# Patient Record
Sex: Female | Born: 1940
Health system: Southern US, Community
[De-identification: ages and names within clinical notes are randomized; demographics above are authoritative.]

## PROBLEM LIST (undated history)

## (undated) DIAGNOSIS — C801 Malignant (primary) neoplasm, unspecified: Secondary | ICD-10-CM

## (undated) DIAGNOSIS — Z9981 Dependence on supplemental oxygen: Secondary | ICD-10-CM

## (undated) DIAGNOSIS — M1990 Unspecified osteoarthritis, unspecified site: Secondary | ICD-10-CM

## (undated) DIAGNOSIS — I4891 Unspecified atrial fibrillation: Secondary | ICD-10-CM

## (undated) DIAGNOSIS — R011 Cardiac murmur, unspecified: Secondary | ICD-10-CM

## (undated) DIAGNOSIS — I272 Pulmonary hypertension, unspecified: Secondary | ICD-10-CM

## (undated) DIAGNOSIS — D696 Thrombocytopenia, unspecified: Secondary | ICD-10-CM

## (undated) DIAGNOSIS — I499 Cardiac arrhythmia, unspecified: Secondary | ICD-10-CM

## (undated) DIAGNOSIS — J302 Other seasonal allergic rhinitis: Secondary | ICD-10-CM

## (undated) DIAGNOSIS — I509 Heart failure, unspecified: Secondary | ICD-10-CM

## (undated) HISTORY — PX: BREAST LUMPECTOMY: SHX2

## (undated) HISTORY — DX: Pulmonary hypertension, unspecified: I27.20

## (undated) HISTORY — PX: VAGINAL HYSTERECTOMY: SUR661

---

## 1998-02-11 ENCOUNTER — Ambulatory Visit (HOSPITAL_COMMUNITY): Admission: RE | Admit: 1998-02-11 | Discharge: 1998-02-11 | Payer: Self-pay | Admitting: Endocrinology

## 1998-02-15 ENCOUNTER — Ambulatory Visit (HOSPITAL_COMMUNITY): Admission: RE | Admit: 1998-02-15 | Discharge: 1998-02-15 | Payer: Self-pay | Admitting: Endocrinology

## 1998-07-15 ENCOUNTER — Other Ambulatory Visit: Admission: RE | Admit: 1998-07-15 | Discharge: 1998-07-15 | Payer: Self-pay | Admitting: Endocrinology

## 1999-07-02 ENCOUNTER — Ambulatory Visit (HOSPITAL_COMMUNITY): Admission: RE | Admit: 1999-07-02 | Discharge: 1999-07-02 | Payer: Self-pay | Admitting: Endocrinology

## 1999-07-02 ENCOUNTER — Encounter: Payer: Self-pay | Admitting: Endocrinology

## 1999-07-24 ENCOUNTER — Other Ambulatory Visit: Admission: RE | Admit: 1999-07-24 | Discharge: 1999-07-24 | Payer: Self-pay | Admitting: Endocrinology

## 2000-07-28 ENCOUNTER — Other Ambulatory Visit: Admission: RE | Admit: 2000-07-28 | Discharge: 2000-07-28 | Payer: Self-pay | Admitting: Endocrinology

## 2002-01-10 ENCOUNTER — Encounter: Payer: Self-pay | Admitting: Endocrinology

## 2002-01-10 ENCOUNTER — Ambulatory Visit (HOSPITAL_COMMUNITY): Admission: RE | Admit: 2002-01-10 | Discharge: 2002-01-10 | Payer: Self-pay | Admitting: Endocrinology

## 2002-01-12 ENCOUNTER — Encounter: Payer: Self-pay | Admitting: Endocrinology

## 2002-01-12 ENCOUNTER — Encounter: Admission: RE | Admit: 2002-01-12 | Discharge: 2002-01-12 | Payer: Self-pay | Admitting: Endocrinology

## 2002-06-29 HISTORY — PX: NM MYOCAR PERF WALL MOTION: HXRAD629

## 2005-05-01 ENCOUNTER — Ambulatory Visit (HOSPITAL_COMMUNITY): Admission: RE | Admit: 2005-05-01 | Discharge: 2005-05-01 | Payer: Self-pay | Admitting: *Deleted

## 2006-05-24 ENCOUNTER — Ambulatory Visit (HOSPITAL_COMMUNITY): Admission: RE | Admit: 2006-05-24 | Discharge: 2006-05-24 | Payer: Self-pay | Admitting: Endocrinology

## 2006-08-10 ENCOUNTER — Inpatient Hospital Stay (HOSPITAL_COMMUNITY): Admission: EM | Admit: 2006-08-10 | Discharge: 2006-08-17 | Payer: Self-pay | Admitting: Emergency Medicine

## 2006-08-12 HISTORY — PX: CARDIAC CATHETERIZATION: SHX172

## 2007-04-04 ENCOUNTER — Encounter: Admission: RE | Admit: 2007-04-04 | Discharge: 2007-05-10 | Payer: Self-pay

## 2010-11-14 NOTE — Op Note (Signed)
NAME:  Vicki [name], CUNNING NO.:  [id number]   MEDICAL RECORD NO.:  [id number]          PATIENT TYPE:  AMB   LOCATION:  ENDO                         FACILITY:  [facility]   PHYSICIAN:  Waverly Ferrari, M.D.    DATE OF BIRTH:  [birth year]   DATE OF PROCEDURE:  05/01/2005  DATE OF DISCHARGE:                                 OPERATIVE REPORT   PROCEDURE:  Colonoscopy.   INDICATIONS:  Colon polyp, rectal bleeding.   ANESTHESIA:  1.  Demerol 50 mg.  2.  Versed 5 mg.   DESCRIPTION OF PROCEDURE:  With patient mildly sedated in the left lateral  decubitus position, the Olympus videoscopic colonoscope was inserted in the  rectum and with pressure applied to the abdomen, we reached the cecum,  identified by ileocecal valve and appendiceal orifice, both of which were  photographed.  From this point, the colonoscope was slowly withdrawn, taking  circumferential views of colonic mucosa, stopping in the rectum which  appeared normal on direct and retroflexed view.  The endoscope was  straightened, withdrawn.  The patient's vital signs and pulse oximeter  remained stable.  The patient tolerated the procedure well without apparent  complications.   FINDINGS:  Occasional diverticula in the sigmoid colon.  Otherwise, an  unremarkable examination.   PLAN:  Consider repeat examination possibly in 5 years.           ______________________________  Waverly Ferrari, M.D.     GMO/MEDQ  D:  05/01/2005  T:  05/01/2005  Job:  [id number]

## 2010-11-14 NOTE — Cardiovascular Report (Signed)
NAME:  Vicki [name], Vicki [name] NO.:  [id number]   MEDICAL RECORD NO.:  [id number]          PATIENT TYPE:  INP   LOCATION:  4703                         FACILITY:  [facility]   PHYSICIAN:  Jeanella Craze. Little, M.D. DATE OF BIRTH:  [birth year]   DATE OF PROCEDURE:  08/12/2006  DATE OF DISCHARGE:                            CARDIAC CATHETERIZATION   INDICATIONS FOR TEST:  This 70 year old female has chronic atrial  fibrillation and known left ventricular hypertrophy.  She was admitted  with chest pain, has ruled out for myocardial infarction and is brought  to the catheterization lab for cardiac catheterization.  Because of her  atrial fibrillation, we had to wait until INR became less than 1.7 to  safely catheterize her.   After obtaining informed consent, the patient was prepped and draped in  the usual sterile fashion exposing the right groin.  Applying local  anesthetic with 1% Xylocaine, the Seldinger technique was employed, and  a 5-French introducer sheath was placed into the right femoral artery.  Left and right coronary arteriography and ventriculography in the RAO  projection was performed.   COMPLICATIONS:  None.   TOTAL CONTRAST:  90 mL.   EQUIPMENT:  5-French Judkins configuration catheters.   RESULTS:  HEMODYNAMIC MONITORING:  Central aortic pressure was 136/72.  Left ventricular pressure was 137/-1.  There was no valve gradient noted  at the time of pullback.   VENTRICULOGRAPHY:  Ventriculography in the RAO projection using 20 mL of  contrast at 12 mL per second revealed severe left ventricular  hypertrophy with a small LV cavity.  There was not obliteration of the  proximal cavity, but the entire LV cavity was almost completely  obliterated.  The ejection fraction was well in excess of 70%, and the  left ventricular end-diastolic pressure was only 8.   CORONARY ARTERIOGRAPHY:  On fluoroscopy, there was no calcification  noted.   1. Left main:  Normal.  It  trifurcated.  2. Optional diagonal:  This was a 4+ mm vessel.  It was large and free      of disease.  3. Circumflex.  The circumflex was around a 3.5-mm vessel that gave      rise to about a 3-mm small OM vessel.  It was free of disease.  4. Left anterior descending:  The LAD extended down to the apex of the      heart and gave rise to large first diagonal vessel.  Both the LAD      and the first diagonal were free of disease and around 4 mm in      diameter.  The right coronary artery was a large dominant vessel      with a very large PDA and posterior lateral vessel, all of which      were free of disease.  The proximal RCA was at least 6 mm in      diameter, and the distal portion was at least 5 mm.   CONCLUSION:  1. No evidence of occlusive coronary disease.  2. Marked left ventricular hypertrophy consistent with a hypertrophic  myopathy.   The patient is in chronic atrial fibrillation.  She will be restarted on  her Coumadin and restarted on Lovenox.  If the patient's insurance will  allow and the patient can be educated on how to give herself  subcutaneous Lovenox, she could be discharged to home later today with  follow-up by Dr. Pauline Aus from a Coumadin standpoint.   I did discuss with Vicki [name] the possibility that her LVH could be  familial and suggested she have her family, particularly her children  checked, to make sure they do not have hypertrophic myopathy.           ______________________________  Jeanella Craze Little, M.D.     ABL/MEDQ  D:  08/12/2006  T:  08/12/2006  Job:  [id number]   cc:   Ronaldo Miyamoto, M.D.  Freeman Caldron. Pauline Aus, M.D.  Jeanella Craze. Little, M.D.  Catheterization Lab

## 2010-11-14 NOTE — Discharge Summary (Signed)
NAME:  Vicki [name], Vicki [name] NO.:  [id number]   MEDICAL RECORD NO.:  [id number]          PATIENT TYPE:  INP   LOCATION:  4703                         FACILITY:  [facility]   PHYSICIAN:  Freeman Caldron. Pauline Aus, M.D.   DATE OF BIRTH:  December 29, 1940   DATE OF ADMISSION:  08/10/2006  DATE OF DISCHARGE:  08/17/2006                               DISCHARGE SUMMARY   This 70 year old woman presented to the office with a chief complaint of  recurrent chest pain associated with physical exertion, and much more  frequent over the week or so prior to hospitalization.  She was noted to  have a markedly abnormal electrocardiogram, and urgent hospitalization  was considered appropriate.  On the electrocardiogram, she had an  intraventricular conduction delay, left ventricular hypertrophy by  voltage criteria, and marked T wave inversions in the upper limb leads  and lateral precordial leads, both of which were considerably more  prominent than had been the case in previous EKGs.   PAST MEDICAL FAMILY AND SOCIAL HISTORY:  Are described in the History &  Physical.  She has no use of tobacco.   PHYSICAL EXAM:  VITAL SIGNS:  Blood pressure 100/64, pulse 80 and  regular, respirations 16 and unlabored, weight 185 pounds.  She was a  well-developed, well-nourished, late middle-aged woman in no acute  distress, moderately overweight.  She is oriented to person, place and  time.  GENERAL PHYSICAL EXAM:  Unremarkable.  LUNGS:  Clear to auscultation and percussion.  Her cardiac apical  impulse was cryptic.  The left border of cardiac dullness was obscure.  HEART RHYTHM:  Irregularly regular.  There was no gallop, click, or  murmur.   INITIAL LABORATORY DATA:  Comprehensive metabolic panel shows a  potassium of 3.4, creatinine 0.6, otherwise unremarkable.  Hemoglobin  12.7, hematocrit 38, mean cell volume 92, platelets 240,000.  The  differential white count was unremarkable.  CK total 239, MB band 8.4,  troponin 0.05, prothrombin INR 2.2.  Urinalysis was unremarkable,  myoglobin 101.  The electrocardiogram showed atrial fibrillation (she  has known chronic atrial fibrillation), with left axis deviation, minor  intraventricular conduction delay, left ventricular hypertrophy by  voltage criteria, and the more marked T wave abnormality noted above.   COURSE IN HOSPITAL:  She was admitted to a telemetry bed.  She was seen  in consultation by [facility], who agreed that the  cardiac catheterization was appropriate in light of her markedly  accelerated symptoms.  Because of her prolonged prothrombin time,  cardiac catheterization had to be delayed by several days.  On August 12, 2006, cardiac catheterization was done by Dr. Rex Kras.  The results  were as follows:  1) Left main, normal with trifurcation, 2) Optional  diagonal was a 4 mm plus vessel, large and free of disease, 3)  Circumflex, the circumflex was a 3.5 mm vessel giving rise to a 3.3 mm  small obtuse marginal vessel.  It was free of disease.  Left anterior  descending extended to the apex of the heart and gave rise to  a large  first diagonal vessel,  both the LAD and the first diagonal were free of  disease and about 4 mm in diameter.  The right coronary was a large  dominant vessel with a very large posterior descending and  posterolateral vessel, all free of disease.  The proximal RCA was at  least 6 mm in diameter and the distal at least 5.  Dr. Rex Kras also noted  that the patient had marked left ventricular hypertrophy consistent with  a hypertrophic cardiomyopathy, but no evidence of dynamic obstruction.   Following the cardiac catheterization, the patient had slow response to  Coumadin in terms of getting her prothrombin time back to the  therapeutic level.  She also had 1-2 episodes of rapid heart beat, still  in atrial fibrillation, which appeared to reproduce the symptoms she had  had prior to  admission.  Because of this, her metoprolol dose was  increased to the maximum tolerable dose of 125 mg twice a day.  She did  have occasional pauses of up to 2.5 seconds, but these were  asymptomatic.  Her resting electrocardiogram demonstrated QT  prolongation, and some more active antiarrhythmic drugs were considered  to be at least relatively contraindicated.   A discussion was held with the patient to the effect that she may  eventually need a pacemaker if we are unable to control her symptoms of  rapid heart rate without producing excessive bradycardia.   By the day of discharge, August 17, 2006, her prothrombin time had  finally reached therapeutic level of INR 2, and she was considered ready  for discharge.   FINAL DIAGNOSES:  1. Recurrent chest pain, apparently due to hypertrophic cardiomyopathy      and atrial fibrillation with rapid rate, with normal coronary      arteries on cardiac catheterization.  2. Atrial fibrillation.  3. Hypertension.  4. Mild anemia noted during hospitalization.  Her initial hemoglobin      of 12.7 fell to 11.6, where it remained stable.   CONDITION ON DISCHARGE:  Improved.   RETURN TO WORK:  She may return to work in a week.   RESTRICTIONS:  She is not to do things which cause her to be short of  breath or feel as though she has a fast heart rate.  She can walk as  tolerated.   MEDICATIONS ON DISCHARGE:  1. Altace 10 mg daily.  2. Coumadin 5 mg daily and 7.5 on Tuesdays.  3. Exforge 10/320 one daily.  4. Lopressor 100 plus 25 mg twice a day.  5. Crestor 10 mg daily.  6. Hydrochlorothiazide 25 mg daily.   OPERATIONS:  Cardiac catheterization, as noted.   COMPLICATIONS:  None.   CONDITION ON DISCHARGE:  Stable and improved.   FOLLOWUP:  1. Dr. Pauline Aus in 2 weeks.  2. Dr. Wilson Singer, her primary physician, to be arranged.           ______________________________  Freeman Caldron Pauline Aus, M.D.    DDG/MEDQ  D:  08/17/2006  T:  08/17/2006   Job:  [id number]   cc:   Ronaldo Miyamoto, M.D.

## 2011-07-07 DIAGNOSIS — H2510 Age-related nuclear cataract, unspecified eye: Secondary | ICD-10-CM | POA: Diagnosis not present

## 2011-07-07 DIAGNOSIS — H40029 Open angle with borderline findings, high risk, unspecified eye: Secondary | ICD-10-CM | POA: Diagnosis not present

## 2011-07-27 DIAGNOSIS — Z7901 Long term (current) use of anticoagulants: Secondary | ICD-10-CM | POA: Diagnosis not present

## 2011-07-27 DIAGNOSIS — I4891 Unspecified atrial fibrillation: Secondary | ICD-10-CM | POA: Diagnosis not present

## 2011-07-28 DIAGNOSIS — I10 Essential (primary) hypertension: Secondary | ICD-10-CM | POA: Diagnosis not present

## 2011-07-28 DIAGNOSIS — I4891 Unspecified atrial fibrillation: Secondary | ICD-10-CM | POA: Diagnosis not present

## 2011-08-17 DIAGNOSIS — E789 Disorder of lipoprotein metabolism, unspecified: Secondary | ICD-10-CM | POA: Diagnosis not present

## 2011-08-24 DIAGNOSIS — R092 Respiratory arrest: Secondary | ICD-10-CM | POA: Diagnosis not present

## 2011-08-24 DIAGNOSIS — E789 Disorder of lipoprotein metabolism, unspecified: Secondary | ICD-10-CM | POA: Diagnosis not present

## 2011-08-24 DIAGNOSIS — I10 Essential (primary) hypertension: Secondary | ICD-10-CM | POA: Diagnosis not present

## 2011-08-27 DIAGNOSIS — I4891 Unspecified atrial fibrillation: Secondary | ICD-10-CM | POA: Diagnosis not present

## 2011-08-27 DIAGNOSIS — I509 Heart failure, unspecified: Secondary | ICD-10-CM | POA: Diagnosis not present

## 2011-08-27 DIAGNOSIS — Z7901 Long term (current) use of anticoagulants: Secondary | ICD-10-CM | POA: Diagnosis not present

## 2011-09-24 DIAGNOSIS — Z7901 Long term (current) use of anticoagulants: Secondary | ICD-10-CM | POA: Diagnosis not present

## 2011-09-24 DIAGNOSIS — I4891 Unspecified atrial fibrillation: Secondary | ICD-10-CM | POA: Diagnosis not present

## 2011-10-05 DIAGNOSIS — I10 Essential (primary) hypertension: Secondary | ICD-10-CM | POA: Diagnosis not present

## 2011-10-05 DIAGNOSIS — E119 Type 2 diabetes mellitus without complications: Secondary | ICD-10-CM | POA: Diagnosis not present

## 2011-10-05 DIAGNOSIS — R05 Cough: Secondary | ICD-10-CM | POA: Diagnosis not present

## 2011-10-05 DIAGNOSIS — R059 Cough, unspecified: Secondary | ICD-10-CM | POA: Diagnosis not present

## 2011-10-05 DIAGNOSIS — E789 Disorder of lipoprotein metabolism, unspecified: Secondary | ICD-10-CM | POA: Diagnosis not present

## 2011-10-27 DIAGNOSIS — I10 Essential (primary) hypertension: Secondary | ICD-10-CM | POA: Diagnosis not present

## 2011-10-27 DIAGNOSIS — I4891 Unspecified atrial fibrillation: Secondary | ICD-10-CM | POA: Diagnosis not present

## 2011-10-27 DIAGNOSIS — Z7901 Long term (current) use of anticoagulants: Secondary | ICD-10-CM | POA: Diagnosis not present

## 2011-11-10 DIAGNOSIS — I4891 Unspecified atrial fibrillation: Secondary | ICD-10-CM | POA: Diagnosis not present

## 2011-11-10 DIAGNOSIS — I10 Essential (primary) hypertension: Secondary | ICD-10-CM | POA: Diagnosis not present

## 2011-11-10 DIAGNOSIS — Z7901 Long term (current) use of anticoagulants: Secondary | ICD-10-CM | POA: Diagnosis not present

## 2011-12-15 DIAGNOSIS — Z7901 Long term (current) use of anticoagulants: Secondary | ICD-10-CM | POA: Diagnosis not present

## 2011-12-15 DIAGNOSIS — I10 Essential (primary) hypertension: Secondary | ICD-10-CM | POA: Diagnosis not present

## 2011-12-15 DIAGNOSIS — I4891 Unspecified atrial fibrillation: Secondary | ICD-10-CM | POA: Diagnosis not present

## 2011-12-15 DIAGNOSIS — E789 Disorder of lipoprotein metabolism, unspecified: Secondary | ICD-10-CM | POA: Diagnosis not present

## 2011-12-22 DIAGNOSIS — R0989 Other specified symptoms and signs involving the circulatory and respiratory systems: Secondary | ICD-10-CM | POA: Diagnosis not present

## 2011-12-22 DIAGNOSIS — R0609 Other forms of dyspnea: Secondary | ICD-10-CM | POA: Diagnosis not present

## 2011-12-22 DIAGNOSIS — I10 Essential (primary) hypertension: Secondary | ICD-10-CM | POA: Diagnosis not present

## 2011-12-22 DIAGNOSIS — I4891 Unspecified atrial fibrillation: Secondary | ICD-10-CM | POA: Diagnosis not present

## 2011-12-22 DIAGNOSIS — E789 Disorder of lipoprotein metabolism, unspecified: Secondary | ICD-10-CM | POA: Diagnosis not present

## 2012-01-14 DIAGNOSIS — I10 Essential (primary) hypertension: Secondary | ICD-10-CM | POA: Diagnosis not present

## 2012-01-14 DIAGNOSIS — I4891 Unspecified atrial fibrillation: Secondary | ICD-10-CM | POA: Diagnosis not present

## 2012-01-14 DIAGNOSIS — R609 Edema, unspecified: Secondary | ICD-10-CM | POA: Diagnosis not present

## 2012-01-18 DIAGNOSIS — I10 Essential (primary) hypertension: Secondary | ICD-10-CM | POA: Diagnosis not present

## 2012-01-18 DIAGNOSIS — I4891 Unspecified atrial fibrillation: Secondary | ICD-10-CM | POA: Diagnosis not present

## 2012-01-18 DIAGNOSIS — Z7901 Long term (current) use of anticoagulants: Secondary | ICD-10-CM | POA: Diagnosis not present

## 2012-02-18 DIAGNOSIS — I10 Essential (primary) hypertension: Secondary | ICD-10-CM | POA: Diagnosis not present

## 2012-02-18 DIAGNOSIS — I4891 Unspecified atrial fibrillation: Secondary | ICD-10-CM | POA: Diagnosis not present

## 2012-02-18 DIAGNOSIS — Z7901 Long term (current) use of anticoagulants: Secondary | ICD-10-CM | POA: Diagnosis not present

## 2012-02-18 DIAGNOSIS — I509 Heart failure, unspecified: Secondary | ICD-10-CM | POA: Diagnosis not present

## 2012-03-21 DIAGNOSIS — I10 Essential (primary) hypertension: Secondary | ICD-10-CM | POA: Diagnosis not present

## 2012-03-21 DIAGNOSIS — I4891 Unspecified atrial fibrillation: Secondary | ICD-10-CM | POA: Diagnosis not present

## 2012-03-21 DIAGNOSIS — Z7901 Long term (current) use of anticoagulants: Secondary | ICD-10-CM | POA: Diagnosis not present

## 2012-03-21 DIAGNOSIS — I509 Heart failure, unspecified: Secondary | ICD-10-CM | POA: Diagnosis not present

## 2012-04-04 DIAGNOSIS — Z7901 Long term (current) use of anticoagulants: Secondary | ICD-10-CM | POA: Diagnosis not present

## 2012-04-04 DIAGNOSIS — I4891 Unspecified atrial fibrillation: Secondary | ICD-10-CM | POA: Diagnosis not present

## 2012-04-04 DIAGNOSIS — I509 Heart failure, unspecified: Secondary | ICD-10-CM | POA: Diagnosis not present

## 2012-04-04 DIAGNOSIS — I10 Essential (primary) hypertension: Secondary | ICD-10-CM | POA: Diagnosis not present

## 2012-04-18 DIAGNOSIS — I10 Essential (primary) hypertension: Secondary | ICD-10-CM | POA: Diagnosis not present

## 2012-04-18 DIAGNOSIS — I509 Heart failure, unspecified: Secondary | ICD-10-CM | POA: Diagnosis not present

## 2012-04-18 DIAGNOSIS — E789 Disorder of lipoprotein metabolism, unspecified: Secondary | ICD-10-CM | POA: Diagnosis not present

## 2012-04-25 DIAGNOSIS — I10 Essential (primary) hypertension: Secondary | ICD-10-CM | POA: Diagnosis not present

## 2012-04-25 DIAGNOSIS — J9600 Acute respiratory failure, unspecified whether with hypoxia or hypercapnia: Secondary | ICD-10-CM | POA: Diagnosis not present

## 2012-04-25 DIAGNOSIS — Z23 Encounter for immunization: Secondary | ICD-10-CM | POA: Diagnosis not present

## 2012-04-25 DIAGNOSIS — E789 Disorder of lipoprotein metabolism, unspecified: Secondary | ICD-10-CM | POA: Diagnosis not present

## 2012-04-27 DIAGNOSIS — H6120 Impacted cerumen, unspecified ear: Secondary | ICD-10-CM | POA: Diagnosis not present

## 2012-05-09 DIAGNOSIS — I4891 Unspecified atrial fibrillation: Secondary | ICD-10-CM | POA: Diagnosis not present

## 2012-05-09 DIAGNOSIS — I10 Essential (primary) hypertension: Secondary | ICD-10-CM | POA: Diagnosis not present

## 2012-05-09 DIAGNOSIS — I509 Heart failure, unspecified: Secondary | ICD-10-CM | POA: Diagnosis not present

## 2012-05-09 DIAGNOSIS — Z7901 Long term (current) use of anticoagulants: Secondary | ICD-10-CM | POA: Diagnosis not present

## 2012-05-25 DIAGNOSIS — D696 Thrombocytopenia, unspecified: Secondary | ICD-10-CM | POA: Diagnosis not present

## 2012-06-02 DIAGNOSIS — D696 Thrombocytopenia, unspecified: Secondary | ICD-10-CM | POA: Diagnosis not present

## 2012-06-03 ENCOUNTER — Telehealth: Payer: Self-pay | Admitting: Hematology and Oncology

## 2012-06-03 ENCOUNTER — Telehealth: Payer: Self-pay | Admitting: Internal Medicine

## 2012-06-03 NOTE — Telephone Encounter (Signed)
S/W pt in re NP appt 12/09 @ 9:30 w/Dr. Julien Nordmann.  Referring Dr. Wilson Singer  Dx-Thrombocytopenia Welcome Packet at registration

## 2012-06-03 NOTE — Telephone Encounter (Signed)
LVOM for pt to return call.

## 2012-06-06 ENCOUNTER — Other Ambulatory Visit: Payer: Self-pay | Admitting: Lab

## 2012-06-06 ENCOUNTER — Telehealth: Payer: Self-pay | Admitting: Internal Medicine

## 2012-06-06 ENCOUNTER — Ambulatory Visit: Payer: Self-pay | Admitting: Internal Medicine

## 2012-06-06 ENCOUNTER — Ambulatory Visit: Payer: Self-pay

## 2012-06-06 NOTE — Telephone Encounter (Signed)
C/D 06/06/12 for appt.06/13/12

## 2012-06-09 DIAGNOSIS — I10 Essential (primary) hypertension: Secondary | ICD-10-CM | POA: Diagnosis not present

## 2012-06-09 DIAGNOSIS — Z7901 Long term (current) use of anticoagulants: Secondary | ICD-10-CM | POA: Diagnosis not present

## 2012-06-09 DIAGNOSIS — I509 Heart failure, unspecified: Secondary | ICD-10-CM | POA: Diagnosis not present

## 2012-06-09 DIAGNOSIS — I4891 Unspecified atrial fibrillation: Secondary | ICD-10-CM | POA: Diagnosis not present

## 2012-06-13 ENCOUNTER — Other Ambulatory Visit (HOSPITAL_BASED_OUTPATIENT_CLINIC_OR_DEPARTMENT_OTHER): Payer: Medicare Other | Admitting: Lab

## 2012-06-13 ENCOUNTER — Ambulatory Visit: Payer: Medicare Other

## 2012-06-13 ENCOUNTER — Encounter: Payer: Self-pay | Admitting: Internal Medicine

## 2012-06-13 ENCOUNTER — Ambulatory Visit (HOSPITAL_BASED_OUTPATIENT_CLINIC_OR_DEPARTMENT_OTHER): Payer: Medicare Other | Admitting: Internal Medicine

## 2012-06-13 VITALS — BP 112/71 | HR 83 | Temp 97.2°F | Resp 22 | Ht 62.0 in | Wt 175.5 lb

## 2012-06-13 DIAGNOSIS — D696 Thrombocytopenia, unspecified: Secondary | ICD-10-CM

## 2012-06-13 LAB — CBC WITH DIFFERENTIAL/PLATELET
BASO%: 0.4 % (ref 0.0–2.0)
Basophils Absolute: 0 10*3/uL (ref 0.0–0.1)
EOS%: 0.8 % (ref 0.0–7.0)
Eosinophils Absolute: 0 10*3/uL (ref 0.0–0.5)
HCT: 45.1 % (ref 34.8–46.6)
HGB: 14.3 g/dL (ref 11.6–15.9)
LYMPH%: 19 % (ref 14.0–49.7)
MCH: 30.4 pg (ref 25.1–34.0)
MCHC: 31.7 g/dL (ref 31.5–36.0)
MCV: 96 fL (ref 79.5–101.0)
MONO#: 0.4 10*3/uL (ref 0.1–0.9)
MONO%: 6.6 % (ref 0.0–14.0)
NEUT#: 3.9 10*3/uL (ref 1.5–6.5)
NEUT%: 73.2 % (ref 38.4–76.8)
Platelets: 133 10*3/uL — ABNORMAL LOW (ref 145–400)
RBC: 4.7 10*6/uL (ref 3.70–5.45)
RDW: 16.7 % — ABNORMAL HIGH (ref 11.2–14.5)
WBC: 5.3 10*3/uL (ref 3.9–10.3)
lymph#: 1 10*3/uL (ref 0.9–3.3)
nRBC: 0 % (ref 0–0)

## 2012-06-13 LAB — COMPREHENSIVE METABOLIC PANEL (CC13)
ALT: 21 U/L (ref 0–55)
Albumin: 3.3 g/dL — ABNORMAL LOW (ref 3.5–5.0)
CO2: 29 mEq/L (ref 22–29)
Chloride: 105 mEq/L (ref 98–107)
Glucose: 139 mg/dl — ABNORMAL HIGH (ref 70–99)
Potassium: 3.5 mEq/L (ref 3.5–5.1)
Sodium: 142 mEq/L (ref 136–145)
Total Protein: 6.8 g/dL (ref 6.4–8.3)

## 2012-06-13 LAB — LACTATE DEHYDROGENASE (CC13): LDH: 360 U/L — ABNORMAL HIGH (ref 125–245)

## 2012-06-13 NOTE — Progress Notes (Signed)
[place] [phone]   [phone]  CONSULT NOTE   REFERRING PHYSICIAN: Anda Kraft, MD  REASON FOR CONSULTATION: Thrombocytopenia.  HPI Vicki [name] is a 71 y.o. female with past medical history significant for hypertension, congestive heart failure, dyslipidemia and atrial fibrillation. The patient is currently on Coumadin for her atrial fibrillation. She was seen recently by her primary care physician Dr. Wilson Singer for routine evaluation and blood work. CBC on 04/18/2012 showed platelets count was low at 116,000. Repeat CBC on 05/25/2012 showed low platelets count of 83,000. CBC was again repeated on 06/02/2012 and showed platelets count to over 59,000. He referred the patient to me today for evaluation of this abnormality. The patient is feeling fine and she has no significant complaints except for shortness of breath at baseline and increased with exertion secondary to congestive heart failure. She denied having any bleeding issues, bruises or ecchymosis. She has no weight loss or night sweats. She does take some over-the-counter herbal medications for sleep and multivitamins. There is no recent change in her medications. She has been on Crestor for almost 7 years but she has been using more Lasix recently because of the congestive heart failure.  The patient mentions that she has low platelets for more than 10 years. The patient has no family history of thrombocytopenia or any other blood disease.  Past medical history: Significant for atrial fibrillation, dyslipidemia, hypertension, congestive heart failure.  Family history: Mother died from on Zymar and father from heart disease.  Social History: The patient is married and has 3 children. 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 She has no history of smoking but drinks alcohol occasionally and no history of drug abuse.   No Known Allergies  Current Outpatient  Prescriptions  Medication Sig Dispense Refill  . amLODipine (NORVASC) 10 MG tablet Take 10 mg by mouth daily.      . furosemide (LASIX) 40 MG tablet Take 40 mg by mouth daily.      Marland Kitchen losartan (COZAAR) 100 MG tablet Take 100 mg by mouth daily.      . metoprolol (LOPRESSOR) 100 MG tablet Take 100 mg by mouth 2 (two) times daily.      . potassium chloride (K-DUR) 10 MEQ tablet Take 20 mEq by mouth daily.      . ramipril (ALTACE) 10 MG capsule Take 10 mg by mouth daily.      . rosuvastatin (CRESTOR) 20 MG tablet Take 20 mg by mouth daily.      Marland Kitchen warfarin (COUMADIN) 5 MG tablet Take 5 mg by mouth daily. As directed by PCP        Review of Systems  A comprehensive review of systems was negative except for: Respiratory: positive for dyspnea on exertion  Physical Exam  QDI:YMEBR, healthy, no distress, well nourished and well developed SKIN: skin color, texture, turgor are normal HEAD: Normocephalic EYES: normal, PERRLA EARS: External ears normal OROPHARYNX:no exudate and no erythema  NECK: supple, no adenopathy LYMPH:  no palpable lymphadenopathy, no hepatosplenomegaly BREAST:not examined LUNGS: clear to auscultation  HEART: regular rate & rhythm and no murmurs ABDOMEN:abdomen soft, non-tender, obese, normal bowel sounds and no masses or organomegaly BACK: Back symmetric, no curvature. EXTREMITIES:no joint deformities, effusion, or inflammation, no edema, no skin discoloration  NEURO: alert & oriented x 3 with fluent speech, no focal motor/sensory deficits  PERFORMANCE STATUS: ECOG 1  LABORATORY DATA: Lab Results  Component Value Date   WBC 5.3 06/13/2012   HGB 14.3 06/13/2012   HCT 45.1 06/13/2012   MCV 96.0 06/13/2012   PLT 133 Giant platelets present* 06/13/2012      Chemistry      Component Value Date/Time   NA 142 06/13/2012 1345   K 3.5 06/13/2012 1345   CL 105 06/13/2012 1345   CO2 29 06/13/2012 1345   BUN 16.0 06/13/2012 1345   CREATININE 1.0 06/13/2012 1345       Component Value Date/Time   CALCIUM 8.9 06/13/2012 1345   ALKPHOS 45 06/13/2012 1345   AST 34 06/13/2012 1345   ALT 21 06/13/2012 1345   BILITOT 1.69* 06/13/2012 1345       RADIOGRAPHIC STUDIES: No results found.  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 Her platelets has been up and down for the last 10 years but the patient is asymptomatic with no bleeding, bruises or ecchymosis.  Her CBC today showed platelets count of 133,000 with giant platelets.  PLAN: I discussed the lab result with the patient today. I recommended for her to continue on observation with her primary care physician. I don't see a need for any further intervention at this point. I recommended for the patient to call me immediately if she has platelets count less than 50,000 or if she has any significant bleeding, bruises or ecchymosis. The patient agreed to the current plan.  All questions were answered. The patient knows to call the clinic with any problems, questions or concerns. We can certainly see the patient much sooner if necessary.  Thank you so much for allowing me to participate in the care of Vicki [name]. I will continue to follow up the patient with you and assist in her care.  I spent 25 minutes counseling the patient face to face. The total time spent in the appointment was 50 minutes.  Hawkins Seaman K. 06/13/2012, 4:31 PM

## 2012-06-13 NOTE — Patient Instructions (Signed)
Your CBC today showed mildly decreased platelets count. This is most likely drug-induced plus/minus ITP. Continue observation by your family physician. Call back if you have any platelets count less than 50,000 or you have any significant bleeding, bruises or ecchymosis

## 2012-06-13 NOTE — Progress Notes (Signed)
Checked in new patient. No financial issues.

## 2012-07-07 DIAGNOSIS — Z7901 Long term (current) use of anticoagulants: Secondary | ICD-10-CM | POA: Diagnosis not present

## 2012-07-07 DIAGNOSIS — I4891 Unspecified atrial fibrillation: Secondary | ICD-10-CM | POA: Diagnosis not present

## 2012-07-07 DIAGNOSIS — I10 Essential (primary) hypertension: Secondary | ICD-10-CM | POA: Diagnosis not present

## 2012-07-12 DIAGNOSIS — H2510 Age-related nuclear cataract, unspecified eye: Secondary | ICD-10-CM | POA: Diagnosis not present

## 2012-07-12 DIAGNOSIS — H40029 Open angle with borderline findings, high risk, unspecified eye: Secondary | ICD-10-CM | POA: Diagnosis not present

## 2012-07-13 DIAGNOSIS — I422 Other hypertrophic cardiomyopathy: Secondary | ICD-10-CM | POA: Diagnosis not present

## 2012-07-13 DIAGNOSIS — I4891 Unspecified atrial fibrillation: Secondary | ICD-10-CM | POA: Diagnosis not present

## 2012-07-13 DIAGNOSIS — I5031 Acute diastolic (congestive) heart failure: Secondary | ICD-10-CM | POA: Diagnosis not present

## 2012-07-13 DIAGNOSIS — I10 Essential (primary) hypertension: Secondary | ICD-10-CM | POA: Diagnosis not present

## 2012-07-26 DIAGNOSIS — R05 Cough: Secondary | ICD-10-CM | POA: Diagnosis not present

## 2012-07-26 DIAGNOSIS — I10 Essential (primary) hypertension: Secondary | ICD-10-CM | POA: Diagnosis not present

## 2012-07-26 DIAGNOSIS — R059 Cough, unspecified: Secondary | ICD-10-CM | POA: Diagnosis not present

## 2012-07-26 DIAGNOSIS — E789 Disorder of lipoprotein metabolism, unspecified: Secondary | ICD-10-CM | POA: Diagnosis not present

## 2012-07-26 DIAGNOSIS — R0602 Shortness of breath: Secondary | ICD-10-CM | POA: Diagnosis not present

## 2012-08-03 ENCOUNTER — Other Ambulatory Visit (HOSPITAL_COMMUNITY): Payer: Self-pay | Admitting: Cardiology

## 2012-08-03 DIAGNOSIS — R0600 Dyspnea, unspecified: Secondary | ICD-10-CM

## 2012-08-03 DIAGNOSIS — R0602 Shortness of breath: Secondary | ICD-10-CM

## 2012-08-03 DIAGNOSIS — R609 Edema, unspecified: Secondary | ICD-10-CM

## 2012-08-08 DIAGNOSIS — I10 Essential (primary) hypertension: Secondary | ICD-10-CM | POA: Diagnosis not present

## 2012-08-08 DIAGNOSIS — Z7901 Long term (current) use of anticoagulants: Secondary | ICD-10-CM | POA: Diagnosis not present

## 2012-08-08 DIAGNOSIS — I509 Heart failure, unspecified: Secondary | ICD-10-CM | POA: Diagnosis not present

## 2012-08-08 DIAGNOSIS — I4891 Unspecified atrial fibrillation: Secondary | ICD-10-CM | POA: Diagnosis not present

## 2012-08-09 ENCOUNTER — Ambulatory Visit (HOSPITAL_COMMUNITY)
Admission: RE | Admit: 2012-08-09 | Discharge: 2012-08-09 | Disposition: A | Discharge status: Home / Self Care | Payer: Medicare Other | Source: Ambulatory Visit | Attending: Cardiology | Admitting: Cardiology

## 2012-08-09 DIAGNOSIS — I059 Rheumatic mitral valve disease, unspecified: Secondary | ICD-10-CM | POA: Insufficient documentation

## 2012-08-09 DIAGNOSIS — I319 Disease of pericardium, unspecified: Secondary | ICD-10-CM | POA: Diagnosis not present

## 2012-08-09 DIAGNOSIS — I4891 Unspecified atrial fibrillation: Secondary | ICD-10-CM | POA: Insufficient documentation

## 2012-08-09 DIAGNOSIS — R609 Edema, unspecified: Secondary | ICD-10-CM | POA: Diagnosis not present

## 2012-08-09 DIAGNOSIS — I10 Essential (primary) hypertension: Secondary | ICD-10-CM | POA: Diagnosis not present

## 2012-08-09 DIAGNOSIS — R0602 Shortness of breath: Secondary | ICD-10-CM | POA: Insufficient documentation

## 2012-08-09 DIAGNOSIS — I379 Nonrheumatic pulmonary valve disorder, unspecified: Secondary | ICD-10-CM | POA: Diagnosis not present

## 2012-08-09 DIAGNOSIS — I369 Nonrheumatic tricuspid valve disorder, unspecified: Secondary | ICD-10-CM | POA: Insufficient documentation

## 2012-08-09 DIAGNOSIS — R0600 Dyspnea, unspecified: Secondary | ICD-10-CM

## 2012-08-09 HISTORY — PX: DOPPLER ECHOCARDIOGRAPHY: SHX263

## 2012-08-09 NOTE — Progress Notes (Signed)
[facility]   2D echo completed 08/09/2012.   Jamison Neighbor, RDCS

## 2012-08-18 DIAGNOSIS — I10 Essential (primary) hypertension: Secondary | ICD-10-CM | POA: Diagnosis not present

## 2012-08-18 DIAGNOSIS — E789 Disorder of lipoprotein metabolism, unspecified: Secondary | ICD-10-CM | POA: Diagnosis not present

## 2012-08-18 DIAGNOSIS — D696 Thrombocytopenia, unspecified: Secondary | ICD-10-CM | POA: Diagnosis not present

## 2012-08-25 DIAGNOSIS — I10 Essential (primary) hypertension: Secondary | ICD-10-CM | POA: Diagnosis not present

## 2012-08-25 DIAGNOSIS — J9600 Acute respiratory failure, unspecified whether with hypoxia or hypercapnia: Secondary | ICD-10-CM | POA: Diagnosis not present

## 2012-08-25 DIAGNOSIS — E789 Disorder of lipoprotein metabolism, unspecified: Secondary | ICD-10-CM | POA: Diagnosis not present

## 2012-09-05 DIAGNOSIS — I4891 Unspecified atrial fibrillation: Secondary | ICD-10-CM | POA: Diagnosis not present

## 2012-09-05 DIAGNOSIS — I10 Essential (primary) hypertension: Secondary | ICD-10-CM | POA: Diagnosis not present

## 2012-09-05 DIAGNOSIS — Z7901 Long term (current) use of anticoagulants: Secondary | ICD-10-CM | POA: Diagnosis not present

## 2012-09-05 DIAGNOSIS — I509 Heart failure, unspecified: Secondary | ICD-10-CM | POA: Diagnosis not present

## 2012-10-05 DIAGNOSIS — I270 Primary pulmonary hypertension: Secondary | ICD-10-CM | POA: Diagnosis not present

## 2012-10-05 DIAGNOSIS — E782 Mixed hyperlipidemia: Secondary | ICD-10-CM | POA: Diagnosis not present

## 2012-10-05 DIAGNOSIS — I4891 Unspecified atrial fibrillation: Secondary | ICD-10-CM | POA: Diagnosis not present

## 2012-10-06 DIAGNOSIS — I509 Heart failure, unspecified: Secondary | ICD-10-CM | POA: Diagnosis not present

## 2012-10-06 DIAGNOSIS — I4891 Unspecified atrial fibrillation: Secondary | ICD-10-CM | POA: Diagnosis not present

## 2012-10-06 DIAGNOSIS — I10 Essential (primary) hypertension: Secondary | ICD-10-CM | POA: Diagnosis not present

## 2012-10-06 DIAGNOSIS — Z7901 Long term (current) use of anticoagulants: Secondary | ICD-10-CM | POA: Diagnosis not present

## 2012-10-18 DIAGNOSIS — I4891 Unspecified atrial fibrillation: Secondary | ICD-10-CM | POA: Diagnosis not present

## 2012-10-18 DIAGNOSIS — I270 Primary pulmonary hypertension: Secondary | ICD-10-CM | POA: Diagnosis not present

## 2012-10-20 DIAGNOSIS — R0602 Shortness of breath: Secondary | ICD-10-CM | POA: Diagnosis not present

## 2012-10-20 DIAGNOSIS — Z79899 Other long term (current) drug therapy: Secondary | ICD-10-CM | POA: Diagnosis not present

## 2012-10-25 ENCOUNTER — Other Ambulatory Visit: Payer: Self-pay | Admitting: *Deleted

## 2012-10-25 ENCOUNTER — Encounter: Payer: Self-pay | Admitting: *Deleted

## 2012-10-25 ENCOUNTER — Encounter: Payer: Self-pay | Admitting: Cardiology

## 2012-10-25 ENCOUNTER — Ambulatory Visit (INDEPENDENT_AMBULATORY_CARE_PROVIDER_SITE_OTHER): Payer: Medicare Other | Admitting: Cardiology

## 2012-10-25 VITALS — BP 114/66 | HR 80 | Ht 62.0 in | Wt 171.0 lb

## 2012-10-25 DIAGNOSIS — E876 Hypokalemia: Secondary | ICD-10-CM

## 2012-10-25 DIAGNOSIS — I4891 Unspecified atrial fibrillation: Secondary | ICD-10-CM

## 2012-10-25 DIAGNOSIS — I5032 Chronic diastolic (congestive) heart failure: Secondary | ICD-10-CM

## 2012-10-25 DIAGNOSIS — I509 Heart failure, unspecified: Secondary | ICD-10-CM | POA: Diagnosis not present

## 2012-10-25 DIAGNOSIS — I2789 Other specified pulmonary heart diseases: Secondary | ICD-10-CM

## 2012-10-25 DIAGNOSIS — R0602 Shortness of breath: Secondary | ICD-10-CM | POA: Diagnosis not present

## 2012-10-25 DIAGNOSIS — I272 Pulmonary hypertension, unspecified: Secondary | ICD-10-CM

## 2012-10-25 LAB — CBC WITH DIFFERENTIAL/PLATELET
Basophils Absolute: 0 10*3/uL (ref 0.0–0.1)
Eosinophils Absolute: 0 10*3/uL (ref 0.0–0.7)
Lymphocytes Relative: 15.1 % (ref 12.0–46.0)
Lymphs Abs: 0.8 10*3/uL (ref 0.7–4.0)
MCHC: 31.9 g/dL (ref 30.0–36.0)
Monocytes Relative: 6.9 % (ref 3.0–12.0)
Platelets: 73 10*3/uL — ABNORMAL LOW (ref 150.0–400.0)
RDW: 16.6 % — ABNORMAL HIGH (ref 11.5–14.6)

## 2012-10-25 LAB — BASIC METABOLIC PANEL
CO2: 30 mEq/L (ref 19–32)
Calcium: 9.2 mg/dL (ref 8.4–10.5)
Creatinine, Ser: 1 mg/dL (ref 0.4–1.2)
GFR: 69.37 mL/min (ref >60.00)
Sodium: 140 mEq/L (ref 135–145)

## 2012-10-25 LAB — PROTIME-INR: INR: 2.9 ratio — ABNORMAL HIGH (ref 0.8–1.0)

## 2012-10-25 MED ORDER — FUROSEMIDE 40 MG PO TABS: 40.0000 mg | ORAL_TABLET | Freq: Two times a day (BID) | ORAL | Status: DC

## 2012-10-25 MED ORDER — POTASSIUM CHLORIDE CRYS ER 20 MEQ PO TBCR: 20.0000 meq | EXTENDED_RELEASE_TABLET | Freq: Two times a day (BID) | ORAL | Status: DC

## 2012-10-25 MED ORDER — POTASSIUM CHLORIDE CRYS ER 20 MEQ PO TBCR: EXTENDED_RELEASE_TABLET | ORAL | Status: DC

## 2012-10-25 NOTE — Patient Instructions (Addendum)
Decrease losartan to 62m daily. This will be 1/2 of your 1035mtablet daily.  Increase lasix(furosemide) to 4016mwo times a day.   Increase KCL (potassium) to 20 mEq two times a day.   Your physician recommends that you have  lab work today--BMET/BNP/CBCd/PT/anti SCL70/ANA/serum and urine immunofixation.  Your physician has requested that you have a cardiac catheterization. Cardiac catheterization is used to diagnose and/or treat various heart conditions.  For further information please visit [email or web address] follow instruction sheet, as given. Friday May 2,2014   Your physician has requested that you have a cardiac MRI. Cardiac MRI uses a computer to create images of your heart as its beating, producing both still and moving pictures of your heart and major blood vessels. For further information please visit [email or web address] follow the instruction sheet given to you today for more information.   Your physician recommends that you schedule a follow-up appointment in: 2 weeks with Dr McLAundra Dubinhis is scheduled for Tuesday May 13,2014 at 8:45AM.   Your physician recommends that you return for lab work in: 2 weeks when you see Dr McLChancy Hurter

## 2012-10-25 NOTE — Progress Notes (Signed)
Patient ID: Vicki [name], female   DOB: 09/25/1940, 71 y.o.   MRN: [id number] PCP: Dr. Kohut  71 yo with history of chronic diastolic CHF and chronic atrial fibrillation presents for evaluation of pulmonary hypertension noted on last echo in 2/14.  Patient was followed by Dr. Tysinger in the past for chronic atrial fibrillation.  She has been on coumadin.  More recently, she was referred to Dr. Harding.  She reports progressive exertional dyspnea since 2011.  This has gradually worsened and has become quite significant over the last few months.  She is now short of breath just walking around her house.  She has to walk very slowly.  She is very short of breath with steps and tries not to climb the stairs in her house.  No PND, no chest pain.  She gets occasional lightheadedness with standing.  This has improved but not resolved since cutting back on her amlodipine.  She used to have significant HTN, but more recently her BP has been on the lower side.  Echo was done in 2/14, showing severe concentric LVH with EF 55-60%, moderately dilated RV, moderate to severe TR, and PA systolic pressure 86 mmHg.  Patient has been on Lasix 20 mg bid.     She was referred here today for evaluation of the pulmonary HTN noted on echo.  Is this all related to LV diastolic dysfunction or is there underlying pulmonary arterial HTN that may be treatable by vasodilators?   ECG: atrial fibrillation, LVH with repolarization abnormality  Labs (12/13): HCT 45.1, plts 153, K 3.5, creatinine 1.0 Labs (1/14): BNP 849  PMH: 1. Chronic diastolic CHF: Echo (2/14) with EF 55-60%, severe LVH (no SAM, no asymmetric hypertrophy, no LVOT gradient), moderate-severe LAE, moderately dilated RV with mildly decreased systolic function, moderate to severe RAE, PA systolic pressure 86 mmHg, moderate-severe TR, moderate MR, trivial pericardial effusion.  2. Chronic atrial fibrillation since around 2004.  3. HTN: For decades.  4. LHC (2/08) with no  significant disease.  5. Chronic thrombocytopenia  SH: Married, retired from a bank, 3 children, lives in [place].   FH: No heart disease, +HTN.  ROS: All systems reviewed and negative except as per HPI.   Current Outpatient Prescriptions  Medication Sig Dispense Refill  . amLODipine (NORVASC) 5 MG tablet Take 5 mg by mouth daily.      . metoprolol (LOPRESSOR) 50 MG tablet Take 75 mg by mouth 2 (two) times daily.      . rosuvastatin (CRESTOR) 20 MG tablet Take 20 mg by mouth daily.      . furosemide (LASIX) 40 MG tablet Take 1 tablet (40 mg total) by mouth 2 (two) times daily.  60 tablet  3  . losartan (COZAAR) 100 MG tablet 1/2 tablet (total 50mg) daily      . potassium chloride SA (K-DUR,KLOR-CON) 20 MEQ tablet 2 tablets(total 40 mEq) in the AM and 1 tablet in the PM  90 tablet  6  . warfarin (COUMADIN) 5 MG tablet Take 5 mg by mouth daily. 5 mg Monday and Friday. 2.5 mg every Tuesday, Wednesday,  Thursday, Saturday, Sunday.       No current facility-administered medications for this visit.    BP 114/66  Pulse 80  Ht 5' 2" (1.575 m)  Wt 171 lb (77.565 kg)  BMI 31.27 kg/m2  SpO2 86% General: NAD Neck: JVP 12 cm, no thyromegaly or thyroid nodule.  Lungs: Clear to auscultation bilaterally with normal respiratory effort. CV:   Nondisplaced PMI.  Heart regular S1/S2, suspect right-sided S3, 2/6 HSM LLSB.  1+ edema 1/2 up lower legs bilaterally.  No carotid bruit.  Normal pedal pulses.  Abdomen: Soft, nontender, no hepatosplenomegaly, no distention.  Skin: Intact without lesions or rashes.  Neurologic: Alert and oriented x 3.  Psych: Normal affect. Extremities: No clubbing or cyanosis.  HEENT: Normal.   Assessment/Plan: 1. Pulmonary HTN: Patient had significantly elevated PA pressure estimation by echo.  Question here is whether the pulmonary hypertension is primarily pulmonary venous HTN from LV pressure/volume overload (due to diastolic CHF) or whether there is a component of  pulmonary arterial hypertension that may be treatable by pulmonary vasodilators.  It is possible also that long-standing PCWP elevation from diastolic dysfunction could lead to pulmonary vascular changes with pulmonary arterial hypertension out of proportion to the PCWP elevation.  RHC will be needed to differentiate.   - Plan for RHC Friday.  She will stop her coumadin today.  - Begin immunological workup for pulmonary HTN with ANA, anti-SCL70. If there is significant PAH on RHC, will need to arrange sleep study and V/Q scan.  2. Chronic diastolic CHF: EF preserved on echo with severe LVH.  The LVH is relatively concentric.  It is possible that the LVH is due to years of HTN (has been hypertensive since her 30s.  However, I would also consider the possibility of cardiac amyloidosis given severe LV hypertrophy.  Mrs Helle is volume overloaded today with elevated neck veins.  I do not think that JVP elevation here is fully explainable by the moderate to severe TR alone.  - Serum/urine immunofixation for AL amyloidosis (though she could alternatively have transthyretin amyloid).   - Cardiac MRI to assess for myocardial infiltration.  - Increase Lasix to 40 mg bid, increase KCl to 40 qam, 20 qpm.  BMET/BNP in 1 week. 3. Lightheadedness: Decrease losartan to 50 mg daily.  4. Chronic atrial fibrillation: She is on metoprolol and coumadin.  Coumadin to be held pending RHC on Friday.    Followup in 2 wks.   Teegan Brandis 10/26/2012  

## 2012-10-26 DIAGNOSIS — I5032 Chronic diastolic (congestive) heart failure: Secondary | ICD-10-CM | POA: Insufficient documentation

## 2012-10-26 DIAGNOSIS — I4891 Unspecified atrial fibrillation: Secondary | ICD-10-CM | POA: Insufficient documentation

## 2012-10-26 DIAGNOSIS — I272 Pulmonary hypertension, unspecified: Secondary | ICD-10-CM | POA: Insufficient documentation

## 2012-10-26 LAB — ANA: Anti Nuclear Antibody(ANA): NEGATIVE

## 2012-10-26 LAB — ANTI-SCLERODERMA ANTIBODY: Scleroderma (Scl-70) (ENA) Antibody, IgG: <1 AU/mL (ref <30)

## 2012-10-27 ENCOUNTER — Telehealth: Payer: Self-pay | Admitting: Cardiology

## 2012-10-27 LAB — IMMUNOFIXATION ELECTROPHORESIS
IgA: 437 mg/dL — ABNORMAL HIGH (ref 69–380)
IgM, Serum: 112 mg/dL (ref 52–322)

## 2012-10-27 NOTE — Telephone Encounter (Signed)
No answer 10/27/12 _0 :05

## 2012-10-27 NOTE — Telephone Encounter (Signed)
New Prob     Pt has some questions regarding FUROSEMIDE. Needs some clarification on directions. Please call.

## 2012-10-28 ENCOUNTER — Other Ambulatory Visit: Payer: Self-pay | Admitting: *Deleted

## 2012-10-28 ENCOUNTER — Ambulatory Visit (HOSPITAL_COMMUNITY)
Admit: 2012-10-28 | Discharge: 2012-10-28 | Disposition: A | Discharge status: Home / Self Care | Payer: Medicare Other | Source: Ambulatory Visit | Attending: Cardiology | Admitting: Cardiology

## 2012-10-28 ENCOUNTER — Ambulatory Visit (INDEPENDENT_AMBULATORY_CARE_PROVIDER_SITE_OTHER): Payer: Medicare Other | Admitting: *Deleted

## 2012-10-28 ENCOUNTER — Other Ambulatory Visit: Payer: Self-pay | Admitting: Cardiology

## 2012-10-28 ENCOUNTER — Encounter (HOSPITAL_BASED_OUTPATIENT_CLINIC_OR_DEPARTMENT_OTHER)
Admission: RE | Disposition: A | Discharge status: Home / Self Care | Payer: Self-pay | Source: Ambulatory Visit | Attending: Cardiology

## 2012-10-28 ENCOUNTER — Inpatient Hospital Stay (HOSPITAL_BASED_OUTPATIENT_CLINIC_OR_DEPARTMENT_OTHER)
Admission: RE | Admit: 2012-10-28 | Discharge: 2012-10-28 | Disposition: A | Discharge status: Home / Self Care | Payer: Medicare Other | Source: Ambulatory Visit | Attending: Cardiology | Admitting: Cardiology

## 2012-10-28 ENCOUNTER — Encounter (HOSPITAL_COMMUNITY)
Admit: 2012-10-28 | Discharge: 2012-10-28 | Disposition: A | Discharge status: Home / Self Care | Payer: Medicare Other | Attending: Cardiology | Admitting: Cardiology

## 2012-10-28 DIAGNOSIS — I2789 Other specified pulmonary heart diseases: Secondary | ICD-10-CM | POA: Insufficient documentation

## 2012-10-28 DIAGNOSIS — I272 Pulmonary hypertension, unspecified: Secondary | ICD-10-CM

## 2012-10-28 DIAGNOSIS — R079 Chest pain, unspecified: Secondary | ICD-10-CM | POA: Insufficient documentation

## 2012-10-28 DIAGNOSIS — D696 Thrombocytopenia, unspecified: Secondary | ICD-10-CM | POA: Insufficient documentation

## 2012-10-28 DIAGNOSIS — R0989 Other specified symptoms and signs involving the circulatory and respiratory systems: Secondary | ICD-10-CM | POA: Diagnosis not present

## 2012-10-28 DIAGNOSIS — R0602 Shortness of breath: Secondary | ICD-10-CM | POA: Insufficient documentation

## 2012-10-28 DIAGNOSIS — I5032 Chronic diastolic (congestive) heart failure: Secondary | ICD-10-CM | POA: Insufficient documentation

## 2012-10-28 DIAGNOSIS — I4891 Unspecified atrial fibrillation: Secondary | ICD-10-CM | POA: Insufficient documentation

## 2012-10-28 DIAGNOSIS — I509 Heart failure, unspecified: Secondary | ICD-10-CM | POA: Diagnosis not present

## 2012-10-28 DIAGNOSIS — I517 Cardiomegaly: Secondary | ICD-10-CM | POA: Insufficient documentation

## 2012-10-28 DIAGNOSIS — R5381 Other malaise: Secondary | ICD-10-CM | POA: Insufficient documentation

## 2012-10-28 DIAGNOSIS — R5383 Other fatigue: Secondary | ICD-10-CM | POA: Insufficient documentation

## 2012-10-28 HISTORY — PX: CARDIAC CATHETERIZATION: SHX172

## 2012-10-28 LAB — POCT I-STAT 3, ART BLOOD GAS (G3+)
Bicarbonate: 27.8 mEq/L — ABNORMAL HIGH (ref 20.0–24.0)
O2 Saturation: 86 %
TCO2: 29 mmol/L (ref 0–100)

## 2012-10-28 LAB — POCT INR: INR: 1.8

## 2012-10-28 SURGERY — JV RIGHT HEART CATHETERIZATION
Anesthesia: Moderate Sedation

## 2012-10-28 MED ORDER — TECHNETIUM TO 99M ALBUMIN AGGREGATED: 6.0000 | Freq: Once | INTRAVENOUS | Status: AC | PRN

## 2012-10-28 MED ORDER — ASPIRIN 81 MG PO CHEW: 324.0000 mg | CHEWABLE_TABLET | ORAL | Status: DC

## 2012-10-28 MED ORDER — SODIUM CHLORIDE 0.9 % IJ SOLN: 3.0000 mL | INTRAMUSCULAR | Status: DC | PRN

## 2012-10-28 MED ORDER — SODIUM CHLORIDE 0.9 % IJ SOLN: 3.0000 mL | Freq: Two times a day (BID) | INTRAMUSCULAR | Status: DC

## 2012-10-28 MED ORDER — ONDANSETRON HCL 4 MG/2ML IJ SOLN: 4.0000 mg | Freq: Four times a day (QID) | INTRAMUSCULAR | Status: DC | PRN

## 2012-10-28 MED ORDER — SODIUM CHLORIDE 0.9 % IV SOLN
250.0000 mL | INTRAVENOUS | Status: DC | PRN
  Administered 2012-10-28: 250 mL via INTRAVENOUS

## 2012-10-28 MED ORDER — TECHNETIUM TC 99M DIETHYLENETRIAME-PENTAACETIC ACID: 40.0000 | Freq: Once | INTRAVENOUS | Status: AC | PRN

## 2012-10-28 MED ORDER — ACETAMINOPHEN 325 MG PO TABS: 650.0000 mg | ORAL_TABLET | ORAL | Status: DC | PRN

## 2012-10-28 MED ORDER — SODIUM CHLORIDE 0.9 % IV SOLN: 250.0000 mL | INTRAVENOUS | Status: DC | PRN

## 2012-10-28 NOTE — Telephone Encounter (Signed)
Patient accidentally took too much Lasix this week on Tuesday and Wednesday.  She thought the Lasix tablet was 64m but it was actually 40 mg. She is on Lasix twice daily. She states that she was "more dizzy than usual" and she "passed a lot of water especially on Tuesday". She has not taken any Lasix yesterday or today. She plans to take Lasix per directions at this time aware that tablet is 40 mg strength. Patient has been taking her K-Dur as prescribed. Denies further problems or concerns at this time. Advised patient I will inform MD of the above information.

## 2012-10-28 NOTE — CV Procedure (Addendum)
   Cardiac Catheterization Procedure Note  Name: Vicki [name] MRN: [id number] DOB: 12/08/1940  Procedure: Right Heart Cath  Indication: CHF, pulmonary HTN on echo.    Procedural Details: The right groin was prepped, draped, and anesthetized with 1% lidocaine. Using the modified Seldinger technique a 5 French sheath was placed in the right femoral vein. A Swan-Ganz catheter was used for the right heart catheterization. Standard protocol was followed for recording of right heart pressures and sampling of oxygen saturations. Fick cardiac output was calculated. Thermodilution cardiac output was done.  There were no immediate procedural complications. The patient was transferred to the post catheterization recovery area for further monitoring.  Procedural Findings: Hemodynamics (mmHg) RA mean 13 RV 102/14 PA 104/39, mean 63 PCWP mean 20 (right), mean 23 (left)  Oxygen saturations: PA 59% AO 86%  Cardiac Output (Fick) 4.1  Cardiac Index (Fick) 2.3  Cardiac Output (Thermodilution) 2.8 Cardiac Index (Thermodilution) 1.6  PVR: 10.4 WU (Fick), 15 WU (Thermo)  Final Conclusions:  Severe pulmonary arterial hypertension with mild increase in PCWP.   CI is decreased, 2.3 by Fick and 1.6 by Thermodilution.  I am going to see how she does on oral therapy initially.  She has been on amlodipine 10 mg daily so vasoreactivity testing was not done (severe pulmonary HTN and already on CCB).  We need to complete testing for pulmonary arterial HTN: V/Q scan, PFTs, sleep study.    In the mean time, she will need to increase her oxygen to 4 L/min and continue furosemide.  I am going to initiate treatment with macitentan initially, likely she will need combination therapy and will proceed after several weeks to Adcirca.  Will repeat RHC on meds, if no improvement may need to move to IV therapy.  Restart coumadin tonight.  [name] 10/28/2012, 11:57 AM

## 2012-10-28 NOTE — Progress Notes (Signed)
Patient desaturates to 84% on 2 liters of oxygen, oxygen increased to 4 liters.  Reinforced purse lip breathing.

## 2012-10-28 NOTE — Interval H&P Note (Signed)
History and Physical Interval Note:  10/28/2012 11:06 AM  Vicki [name]  has presented today for surgery, with the diagnosis of dyspnea  The various methods of treatment have been discussed with the patient and family. After consideration of risks, benefits and other options for treatment, the patient has consented to  Procedure(s): JV RIGHT HEART CATHETERIZATION (N/A) as a surgical intervention .  The patient's history has been reviewed, patient examined, no change in status, stable for surgery.  I have reviewed the patient's chart and labs.  Questions were answered to the patient's satisfaction.     Romin Divita [employer]

## 2012-10-28 NOTE — Patient Instructions (Signed)
Restart post cath per Dr Claris Gladden instruction. Keep appt on 11/03/12 with Dr Glennon Hamilton.

## 2012-10-28 NOTE — H&P (View-Only) (Signed)
Patient ID: Vicki [name], female   DOB: [birth year], 72 y.o.   MRN: [id number] PCP: Dr. Wilson Singer  72 yo with history of chronic diastolic CHF and chronic atrial fibrillation presents for evaluation of pulmonary hypertension noted on last echo in 2/14.  Patient was followed by Dr. Glade Lloyd in the past for chronic atrial fibrillation.  She has been on coumadin.  More recently, she was referred to Dr. Ellyn Hack.  She reports progressive exertional dyspnea since 2011.  This has gradually worsened and has become quite significant over the last few months.  She is now short of breath just walking around her house.  She has to walk very slowly.  She is very short of breath with steps and tries not to climb the stairs in her house.  No PND, no chest pain.  She gets occasional lightheadedness with standing.  This has improved but not resolved since cutting back on her amlodipine.  She used to have significant HTN, but more recently her BP has been on the lower side.  Echo was done in 2/14, showing severe concentric LVH with EF 55-60%, moderately dilated RV, moderate to severe TR, and PA systolic pressure 86 mmHg.  Patient has been on Lasix 20 mg bid.     She was referred here today for evaluation of the pulmonary HTN noted on echo.  Is this all related to LV diastolic dysfunction or is there underlying pulmonary arterial HTN that may be treatable by vasodilators?   ECG: atrial fibrillation, LVH with repolarization abnormality  Labs (12/13): HCT 45.1, plts 153, K 3.5, creatinine 1.0 Labs (1/14): BNP 849  PMH: 1. Chronic diastolic CHF: Echo (9/64) with EF 55-60%, severe LVH (no SAM, no asymmetric hypertrophy, no LVOT gradient), moderate-severe LAE, moderately dilated RV with mildly decreased systolic function, moderate to severe RAE, PA systolic pressure 86 mmHg, moderate-severe TR, moderate MR, trivial pericardial effusion.  2. Chronic atrial fibrillation since around 2004.  3. HTN: For decades.  4. LHC (2/08) with no  significant disease.  5. Chronic thrombocytopenia  SH: Married, retired from a bank, 3 children, lives in [place].   FH: No heart disease, +HTN.  ROS: All systems reviewed and negative except as per HPI.   Current Outpatient Prescriptions  Medication Sig Dispense Refill  . amLODipine (NORVASC) 5 MG tablet Take 5 mg by mouth daily.      . metoprolol (LOPRESSOR) 50 MG tablet Take 75 mg by mouth 2 (two) times daily.      . rosuvastatin (CRESTOR) 20 MG tablet Take 20 mg by mouth daily.      . furosemide (LASIX) 40 MG tablet Take 1 tablet (40 mg total) by mouth 2 (two) times daily.  60 tablet  3  . losartan (COZAAR) 100 MG tablet 1/2 tablet (total 35m) daily      . potassium chloride SA (K-DUR,KLOR-CON) 20 MEQ tablet 2 tablets(total 40 mEq) in the AM and 1 tablet in the PM  90 tablet  6  . warfarin (COUMADIN) 5 MG tablet Take 5 mg by mouth daily. 5 mg Monday and Friday. 2.5 mg every Tuesday, Wednesday,  Thursday, Saturday, Sunday.       No current facility-administered medications for this visit.    BP 114/66  Pulse 80  Ht _0  (1.575 m)  Wt 171 lb (77.565 kg)  BMI 31.27 kg/m2  SpO2 86% General: NAD Neck: JVP 12 cm, no thyromegaly or thyroid nodule.  Lungs: Clear to auscultation bilaterally with normal respiratory effort. CV:  Nondisplaced PMI.  Heart regular S1/S2, suspect right-sided S3, 2/6 HSM LLSB.  1+ edema 1/2 up lower legs bilaterally.  No carotid bruit.  Normal pedal pulses.  Abdomen: Soft, nontender, no hepatosplenomegaly, no distention.  Skin: Intact without lesions or rashes.  Neurologic: Alert and oriented x 3.  Psych: Normal affect. Extremities: No clubbing or cyanosis.  HEENT: Normal.   Assessment/Plan: 1. Pulmonary HTN: Patient had significantly elevated PA pressure estimation by echo.  Question here is whether the pulmonary hypertension is primarily pulmonary venous HTN from LV pressure/volume overload (due to diastolic CHF) or whether there is a component of  pulmonary arterial hypertension that may be treatable by pulmonary vasodilators.  It is possible also that long-standing PCWP elevation from diastolic dysfunction could lead to pulmonary vascular changes with pulmonary arterial hypertension out of proportion to the PCWP elevation.  RHC will be needed to differentiate.   - Plan for [place] Friday.  She will stop her coumadin today.  - Begin immunological workup for pulmonary HTN with ANA, anti-SCL70. If there is significant PAH on RHC, will need to arrange sleep study and V/Q scan.  2. Chronic diastolic CHF: EF preserved on echo with severe LVH.  The LVH is relatively concentric.  It is possible that the LVH is due to years of HTN (has been hypertensive since her 31s.  However, I would also consider the possibility of cardiac amyloidosis given severe LV hypertrophy.  Mrs Vanderkolk is volume overloaded today with elevated neck veins.  I do not think that JVP elevation here is fully explainable by the moderate to severe TR alone.  - Serum/urine immunofixation for AL amyloidosis (though she could alternatively have transthyretin amyloid).   - Cardiac MRI to assess for myocardial infiltration.  - Increase Lasix to 40 mg bid, increase KCl to 40 qam, 20 qpm.  BMET/BNP in 1 week. 3. Lightheadedness: Decrease losartan to 50 mg daily.  4. Chronic atrial fibrillation: She is on metoprolol and coumadin.  Coumadin to be held pending RHC on Friday.    Followup in 2 wks.   [name] 10/26/2012

## 2012-10-31 ENCOUNTER — Ambulatory Visit (HOSPITAL_COMMUNITY)
Admission: RE | Admit: 2012-10-31 | Discharge: 2012-10-31 | Disposition: A | Discharge status: Home / Self Care | Payer: Medicare Other | Source: Ambulatory Visit | Attending: Cardiology | Admitting: Cardiology

## 2012-10-31 DIAGNOSIS — R0989 Other specified symptoms and signs involving the circulatory and respiratory systems: Secondary | ICD-10-CM | POA: Insufficient documentation

## 2012-10-31 DIAGNOSIS — R0609 Other forms of dyspnea: Secondary | ICD-10-CM | POA: Insufficient documentation

## 2012-10-31 DIAGNOSIS — I272 Pulmonary hypertension, unspecified: Secondary | ICD-10-CM

## 2012-10-31 LAB — POCT I-STAT 3, VENOUS BLOOD GAS (G3P V)
Acid-Base Excess: 2 mmol/L (ref 0.0–2.0)
Bicarbonate: 29 mEq/L — ABNORMAL HIGH (ref 20.0–24.0)
O2 Saturation: 57 %
pCO2, Ven: 51.7 mmHg — ABNORMAL HIGH (ref 45.0–50.0)

## 2012-10-31 MED ORDER — ALBUTEROL SULFATE (5 MG/ML) 0.5% IN NEBU
2.5000 mg | INHALATION_SOLUTION | Freq: Once | RESPIRATORY_TRACT | Status: AC
  Administered 2012-10-31: 2.5 mg via RESPIRATORY_TRACT

## 2012-11-01 ENCOUNTER — Ambulatory Visit (INDEPENDENT_AMBULATORY_CARE_PROVIDER_SITE_OTHER): Payer: Medicare Other | Admitting: Cardiology

## 2012-11-01 ENCOUNTER — Encounter: Payer: Self-pay | Admitting: Cardiology

## 2012-11-01 ENCOUNTER — Other Ambulatory Visit: Payer: Medicare Other

## 2012-11-01 ENCOUNTER — Telehealth: Payer: Self-pay | Admitting: Cardiology

## 2012-11-01 ENCOUNTER — Other Ambulatory Visit (INDEPENDENT_AMBULATORY_CARE_PROVIDER_SITE_OTHER): Payer: Medicare Other

## 2012-11-01 ENCOUNTER — Ambulatory Visit: Payer: Medicare Other | Admitting: Cardiology

## 2012-11-01 VITALS — BP 124/74 | HR 65 | Ht 62.0 in | Wt 174.0 lb

## 2012-11-01 DIAGNOSIS — I5032 Chronic diastolic (congestive) heart failure: Secondary | ICD-10-CM | POA: Diagnosis not present

## 2012-11-01 DIAGNOSIS — I509 Heart failure, unspecified: Secondary | ICD-10-CM

## 2012-11-01 DIAGNOSIS — R0989 Other specified symptoms and signs involving the circulatory and respiratory systems: Secondary | ICD-10-CM

## 2012-11-01 DIAGNOSIS — I272 Pulmonary hypertension, unspecified: Secondary | ICD-10-CM

## 2012-11-01 DIAGNOSIS — R0602 Shortness of breath: Secondary | ICD-10-CM | POA: Diagnosis not present

## 2012-11-01 DIAGNOSIS — I2789 Other specified pulmonary heart diseases: Secondary | ICD-10-CM | POA: Diagnosis not present

## 2012-11-01 DIAGNOSIS — I4891 Unspecified atrial fibrillation: Secondary | ICD-10-CM

## 2012-11-01 LAB — BASIC METABOLIC PANEL
CO2: 28 mEq/L (ref 19–32)
Calcium: 8.9 mg/dL (ref 8.4–10.5)
GFR: 60.94 mL/min (ref >60.00)
Sodium: 137 mEq/L (ref 135–145)

## 2012-11-01 LAB — HEPATIC FUNCTION PANEL
Bilirubin, Direct: 0.4 mg/dL — ABNORMAL HIGH (ref 0.0–0.3)
Total Bilirubin: 1.6 mg/dL — ABNORMAL HIGH (ref 0.3–1.2)

## 2012-11-01 NOTE — Progress Notes (Deleted)
Patient ID: Vicki [name], female   DOB: [birth year], 72 y.o.   MRN: [id number]

## 2012-11-01 NOTE — Patient Instructions (Addendum)
Your physician recommends that you have lab work today--BMET/BNP/Rheumatoid factor/Liver profile.  Your physician has recommended that you have a sleep study. This test records several body functions during sleep, including: brain activity, eye movement, oxygen and carbon dioxide blood levels, heart rate and rhythm, breathing rate and rhythm, the flow of air through your mouth and nose, snoring, body muscle movements, and chest and belly movement.  Your physician has requested that you have a cardiac MRI. Cardiac MRI uses a computer to create images of your heart as its beating, producing both still and moving pictures of your heart and major blood vessels. For further information please visit [email or web address] Please follow the instruction sheet given to you today for more information.   Your physician recommends that you schedule a follow-up appointment in: 3-4 weeks with Dr Aundra Dubin.   I am going to send off an application for Opsumit.  [place] about this.

## 2012-11-01 NOTE — Telephone Encounter (Signed)
Spoke to [place] and the order for O2 4/L min faxed.

## 2012-11-01 NOTE — Telephone Encounter (Signed)
New Problem:    Called in stating that Tommi Rumps called them to state that the patient was on 4 liters of O2 per minuet and would like to order or OV notes stating that so she could have her compressor switched fax- [phone].  Please call back if you have any questions.

## 2012-11-02 ENCOUNTER — Telehealth: Payer: Self-pay | Admitting: *Deleted

## 2012-11-02 NOTE — Progress Notes (Signed)
Patient ID: Vicki [name], female   DOB: [birth year], 72 y.o.   MRN: [id number] PCP: Dr. Wilson Singer  72 yo with history of chronic diastolic CHF and chronic atrial fibrillation presents for followup of pulmonary hypertension.  Patient was followed by Dr. Glade Lloyd in the past for chronic atrial fibrillation.  She has been on coumadin.  More recently, she was referred to Dr. Ellyn Hack.  She reports progressive exertional dyspnea since 2011.  This has gradually worsened and has become quite significant over the last few months.  She is now short of breath just walking around her house.  She has to walk very slowly.  She is very short of breath with steps and tries not to climb the stairs in her house.  No PND, occasional chest tightness that does not seem to be exertional.  She gets occasional lightheadedness with standing.  This has improved but not resolved since cutting back on amlodipine and losartan.  She used to have significant HTN, but more recently her BP has been on the lower side.  Echo was done in 2/14, showing severe concentric LVH with EF 55-60%, moderately dilated RV, moderate to severe TR, and PA systolic pressure 86 mmHg.  I did a right heart cath in 5/14.  This showed PA pressure 104/36 with PCWP 20, suggesting pulmonary arterial HTN well out of proportion to the mildly elevate wedge pressure.  She was already on amlodipine so I did not do vasodilator testing.  V/Q scan was done, showing no evidence for chronic PEs.    Her symptoms are stable today.  I have had her increase her oxygen to 4 L by nasal cannula due to oxygen saturation in the 80s at times on 2 L [place].  I increased her Lasix to 40 mg bid.    ECG: atrial fibrillation, LVH with repolarization abnormality  Labs (12/13): HCT 45.1, plts 153, K 3.5, creatinine 1.0 Labs (1/14): BNP 849 Labs (4/14): K 3.1, creatinine 1.0, ANA and anti-SCL-70 antibody negative.  Rheumatoid factor negative.  Serum immunofixation did not show monoclonal light chains.    PMH: 1. Chronic diastolic CHF: Echo (4/78) with EF 55-60%, severe LVH (no SAM, no asymmetric hypertrophy, no LVOT gradient), moderate-severe LAE, moderately dilated RV with mildly decreased systolic function, moderate to severe RAE, PA systolic pressure 86 mmHg, moderate-severe TR, moderate MR, trivial pericardial effusion.  2. Chronic atrial fibrillation since around 2004.  3. HTN: For decades.  4. LHC (2/08) with no significant disease.  5. Chronic thrombocytopenia 6. Pulmonary arterial HTN: RHC (5/14) with mean RA 13, PA 104/36 (mean 63), mean PCWP 20 on right and 23 on left, CI 2.3 (Fick) and 1.6 (thermo), PVR 10.4 WU (Fick) and 15 WU (thermo).  Vasodilator testing not done as patient was already on amlodipine.  V/Q scan (5/14) with no evidence for chronic PE.  ANA, RF, and anti-SCL70 antibody negative.   SH: Married, retired from a bank, 3 children, lives in [place].   FH: No heart disease, +HTN.  ROS: All systems reviewed and negative except as per HPI.   Current Outpatient Prescriptions  Medication Sig Dispense Refill  . amLODipine (NORVASC) 5 MG tablet Take 5 mg by mouth daily.      . furosemide (LASIX) 40 MG tablet Take 1 tablet (40 mg total) by mouth 2 (two) times daily.  60 tablet  3  . losartan (COZAAR) 100 MG tablet 1/2 tablet (total 76m) daily      . metoprolol (LOPRESSOR) 50 MG tablet Take  75 mg by mouth 2 (two) times daily.      . potassium chloride SA (K-DUR,KLOR-CON) 20 MEQ tablet 2 tablets(total 40 mEq) in the AM and 1 tablet in the PM  90 tablet  6  . rosuvastatin (CRESTOR) 20 MG tablet Take 20 mg by mouth daily.      Marland Kitchen warfarin (COUMADIN) 5 MG tablet Take 5 mg by mouth daily. 5 mg Monday and Friday. 2.5 mg every Tuesday, Wednesday,  Thursday, Saturday, Sunday.       No current facility-administered medications for this visit.    BP 124/74  Pulse 65  Ht _0  (1.575 m)  Wt 174 lb (78.926 kg)  BMI 31.82 kg/m2  SpO2 90% General: NAD Neck: JVP 12 cm, no  thyromegaly or thyroid nodule.  Lungs: Clear to auscultation bilaterally with normal respiratory effort. CV: Nondisplaced PMI.  Heart regular S1/S2, suspect right-sided S3, 2/6 HSM LLSB.  1+ edema 1/3 up lower legs bilaterally.  No carotid bruit.  Normal pedal pulses.  Abdomen: Soft, nontender, no hepatosplenomegaly, no distention.  Neurologic: Alert and oriented x 3.  Psych: Normal affect. Extremities: No clubbing or cyanosis.   Assessment/Plan: 1. Pulmonary HTN: Patient has severe pulmonary arterial HTN out of proportion to mildly elevated PWCP on right heart cath.  PVR is 10.4 WU and CI is 2.3 by Fick (1.6 by thermodilution).  It is possible that long-standing PCWP elevation from diastolic dysfunction in setting of hypertensive heart disease, HCM, or cardiac amyloidosis could lead to pulmonary vascular changes with pulmonary arterial hypertension out of proportion to the PCWP elevation.  However, I cannot rule out possible idiopathic primary pulmonary HTN.  Collagen vascular disease workup is negative (negative RF, ANA and negative anti-SCL-70).  V/Q scan is not suggestive of chronic PEs.     - I will get PFTs and arrange for a sleep study. - Continue oxygen for hypoxia.   - I debated starting her initially on IV therapy, however CI was 2.3 by Fick so I decided to start with oral meds.  I am going to begin macitentan 10 mg daily (will get LFTs today).  I will see her back closely in followup and tentatively plan to add on tadalafil.  After meds are optimized will repeat RHC.  She may eventually need IV Flolan.  I will check baseline LFTs.  2. Chronic diastolic CHF: EF preserved on echo with severe LVH.  The LVH is relatively concentric.  It is possible that the LVH is due to years of HTN (has been hypertensive since her 32s).  However, I would also consider the possibility of cardiac amyloidosis given severe LV hypertrophy.  I did a serum immunofixation that showed no monoclonal light chains, but  cannot rule out transthyretin amyloid.  Though she has primarily pulmonary arterial HTN by RHC, she did have mildly elevated PWCP.   - Cardiac MRI to assess for myocardial infiltration.  - Continue Lasix at 40 mg po bid.   3. Lightheadedness: Better with decrease in amlodipine and losartan, will likely need to decrease further.  I suspect her falling BP is related to worsening pulmonary arterial HTN and lowering in cardiac output.   4. Chronic atrial fibrillation: She is on metoprolol and coumadin.    Followup in 3-4 wks.   [name] 11/02/2012

## 2012-11-02 NOTE — Telephone Encounter (Signed)
Completed Opsumit Patient Enrollment and Consent Form faxed to [phone]. Copy of application on Triage Cart

## 2012-11-03 ENCOUNTER — Institutional Professional Consult (permissible substitution): Payer: Medicare Other | Admitting: Emergency Medicine

## 2012-11-03 ENCOUNTER — Encounter: Payer: Self-pay | Admitting: Cardiology

## 2012-11-04 ENCOUNTER — Telehealth: Payer: Self-pay | Admitting: Cardiology

## 2012-11-04 NOTE — Telephone Encounter (Signed)
Spoke with [name] at [employer]. They have received enrollment form but pt's insurance is requiring a prior authorization. This paperwork was sent to our office. The number to contact for prior auth is [phone].  I will try and obtain prior authorization by phone. I spoke with rep in prior auth department at [facility] and gave information requested. Reference number is [id number]. Results of prior auth request will be faxed to our office within the next 24-72 hours.  Results can also be requested by phone in 24-72 hours by giving reference number.

## 2012-11-04 NOTE — Telephone Encounter (Signed)
New problem   Marilyn/[employer] wanting to let you know she faxed over a prescription auth for opsumit.

## 2012-11-08 ENCOUNTER — Other Ambulatory Visit: Payer: Medicare Other

## 2012-11-08 ENCOUNTER — Ambulatory Visit: Payer: Medicare Other | Admitting: Cardiology

## 2012-11-08 DIAGNOSIS — I4891 Unspecified atrial fibrillation: Secondary | ICD-10-CM | POA: Diagnosis not present

## 2012-11-08 DIAGNOSIS — Z7901 Long term (current) use of anticoagulants: Secondary | ICD-10-CM | POA: Diagnosis not present

## 2012-11-08 DIAGNOSIS — I10 Essential (primary) hypertension: Secondary | ICD-10-CM | POA: Diagnosis not present

## 2012-11-08 DIAGNOSIS — I509 Heart failure, unspecified: Secondary | ICD-10-CM | POA: Diagnosis not present

## 2012-11-16 ENCOUNTER — Ambulatory Visit (HOSPITAL_COMMUNITY)
Admission: RE | Admit: 2012-11-16 | Discharge: 2012-11-16 | Disposition: A | Discharge status: Home / Self Care | Payer: Medicare Other | Source: Ambulatory Visit | Attending: Cardiology | Admitting: Cardiology

## 2012-11-16 DIAGNOSIS — I4949 Other premature depolarization: Secondary | ICD-10-CM | POA: Diagnosis present

## 2012-11-16 DIAGNOSIS — I2789 Other specified pulmonary heart diseases: Secondary | ICD-10-CM | POA: Diagnosis not present

## 2012-11-16 DIAGNOSIS — I2609 Other pulmonary embolism with acute cor pulmonale: Secondary | ICD-10-CM | POA: Diagnosis not present

## 2012-11-16 DIAGNOSIS — I5031 Acute diastolic (congestive) heart failure: Secondary | ICD-10-CM | POA: Diagnosis not present

## 2012-11-16 DIAGNOSIS — I279 Pulmonary heart disease, unspecified: Secondary | ICD-10-CM | POA: Diagnosis not present

## 2012-11-16 DIAGNOSIS — G4730 Sleep apnea, unspecified: Secondary | ICD-10-CM | POA: Diagnosis present

## 2012-11-16 DIAGNOSIS — D696 Thrombocytopenia, unspecified: Secondary | ICD-10-CM | POA: Diagnosis not present

## 2012-11-16 DIAGNOSIS — I959 Hypotension, unspecified: Secondary | ICD-10-CM | POA: Diagnosis present

## 2012-11-16 DIAGNOSIS — I10 Essential (primary) hypertension: Secondary | ICD-10-CM | POA: Diagnosis present

## 2012-11-16 DIAGNOSIS — I4891 Unspecified atrial fibrillation: Secondary | ICD-10-CM

## 2012-11-16 DIAGNOSIS — I5033 Acute on chronic diastolic (congestive) heart failure: Secondary | ICD-10-CM | POA: Diagnosis not present

## 2012-11-16 DIAGNOSIS — J9600 Acute respiratory failure, unspecified whether with hypoxia or hypercapnia: Secondary | ICD-10-CM | POA: Diagnosis present

## 2012-11-16 DIAGNOSIS — I5032 Chronic diastolic (congestive) heart failure: Secondary | ICD-10-CM

## 2012-11-16 DIAGNOSIS — R0602 Shortness of breath: Secondary | ICD-10-CM

## 2012-11-16 DIAGNOSIS — I509 Heart failure, unspecified: Secondary | ICD-10-CM | POA: Diagnosis not present

## 2012-11-16 DIAGNOSIS — R0902 Hypoxemia: Secondary | ICD-10-CM | POA: Diagnosis present

## 2012-11-16 DIAGNOSIS — Z79899 Other long term (current) drug therapy: Secondary | ICD-10-CM | POA: Diagnosis not present

## 2012-11-16 DIAGNOSIS — E876 Hypokalemia: Secondary | ICD-10-CM | POA: Diagnosis present

## 2012-11-16 DIAGNOSIS — Z7901 Long term (current) use of anticoagulants: Secondary | ICD-10-CM | POA: Diagnosis not present

## 2012-11-16 MED ORDER — GADOBENATE DIMEGLUMINE 529 MG/ML IV SOLN
23.0000 mL | Freq: Once | INTRAVENOUS | Status: AC
  Administered 2012-11-16: 23 mL via INTRAVENOUS

## 2012-11-16 NOTE — Telephone Encounter (Signed)
Received denial letter for coverage for Opsumit  from [facility]. I spoke with [place] from [place]. A 30 day supply of Opsumit was shipped to pt 11/04/12. They can bridge patient for 90 days.   I spoke with patient. Pt states she has received and has been taking Opsumit for about 1 week. Pt states she feels she has had an improvement in her energy level since she started taking it. I will review with Dr Aundra Dubin to see if he wants to appeal decision by [employer] to deny coverage for Opsumit.

## 2012-11-16 NOTE — Telephone Encounter (Signed)
I spoke with Sheela Stack today [phone]. I had been told by previous reps that approval for Opsumit was denied. I had spoken with representatives several times trying to get a copy of denial letter faxed to [phone]. I have not received this notification. Myrna Blazer was going to fax notification of denial to [phone], which she said I should get within 3-5 minutes.

## 2012-11-16 NOTE — Telephone Encounter (Signed)
Follow up     Status of prior authorization. - opsumit

## 2012-11-16 NOTE — Telephone Encounter (Signed)
Reviewed with Dr Aundra Dubin. He will write a letter to [facility] to appeal denial of coverage for Opsumit.

## 2012-11-17 ENCOUNTER — Telehealth: Payer: Self-pay | Admitting: Cardiology

## 2012-11-17 ENCOUNTER — Ambulatory Visit (HOSPITAL_BASED_OUTPATIENT_CLINIC_OR_DEPARTMENT_OTHER): Payer: Medicare Other | Admitting: Radiology

## 2012-11-17 VITALS — Ht 62.0 in | Wt 171.0 lb

## 2012-11-17 DIAGNOSIS — I4891 Unspecified atrial fibrillation: Secondary | ICD-10-CM

## 2012-11-17 DIAGNOSIS — R0602 Shortness of breath: Secondary | ICD-10-CM

## 2012-11-17 DIAGNOSIS — I5032 Chronic diastolic (congestive) heart failure: Secondary | ICD-10-CM

## 2012-11-17 DIAGNOSIS — I272 Pulmonary hypertension, unspecified: Secondary | ICD-10-CM

## 2012-11-17 DIAGNOSIS — G4733 Obstructive sleep apnea (adult) (pediatric): Secondary | ICD-10-CM

## 2012-11-17 DIAGNOSIS — I279 Pulmonary heart disease, unspecified: Secondary | ICD-10-CM

## 2012-11-17 NOTE — Telephone Encounter (Signed)
Reviewed results of Cardiac MRI with patient who verbalized understanding

## 2012-11-17 NOTE — Telephone Encounter (Signed)
New Prob    Pt calling in returning call from earlier. Please call.

## 2012-11-18 ENCOUNTER — Encounter (HOSPITAL_COMMUNITY): Payer: Self-pay | Admitting: Emergency Medicine

## 2012-11-18 ENCOUNTER — Inpatient Hospital Stay (HOSPITAL_COMMUNITY)
Admission: EM | Admit: 2012-11-18 | Discharge: 2012-11-22 | DRG: 291 | Disposition: A | Discharge status: Home / Self Care | Payer: Medicare Other | Attending: Family Medicine | Admitting: Family Medicine

## 2012-11-18 ENCOUNTER — Emergency Department (HOSPITAL_COMMUNITY): Payer: Medicare Other

## 2012-11-18 ENCOUNTER — Other Ambulatory Visit: Payer: Self-pay | Admitting: *Deleted

## 2012-11-18 DIAGNOSIS — I959 Hypotension, unspecified: Secondary | ICD-10-CM | POA: Diagnosis present

## 2012-11-18 DIAGNOSIS — E876 Hypokalemia: Secondary | ICD-10-CM | POA: Diagnosis present

## 2012-11-18 DIAGNOSIS — I509 Heart failure, unspecified: Secondary | ICD-10-CM | POA: Diagnosis not present

## 2012-11-18 DIAGNOSIS — I4891 Unspecified atrial fibrillation: Secondary | ICD-10-CM | POA: Diagnosis present

## 2012-11-18 DIAGNOSIS — Z79899 Other long term (current) drug therapy: Secondary | ICD-10-CM

## 2012-11-18 DIAGNOSIS — I2609 Other pulmonary embolism with acute cor pulmonale: Secondary | ICD-10-CM | POA: Diagnosis not present

## 2012-11-18 DIAGNOSIS — I5031 Acute diastolic (congestive) heart failure: Secondary | ICD-10-CM | POA: Diagnosis present

## 2012-11-18 DIAGNOSIS — I5032 Chronic diastolic (congestive) heart failure: Secondary | ICD-10-CM | POA: Diagnosis present

## 2012-11-18 DIAGNOSIS — D696 Thrombocytopenia, unspecified: Secondary | ICD-10-CM | POA: Diagnosis present

## 2012-11-18 DIAGNOSIS — J9600 Acute respiratory failure, unspecified whether with hypoxia or hypercapnia: Secondary | ICD-10-CM | POA: Diagnosis present

## 2012-11-18 DIAGNOSIS — I5033 Acute on chronic diastolic (congestive) heart failure: Secondary | ICD-10-CM | POA: Diagnosis not present

## 2012-11-18 DIAGNOSIS — I10 Essential (primary) hypertension: Secondary | ICD-10-CM | POA: Diagnosis present

## 2012-11-18 DIAGNOSIS — I4949 Other premature depolarization: Secondary | ICD-10-CM | POA: Diagnosis present

## 2012-11-18 DIAGNOSIS — Z7901 Long term (current) use of anticoagulants: Secondary | ICD-10-CM

## 2012-11-18 DIAGNOSIS — G4730 Sleep apnea, unspecified: Secondary | ICD-10-CM | POA: Diagnosis present

## 2012-11-18 DIAGNOSIS — I272 Pulmonary hypertension, unspecified: Secondary | ICD-10-CM | POA: Diagnosis present

## 2012-11-18 DIAGNOSIS — I2789 Other specified pulmonary heart diseases: Secondary | ICD-10-CM | POA: Diagnosis not present

## 2012-11-18 DIAGNOSIS — R0602 Shortness of breath: Secondary | ICD-10-CM | POA: Diagnosis not present

## 2012-11-18 DIAGNOSIS — R0902 Hypoxemia: Secondary | ICD-10-CM | POA: Diagnosis present

## 2012-11-18 HISTORY — DX: Unspecified atrial fibrillation: I48.91

## 2012-11-18 HISTORY — DX: Heart failure, unspecified: I50.9

## 2012-11-18 LAB — BASIC METABOLIC PANEL
BUN: 16 mg/dL (ref 6–23)
Calcium: 9.2 mg/dL (ref 8.4–10.5)
GFR calc Af Amer: 76 mL/min — ABNORMAL LOW (ref >90)
GFR calc non Af Amer: 65 mL/min — ABNORMAL LOW (ref >90)
Potassium: 4 mEq/L (ref 3.5–5.1)
Sodium: 138 mEq/L (ref 135–145)

## 2012-11-18 LAB — CBC WITH DIFFERENTIAL/PLATELET
Basophils Absolute: 0 10*3/uL (ref 0.0–0.1)
Eosinophils Absolute: 0 10*3/uL (ref 0.0–0.7)
Lymphocytes Relative: 10 % — ABNORMAL LOW (ref 12–46)
MCH: 30.5 pg (ref 26.0–34.0)
MCHC: 31.8 g/dL (ref 30.0–36.0)
Monocytes Absolute: 0.4 10*3/uL (ref 0.1–1.0)
Neutro Abs: 7.5 10*3/uL (ref 1.7–7.7)
Neutrophils Relative %: 86 % — ABNORMAL HIGH (ref 43–77)
RDW: 15.4 % (ref 11.5–15.5)

## 2012-11-18 LAB — PROTIME-INR: INR: 2.05 — ABNORMAL HIGH (ref 0.00–1.49)

## 2012-11-18 LAB — TROPONIN I: Troponin I: <0.3 ng/mL (ref <0.30)

## 2012-11-18 MED ORDER — WARFARIN SODIUM 5 MG PO TABS: 5.0000 mg | ORAL_TABLET | ORAL | Status: DC

## 2012-11-18 MED ORDER — FUROSEMIDE 10 MG/ML IJ SOLN
60.0000 mg | Freq: Once | INTRAMUSCULAR | Status: AC
  Administered 2012-11-18: 60 mg via INTRAVENOUS
  Filled 2012-11-18: qty 8

## 2012-11-18 MED ORDER — ONDANSETRON HCL 4 MG/2ML IJ SOLN: 4.0000 mg | Freq: Four times a day (QID) | INTRAMUSCULAR | Status: DC | PRN

## 2012-11-18 MED ORDER — POTASSIUM CHLORIDE CRYS ER 20 MEQ PO TBCR
20.0000 meq | EXTENDED_RELEASE_TABLET | Freq: Every day | ORAL | Status: DC
  Administered 2012-11-18 – 2012-11-21 (×4): 20 meq via ORAL
  Filled 2012-11-18 (×4): qty 1

## 2012-11-18 MED ORDER — MACITENTAN 10 MG PO TABS
1.0000 | ORAL_TABLET | Freq: Every day | ORAL | Status: DC
  Filled 2012-11-18 (×2): qty 1

## 2012-11-18 MED ORDER — LOSARTAN POTASSIUM 50 MG PO TABS
50.0000 mg | ORAL_TABLET | Freq: Every day | ORAL | Status: DC
  Administered 2012-11-18 – 2012-11-19 (×2): 50 mg via ORAL
  Filled 2012-11-18 (×3): qty 1

## 2012-11-18 MED ORDER — WARFARIN SODIUM 2.5 MG PO TABS
2.5000 mg | ORAL_TABLET | ORAL | Status: DC
  Filled 2012-11-18: qty 1

## 2012-11-18 MED ORDER — MACITENTAN 10 MG PO TABS
1.0000 | ORAL_TABLET | Freq: Every day | ORAL | Status: DC
  Administered 2012-11-18 – 2012-11-21 (×4): 1 via ORAL
  Administered 2012-11-22: 08:00:00 via ORAL
  Filled 2012-11-18: qty 1

## 2012-11-18 MED ORDER — ATORVASTATIN CALCIUM 40 MG PO TABS
40.0000 mg | ORAL_TABLET | Freq: Every day | ORAL | Status: DC
  Administered 2012-11-18 – 2012-11-21 (×4): 40 mg via ORAL
  Filled 2012-11-18 (×5): qty 1

## 2012-11-18 MED ORDER — METOPROLOL TARTRATE 50 MG PO TABS
75.0000 mg | ORAL_TABLET | Freq: Two times a day (BID) | ORAL | Status: DC
  Administered 2012-11-18 – 2012-11-22 (×9): 75 mg via ORAL
  Filled 2012-11-18 (×11): qty 1

## 2012-11-18 MED ORDER — AMLODIPINE BESYLATE 5 MG PO TABS
5.0000 mg | ORAL_TABLET | Freq: Every day | ORAL | Status: DC
  Administered 2012-11-18 – 2012-11-21 (×4): 5 mg via ORAL
  Filled 2012-11-18 (×5): qty 1

## 2012-11-18 MED ORDER — WARFARIN SODIUM 2.5 MG PO TABS
2.5000 mg | ORAL_TABLET | Freq: Once | ORAL | Status: AC
  Administered 2012-11-18: 2.5 mg via ORAL
  Filled 2012-11-18: qty 1

## 2012-11-18 MED ORDER — SODIUM CHLORIDE 0.9 % IJ SOLN
3.0000 mL | Freq: Two times a day (BID) | INTRAMUSCULAR | Status: DC
  Administered 2012-11-18 – 2012-11-22 (×9): 3 mL via INTRAVENOUS

## 2012-11-18 MED ORDER — WARFARIN - PHYSICIAN DOSING INPATIENT: Freq: Every day | Status: DC

## 2012-11-18 MED ORDER — ACETAMINOPHEN 325 MG PO TABS
650.0000 mg | ORAL_TABLET | ORAL | Status: DC | PRN
  Administered 2012-11-18: 650 mg via ORAL
  Filled 2012-11-18: qty 2

## 2012-11-18 MED ORDER — FUROSEMIDE 10 MG/ML IJ SOLN
40.0000 mg | Freq: Two times a day (BID) | INTRAMUSCULAR | Status: DC
  Administered 2012-11-18 – 2012-11-19 (×3): 40 mg via INTRAVENOUS
  Filled 2012-11-18 (×4): qty 4

## 2012-11-18 MED ORDER — SODIUM CHLORIDE 0.9 % IJ SOLN: 3.0000 mL | INTRAMUSCULAR | Status: DC | PRN

## 2012-11-18 MED ORDER — SODIUM CHLORIDE 0.9 % IV SOLN: 250.0000 mL | INTRAVENOUS | Status: DC | PRN

## 2012-11-18 NOTE — ED Provider Notes (Addendum)
Medical screening examination/treatment/procedure(s) were conducted as a shared visit with non-physician practitioner(s) and myself.  I personally evaluated the patient during the encounter  5:40 AM Patient resting comfortably on nonrebreather. She denies any respiratory distress at this time. She denies chest pain at this time. Rales are heard in the bases bilaterally.  EKG Interpretation:  Date & Time: 11/18/2012 1:44 AM  Rate: 77  Rhythm: atrial fibrillation and premature ventricular contractions (PVC)  QRS Axis: normal  Intervals: normal  ST/T Wave abnormalities: nonspecific ST/T changes  Conduction Disutrbances:none  Narrative Interpretation: LVH  Old EKG Reviewed: PVCs not seen previously      Wynetta Fines, MD 11/18/12 0540  Wynetta Fines, MD 11/18/12 [phone]

## 2012-11-18 NOTE — ED Notes (Signed)
Patient transported to X-ray 

## 2012-11-18 NOTE — H&P (Addendum)
[facility] History and Physical  JOLETTE LANA VHQ:[id number] DOB: 08-09-1940 DOA: 11/18/2012  Referring physician: ER physician PCP: Dwan Bolt, MD   Chief Complaint: shortness of breath  HPI:  72 year old female with past medical history of chronic diastolic CHF (2 D ECHO in 07/2012 showed severe concentric LVH with ED 81 - 60%, moderate to severe TR and PA systolic pressure of 85 mmHg), atrial fibrillation on coumadin, recent right heart cath (10/28/2012) for evaluation of exertional dyspnea and pulmonary hypertension which subsequently revealed severe pulmonary arterial hypertension who presented to [facility] ED with progressively worsening shortness of breath over past 1-2 days. Patient does report chronic shortness of breath but this is worse. Patient was transferred for sleep study center(as part of pulmonary hypertension evaluation) to ED for evaluation of worsening shortness of breath. No reports of chest pain, no fever or chills. No  Abdominal pain, no nausea or vomiting. No lightheadedness or loss of consciousness. In ED, vital signs are stable with BP 122/63 and HR 79. Patient did desaturate to 88% without nasal canula but this has improved with 4 L nasal canula oxygen support. Her BNP was 5147 (last value 5/6 1189). CBC revealed stable chronic low platelet count of 94.  Assessment and Plan:  Principal Problem:   Acute hypoxic respiratory failure in the setting of acute diastolic CHF (congestive heart failure) - appreciate cardiology consult. ED physician has consulted cardiology to follow up in am - BNP on this admission is 5147 which is above recent value BNP 11/01/2012 1198. - started lasix 40 mg IV Q 12 hours, strict intake/output/ daily weight and correct electrolytes as needed. - continue metoprolol, norvasc and losartan - continue crestor  Active Problems:   Thrombocytopenia - chronic, in 09/2012 platelet count 73 and on this admission 94 - no acute bleed - continue  to monitor platelet count - of note, in 2013 patient did follow up with Dr. 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 Recommendation was for observation and transfusion in platelet count is less than 50K   Pulmonary arterial hypertension - appreciate cardiology following - as mentioned above had recent [place] for evaluation of pulmonary hypertension   Atrial fibrillation - on coumadin - continue metoprolol, HR reasonably controlled  Code Status: Full Family Communication: Pt at bedside Disposition Plan: Admit for further evaluation  Leisa Lenz, MD  [facility] Pager [phone]  If 7PM-7AM, please contact night-coverage [email or web address] Password [facility] 11/18/2012, 5:27 AM   Review of Systems:  Constitutional: Negative for fever, chills and malaise/fatigue. Negative for diaphoresis.  HENT: Negative for hearing loss, ear pain, nosebleeds, congestion, sore throat, neck pain, tinnitus and ear discharge.  Eyes: Negative for blurred vision, double vision, photophobia, pain, discharge and redness.  Respiratory: Negative for cough, hemoptysis, sputum production, positive for shortness of breath, wheezing and stridor.   Cardiovascular: Negative for chest pain, palpitations, orthopnea, claudication.  Gastrointestinal: Negative for nausea, vomiting and abdominal pain. Negative for heartburn, constipation, blood in stool and melena.  Genitourinary: Negative for dysuria, urgency, frequency, hematuria and flank pain.  Musculoskeletal: Negative for myalgias, back pain, joint pain and falls.  Skin: Negative for itching and rash.  Neurological: Negative for dizziness and weakness. Negative for tingling, tremors, sensory change, speech change, focal weakness, loss of consciousness and headaches.  Endo/Heme/Allergies: Negative for environmental allergies and polydipsia. Does not bruise/bleed easily.  Psychiatric/Behavioral: Negative for suicidal  ideas. The patient is not nervous/anxious.  Past Medical History  Diagnosis Date  . Atrial fibrillation   . CHF (congestive heart failure)   . Sleep apnea   . Hypertension     pulmonary   History reviewed. No pertinent past surgical history. Social History:  reports that she has never smoked. She does not have any smokeless tobacco history on file. Her alcohol and drug histories are not on file.  No Known Allergies  Family History: htn, no heart disease  Prior to Admission medications   Medication Sig Start Date End Date Taking? Authorizing Provider  amLODipine (NORVASC) 5 MG tablet Take 5 mg by mouth daily.   Yes Historical Provider, MD  furosemide (LASIX) 40 MG tablet Take 1 tablet (40 mg total) by mouth 2 (two) times daily. 10/25/12  Yes Larey Dresser, MD  losartan (COZAAR) 100 MG tablet Take 50 mg by mouth daily.  10/25/12  Yes Larey Dresser, MD  Macitentan (OPSUMIT) 10 MG TABS Take 1 tablet by mouth daily with breakfast.   Yes Historical Provider, MD  metoprolol (LOPRESSOR) 50 MG tablet Take 75 mg by mouth 2 (two) times daily.   Yes Historical Provider, MD  potassium chloride SA (K-DUR,KLOR-CON) 20 MEQ tablet 2 tablets(total 40 mEq) in the AM and 1 tablet in the PM 10/25/12  Yes Larey Dresser, MD  rosuvastatin (CRESTOR) 20 MG tablet Take 20 mg by mouth daily.   Yes Historical Provider, MD  warfarin (COUMADIN) 5 MG tablet Take 5 mg by mouth daily. 5 mg on Monday . 2.5 mg every Tuesday, Wednesday,  Thursday,Friday, Saturday, & Sunday.   Yes Historical Provider, MD   Physical Exam: Filed Vitals:   11/18/12 0144  BP: 122/63  Pulse: 79  Temp: 98.1 F (36.7 C)  TempSrc: Oral  Resp: 22  SpO2: 99%    Physical Exam  Constitutional: Appears well-developed and well-nourished. No distress.  HENT: Normocephalic. External right and left ear normal. Oropharynx is clear and moist.  Eyes: Conjunctivae and EOM are normal. PERRLA, no scleral icterus.  Neck: Normal ROM. Neck  supple. No JVD. No tracheal deviation. No thyromegaly.  CVS: RRR, S1/S2  Appreciated, S3  Pulmonary: Effort and breath sounds normal, no stridor, rhonchi, wheezes, rales.  Abdominal: Soft. BS +,  no distension, tenderness, rebound or guarding.  Musculoskeletal: Normal range of motion. (+1) LE edema, no tenderness.  Lymphadenopathy: No lymphadenopathy noted, cervical, inguinal. Neuro: Alert. Normal reflexes, muscle tone coordination. No cranial nerve deficit. Skin: Skin is warm and dry. No rash noted. Not diaphoretic. No erythema. No pallor.  Psychiatric: Normal mood and affect. Behavior, judgment, thought content normal.   Labs on Admission:  Basic Metabolic Panel:  Recent Labs Lab 11/18/12 0345  NA 138  K 4.0  CL 101  CO2 31  GLUCOSE 98  BUN 16  CREATININE 0.87  CALCIUM 9.2   Liver Function Tests: No results found for this basename: AST, ALT, ALKPHOS, BILITOT, PROT, ALBUMIN,  in the last 168 hours No results found for this basename: LIPASE, AMYLASE,  in the last 168 hours No results found for this basename: AMMONIA,  in the last 168 hours CBC:  Recent Labs Lab 11/18/12 0345  WBC 8.8  NEUTROABS 7.5  HGB 13.5  HCT 42.4  MCV 95.9  PLT 94*   Cardiac Enzymes: No results found for this basename: CKTOTAL, CKMB, CKMBINDEX, TROPONINI,  in the last 168 hours BNP: No components found with this basename: POCBNP,  CBG: No results found for this basename: GLUCAP,  in  the last 168 hours  Radiological Exams on Admission: Dg Chest 2 View  11/18/2012   *RADIOLOGY REPORT*  Clinical Data: Shortness of breath.  CHEST - 2 VIEW  Comparison: 10/28/2012.  Findings: The heart is enlarged.  The mediastinal and hilar contours are prominent but unchanged.  There is central vascular congestion, perihilar predominant interstitial edema and small bilateral pleural effusions.  IMPRESSION:  CHF   Original Report Authenticated By: Marijo Sanes, M.D.   Mr Card Morphology Wo/w Cm  11/17/2012    Cardiac MRI:  Indication: R/O infiltrative disease  Protocol:  The patient was scanned on a 1.5 Tesla GE magnet.  A dedicated cardiac coil was used.  Functional imaging was done using Fiesta sequences.  2,3 and 4 chamber views were done to assess RWMA;s.  Quantitative  EF was calculated using Circle software on a dedicated work station.  The patient received 23 cc of Multihance. After 10 minutes inversion recovery sequences were done to assess for infarct or scar  Findings:  The was severe LVH.  Septal thickness was 17 mm with small LV cavity size.  There was moderate LAE and severe RAE.  The RV was moderately dilated but still contractile.  There was no ASD or VSD.  There was significant AV valve regurgitation of both mitral and tricuspid valves.  The quantitative EF was 62% ( ESV 43 cc EDV 114 cc SV 71 cc)  There were no RWMA;s.  The myocardium was easy to null.  There was no scar or infiltration seen  Impression:     1)    No LV myocardial scar or infiltration        2)    EF 62% no RWMA;s 3)    Moderate RV dilatation 4)    Biatrial enlargement 5)    AV valve regurgitation  Jenkins Rouge MD [facility]   Original Report Authenticated By: Jenkins Rouge, M.D.

## 2012-11-18 NOTE — ED Notes (Addendum)
Per EMS, pt ShOB on 5/22 morning, pt went to sleep study tonight, pts sats dropped to 81%.  Pt currently 95% on non-rebreather, lung sounds clear throughout.  Pt hx CHF, Lasix dose has been increased, edema noted to bilateral feet.  Denies pain.

## 2012-11-18 NOTE — Care Management Note (Addendum)
    Page 1 of 2   11/22/2012     1:30:04 PM   CARE MANAGEMENT NOTE 11/22/2012  Patient:  Vicki [name], Vicki [name]   Account Number:  [id number]  Date Initiated:  11/18/2012  Documentation initiated by:  Dessa Phi  Subjective/Objective Assessment:   ADMITTED W/CHF.HX:CHF.     Action/Plan:   FROM HOME W/SPOUSE.HAS HOME 02-ADULT PEDIATRIC CLINIC,HAS TRAVEL TANK.HAS PCP,PHARMACY.   Anticipated DC Date:  11/22/2012   Anticipated DC Plan:  [place]  CM consult      Choice offered to / List presented to:  C-1 Patient        [place] arranged  HH-1 RN  [facility]   Status of service:  Completed, signed off Medicare Important Message given?   (If response is "NO", the following Medicare IM given date fields will be blank) Date Medicare IM given:   Date Additional Medicare IM given:    Discharge Disposition:  [place]  Per UR Regulation:  Reviewed for med. necessity/level of care/duration of stay  If discussed at Long Length of Stay Meetings, dates discussed:    Comments:  11/22/12 Lilymae Swiech RN,BSN NCM [phone] D/C HOME W/HHRN-FAXED W/CONFIRMATION TO [facility] D/C SUMMARY,HHRN ORDER,F2F.  11/18/12 Chenille Toor RN,BSN NCM [place] IF HH NEEDED,FAXED H&P,FACE SHEET W/CONFIRMATION TO FAX#[phone]. WOULD RECOMMEND PT/OT CONS.PROVIDED [facility] AGENCY LIST.RECOMMEND HHRN.

## 2012-11-18 NOTE — ED Provider Notes (Signed)
History     CSN: [id number]  Arrival date & time 11/18/12  0134   First MD Initiated Contact with Patient 11/18/12 0230      Chief Complaint  Patient presents with  . Shortness of Breath    (Consider location/radiation/quality/duration/timing/severity/associated sxs/prior treatment) HPI History provided by pt.   Pt has exertional SOB on a daily basis.  Symptoms have been worse than normal since yesterday morning and she becomes dyspneic w/ minimal activity.  Was at a sleep study center tonight, became dyspneic, and was transferred to ED for further evaluation.  Denies recent fever, worse than baseline cough, chest pain, abdominal pain, N/V, worse than baseline peripheral edema.  Compliant w/ all medications.  Per prior chart, pt has diastolic CHF and atrial fibrillation.  Had a right heart cath on 5/14 that showed severe pulmonary HTN, as well as V/Q scan that was negative for PE.  She is 4L [place] at all times.   Past Medical History  Diagnosis Date  . Atrial fibrillation   . CHF (congestive heart failure)   . Sleep apnea   . Hypertension     pulmonary    History reviewed. No pertinent past surgical history.  History reviewed. No pertinent family history.  History  Substance Use Topics  . Smoking status: Never Smoker   . Smokeless tobacco: Not on file  . Alcohol Use: Not on file    OB History   Grav Para Term Preterm Abortions TAB SAB Ect Mult Living                  Review of Systems  All other systems reviewed and are negative.    Allergies  Review of patient's allergies indicates no known allergies.  Home Medications   Current Outpatient Rx  Name  Route  Sig  Dispense  Refill  . amLODipine (NORVASC) 5 MG tablet   Oral   Take 5 mg by mouth daily.         . furosemide (LASIX) 40 MG tablet   Oral   Take 1 tablet (40 mg total) by mouth 2 (two) times daily.   60 tablet   3   . losartan (COZAAR) 100 MG tablet   Oral   Take 50 mg by mouth daily.           . Macitentan (OPSUMIT) 10 MG TABS   Oral   Take 1 tablet by mouth daily with breakfast.         . metoprolol (LOPRESSOR) 50 MG tablet   Oral   Take 75 mg by mouth 2 (two) times daily.         . potassium chloride SA (K-DUR,KLOR-CON) 20 MEQ tablet      2 tablets(total 40 mEq) in the AM and 1 tablet in the PM   90 tablet   6   . rosuvastatin (CRESTOR) 20 MG tablet   Oral   Take 20 mg by mouth daily.         Marland Kitchen warfarin (COUMADIN) 5 MG tablet   Oral   Take 5 mg by mouth daily. 5 mg on Monday . 2.5 mg every Tuesday, Wednesday,  Thursday,Friday, Saturday, & Sunday.           BP 122/63  Pulse 79  Temp(Src) 98.1 F (36.7 C) (Oral)  Resp 22  SpO2 99%  Physical Exam  Nursing note and vitals reviewed. Constitutional: She is oriented to person, place, and time. She appears well-developed and well-nourished. No  distress.  HENT:  Head: Normocephalic and atraumatic.  Eyes:  Normal appearance  Neck: Normal range of motion.  Cardiovascular: Normal rate and regular rhythm.   Pulmonary/Chest: Effort normal. No respiratory distress. She has rales.  Respirations mildly labored, particularly when shes talking  Abdominal: Soft. Bowel sounds are normal. She exhibits no distension. There is no tenderness.  Musculoskeletal: Normal range of motion.  Trace, symmetric peripheral edema.  No tenderness.   Neurological: She is alert and oriented to person, place, and time.  Skin: Skin is warm and dry. No rash noted.  Psychiatric: She has a normal mood and affect. Her behavior is normal.    ED Course  Procedures (including critical care time)  Labs Reviewed  CBC WITH DIFFERENTIAL - Abnormal; Notable for the following:    Platelets 94 (*)    Neutrophils Relative % 86 (*)    Lymphocytes Relative 10 (*)    All other components within normal limits  BASIC METABOLIC PANEL - Abnormal; Notable for the following:    GFR calc non Af Amer 65 (*)    GFR calc Af Amer 76 (*)    All other  components within normal limits  PRO B NATRIURETIC PEPTIDE - Abnormal; Notable for the following:    Pro B Natriuretic peptide (BNP) 5147.0 (*)    All other components within normal limits  PROTIME-INR - Abnormal; Notable for the following:    Prothrombin Time 22.3 (*)    INR 2.05 (*)    All other components within normal limits  POCT I-STAT TROPONIN I   Mr Card Morphology Wo/w Cm  11/17/2012   Cardiac MRI:  Indication: R/O infiltrative disease  Protocol:  The patient was scanned on a 1.5 Tesla GE magnet.  A dedicated cardiac coil was used.  Functional imaging was done using Fiesta sequences.  2,3 and 4 chamber views were done to assess RWMA;s.  Quantitative  EF was calculated using Circle software on a dedicated work station.  The patient received 23 cc of Multihance. After 10 minutes inversion recovery sequences were done to assess for infarct or scar  Findings:  The was severe LVH.  Septal thickness was 17 mm with small LV cavity size.  There was moderate LAE and severe RAE.  The RV was moderately dilated but still contractile.  There was no ASD or VSD.  There was significant AV valve regurgitation of both mitral and tricuspid valves.  The quantitative EF was 62% ( ESV 43 cc EDV 114 cc SV 71 cc)  There were no RWMA;s.  The myocardium was easy to null.  There was no scar or infiltration seen  Impression:     1)    No LV myocardial scar or infiltration        2)    EF 62% no RWMA;s 3)    Moderate RV dilatation 4)    Biatrial enlargement 5)    AV valve regurgitation  Jenkins Rouge MD [facility]   Original Report Authenticated By: Jenkins Rouge, M.D.     1. Acute diastolic CHF (congestive heart failure)   2. Atrial fibrillation   3. Pulmonary hypertension   4. Thrombocytopenia       MDM  62VO F w/ diastolic CHF, atrial fib and severe pulmonary HTN, presents w/ worse than baseline exertional SOB.  Afebrile, mildly labored respirations, diffuse crackles, stable peripheral edema on exam.  CXR and BNP  consistent w/ CHF exacerbation.  Pt received IV lasix.  [facility] consulted for admission.  Remer Macho, PA-C 11/18/12 [phone]

## 2012-11-18 NOTE — Progress Notes (Signed)
Subjective: Patient seen and examined, admitted with diastolic CHF. Had recent cardiac cath. Filed Vitals:   11/18/12 1352  BP: 96/47  Pulse: 68  Temp: 97.6 F (36.4 C)  Resp: 22    Chest: Bibasilar crackles Heart : S1S2 RRR Abdomen: Soft, nontender Ext : No edema Neuro: Alert, oriented x 3  A/P Principal Problem:  Acute hypoxic respiratory failure in the setting of acute diastolic CHF (congestive heart failure)  - appreciate cardiology consult. ED physician has consulted cardiology to follow up in am  - BNP on this admission is 5147 which is above recent value BNP 11/01/2012 1198.  - started lasix 40 mg IV Q 12 hours, strict intake/output/ daily weight and correct electrolytes as needed.  - continue metoprolol, norvasc and losartan  - continue crestor  -will call cardiology consult   Thrombocytopenia  - chronic, in 09/2012 platelet count 73 and on this admission 94  - no acute bleed  - continue to monitor platelet count  - of note, in 2013 patient did follow up with Dr. 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 Recommendation was for observation and transfusion in platelet count is less than 50K   Pulmonary arterial hypertension  - appreciate cardiology following  - as mentioned above had recent [place] for evaluation of pulmonary hypertension   Atrial fibrillation  - on coumadin  - continue metoprolol, HR reasonably controlled     [facility] [phone]

## 2012-11-18 NOTE — ED Notes (Signed)
Pt continues to be 95% on non-rebreather.  Pt quickly falls to 88% after taking mask off.  Pts sats also drop while she is talking.

## 2012-11-18 NOTE — Progress Notes (Signed)
ANTICOAGULATION CONSULT NOTE - Initial Consult  Pharmacy Consult for Coumadin Indication: atrial fibrillation  No Known Allergies  Labs:  Recent Labs  11/18/12 0345  HGB 13.5  HCT 42.4  PLT 94*  LABPROT 22.3*  INR 2.05*  CREATININE 0.87    Estimated Creatinine Clearance: 57.1 ml/min (by C-G formula based on Cr of 0.87).   Medical History: Past Medical History  Diagnosis Date  . Atrial fibrillation   . CHF (congestive heart failure)   . Sleep apnea   . Hypertension     pulmonary    Assessment:  38 yof with history of atrial fibrillation on chronic Coumadin PTA. Patient presented 5/23 with SOB, CXR and BNP c/w CHF exacerbation. MD ordered to resume Coumadin per pharmacy dosing.  Patient's PTA Coumadin dose was 2.51m daily except 5 mg on Mondays with last dose on 5/22 and INR on admit = 2.05. Patient with h/o chronic thrombocytopenia and platelet counts of 94K appears to be at baseline.   H/H wnl, no bleeding  Goal of Therapy:  INR 2-3 Monitor platelets by anticoagulation protocol: Yes   Plan:   Coumadin 2.5 mg po x 1 tonight as per home   Daily PT/INR  Pharmacy will f/u  TVanessa [place] PharmD, BCPS Pager: 3[place]#: [phone]

## 2012-11-18 NOTE — Consult Note (Signed)
Advanced Heart Failure Team Consult Note  Referring Physician: [facility] Primary Physician: Primary Cardiologist:  Dr. Aundra Dubin  Reason for Consultation: Heart failure  HPI:    Ms. Vicki [name] is a 72 year old female with past medical history of HTN, chronic respiratory failure (on 4L O2), chronic diastolic CHF (2 D ECHO in 07/2012 showed severe concentric LVH with ED 55 - 60%, moderate to severe TR and PA systolic pressure of 85 mmHg), and chronic atrial fibrillation on coumadin  She recently underwent right heart cath (10/28/2012) by Dr. Aundra Dubin for evaluation of exertional dyspnea and pulmonary hypertension which subsequently revealed severe pulmonary arterial hypertension. Post cath the plan was to start Macitentan (followed soon after by [facility]), continue lasix and begin work-up for PAH with, VQ (negative), PFTs, cMRI and sleep study.  RA mean 13  RV 102/14  PA 104/39, mean 63  PCWP mean 20 (right), mean 23 (left)  Oxygen saturations:  PA 59%  AO 86%  Cardiac Output (Fick) 4.1  Cardiac Index (Fick) 2.3  Cardiac Output (Thermodilution) 2.8  Cardiac Index (Thermodilution) 1.6  PVR: 10.4 WU (Fick), 15 WU (Thermo)  CMRI: EF 62% with severe LVH.  No infiltrative disease. Moderate RV dilation. Severe RAE  She started Macitentan a little over a week ago and began to feel better with less dyspnea and less dizziness. However she noticed that her weight wouldn't come down as easily with her lasix/  Over past few days had worsening SOB with class IIIB-IV symptoms. Went to sleep study on 5/22 and sats down to 81% with orthopnea and PND so brought to [facility] ED this AM. Her BNP was 5147 (last value 5/6 1189). CXR with pulmonary edema and small bilateral effusions. Denies chest pain fever or chills. No Abdominal pain, no nausea or vomiting. No syncope Weight stable 171.   Started on IV lasix 40 Q12. Diuresing fairly well. Breathing getting better. Still on NRB.  Review of Systems: [y] = yes, _0  =  no   General: Weight gain _1 ; Weight loss _2 ; Anorexia _3 ; Fatigue [ y]; Fever _4 ; Chills _5 ; Weakness [ y]  Cardiac: Chest pain/pressure _6 ; Resting SOB Blue.Reese ]; Exertional SOB [ y]; Orthopnea Blue.Reese ]; Pedal Edema Blue.Reese ]; Palpitations _7 ; Syncope _8 ; Presyncope _9 ; Paroxysmal nocturnal dyspnea[ y]  Pulmonary: Cough _10 ; Wheezing_11 ; Hemoptysis_12 ; Sputum _13 ; Snoring _14   GI: Vomiting_15 ; Dysphagia_16 ; Melena_17 ; Hematochezia _18 ; Heartburn_19 ; Abdominal pain _20 ; Constipation _21 ; Diarrhea _22 ; BRBPR _23   GU: Hematuria_24 ; Dysuria _25 ; Nocturia_26   Vascular: Pain in legs with walking _27 ; Pain in feet with lying flat _28 ; Non-healing sores _29 ; Stroke _30 ; TIA _31 ; Slurred speech _32 ;  Neuro: Headaches_33 ; Vertigo_34 ; Seizures_35 ; Paresthesias_36 ;Blurred vision _37 ; Diplopia _38 ; Vision changes _39   Ortho/Skin: Arthritis Blue.Reese ]; Joint pain [ y]; Muscle pain _40 ; Joint swelling _41 ; Back Pain _42 ; Rash _43   Psych: Depression_44 ; Anxiety_45   Heme: Bleeding problems _46 ; Clotting disorders _47 ; Anemia _48   Endocrine: Diabetes _49 ; Thyroid dysfunction_50   Home Medications Prior to Admission medications   Medication Sig Start Date End Date Taking? Authorizing Provider  amLODipine (NORVASC) 5 MG tablet Take 5 mg by mouth daily.   Yes Historical Provider, MD  furosemide (LASIX) 40 MG tablet Take 1 tablet (40  mg total) by mouth 2 (two) times daily. 10/25/12  Yes Larey Dresser, MD  losartan (COZAAR) 100 MG tablet Take 50 mg by mouth daily.  10/25/12  Yes Larey Dresser, MD  metoprolol (LOPRESSOR) 50 MG tablet Take 75 mg by mouth 2 (two) times daily.   Yes Historical Provider, MD  potassium chloride SA (K-DUR,KLOR-CON) 20 MEQ tablet 2 tablets(total 40 mEq) in the AM and 1 tablet in the PM 10/25/12  Yes Larey Dresser, MD  rosuvastatin (CRESTOR) 20 MG tablet Take 20 mg by mouth daily.   Yes Historical Provider, MD  warfarin (COUMADIN) 5 MG tablet Take 2.5-5 mg by mouth daily at 6 PM. 2.29m daily except  537mon Monday   Yes Historical Provider, MD    Past Medical History: Past Medical History  Diagnosis Date  . Atrial fibrillation   . CHF (congestive heart failure)   . Sleep apnea   . Hypertension     pulmonary    Past Surgical History: History reviewed. No pertinent past surgical history.  Family History: History reviewed. No pertinent family history.  Social History: History   Social History  . Marital Status: Married    Spouse Name: N/A    Number of Children: N/A  . Years of Education: N/A   Social History Main Topics  . Smoking status: Never Smoker   . Smokeless tobacco: None  . Alcohol Use: None  . Drug Use: None  . Sexually Active: None   Other Topics Concern  . None   Social History Narrative  . None    Allergies:  No Known Allergies  Objective:    Vital Signs:   Temp:  [97.6 F (36.4 C)-98.1 F (36.7 C)] 97.6 F (36.4 C) (05/23 1352) Pulse Rate:  [68-81] 68 (05/23 1352) Resp:  [17-24] 22 (05/23 1352) BP: (96-137)/(47-74) 96/47 mmHg (05/23 1352) SpO2:  [95 %-99 %] 97 % (05/23 1352) Weight:  [77.3 kg (170 lb 6.7 oz)-77.565 kg (171 lb)] 77.3 kg (170 lb 6.7 oz) (05/23 0648) Last BM Date: 11/17/12  Weight change: Filed Weights   11/18/12 0648  Weight: 77.3 kg (170 lb 6.7 oz)    Intake/Output:   Intake/Output Summary (Last 24 hours) at 11/18/12 1540 Last data filed at 11/18/12 1353  Gross per 24 hour  Intake    480 ml  Output   1100 ml  Net   -620 ml     Physical Exam: General:  Chronically ill appearing. On facemask HEENT: normal Neck: supple. JVP jaw. Carotids 2+ bilat; no bruits. No lymphadenopathy or thryomegaly appreciated. Cor: PMI nonpalpable. Irregular rate & rhythm. +RV lift. Loud P2. 3/6 TR Lungs: clear Abdomen: soft, nontender, mildly distended. No obvious hepatosplenomegaly. No bruits or masses. Good bowel sounds. Extremities: no cyanosis, clubbing, rash, 1+ edema Neuro: alert & orientedx3, cranial nerves grossly intact.  moves all 4 extremities w/o difficulty. Affect pleasant  Telemetry: Chronic AF  Labs: Basic Metabolic Panel:  Recent Labs Lab 11/18/12 0345 11/18/12 0347  NA 138  --   K 4.0  --   CL 101  --   CO2 31  --   GLUCOSE 98  --   BUN 16  --   CREATININE 0.87  --   CALCIUM 9.2  --   MG  --  2.2    Liver Function Tests: No results found for this basename: AST, ALT, ALKPHOS, BILITOT, PROT, ALBUMIN,  in the last 168 hours No results found for this basename: LIPASE, AMYLASE,  in the last 168 hours No results found for this basename: AMMONIA,  in the last 168 hours  CBC:  Recent Labs Lab 11/18/12 0345  WBC 8.8  NEUTROABS 7.5  HGB 13.5  HCT 42.4  MCV 95.9  PLT 94*    Cardiac Enzymes:  Recent Labs Lab 11/18/12 1105  TROPONINI <0.30    BNP: BNP (last 3 results)  Recent Labs  10/25/12 1443 11/01/12 1511 11/18/12 0347  PROBNP 1916.0* 1198.0* 5147.0*    CBG: No results found for this basename: GLUCAP,  in the last 168 hours  Coagulation Studies:  Recent Labs  11/18/12 0345  LABPROT 22.3*  INR 2.05*    Other results: EKG: AF 77. LVH with repol. occ PVCs.   Imaging: Dg Chest 2 View  11/18/2012   *RADIOLOGY REPORT*  Clinical Data: Shortness of breath.  CHEST - 2 VIEW  Comparison: 10/28/2012.  Findings: The heart is enlarged.  The mediastinal and hilar contours are prominent but unchanged.  There is central vascular congestion, perihilar predominant interstitial edema and small bilateral pleural effusions.  IMPRESSION:  CHF   Original Report Authenticated By: Marijo Sanes, M.D.      Medications:     Current Medications: . amLODipine  5 mg Oral Daily  . atorvastatin  40 mg Oral q1800  . furosemide  40 mg Intravenous Q12H  . losartan  50 mg Oral Daily  . Macitentan  1 tablet Oral Q breakfast  . metoprolol  75 mg Oral BID  . potassium chloride SA  20 mEq Oral Daily  . sodium chloride  3 mL Intravenous Q12H  . warfarin  2.5 mg Oral ONCE-1800  .  Warfarin - Physician Dosing Inpatient   Does not apply q1800     Infusions:      Assessment:   1. A/c diastolic HF 2. A/c respiratory failure 3. Severe PAH with cor pulmonale 4. Chronic AF 5. OSA 6. Thrombocytopenia suspect due to hypersplenism due to elevated R-sided pressures  Plan/Discussion:     Ms. Germer has severe mixed PAH as well as severe diastolic HF. Symptomatically she seems to have had a favorable response to Macitentan but I suspect it may have caused some fluid retention and exacerbated her diastolic failure a bit. Agree with continuing IV diuresis to try to get weight down 3-5 pounds (or more) as renal function tolerates. On d/c would switch lasix to demadex 20 bid. Has f/u with Dr. Aundra Dubin on 5/29 and will likely add PDE-5 inhibitor for combination therapy. She is aware that she may be nearing the point where she may need IV therapy for her PAH. Continue coumadin for PAH.  D/w Dr. Aundra Dubin.   Length of Stay: 0  Glori Bickers 11/18/2012, 3:40 PM  Advanced Heart Failure Team Pager [phone] (M-F; 7a - 4p)  Please contact [facility] for night-coverage after hours (4p -7a ) and weekends on [email or web address]

## 2012-11-18 NOTE — Progress Notes (Signed)
Patient was escorted to the [facility] by EMS at 00:45 on 5/23 due to shortness of breath and distress. Total recording time was from 23:21-00:33 (72 minutes) with only 18 minutes of recorded sleep.

## 2012-11-19 DIAGNOSIS — I509 Heart failure, unspecified: Secondary | ICD-10-CM | POA: Diagnosis not present

## 2012-11-19 DIAGNOSIS — I2789 Other specified pulmonary heart diseases: Secondary | ICD-10-CM | POA: Diagnosis not present

## 2012-11-19 DIAGNOSIS — I5031 Acute diastolic (congestive) heart failure: Secondary | ICD-10-CM | POA: Diagnosis not present

## 2012-11-19 DIAGNOSIS — I4891 Unspecified atrial fibrillation: Secondary | ICD-10-CM | POA: Diagnosis not present

## 2012-11-19 LAB — BASIC METABOLIC PANEL
CO2: 34 mEq/L — ABNORMAL HIGH (ref 19–32)
Calcium: 9 mg/dL (ref 8.4–10.5)
Creatinine, Ser: 0.89 mg/dL (ref 0.50–1.10)
GFR calc Af Amer: 74 mL/min — ABNORMAL LOW (ref >90)

## 2012-11-19 LAB — PROTIME-INR: Prothrombin Time: 22.8 seconds — ABNORMAL HIGH (ref 11.6–15.2)

## 2012-11-19 MED ORDER — FUROSEMIDE 10 MG/ML IJ SOLN
80.0000 mg | Freq: Two times a day (BID) | INTRAMUSCULAR | Status: DC
  Administered 2012-11-19 – 2012-11-21 (×5): 80 mg via INTRAVENOUS
  Filled 2012-11-19 (×7): qty 8

## 2012-11-19 MED ORDER — POTASSIUM CHLORIDE CRYS ER 20 MEQ PO TBCR
40.0000 meq | EXTENDED_RELEASE_TABLET | Freq: Once | ORAL | Status: AC
  Administered 2012-11-19: 40 meq via ORAL
  Filled 2012-11-19: qty 2

## 2012-11-19 MED ORDER — FUROSEMIDE 10 MG/ML IJ SOLN
40.0000 mg | Freq: Once | INTRAMUSCULAR | Status: AC
  Administered 2012-11-19: 40 mg via INTRAVENOUS
  Filled 2012-11-19: qty 4

## 2012-11-19 MED ORDER — WARFARIN SODIUM 2.5 MG PO TABS
2.5000 mg | ORAL_TABLET | Freq: Once | ORAL | Status: AC
  Administered 2012-11-19: 2.5 mg via ORAL
  Filled 2012-11-19: qty 1

## 2012-11-19 MED ORDER — WARFARIN SODIUM 2.5 MG PO TABS
2.5000 mg | ORAL_TABLET | Freq: Once | ORAL | Status: AC
  Filled 2012-11-19: qty 1

## 2012-11-19 MED ORDER — WARFARIN - PHARMACIST DOSING INPATIENT
Freq: Every day | Status: DC
  Administered 2012-11-21: 18:00:00

## 2012-11-19 NOTE — Progress Notes (Signed)
ANTICOAGULATION CONSULT NOTE - Follow Up  Pharmacy Consult for Coumadin Indication: atrial fibrillation  No Known Allergies  Labs:  Recent Labs  11/18/12 0345 11/18/12 1105 11/18/12 1549 11/19/12 0515  HGB 13.5  --   --   --   HCT 42.4  --   --   --   PLT 94*  --   --   --   LABPROT 22.3*  --   --  22.8*  INR 2.05*  --   --  2.11*  CREATININE 0.87  --   --  0.89  TROPONINI  --  <0.30 <0.30  --     Estimated Creatinine Clearance: 55.7 ml/min (by C-G formula based on Cr of 0.89).  Assessment: 45 yof with history of atrial fibrillation on chronic Coumadin PTA - 2.56m daily except 5 mg on Mondays with last dose on 5/22. Patient presented 5/23 with SOB, CXR and BNP c/w CHF exacerbation. INR on admit = 2.05. Patient with h/o chronic thrombocytopenia and platelet counts of 94K appears to be at baseline.   H/H wnl on 5/23, no bleeding reported in chart notes.  INR remains therapeutic at 2.11.  Goal of Therapy:  INR 2-3 Monitor platelets by anticoagulation protocol: Yes   Plan:   Coumadin 2.5 mg po x 1 tonight as per home   Daily PT/INR  JVerdia Kuba PharmD Pager: [phone]/24/2014 9:24 AM

## 2012-11-19 NOTE — Progress Notes (Addendum)
Subjective:  Feels better, less SOB. No CP. No syncope, no dizziness.   Objective:  Vital Signs in the last 24 hours: Temp:  [97.3 F (36.3 C)-98 F (36.7 C)] 97.3 F (36.3 C) (05/24 0422) Pulse Rate:  [64-79] 64 (05/24 0422) Resp:  [16-22] 16 (05/24 0422) BP: (96-117)/(47-68) 99/57 mmHg (05/24 0422) SpO2:  [92 %-97 %] 93 % (05/24 0422) Weight:  [77 kg (169 lb 12.1 oz)] 77 kg (169 lb 12.1 oz) (05/24 0500)  Intake/Output from previous day: 05/23 0701 - 05/24 0700 In: 1060 [P.O.:1060] Out: 1900 [Urine:1900]   Physical Exam: General: Well developed, well nourished, in no acute distress. Head:  Normocephalic and atraumatic. Lungs: Clear to auscultation and percussion. Heart:Irreg irreg, 2/6 SM LLSB, +JVD jaw line.   Abdomen: soft, non-tender, positive bowel sounds. Extremities: No clubbing or cyanosis. 1+ BL edema. Neurologic: Alert and oriented x 3.    Lab Results:  Recent Labs  11/18/12 0345  WBC 8.8  HGB 13.5  PLT 94*    Recent Labs  11/18/12 0345 11/19/12 0515  NA 138 140  K 4.0 3.4*  CL 101 100  CO2 31 34*  GLUCOSE 98 84  BUN 16 21  CREATININE 0.87 0.89    Recent Labs  11/18/12 1105 11/18/12 1549  TROPONINI <0.30 <0.30    Imaging: Dg Chest 2 View  11/18/2012   *RADIOLOGY REPORT*  Clinical Data: Shortness of breath.  CHEST - 2 VIEW  Comparison: 10/28/2012.  Findings: The heart is enlarged.  The mediastinal and hilar contours are prominent but unchanged.  There is central vascular congestion, perihilar predominant interstitial edema and small bilateral pleural effusions.  IMPRESSION:  CHF   Original Report Authenticated By: Marijo Sanes, M.D.    Telemetry: AFIB, PVC's HR 60-70. Personally viewed.   Cardiac Studies:  RHC reviewed. PASP 100. PVR 10  Assessment/Plan:  Principal Problem:   Acute diastolic CHF (congestive heart failure) Active Problems:   Thrombocytopenia   Pulmonary hypertension   Atrial fibrillation   Acute on chronic diastolic  heart failure   Acute cor pulmonale  72 year old with severe pulmonary HTN, diastolic HF, severe LVH, normal EF, AFIB, chronic anticoagulation, PVC's.  1) Cor pulmonale - Improved with IV lasix  - Net out 1.3 L  - Wt down only slightly (0.3Kg)  - Continue with IV lasix today increasing to 80 IV Q 12.   - Noticeable improvement in symptoms already  2) Acute diastolic HF  - as above.   - ARB, Lasix, metop  - MRI reviewed, no infiltrate  3) AFIB  - rate stable, controlled  - INR 2.05  4) Hypokalemia  - I gave extra 40 meq of K-dur  - Monitor with Lasix  5) Severe PHTN  - Macitentan  Will change to Demadex when PO.       Deann Mclaine 11/19/2012, 7:33 AM

## 2012-11-19 NOTE — Progress Notes (Signed)
[facility] PROGRESS NOTE  Vicki [name] ENI:[id number] DOB: 16-Apr-1941 DOA: 11/18/2012 PCP: Dwan Bolt, MD  Assessment/Plan: Acute hypoxic respiratory failure in the setting of acute diastolic CHF (congestive heart failure)  - appreciate cardiology consult. - BNP on this admission is 5147 which is above recent value BNP 11/01/2012 1198.  - started lasix 80 mg IV Q 12 hours, strict intake/output/ daily weight and correct electrolytes as needed.  - continue metoprolol, norvasc and losartan  - continue crestor  - Will be changed to po Demadex at the time of discharge.  Thrombocytopenia  - chronic, in 09/2012 platelet count 73 and on this admission 94  - no acute bleed  - continue to monitor platelet count  - of note, in 2013 patient did follow up with Dr. 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 Recommendation was for observation and transfusion in platelet count is less than 50K   Pulmonary arterial hypertension  - appreciate cardiology following  - as mentioned above had recent [place] for evaluation of pulmonary hypertension   Atrial fibrillation  - on coumadin  - continue metoprolol, HR reasonably controlled  Hypokalemia Will replace the potassium  Code Status: Full code Family Communication: Discussed with patient Disposition Plan:  Home when stable   Consultants:  Cardiology  Procedures:  None  Antibiotics:  None  HPI/Subjective: Patient seen and examined, breathing better.  Objective: Filed Vitals:   11/18/12 2147 11/18/12 2243 11/19/12 0422 11/19/12 0500  BP: 117/59 110/56 99/57   Pulse: 79 78 64   Temp: 98 F (36.7 C)  97.3 F (36.3 C)   TempSrc: Oral  Oral   Resp: 20  16   Height:      Weight:   77 kg (169 lb 12.1 oz) 77 kg (169 lb 12.1 oz)  SpO2: 95%  93%     Intake/Output Summary (Last 24 hours) at 11/19/12 1054 Last data filed at 11/19/12 0900  Gross per 24 hour   Intake   1060 ml  Output   1750 ml  Net   -690 ml   Filed Weights   11/18/12 0648 11/19/12 0422 11/19/12 0500  Weight: 77.3 kg (170 lb 6.7 oz) 77 kg (169 lb 12.1 oz) 77 kg (169 lb 12.1 oz)    Exam:   General:  Appear in no acute distress  Cardiovascular: s1s2 RRR  Respiratory: Bibasilar crackles  Abdomen:  Soft, nontender*  Musculoskeletal: trace edema bilaterally  Data Reviewed: Basic Metabolic Panel:  Recent Labs Lab 11/18/12 0345 11/18/12 0347 11/19/12 0515  NA 138  --  140  K 4.0  --  3.4*  CL 101  --  100  CO2 31  --  34*  GLUCOSE 98  --  84  BUN 16  --  21  CREATININE 0.87  --  0.89  CALCIUM 9.2  --  9.0  MG  --  2.2  --    Liver Function Tests: No results found for this basename: AST, ALT, ALKPHOS, BILITOT, PROT, ALBUMIN,  in the last 168 hours No results found for this basename: LIPASE, AMYLASE,  in the last 168 hours No results found for this basename: AMMONIA,  in the last 168 hours CBC:  Recent Labs Lab 11/18/12 0345  WBC 8.8  NEUTROABS 7.5  HGB 13.5  HCT 42.4  MCV 95.9  PLT 94*   Cardiac Enzymes:  Recent Labs Lab 11/18/12 1105 11/18/12 1549  TROPONINI <0.30 <0.30  BNP (last 3 results)  Recent Labs  10/25/12 1443 11/01/12 1511 11/18/12 0347  PROBNP 1916.0* 1198.0* 5147.0*   CBG: No results found for this basename: GLUCAP,  in the last 168 hours  No results found for this or any previous visit (from the past 240 hour(s)).   Studies: Dg Chest 2 View  11/18/2012   *RADIOLOGY REPORT*  Clinical Data: Shortness of breath.  CHEST - 2 VIEW  Comparison: 10/28/2012.  Findings: The heart is enlarged.  The mediastinal and hilar contours are prominent but unchanged.  There is central vascular congestion, perihilar predominant interstitial edema and small bilateral pleural effusions.  IMPRESSION:  CHF   Original Report Authenticated By: Marijo Sanes, M.D.    Scheduled Meds: . amLODipine  5 mg Oral Daily  . atorvastatin  40 mg Oral  q1800  . furosemide  40 mg Intravenous Once  . furosemide  80 mg Intravenous Q12H  . losartan  50 mg Oral Daily  . Macitentan  1 tablet Oral Q breakfast  . metoprolol  75 mg Oral BID  . potassium chloride SA  20 mEq Oral Daily  . sodium chloride  3 mL Intravenous Q12H  . [COMPLETED] warfarin  2.5 mg Oral ONCE-1800  . warfarin  2.5 mg Oral ONCE-1800  . Warfarin - Pharmacist Dosing Inpatient   Does not apply q1800   Continuous Infusions:   Principal Problem:   Acute diastolic CHF (congestive heart failure) Active Problems:   Thrombocytopenia   Pulmonary hypertension   Atrial fibrillation   Acute on chronic diastolic heart failure   Acute cor pulmonale    Time spent: 30 min    [facility] Pager [phone] If 7PM-7AM, please contact night-coverage at [email or web address], password [facility] 11/19/2012, 10:54 AM  LOS: 1 day

## 2012-11-20 DIAGNOSIS — I509 Heart failure, unspecified: Secondary | ICD-10-CM | POA: Diagnosis not present

## 2012-11-20 DIAGNOSIS — I4891 Unspecified atrial fibrillation: Secondary | ICD-10-CM | POA: Diagnosis not present

## 2012-11-20 DIAGNOSIS — I2789 Other specified pulmonary heart diseases: Secondary | ICD-10-CM | POA: Diagnosis not present

## 2012-11-20 DIAGNOSIS — I5031 Acute diastolic (congestive) heart failure: Secondary | ICD-10-CM | POA: Diagnosis not present

## 2012-11-20 LAB — BASIC METABOLIC PANEL
BUN: 26 mg/dL — ABNORMAL HIGH (ref 6–23)
Chloride: 98 mEq/L (ref 96–112)
Creatinine, Ser: 0.91 mg/dL (ref 0.50–1.10)
GFR calc Af Amer: 72 mL/min — ABNORMAL LOW (ref >90)

## 2012-11-20 MED ORDER — WARFARIN SODIUM 5 MG PO TABS
5.0000 mg | ORAL_TABLET | Freq: Once | ORAL | Status: AC
  Administered 2012-11-20: 5 mg via ORAL
  Filled 2012-11-20: qty 1

## 2012-11-20 NOTE — Progress Notes (Signed)
ANTICOAGULATION CONSULT NOTE - Follow Up  Pharmacy Consult for Coumadin Indication: atrial fibrillation  No Known Allergies  Labs:  Recent Labs  11/18/12 0345 11/18/12 1105 11/18/12 1549 11/19/12 0515 11/20/12 0540  HGB 13.5  --   --   --   --   HCT 42.4  --   --   --   --   PLT 94*  --   --   --   --   LABPROT 22.3*  --   --  22.8* 19.0*  INR 2.05*  --   --  2.11* 1.65*  CREATININE 0.87  --   --  0.89 0.91  TROPONINI  --  <0.30 <0.30  --   --     Estimated Creatinine Clearance: 54.5 ml/min (by C-G formula based on Cr of 0.91).  Assessment: 72 yo [name] with history of atrial fibrillation on chronic Coumadin PTA - 2.69m daily except 5 mg on Mondays with last dose on 5/22. Patient presented 5/23 with SOB, CXR and BNP c/w CHF exacerbation. INR on admit = 2.05. Patient with h/o chronic thrombocytopenia and platelet counts of 94K appears to be at baseline.   H/H wnl on 5/23, no bleeding reported in chart notes.  INR dropped unexpectedly overnight, now subtherapeutic - will use larger dose tonight   Goal of Therapy:  INR 2-3 Monitor platelets by anticoagulation protocol: Yes   Plan:   Increase to warfarin 556mPO x1 tonight   Daily PT/INR  JeVerdia KubaPharmD Pager: [phone]/25/2014 8:51 AM

## 2012-11-20 NOTE — Progress Notes (Signed)
Subjective:  Improving, less SOB, no CP.   Objective:  Vital Signs in the last 24 hours: Temp:  [97.7 F (36.5 C)-98.1 F (36.7 C)] 97.7 F (36.5 C) (05/25 0641) Pulse Rate:  [66-90] 66 (05/25 0641) Resp:  [18-20] 20 (05/25 0641) BP: (85-97)/(47-60) 97/54 mmHg (05/25 0641) SpO2:  [91 %-95 %] 92 % (05/25 0641) Weight:  [77.2 kg (170 lb 3.1 oz)] 77.2 kg (170 lb 3.1 oz) (05/25 0641)  Intake/Output from previous day: 05/24 0701 - 05/25 0700 In: 1240 [P.O.:1240] Out: 2722 [Urine:2722]   Physical Exam: General: Well developed, well nourished, in no acute distress.  Head: Normocephalic and atraumatic.  Lungs: Clear to auscultation and percussion.  Heart:Irreg irreg, 2/6 SM LLSB, +JVD jaw line.  Abdomen: soft, non-tender, positive bowel sounds.  Extremities: No clubbing or cyanosis. 1+ BL edema.  Neurologic: Alert and oriented x 3.     Lab Results:  Recent Labs  11/18/12 0345  WBC 8.8  HGB 13.5  PLT 94*    Recent Labs  11/18/12 0345 11/19/12 0515  NA 138 140  K 4.0 3.4*  CL 101 100  CO2 31 34*  GLUCOSE 98 84  BUN 16 21  CREATININE 0.87 0.89    Recent Labs  11/18/12 1105 11/18/12 1549  TROPONINI <0.30 <0.30    Telemetry: AFIB (no significant pauses), good rate CTL 60-80bpm Personally viewed.   Cardiac Studies:  RHC reviewed. PASP 100. PVR 10 EF normal.   Assessment/Plan:  Principal Problem:   Acute diastolic CHF (congestive heart failure) Active Problems:   Thrombocytopenia   Pulmonary hypertension   Atrial fibrillation   Acute on chronic diastolic heart failure   Acute cor pulmonale   72 year old with severe pulmonary HTN, diastolic HF, severe LVH, normal EF, AFIB, chronic anticoagulation, PVC's.   1) Cor pulmonale  - Improved with IV lasix  - Net out 2.8 L  - Wt down only slightly again  - Continue with IV lasix today increasing to 80 IV Q 12. (As BUN/Creat allows) - Noticeable improvement in symptoms already   2) Acute diastolic HF  -  as above.  - Lasix, metop  - I will hold losartan 35m as BP has been reduced (upper 90 SBP) - Cardiac MRI reviewed, no infiltrate   3) AFIB  - rate stable, controlled  - INR subtx 1.65. Per pharm.   4) Hypokalemia  - I gave extra 40 meq of K-dur yesterday. BMET today pending.  - Monitor with Lasix   5) Severe PHTN  - Macitentan  Will change to Demadex when PO.    SKAINS, MARK 11/20/2012, 7:24 AM

## 2012-11-20 NOTE — Progress Notes (Signed)
[facility] PROGRESS NOTE  Vicki [name] PVV:[id number] DOB: September 02, 1940 DOA: 11/18/2012 PCP: Dwan Bolt, MD  Assessment/Plan: Acute hypoxic respiratory failure in the setting of acute diastolic CHF (congestive heart failure)  - appreciate cardiology consult. - BNP on this admission is 5147 which is above recent value BNP 11/01/2012 1198.  - started lasix 80 mg IV Q 12 hours, strict intake/output/ daily weight and correct electrolytes as needed.  - continue metoprolol,  - continue crestor  - Will be changed to po Demadex at the time of discharge.  Hypotension Losartan held by cardiology  Thrombocytopenia  - chronic, in 09/2012 platelet count 73 and on this admission 94  - no acute bleed  - continue to monitor platelet count  - of note, in 2013 patient did follow up with Dr. 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 Recommendation was for observation and transfusion in platelet count is less than 50K   Pulmonary arterial hypertension  - appreciate cardiology following  - as mentioned above had recent Gantt for evaluation of pulmonary hypertension   Atrial fibrillation  - on coumadin  - continue metoprolol, HR reasonably controlled    Code Status: Full code Family Communication: Discussed with patient Disposition Plan:  Home when stable   Consultants:  Cardiology  Procedures:  None  Antibiotics:  None  HPI/Subjective: Patient seen and examined, breathing better.  Objective: Filed Vitals:   11/19/12 1900 11/19/12 2058 11/20/12 0641 11/20/12 1404  BP: _0 89/50  Pulse: 76 78 66 80  Temp:  98 F (36.7 C) 97.7 F (36.5 C) 97.7 F (36.5 C)  TempSrc:  Oral Oral Oral  Resp:   20 20  Height:      Weight:   77.2 kg (170 lb 3.1 oz)   SpO2:  91% 92% 92%    Intake/Output Summary (Last 24 hours) at 11/20/12 1430 Last data filed at 11/20/12 1413  Gross per 24 hour   Intake   1240 ml  Output   3222 ml  Net  -1982 ml   Filed Weights   11/19/12 0422 11/19/12 0500 11/20/12 0641  Weight: 77 kg (169 lb 12.1 oz) 77 kg (169 lb 12.1 oz) 77.2 kg (170 lb 3.1 oz)    Exam:   General:  Appear in no acute distress  Cardiovascular: s1s2 RRR  Respiratory: Bibasilar crackles  Abdomen:  Soft, nontender*  Musculoskeletal: trace edema bilaterally  Data Reviewed: Basic Metabolic Panel:  Recent Labs Lab 11/18/12 0345 11/18/12 0347 11/19/12 0515 11/20/12 0540  NA 138  --  140 137  K 4.0  --  3.4* 3.5  CL 101  --  100 98  CO2 31  --  34* 32  GLUCOSE 98  --  84 85  BUN 16  --  21 26*  CREATININE 0.87  --  0.89 0.91  CALCIUM 9.2  --  9.0 9.1  MG  --  2.2  --   --    Liver Function Tests: No results found for this basename: AST, ALT, ALKPHOS, BILITOT, PROT, ALBUMIN,  in the last 168 hours No results found for this basename: LIPASE, AMYLASE,  in the last 168 hours No results found for this basename: AMMONIA,  in the last 168 hours CBC:  Recent Labs Lab 11/18/12 0345  WBC 8.8  NEUTROABS 7.5  HGB 13.5  HCT 42.4  MCV 95.9  PLT 94*   Cardiac Enzymes:  Recent Labs Lab  11/18/12 1105 11/18/12 1549  TROPONINI <0.30 <0.30   BNP (last 3 results)  Recent Labs  10/25/12 1443 11/01/12 1511 11/18/12 0347  PROBNP 1916.0* 1198.0* 5147.0*   CBG: No results found for this basename: GLUCAP,  in the last 168 hours  No results found for this or any previous visit (from the past 240 hour(s)).   Studies: No results found.  Scheduled Meds: . amLODipine  5 mg Oral Daily  . atorvastatin  40 mg Oral q1800  . furosemide  80 mg Intravenous Q12H  . Macitentan  1 tablet Oral Q breakfast  . metoprolol  75 mg Oral BID  . potassium chloride SA  20 mEq Oral Daily  . sodium chloride  3 mL Intravenous Q12H  . warfarin  5 mg Oral ONCE-1800  . Warfarin - Pharmacist Dosing Inpatient   Does not apply q1800   Continuous Infusions:   Principal  Problem:   Acute diastolic CHF (congestive heart failure) Active Problems:   Thrombocytopenia   Pulmonary hypertension   Atrial fibrillation   Acute on chronic diastolic heart failure   Acute cor pulmonale    Time spent: 30 min    [place] [facility] Pager [phone] If 7PM-7AM, please contact night-coverage at [email or web address], password [facility] 11/20/2012, 2:30 PM  LOS: 2 days

## 2012-11-21 DIAGNOSIS — D696 Thrombocytopenia, unspecified: Secondary | ICD-10-CM | POA: Diagnosis not present

## 2012-11-21 DIAGNOSIS — I5031 Acute diastolic (congestive) heart failure: Secondary | ICD-10-CM | POA: Diagnosis not present

## 2012-11-21 DIAGNOSIS — I509 Heart failure, unspecified: Secondary | ICD-10-CM | POA: Diagnosis not present

## 2012-11-21 DIAGNOSIS — I2789 Other specified pulmonary heart diseases: Secondary | ICD-10-CM | POA: Diagnosis not present

## 2012-11-21 LAB — CBC
HCT: 39.1 % (ref 36.0–46.0)
MCV: 96.3 fL (ref 78.0–100.0)
RBC: 4.06 MIL/uL (ref 3.87–5.11)
RDW: 15.3 % (ref 11.5–15.5)
WBC: 5.2 10*3/uL (ref 4.0–10.5)

## 2012-11-21 LAB — BASIC METABOLIC PANEL
BUN: 29 mg/dL — ABNORMAL HIGH (ref 6–23)
GFR calc non Af Amer: 60 mL/min — ABNORMAL LOW (ref >90)
Glucose, Bld: 100 mg/dL — ABNORMAL HIGH (ref 70–99)
Potassium: 3.3 mEq/L — ABNORMAL LOW (ref 3.5–5.1)

## 2012-11-21 MED ORDER — WARFARIN SODIUM 6 MG PO TABS
6.0000 mg | ORAL_TABLET | Freq: Once | ORAL | Status: AC
  Administered 2012-11-21: 6 mg via ORAL
  Filled 2012-11-21: qty 1

## 2012-11-21 MED ORDER — POTASSIUM CHLORIDE CRYS ER 20 MEQ PO TBCR
20.0000 meq | EXTENDED_RELEASE_TABLET | Freq: Two times a day (BID) | ORAL | Status: DC
  Administered 2012-11-21: 20 meq via ORAL
  Filled 2012-11-21 (×3): qty 1

## 2012-11-21 NOTE — Progress Notes (Signed)
ANTICOAGULATION CONSULT NOTE - Follow Up  Pharmacy Consult for Coumadin Indication: atrial fibrillation  No Known Allergies  Labs:  Recent Labs  11/18/12 1105 11/18/12 1549 11/19/12 0515 11/20/12 0540 11/21/12 0429  HGB  --   --   --   --  11.9*  HCT  --   --   --   --  39.1  PLT  --   --   --   --  119*  LABPROT  --   --  22.8* 19.0* 17.8*  INR  --   --  2.11* 1.65* 1.51*  CREATININE  --   --  0.89 0.91 0.94  TROPONINI <0.30 <0.30  --   --   --     Estimated Creatinine Clearance: 52.8 ml/min (by C-G formula based on Cr of 0.94).  Assessment: 72 yo F with history of atrial fibrillation on chronic Coumadin PTA - 2.65m daily except 5 mg on Mondays with last dose on 5/22. Patient presented 5/23 with SOB, CXR and BNP c/w CHF exacerbation. INR on admit = 2.05. Patient with h/o chronic thrombocytopenia and platelet counts of 94K ~baseline.   H/H okay, platelets 119K (improving).  No bleeding documented.    Not sure why INR unexpected dropped the past 2 days. Doses charted as given.  Will dose more aggressively  Goal of Therapy:  INR 2-3 Monitor platelets by anticoagulation protocol: Yes   Plan:   Coumadin 6 mg tonight  Daily PT/INR  Pharmacy will f/u  TVanessa [place] PharmD, BCPS Pager: [place]#: [phone]

## 2012-11-21 NOTE — Progress Notes (Signed)
[facility] PROGRESS NOTE  Vicki [name] MHD:[id number] DOB: 26-Mar-1941 DOA: 11/18/2012 PCP: Dwan Bolt, MD  Assessment/Plan: Acute hypoxic respiratory failure in the setting of acute diastolic CHF (congestive heart failure)  - appreciate cardiology consult. - BNP on this admission is 5147 which is above recent value BNP 11/01/2012 1198.  - started lasix 80 mg IV Q 12 hours, strict intake/output/ daily weight and correct electrolytes as needed.  - continue metoprolol,  - continue crestor  - Will be changed to po Demadex at the time of discharge.  Hypotension Losartan held by cardiology  Thrombocytopenia  - chronic, in 09/2012 platelet count 73 and on this admission 94  - no acute bleed  - continue to monitor platelet count  - of note, in 2013 patient did follow up with Dr. 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 Recommendation was for observation and transfusion in platelet count is less than 50K   Pulmonary arterial hypertension  - appreciate cardiology following  - as mentioned above had recent [place] for evaluation of pulmonary hypertension   Atrial fibrillation  - on coumadin  - continue metoprolol, HR reasonably controlled  Hypokalemia Replace potassium  Code Status: Full code Family Communication: Discussed with patient Disposition Plan:  Home when stable   Consultants:  Cardiology  Procedures:  None  Antibiotics:  None  HPI/Subjective: Patient seen and examined, breathing better. No new complaints. On IV lasix   Objective: Filed Vitals:   11/20/12 1404 11/20/12 1633 11/20/12 2108 11/21/12 0500  BP: 89/50 86/46 105/50 87/47  Pulse: 80  71 118  Temp: 97.7 F (36.5 C)  97.6 F (36.4 C) 97.7 F (36.5 C)  TempSrc: Oral  Oral Oral  Resp: _0 Height:      Weight:    77.2 kg (170 lb 3.1 oz)  SpO2: 92%  93% 91%    Intake/Output Summary (Last 24 hours) at 11/21/12  1424 Last data filed at 11/21/12 1227  Gross per 24 hour  Intake    120 ml  Output   2326 ml  Net  -2206 ml   Filed Weights   11/19/12 0500 11/20/12 0641 11/21/12 0500  Weight: 77 kg (169 lb 12.1 oz) 77.2 kg (170 lb 3.1 oz) 77.2 kg (170 lb 3.1 oz)    Exam:   General:  Appear in no acute distress  Cardiovascular: s1s2 RRR  Respiratory: Bibasilar crackles  Abdomen:  Soft, nontender*  Musculoskeletal: trace edema bilaterally  Data Reviewed: Basic Metabolic Panel:  Recent Labs Lab 11/18/12 0345 11/18/12 0347 11/19/12 0515 11/20/12 0540 11/21/12 0429  NA 138  --  140 137 139  K 4.0  --  3.4* 3.5 3.3*  CL 101  --  100 98 98  CO2 31  --  34* 32 35*  GLUCOSE 98  --  84 85 100*  BUN 16  --  21 26* 29*  CREATININE 0.87  --  0.89 0.91 0.94  CALCIUM 9.2  --  9.0 9.1 9.0  MG  --  2.2  --   --   --    Liver Function Tests: No results found for this basename: AST, ALT, ALKPHOS, BILITOT, PROT, ALBUMIN,  in the last 168 hours No results found for this basename: LIPASE, AMYLASE,  in the last 168 hours No results found for this basename: AMMONIA,  in the last 168 hours CBC:  Recent Labs Lab 11/18/12 0345 11/21/12 0429  WBC 8.8 5.2  NEUTROABS 7.5  --   HGB 13.5 11.9*  HCT 42.4 39.1  MCV 95.9 96.3  PLT 94* 119*   Cardiac Enzymes:  Recent Labs Lab 11/18/12 1105 11/18/12 1549  TROPONINI <0.30 <0.30   BNP (last 3 results)  Recent Labs  10/25/12 1443 11/01/12 1511 11/18/12 0347  PROBNP 1916.0* 1198.0* 5147.0*   CBG: No results found for this basename: GLUCAP,  in the last 168 hours  No results found for this or any previous visit (from the past 240 hour(s)).   Studies: No results found.  Scheduled Meds: . amLODipine  5 mg Oral Daily  . atorvastatin  40 mg Oral q1800  . furosemide  80 mg Intravenous Q12H  . Macitentan  1 tablet Oral Q breakfast  . metoprolol  75 mg Oral BID  . potassium chloride SA  20 mEq Oral Daily  . sodium chloride  3 mL  Intravenous Q12H  . warfarin  6 mg Oral ONCE-1800  . Warfarin - Pharmacist Dosing Inpatient   Does not apply q1800   Continuous Infusions:   Principal Problem:   Acute diastolic CHF (congestive heart failure) Active Problems:   Thrombocytopenia   Pulmonary hypertension   Atrial fibrillation   Acute on chronic diastolic heart failure   Acute cor pulmonale    Time spent: 30 min    [place] [facility] Pager [phone] If 7PM-7AM, please contact night-coverage at [email or web address], password [facility] 11/21/2012, 2:24 PM  LOS: 3 days

## 2012-11-22 DIAGNOSIS — I509 Heart failure, unspecified: Secondary | ICD-10-CM | POA: Diagnosis not present

## 2012-11-22 DIAGNOSIS — D696 Thrombocytopenia, unspecified: Secondary | ICD-10-CM | POA: Diagnosis not present

## 2012-11-22 DIAGNOSIS — I4891 Unspecified atrial fibrillation: Secondary | ICD-10-CM | POA: Diagnosis not present

## 2012-11-22 DIAGNOSIS — I2789 Other specified pulmonary heart diseases: Secondary | ICD-10-CM | POA: Diagnosis not present

## 2012-11-22 DIAGNOSIS — I5031 Acute diastolic (congestive) heart failure: Secondary | ICD-10-CM | POA: Diagnosis not present

## 2012-11-22 LAB — BASIC METABOLIC PANEL
CO2: 35 mEq/L — ABNORMAL HIGH (ref 19–32)
Chloride: 99 mEq/L (ref 96–112)
Glucose, Bld: 92 mg/dL (ref 70–99)
Potassium: 3.3 mEq/L — ABNORMAL LOW (ref 3.5–5.1)
Sodium: 140 mEq/L (ref 135–145)

## 2012-11-22 LAB — PROTIME-INR: INR: 2.44 — ABNORMAL HIGH (ref 0.00–1.49)

## 2012-11-22 MED ORDER — TORSEMIDE 20 MG PO TABS
20.0000 mg | ORAL_TABLET | Freq: Two times a day (BID) | ORAL | Status: DC
  Administered 2012-11-22: 20 mg via ORAL
  Filled 2012-11-22 (×2): qty 1

## 2012-11-22 MED ORDER — POTASSIUM CHLORIDE CRYS ER 20 MEQ PO TBCR
40.0000 meq | EXTENDED_RELEASE_TABLET | Freq: Two times a day (BID) | ORAL | Status: DC
  Administered 2012-11-22: 40 meq via ORAL
  Filled 2012-11-22 (×2): qty 2

## 2012-11-22 MED ORDER — WARFARIN 0.5 MG HALF TABLET
0.5000 mg | ORAL_TABLET | Freq: Once | ORAL | Status: DC
  Filled 2012-11-22: qty 1

## 2012-11-22 MED ORDER — TORSEMIDE 20 MG PO TABS: 20.0000 mg | ORAL_TABLET | Freq: Two times a day (BID) | ORAL | Status: DC

## 2012-11-22 MED ORDER — MACITENTAN 10 MG PO TABS: 1.0000 | ORAL_TABLET | Freq: Every day | ORAL | Status: DC

## 2012-11-22 NOTE — Progress Notes (Signed)
Subjective:  Much improved; denies dyspnea, no CP.   Objective:  Vital Signs in the last 24 hours: Temp:  [97.4 F (36.3 C)-97.8 F (36.6 C)] 97.7 F (36.5 C) (05/27 0550) Pulse Rate:  [64-79] 71 (05/27 0550) Resp:  [16-20] 16 (05/27 0550) BP: (90-107)/(48-55) 90/49 mmHg (05/27 0550) SpO2:  [93 %-100 %] 100 % (05/27 0550) Weight:  [168 lb 10.4 oz (76.5 kg)] 168 lb 10.4 oz (76.5 kg) (05/27 0550)  Intake/Output from previous day: 05/26 0701 - 05/27 0700 In: 360 [P.O.:360] Out: 1851 [Urine:1850; Stool:1]   Physical Exam: General: Well developed, well nourished, in no acute distress.  Head: Normocephalic and atraumatic.  Lungs: Mild basilar crackles Heart:Irreg irreg, 2/6 SM LLSB Abdomen: soft, non-tender, positive bowel sounds.  Extremities: No clubbing or cyanosis. 1+ BL edema.  Neurologic: Alert and oriented x 3.     Lab Results:  Recent Labs  11/21/12 0429  WBC 5.2  HGB 11.9*  PLT 119*    Recent Labs  11/21/12 0429 11/22/12 0535  NA 139 140  K 3.3* 3.3*  CL 98 99  CO2 35* 35*  GLUCOSE 100* 92  BUN 29* 25*  CREATININE 0.94 0.79   Telemetry: AFIB (no significant pauses), good rate CTL 60-80bpm Personally viewed.   Cardiac Studies:  RHC reviewed. PASP 100. PVR 10 EF normal.   Assessment/Plan:  Principal Problem:   Acute diastolic CHF (congestive heart failure) Active Problems:   Thrombocytopenia   Pulmonary hypertension   Atrial fibrillation   Acute on chronic diastolic heart failure   Acute cor pulmonale   72 year old with severe pulmonary HTN, diastolic HF, severe LVH, normal EF, AFIB, chronic anticoagulation, PVC's.   1) Cor pulmonale  - Improved with IV lasix  - Plan change to demadex 20 mg po BID  2) Acute diastolic HF  - improved; change to demadex as outlined; BP borderline; DC norvasc  3) AFIB  - rate controlled; continue metoprolol. - INR subtx 1.65. Per pharm.   4) Hypokalemia  - supplement; change to 40 meq po BID.  5)  Severe PHTN  - Macitentan  Further adjustment of pulmonary HTN meds as outpt at time of fu with Dr Aundra Dubin. She can be [facility] and see him on 5/29 as scheduled; check BMET at that time.   Vicki [name] 11/22/2012, 6:40 AM

## 2012-11-22 NOTE — Progress Notes (Signed)
ANTICOAGULATION CONSULT NOTE - Follow Up  Pharmacy Consult for Coumadin Indication: atrial fibrillation  No Known Allergies  Labs:  Recent Labs  11/20/12 0540 11/21/12 0429 11/22/12 0535 11/22/12 1013  HGB  --  11.9*  --   --   HCT  --  39.1  --   --   PLT  --  119*  --   --   LABPROT 19.0* 17.8* 24.8* 25.4*  INR 1.65* 1.51* 2.37* 2.44*  CREATININE 0.91 0.94 0.79  --     Estimated Creatinine Clearance: 61.8 ml/min (by C-G formula based on Cr of 0.79).  Assessment: 72 yo F with history of atrial fibrillation on chronic Coumadin PTA - 2.77m daily except 5 mg on Mondays with last dose on 5/22. Patient presented 5/23 with SOB, CXR and BNP c/w CHF exacerbation. INR on admit = 2.05. Patient with h/o chronic thrombocytopenia and platelet counts of 94K ~baseline.   H/H okay, platelets 119K (improving).  No bleeding documented.    INR now therapeutic and increasing significantly after increased doses.  Goal of Therapy:  INR 2-3 Monitor platelets by anticoagulation protocol: Yes   Plan:   Decrease warfarin 0.566mtoday  Daily PT/INR  ErPeggyann JubaPharmD, BCPS Pager: [phone] 1653/27/2014 10:44 AM

## 2012-11-22 NOTE — Discharge Summary (Signed)
Physician Discharge Summary  RELDA AGOSTO CMK:[id number] DOB: December 30, 1940 DOA: 11/18/2012  PCP: Dwan Bolt, MD  Admit date: 11/18/2012 Discharge date: 11/22/2012  Time spent: 50  minutes  Recommendations for Outpatient Follow-up:  1. Follow up Cardiology on 5/29  Discharge Diagnoses:  Principal Problem:   Acute diastolic CHF (congestive heart failure) Active Problems:   Thrombocytopenia   Pulmonary hypertension   Atrial fibrillation   Acute on chronic diastolic heart failure   Acute cor pulmonale   Discharge Condition: Stable  Diet recommendation: Low salt diet  Filed Weights   11/20/12 0641 11/21/12 0500 11/22/12 0550  Weight: 77.2 kg (170 lb 3.1 oz) 77.2 kg (170 lb 3.1 oz) 76.5 kg (168 lb 10.4 oz)    History of present illness:  72 year old female with past medical history of chronic diastolic CHF (2 D ECHO in 07/2012 showed severe concentric LVH with ED 55 - 60%, moderate to severe TR and PA systolic pressure of 85 mmHg), atrial fibrillation on coumadin, recent right heart cath (10/28/2012) for evaluation of exertional dyspnea and pulmonary hypertension which subsequently revealed severe pulmonary arterial hypertension who presented to [facility] ED with progressively worsening shortness of breath over past 1-2 days. Patient does report chronic shortness of breath but this is worse. Patient was transferred for sleep study center(as part of pulmonary hypertension evaluation) to ED for evaluation of worsening shortness of breath. No reports of chest pain, no fever or chills. No Abdominal pain, no nausea or vomiting. No lightheadedness or loss of consciousness.  In ED, vital signs are stable with BP 122/63 and HR 79. Patient did desaturate to 88% without nasal canula but this has improved with 4 L nasal canula oxygen support. Her BNP was 5147 (last value 5/6 1189). CBC revealed stable chronic low platelet count of 94.   Hospital Course:  Acute hypoxic respiratory failure in the  setting of acute diastolic CHF (congestive heart failure) Patient was started on IV lasix and diuresed very aggressively, at this time she is much better and lasix has been changed to Demadex 20 mg po bid  - continue metoprolol,  - continue crestor   Hypotension  Losartan held by cardiology  Will not give Losartan as she is hypotensive  Thrombocytopenia  - chronic, in 09/2012 platelet count 73 and on this admission 94  - no acute bleed  - continue to monitor platelet count  - of note, in 2013 patient did follow up with Dr. 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   Pulmonary arterial hypertension  - continue macitentan - as mentioned above had recent RHC for evaluation of pulmonary hypertension   Atrial fibrillation  - on coumadin  - continue metoprolol, HR reasonably controlled   Hypokalemia  Replace potassium     Procedures:  None  Consultations:  None  Discharge Exam: Filed Vitals:   11/21/12 1425 11/21/12 2107 11/21/12 2153 11/22/12 0550  BP: 97/55 105/48 107/52 90/49  Pulse: 64 70 79 71  Temp: 97.4 F (36.3 C) 97.8 F (36.6 C)  97.7 F (36.5 C)  TempSrc: Oral Oral  Oral  Resp: _0 Height:      Weight:    76.5 kg (168 lb 10.4 oz)  SpO2: 93% 94%  100%    General: appear in no acute distress Cardiovascular: s1s2 RRR Respiratory: Clear bilaterally Ext : trace edema bilaterally  Discharge Instructions  Discharge Orders   Future Appointments Provider  Department Dept Phone   11/24/2012 2:30 PM Larey Dresser, MD [facility]) [phone]   Future Orders Complete By Expires     Diet - low sodium heart healthy  As directed     Increase activity slowly  As directed         Medication List    STOP taking these medications       furosemide 40 MG tablet  Commonly known as:  LASIX     losartan 100 MG tablet  Commonly known as:  COZAAR      TAKE  these medications       amLODipine 5 MG tablet  Commonly known as:  NORVASC  Take 5 mg by mouth daily.     Macitentan 10 MG Tabs  Commonly known as:  OPSUMIT  Take 1 tablet by mouth daily with breakfast.     metoprolol 50 MG tablet  Commonly known as:  LOPRESSOR  Take 75 mg by mouth 2 (two) times daily.     potassium chloride SA 20 MEQ tablet  Commonly known as:  K-DUR,KLOR-CON  2 tablets(total 40 mEq) in the AM and 1 tablet in the PM     rosuvastatin 20 MG tablet  Commonly known as:  CRESTOR  Take 20 mg by mouth daily.     torsemide 20 MG tablet  Commonly known as:  DEMADEX  Take 1 tablet (20 mg total) by mouth 2 (two) times daily.     warfarin 5 MG tablet  Commonly known as:  COUMADIN  Take 2.5-5 mg by mouth daily at 6 PM. 2.49m daily except 522mon Monday       No Known Allergies    The results of significant diagnostics from this hospitalization (including imaging, microbiology, ancillary and laboratory) are listed below for reference.    Significant Diagnostic Studies: Dg Chest 2 View  11/18/2012   *RADIOLOGY REPORT*  Clinical Data: Shortness of breath.  CHEST - 2 VIEW  Comparison: 10/28/2012.  Findings: The heart is enlarged.  The mediastinal and hilar contours are prominent but unchanged.  There is central vascular congestion, perihilar predominant interstitial edema and small bilateral pleural effusions.  IMPRESSION:  CHF   Original Report Authenticated By: P.Marijo SanesM.D.   Dg Chest 2 View  10/28/2012   *RADIOLOGY REPORT*  Clinical Data: Chest pain, shortness of breath, weakness, correlation with VQ scan  CHEST - 2 VIEW  Comparison: 08/10/2006  Findings: Enlargement of cardiac silhouette with pulmonary vascular congestion. Tortuous aorta. No gross failure or segmental consolidation. No pleural effusion or pneumothorax. Bones unremarkable.  IMPRESSION: Enlargement of cardiac silhouette with pulmonary vascular congestion. No acute abnormalities.   Original Report  Authenticated By: MaLavonia DanaM.D.   Nm Pulmonary Perf And Vent  10/28/2012   *RADIOLOGY REPORT*  Clinical Data:  Concern for chronic pulmonary embolism.  Shortness of breath.  Cardiac catheterization.  NUCLEAR MEDICINE VENTILATION - PERFUSION LUNG SCAN  Technique:  Ventilation images were obtained in multiple projections using inhaled aerosol technetium 99 M DTPA.  Perfusion images were obtained in multiple projections after intravenous injection of Tc-9976mA.  Radiopharmaceuticals:  72m66mc-66m 62m aerosol and 6.0 mCi Tc-66m M38m Comparison: The chest radiograph 10/28/2012  Findings:  Ventilation:  There is heterogeneous ventilation with no focal defect.  Perfusion:   No wedge shaped peripheral perfusion defects to suggest acute pulmonary embolism  IMPRESSION:   No evidence of acute or chronic pulmonary embolism.   Original Report Authenticated By:  Suzy Bouchard, M.D.   Mr Card Morphology Wo/w Cm  11/17/2012   Cardiac MRI:  Indication: R/O infiltrative disease  Protocol:  The patient was scanned on a 1.5 Tesla GE magnet.  A dedicated cardiac coil was used.  Functional imaging was done using Fiesta sequences.  2,3 and 4 chamber views were done to assess RWMA;s.  Quantitative  EF was calculated using Circle software on a dedicated work station.  The patient received 23 cc of Multihance. After 10 minutes inversion recovery sequences were done to assess for infarct or scar  Findings:  The was severe LVH.  Septal thickness was 17 mm with small LV cavity size.  There was moderate LAE and severe RAE.  The RV was moderately dilated but still contractile.  There was no ASD or VSD.  There was significant AV valve regurgitation of both mitral and tricuspid valves.  The quantitative EF was 62% ( ESV 43 cc EDV 114 cc SV 71 cc)  There were no RWMA;s.  The myocardium was easy to null.  There was no scar or infiltration seen  Impression:     1)    No LV myocardial scar or infiltration        2)    EF 62% no RWMA;s 3)     Moderate RV dilatation 4)    Biatrial enlargement 5)    AV valve regurgitation  Jenkins Rouge MD [facility]   Original Report Authenticated By: Jenkins Rouge, M.D.    Microbiology: No results found for this or any previous visit (from the past 240 hour(s)).   Labs: Basic Metabolic Panel:  Recent Labs Lab 11/18/12 0345 11/18/12 0347 11/19/12 0515 11/20/12 0540 11/21/12 0429 11/22/12 0535  NA 138  --  140 137 139 140  K 4.0  --  3.4* 3.5 3.3* 3.3*  CL 101  --  100 98 98 99  CO2 31  --  34* 32 35* 35*  GLUCOSE 98  --  84 85 100* 92  BUN 16  --  21 26* 29* 25*  CREATININE 0.87  --  0.89 0.91 0.94 0.79  CALCIUM 9.2  --  9.0 9.1 9.0 8.8  MG  --  2.2  --   --   --   --    Liver Function Tests: No results found for this basename: AST, ALT, ALKPHOS, BILITOT, PROT, ALBUMIN,  in the last 168 hours No results found for this basename: LIPASE, AMYLASE,  in the last 168 hours No results found for this basename: AMMONIA,  in the last 168 hours CBC:  Recent Labs Lab 11/18/12 0345 11/21/12 0429  WBC 8.8 5.2  NEUTROABS 7.5  --   HGB 13.5 11.9*  HCT 42.4 39.1  MCV 95.9 96.3  PLT 94* 119*   Cardiac Enzymes:  Recent Labs Lab 11/18/12 1105 11/18/12 1549  TROPONINI <0.30 <0.30   BNP: BNP (last 3 results)  Recent Labs  10/25/12 1443 11/01/12 1511 11/18/12 0347  PROBNP 1916.0* 1198.0* 5147.0*   CBG: No results found for this basename: GLUCAP,  in the last 168 hours     Signed:  Nahjae Hoeg S  [facility] 11/22/2012, 12:28 PM

## 2012-11-24 ENCOUNTER — Encounter: Payer: Self-pay | Admitting: *Deleted

## 2012-11-24 ENCOUNTER — Ambulatory Visit (INDEPENDENT_AMBULATORY_CARE_PROVIDER_SITE_OTHER): Payer: Medicare Other | Admitting: Cardiology

## 2012-11-24 VITALS — BP 102/56 | HR 62 | Ht 62.0 in | Wt 171.0 lb

## 2012-11-24 DIAGNOSIS — I272 Pulmonary hypertension, unspecified: Secondary | ICD-10-CM

## 2012-11-24 DIAGNOSIS — I2789 Other specified pulmonary heart diseases: Secondary | ICD-10-CM

## 2012-11-24 DIAGNOSIS — R0602 Shortness of breath: Secondary | ICD-10-CM | POA: Diagnosis not present

## 2012-11-24 DIAGNOSIS — I4891 Unspecified atrial fibrillation: Secondary | ICD-10-CM | POA: Diagnosis not present

## 2012-11-24 DIAGNOSIS — Z87898 Personal history of other specified conditions: Secondary | ICD-10-CM | POA: Diagnosis not present

## 2012-11-24 DIAGNOSIS — I5032 Chronic diastolic (congestive) heart failure: Secondary | ICD-10-CM

## 2012-11-24 DIAGNOSIS — I509 Heart failure, unspecified: Secondary | ICD-10-CM

## 2012-11-24 MED ORDER — AMLODIPINE BESYLATE 2.5 MG PO TABS: 2.5000 mg | ORAL_TABLET | Freq: Every day | ORAL | Status: DC

## 2012-11-24 NOTE — Patient Instructions (Addendum)
Decrease amlodipine to 2.53m daily.   Your physician recommends that you return for lab work in: 1 week--BMET/BNP.  Your physician recommends that you schedule a follow-up appointment in: 2 weeks with Dr. MAundra Dubin  I will be in touch with you about the new medication Adcira 215mdaily.

## 2012-11-25 ENCOUNTER — Encounter: Payer: Self-pay | Admitting: *Deleted

## 2012-11-25 DIAGNOSIS — I5032 Chronic diastolic (congestive) heart failure: Secondary | ICD-10-CM | POA: Insufficient documentation

## 2012-11-25 NOTE — Progress Notes (Signed)
Patient ID: Vicki [name], female   DOB: [birth year], 72 y.o.   MRN: [id number] PCP: Dr. Wilson Singer  72 yo with history of chronic diastolic CHF and chronic atrial fibrillation presents for followup of pulmonary hypertension.  Patient was followed by Dr. Glade Lloyd in the past for chronic atrial fibrillation.  She has been on coumadin.  More recently, she was referred to Dr. Ellyn Hack.  She reports progressive exertional dyspnea since 2011.  This gradually worsened and became quite significant over the last few months.  She used to have significant HTN, but more recently her BP has been on the lower side.  Echo was done in 2/14, showing severe concentric LVH with EF 55-60%, moderately dilated RV, moderate to severe TR, and PA systolic pressure 86 mmHg.  I did a right heart cath in 5/14.  This showed PA pressure 104/36 with PCWP 20, suggesting pulmonary arterial HTN well out of proportion to the mildly elevate wedge pressure.  She was already on amlodipine so I did not do vasodilator testing.  V/Q scan was done, showing no evidence for chronic PEs.  PFTs showed a restrictive defect. Cardiac MRI did not show evidence for amyloid.  I started her on macitentan 10 mg daily.   Initially, she felt better on macitentan.  However, she was admitted in 5/14 from her sleep study due to orthopnea and dyspnea.  She was diuresed for several days and diuretic was switched over to torsemide.  She is now home.  She feels much better.  She can walk 50-100 feet before becoming short of breath (improved).  Weight is down 3 lbs since last appointment.  6 minute walk today: 122 m.    Labs (12/13): HCT 45.1, plts 153, K 3.5, creatinine 1.0 Labs (1/14): BNP 849 Labs (4/14): K 3.1, creatinine 1.0, ANA and anti-SCL-70 antibody negative.  Rheumatoid factor negative.  Serum immunofixation did not show monoclonal light chains.  Labs (5/14): K 3.3, creatinine 0.79, proBNP 5147  PMH: 1. Chronic diastolic CHF: Echo (9/02) with EF 55-60%, severe  LVH (no SAM, no asymmetric hypertrophy, no LVOT gradient), moderate-severe LAE, moderately dilated RV with mildly decreased systolic function, moderate to severe RAE, PA systolic pressure 86 mmHg, moderate-severe TR, moderate MR, trivial pericardial effusion.  Cardiac MRI (5/14): EF 65%, severe LVH, no evidence for amyloidosis (no delayed enhancement, myocardium not difficult to null).  2. Chronic atrial fibrillation since around 2004.  3. HTN: For decades.  4. LHC (2/08) with no significant disease.  5. Chronic thrombocytopenia: ITP 6. Pulmonary arterial HTN: RHC (5/14) with mean RA 13, PA 104/36 (mean 63), mean PCWP 20 on right and 23 on left, CI 2.3 (Fick) and 1.6 (thermo), PVR 10.4 WU (Fick) and 15 WU (thermo).  Vasodilator testing not done as patient was already on amlodipine.  V/Q scan (5/14) with no evidence for chronic PE.  ANA, RF, and anti-SCL70 antibody negative.  PFTs (5/14) with FEV1 60%, FVC 54%, ratio 112%, TLC 61%, DLCO 43% => restrictive defect.   SH: Married, retired from a bank, 3 children, lives in [place].   FH: No heart disease, +HTN.  ROS: All systems reviewed and negative except as per HPI.   Current Outpatient Prescriptions  Medication Sig Dispense Refill  . Macitentan (OPSUMIT) 10 MG TABS Take 1 tablet by mouth daily with breakfast.  30 tablet  0  . metoprolol (LOPRESSOR) 50 MG tablet Take 75 mg by mouth 2 (two) times daily.      . potassium chloride SA (K-DUR,KLOR-CON)  20 MEQ tablet 2 tablets(total 40 mEq) in the AM and 1 tablet in the PM  90 tablet  6  . rosuvastatin (CRESTOR) 20 MG tablet Take 20 mg by mouth daily.      Marland Kitchen torsemide (DEMADEX) 20 MG tablet Take 1 tablet (20 mg total) by mouth 2 (two) times daily.  60 tablet  2  . warfarin (COUMADIN) 5 MG tablet Take 2.5-5 mg by mouth daily at 6 PM. 2.75m daily except 511mon Monday      . amLODipine (NORVASC) 2.5 MG tablet Take 1 tablet (2.5 mg total) by mouth daily.  30 tablet  6   No current facility-administered  medications for this visit.    BP 102/56  Pulse 62  Ht _0  (1.575 m)  Wt 171 lb (77.565 kg)  BMI 31.27 kg/m2  SpO2 91% General: NAD Neck: JVP 8 cm, no thyromegaly or thyroid nodule.  Lungs: Clear to auscultation bilaterally with normal respiratory effort. CV: Nondisplaced PMI.  Heart irregular S1/S2, 2/6 HSM LLSB.  1+ edema 1/3 up lower legs bilaterally.  No carotid bruit.  Normal pedal pulses.  Abdomen: Soft, nontender, no hepatosplenomegaly, no distention.  Neurologic: Alert and oriented x 3.  Psych: Normal affect. Extremities: No clubbing or cyanosis.   Assessment/Plan: 1. Pulmonary HTN: Patient has severe pulmonary arterial HTN out of proportion to mildly elevated PWCP on right heart cath.  PVR is 10.4 WU and CI is 2.3 by Fick (1.6 by thermodilution).  It is possible that long-standing PCWP elevation from diastolic LV dysfunction could lead to pulmonary vascular changes with pulmonary arterial hypertension out of proportion to the PCWP elevation.  However, I cannot rule out possible idiopathic primary pulmonary HTN.  Collagen vascular disease workup is negative (negative RF, ANA and negative anti-SCL-70).  V/Q scan is not suggestive of chronic PEs.  PFTs suggestive restrictive lung disease.  72 years oldecently had a CHF exacerbation a couple weeks after starting macitentan.  It is possible that macitentan could have exacerbated diastolic CHF and increased the PCWP.  Patient is feeling much better with higher doses of diuretics.     - She will need to complete sleep study.  - Continue oxygen for hypoxia.   - Given her severe symptoms and severe pulmonary HTN, I am going to proceed aggressively with combination treatment.  I wiould like to start her on tadalafil 20 mg daily to be titrated to 40 mg daily.  We will start working on this today.  When she starts this med, will need to be careful that pulmonary edema does not worsen.  - Given restrictive PFTs, I will arrange for a high  resolution CT to look for interstitial lung disease.  2. Chronic diastolic CHF: EF preserved on echo with severe LVH.  The LVH is relatively concentric.  It is possible that the LVH is due to years of HTN (has been hypertensive since her 3067s  The cardiac MRI was not suggestive of cardiac amyloidosis.  Though she has primarily pulmonary arterial HTN by RHC, she did have mildly elevated PWCP on RHC.  Will need to follow carefully for elevation in PCWP/worsening pulmonary edema with addition of pulmonary vasodilators.  - Continue torsemide 20 mg bid.  - BMET/BNP in 1 week.  3. Lightheadedness: BP is soft.  I will have her decrease amlodipine to 2.5 mg daily.  4. Chronic atrial fibrillation: She is on metoprolol and coumadin.    Followup in 2 wks.   DaLoralie Champagne/30/2014

## 2012-11-26 DIAGNOSIS — J9600 Acute respiratory failure, unspecified whether with hypoxia or hypercapnia: Secondary | ICD-10-CM | POA: Diagnosis not present

## 2012-11-26 DIAGNOSIS — I4891 Unspecified atrial fibrillation: Secondary | ICD-10-CM | POA: Diagnosis not present

## 2012-11-26 DIAGNOSIS — M25569 Pain in unspecified knee: Secondary | ICD-10-CM | POA: Diagnosis not present

## 2012-11-26 DIAGNOSIS — Z7901 Long term (current) use of anticoagulants: Secondary | ICD-10-CM | POA: Diagnosis not present

## 2012-11-26 DIAGNOSIS — I10 Essential (primary) hypertension: Secondary | ICD-10-CM | POA: Diagnosis not present

## 2012-11-26 DIAGNOSIS — D696 Thrombocytopenia, unspecified: Secondary | ICD-10-CM | POA: Diagnosis not present

## 2012-11-26 DIAGNOSIS — I959 Hypotension, unspecified: Secondary | ICD-10-CM | POA: Diagnosis not present

## 2012-11-26 DIAGNOSIS — I5033 Acute on chronic diastolic (congestive) heart failure: Secondary | ICD-10-CM | POA: Diagnosis not present

## 2012-11-26 DIAGNOSIS — Z9981 Dependence on supplemental oxygen: Secondary | ICD-10-CM | POA: Diagnosis not present

## 2012-11-26 DIAGNOSIS — I270 Primary pulmonary hypertension: Secondary | ICD-10-CM | POA: Diagnosis not present

## 2012-11-28 DIAGNOSIS — I270 Primary pulmonary hypertension: Secondary | ICD-10-CM | POA: Diagnosis not present

## 2012-11-28 DIAGNOSIS — I4891 Unspecified atrial fibrillation: Secondary | ICD-10-CM | POA: Diagnosis not present

## 2012-11-28 DIAGNOSIS — J9600 Acute respiratory failure, unspecified whether with hypoxia or hypercapnia: Secondary | ICD-10-CM | POA: Diagnosis not present

## 2012-11-28 DIAGNOSIS — I5033 Acute on chronic diastolic (congestive) heart failure: Secondary | ICD-10-CM | POA: Diagnosis not present

## 2012-11-28 DIAGNOSIS — I10 Essential (primary) hypertension: Secondary | ICD-10-CM | POA: Diagnosis not present

## 2012-11-28 DIAGNOSIS — I959 Hypotension, unspecified: Secondary | ICD-10-CM | POA: Diagnosis not present

## 2012-11-28 NOTE — Telephone Encounter (Signed)
New Prob      Unable to service pt. Must go to [facility], documentation has bee faxed to them. Pt has been advised.

## 2012-11-30 DIAGNOSIS — I10 Essential (primary) hypertension: Secondary | ICD-10-CM | POA: Diagnosis not present

## 2012-11-30 DIAGNOSIS — I959 Hypotension, unspecified: Secondary | ICD-10-CM | POA: Diagnosis not present

## 2012-11-30 DIAGNOSIS — I270 Primary pulmonary hypertension: Secondary | ICD-10-CM | POA: Diagnosis not present

## 2012-11-30 DIAGNOSIS — I5033 Acute on chronic diastolic (congestive) heart failure: Secondary | ICD-10-CM | POA: Diagnosis not present

## 2012-11-30 DIAGNOSIS — J9600 Acute respiratory failure, unspecified whether with hypoxia or hypercapnia: Secondary | ICD-10-CM | POA: Diagnosis not present

## 2012-11-30 DIAGNOSIS — I4891 Unspecified atrial fibrillation: Secondary | ICD-10-CM | POA: Diagnosis not present

## 2012-11-30 NOTE — Telephone Encounter (Signed)
Appeal letter completed and signed by Dr Aundra Dubin for Opsumit 80m daily [phone]

## 2012-12-01 ENCOUNTER — Other Ambulatory Visit (INDEPENDENT_AMBULATORY_CARE_PROVIDER_SITE_OTHER): Payer: Medicare Other

## 2012-12-01 DIAGNOSIS — R0602 Shortness of breath: Secondary | ICD-10-CM | POA: Diagnosis not present

## 2012-12-01 DIAGNOSIS — I272 Pulmonary hypertension, unspecified: Secondary | ICD-10-CM

## 2012-12-01 DIAGNOSIS — I2789 Other specified pulmonary heart diseases: Secondary | ICD-10-CM | POA: Diagnosis not present

## 2012-12-01 DIAGNOSIS — Z87898 Personal history of other specified conditions: Secondary | ICD-10-CM

## 2012-12-02 LAB — BASIC METABOLIC PANEL
CO2: 28 mEq/L (ref 19–32)
Chloride: 105 mEq/L (ref 96–112)
Potassium: 4 mEq/L (ref 3.5–5.1)
Sodium: 143 mEq/L (ref 135–145)

## 2012-12-06 DIAGNOSIS — I4891 Unspecified atrial fibrillation: Secondary | ICD-10-CM | POA: Diagnosis not present

## 2012-12-06 DIAGNOSIS — I5033 Acute on chronic diastolic (congestive) heart failure: Secondary | ICD-10-CM | POA: Diagnosis not present

## 2012-12-06 DIAGNOSIS — I270 Primary pulmonary hypertension: Secondary | ICD-10-CM | POA: Diagnosis not present

## 2012-12-06 DIAGNOSIS — I10 Essential (primary) hypertension: Secondary | ICD-10-CM | POA: Diagnosis not present

## 2012-12-06 DIAGNOSIS — J9600 Acute respiratory failure, unspecified whether with hypoxia or hypercapnia: Secondary | ICD-10-CM | POA: Diagnosis not present

## 2012-12-06 DIAGNOSIS — I959 Hypotension, unspecified: Secondary | ICD-10-CM | POA: Diagnosis not present

## 2012-12-06 DIAGNOSIS — Z7901 Long term (current) use of anticoagulants: Secondary | ICD-10-CM | POA: Diagnosis not present

## 2012-12-06 NOTE — Telephone Encounter (Signed)
I spoke with Vicki [name] at [facility] [phone]. Application for Vicki [name] is in process--a final decision has not been made. I did receive authorization for Opsumit today through 06/28/13,  authorization # [id number].

## 2012-12-06 NOTE — Telephone Encounter (Signed)
New Problem  Vicki [name] from [place] is inquiring about a prior authorization for the medication  Opsumit.  She asked if you could call her back at [phone] option 1 and ask for [name].

## 2012-12-07 NOTE — Telephone Encounter (Signed)
Spoke with Kayla at [facility]. Referral for adcirca still under review. Lonn Georgia will try to get more information and call me back tomorrow.

## 2012-12-08 DIAGNOSIS — I959 Hypotension, unspecified: Secondary | ICD-10-CM | POA: Diagnosis not present

## 2012-12-08 DIAGNOSIS — I4891 Unspecified atrial fibrillation: Secondary | ICD-10-CM | POA: Diagnosis not present

## 2012-12-08 DIAGNOSIS — J9600 Acute respiratory failure, unspecified whether with hypoxia or hypercapnia: Secondary | ICD-10-CM | POA: Diagnosis not present

## 2012-12-08 DIAGNOSIS — I10 Essential (primary) hypertension: Secondary | ICD-10-CM | POA: Diagnosis not present

## 2012-12-08 DIAGNOSIS — I270 Primary pulmonary hypertension: Secondary | ICD-10-CM | POA: Diagnosis not present

## 2012-12-08 DIAGNOSIS — I5033 Acute on chronic diastolic (congestive) heart failure: Secondary | ICD-10-CM | POA: Diagnosis not present

## 2012-12-08 MED ORDER — TADALAFIL (PAH) 20 MG PO TABS: 20.0000 mg | ORAL_TABLET | Freq: Every day | ORAL | Status: DC

## 2012-12-08 NOTE — Telephone Encounter (Signed)
Dr Aundra Dubin completed [employer] prior authorization for Adcirca 20 mg daily. It has been faxed back to [phone]. (ph [phone]).

## 2012-12-08 NOTE — Telephone Encounter (Signed)
I spoke with Shanon Brow and confirmed prescription for Vicki [name] was received

## 2012-12-08 NOTE — Telephone Encounter (Signed)
Spoke with Larkin Ina from [employer]. He states a prescription needs to be sent in to [place] before authorization can be determined. This has been done. Larkin Ina recommended that I call [phone] to determine if pt's insurance is going to approve this medication. I spoke with Cristie Hem at [phone]. He states an attempt to fax a prior authorization for Adcirca  to Dr Aundra Dubin failed yesterday. He is re faxing a prior authorization form today.

## 2012-12-08 NOTE — Telephone Encounter (Signed)
New Problem  Vicki [name] has a question about a prescription for ADCIRCA 20 MG.  She asked if you could call her back.

## 2012-12-08 NOTE — Telephone Encounter (Signed)
I have received prior authorization request form for Adcirca for Dr Aundra Dubin to complete.

## 2012-12-09 ENCOUNTER — Ambulatory Visit: Payer: Medicare Other | Admitting: Cardiology

## 2012-12-12 NOTE — Telephone Encounter (Signed)
I received authorization for Adcirca 39m daily from [place] Pt is aware.

## 2012-12-13 DIAGNOSIS — I10 Essential (primary) hypertension: Secondary | ICD-10-CM | POA: Diagnosis not present

## 2012-12-13 DIAGNOSIS — I5033 Acute on chronic diastolic (congestive) heart failure: Secondary | ICD-10-CM | POA: Diagnosis not present

## 2012-12-13 DIAGNOSIS — J9600 Acute respiratory failure, unspecified whether with hypoxia or hypercapnia: Secondary | ICD-10-CM | POA: Diagnosis not present

## 2012-12-13 DIAGNOSIS — I959 Hypotension, unspecified: Secondary | ICD-10-CM | POA: Diagnosis not present

## 2012-12-13 DIAGNOSIS — I270 Primary pulmonary hypertension: Secondary | ICD-10-CM | POA: Diagnosis not present

## 2012-12-13 DIAGNOSIS — I4891 Unspecified atrial fibrillation: Secondary | ICD-10-CM | POA: Diagnosis not present

## 2012-12-15 DIAGNOSIS — E789 Disorder of lipoprotein metabolism, unspecified: Secondary | ICD-10-CM | POA: Diagnosis not present

## 2012-12-16 DIAGNOSIS — I270 Primary pulmonary hypertension: Secondary | ICD-10-CM | POA: Diagnosis not present

## 2012-12-16 DIAGNOSIS — I5033 Acute on chronic diastolic (congestive) heart failure: Secondary | ICD-10-CM | POA: Diagnosis not present

## 2012-12-16 DIAGNOSIS — I4891 Unspecified atrial fibrillation: Secondary | ICD-10-CM | POA: Diagnosis not present

## 2012-12-16 DIAGNOSIS — I959 Hypotension, unspecified: Secondary | ICD-10-CM | POA: Diagnosis not present

## 2012-12-16 DIAGNOSIS — I10 Essential (primary) hypertension: Secondary | ICD-10-CM | POA: Diagnosis not present

## 2012-12-16 DIAGNOSIS — J9600 Acute respiratory failure, unspecified whether with hypoxia or hypercapnia: Secondary | ICD-10-CM | POA: Diagnosis not present

## 2012-12-18 ENCOUNTER — Encounter (HOSPITAL_BASED_OUTPATIENT_CLINIC_OR_DEPARTMENT_OTHER): Payer: Medicare Other

## 2012-12-19 ENCOUNTER — Telehealth: Payer: Self-pay | Admitting: Cardiology

## 2012-12-19 NOTE — Telephone Encounter (Signed)
New Prob     Has some questions regarding a medication she supposed to receive. Please call.

## 2012-12-19 NOTE — Telephone Encounter (Signed)
Spoke with Vicki [name] at [employer] [employer]. He has a prescription for adcirca but is unsure why it has not be sent out to patient. He is going to do some more research on this  and call me back.

## 2012-12-19 NOTE — Telephone Encounter (Signed)
Spoke with patient. Pt states she has not received adcirca 87m daily from pharmacy.

## 2012-12-19 NOTE — Telephone Encounter (Signed)
I spoke with [name] at [facility]. The prescription for Vicki [name] is still being processed and someone from [facility] should be in touch with patient by the end of the day today. Pt is aware of this.

## 2012-12-20 DIAGNOSIS — I4891 Unspecified atrial fibrillation: Secondary | ICD-10-CM | POA: Diagnosis not present

## 2012-12-20 DIAGNOSIS — Z7901 Long term (current) use of anticoagulants: Secondary | ICD-10-CM | POA: Diagnosis not present

## 2012-12-20 DIAGNOSIS — I509 Heart failure, unspecified: Secondary | ICD-10-CM | POA: Diagnosis not present

## 2012-12-21 DIAGNOSIS — I10 Essential (primary) hypertension: Secondary | ICD-10-CM | POA: Diagnosis not present

## 2012-12-21 DIAGNOSIS — I5033 Acute on chronic diastolic (congestive) heart failure: Secondary | ICD-10-CM | POA: Diagnosis not present

## 2012-12-21 DIAGNOSIS — I959 Hypotension, unspecified: Secondary | ICD-10-CM | POA: Diagnosis not present

## 2012-12-21 DIAGNOSIS — I270 Primary pulmonary hypertension: Secondary | ICD-10-CM | POA: Diagnosis not present

## 2012-12-21 DIAGNOSIS — I4891 Unspecified atrial fibrillation: Secondary | ICD-10-CM | POA: Diagnosis not present

## 2012-12-21 DIAGNOSIS — J9600 Acute respiratory failure, unspecified whether with hypoxia or hypercapnia: Secondary | ICD-10-CM | POA: Diagnosis not present

## 2012-12-22 DIAGNOSIS — E789 Disorder of lipoprotein metabolism, unspecified: Secondary | ICD-10-CM | POA: Diagnosis not present

## 2012-12-22 DIAGNOSIS — I270 Primary pulmonary hypertension: Secondary | ICD-10-CM | POA: Diagnosis not present

## 2012-12-22 DIAGNOSIS — I10 Essential (primary) hypertension: Secondary | ICD-10-CM | POA: Diagnosis not present

## 2012-12-25 ENCOUNTER — Ambulatory Visit (HOSPITAL_BASED_OUTPATIENT_CLINIC_OR_DEPARTMENT_OTHER): Payer: Medicare Other | Attending: Cardiology

## 2012-12-25 VITALS — Ht 62.0 in | Wt 167.0 lb

## 2012-12-25 DIAGNOSIS — I4949 Other premature depolarization: Secondary | ICD-10-CM | POA: Insufficient documentation

## 2012-12-25 DIAGNOSIS — G4730 Sleep apnea, unspecified: Secondary | ICD-10-CM | POA: Insufficient documentation

## 2012-12-25 DIAGNOSIS — G4710 Hypersomnia, unspecified: Secondary | ICD-10-CM | POA: Insufficient documentation

## 2012-12-25 DIAGNOSIS — I4891 Unspecified atrial fibrillation: Secondary | ICD-10-CM | POA: Insufficient documentation

## 2012-12-25 DIAGNOSIS — G4733 Obstructive sleep apnea (adult) (pediatric): Secondary | ICD-10-CM

## 2012-12-27 ENCOUNTER — Telehealth: Payer: Self-pay | Admitting: Cardiology

## 2012-12-27 NOTE — Telephone Encounter (Signed)
New Prob     Following up on a fax (faxed over on 6/25) regarding ADCIRCA. Please call.

## 2012-12-27 NOTE — Telephone Encounter (Signed)
Cannot find form on Anne's cart.  Will forward to nurse Lankford for follow up.

## 2012-12-28 NOTE — Telephone Encounter (Signed)
Sarah aware form signed by Dr Aundra Dubin today.

## 2012-12-28 NOTE — Telephone Encounter (Signed)
Dr Aundra Dubin signed form  today. Completed form returned to HIM to be faxed.

## 2012-12-29 ENCOUNTER — Ambulatory Visit: Payer: Medicare Other | Admitting: Cardiology

## 2013-01-04 DIAGNOSIS — G4710 Hypersomnia, unspecified: Secondary | ICD-10-CM

## 2013-01-04 DIAGNOSIS — I4891 Unspecified atrial fibrillation: Secondary | ICD-10-CM

## 2013-01-04 DIAGNOSIS — I4949 Other premature depolarization: Secondary | ICD-10-CM

## 2013-01-04 DIAGNOSIS — G4730 Sleep apnea, unspecified: Secondary | ICD-10-CM | POA: Diagnosis not present

## 2013-01-04 NOTE — Procedures (Signed)
NAME:  [name], [name] NO.:  [id number]  MEDICAL RECORD NO.:  [id number]          PATIENT TYPE:  OUT  LOCATION:  SLEEP CENTER                 FACILITY:  [facility]  PHYSICIAN:  Kathee Delton, MD,FCCPDATE OF BIRTH:  10-02-40  DATE OF STUDY:  12/25/2012                           NOCTURNAL POLYSOMNOGRAM  REFERRING PHYSICIAN:  [name], MD  INDICATION FOR STUDY:  Hypersomnia with sleep apnea.  EPWORTH SLEEPINESS SCORE:  12.  SLEEP ARCHITECTURE:  The patient had total sleep time of only 6.5 minutes with no slow-wave sleep or REM noted.  Sleep onset latency was very prolonged at 186 minutes and sleep efficiency was extremely poor due to lack of total sleep time.  RESPIRATORY DATA:  The patient had no obstructive apneas or hypopneas noted, and also did not have any significant snoring.  OXYGEN DATA:  There was O2 desaturation as low as 86% during the night, and the patient spent 128 minutes less than 88% saturation.  It should be noted that she wore 4 L/minute of nasal oxygen throughout the study.  CARDIAC DATA:  The patient was noted to be in atrial fibrillation with occasional PVC.  MOVEMENT-PARASOMNIA:  The patient had no significant leg jerks or other abnormal behavior seen.  IMPRESSIONS-RECOMMENDATIONS: 1. No clinically significant sleep-disordered breathing noted during     the night, however, the patient only had 6.5 minutes of total sleep     time.  This is inadequate in order to make true assessment of the     presence of obstructive sleep apnea.  If sleep-disordered breathing     is felt to be present, the study can be repeated with a sedative,     hypnotic medication, or the patient can have home sleep testing for     further evaluation.  Clinical correlation is suggested. 2. Oxygen desaturation as low as 86% despite 4 L of nasal cannula     during the night.  The patient overall spent     128 minutes less than 88%. 3. Atrial fibrillation with  occasional premature ventricular     contraction noted.     Kathee Delton, MD,FCCP Diplomate, [place] Board of Sleep Medicine    [facility]/MEDQ  D:  01/04/2013 08:39:34  T:  01/04/2013 09:52:06  Job:  [id number]

## 2013-01-05 ENCOUNTER — Telehealth: Payer: Self-pay | Admitting: *Deleted

## 2013-01-05 NOTE — Telephone Encounter (Signed)
No evidence for sleep apnea, please tell patient. ----- Message ----- From: Kathee Delton, MD Sent: 01/04/2013 10:22 AM To: Larey Dresser, MD  Called patient and she is aware that her sleep study was negative for sleep apnea.

## 2013-01-19 DIAGNOSIS — I4891 Unspecified atrial fibrillation: Secondary | ICD-10-CM | POA: Diagnosis not present

## 2013-01-19 DIAGNOSIS — Z7901 Long term (current) use of anticoagulants: Secondary | ICD-10-CM | POA: Diagnosis not present

## 2013-01-19 DIAGNOSIS — I509 Heart failure, unspecified: Secondary | ICD-10-CM | POA: Diagnosis not present

## 2013-01-26 ENCOUNTER — Encounter: Payer: Self-pay | Admitting: Cardiology

## 2013-01-26 ENCOUNTER — Ambulatory Visit (INDEPENDENT_AMBULATORY_CARE_PROVIDER_SITE_OTHER): Payer: Medicare Other | Admitting: Cardiology

## 2013-01-26 VITALS — BP 114/62 | HR 67 | Ht 62.0 in | Wt 166.0 lb

## 2013-01-26 DIAGNOSIS — R0602 Shortness of breath: Secondary | ICD-10-CM

## 2013-01-26 DIAGNOSIS — I4891 Unspecified atrial fibrillation: Secondary | ICD-10-CM | POA: Diagnosis not present

## 2013-01-26 DIAGNOSIS — I5031 Acute diastolic (congestive) heart failure: Secondary | ICD-10-CM

## 2013-01-26 DIAGNOSIS — I5032 Chronic diastolic (congestive) heart failure: Secondary | ICD-10-CM

## 2013-01-26 DIAGNOSIS — I2789 Other specified pulmonary heart diseases: Secondary | ICD-10-CM | POA: Diagnosis not present

## 2013-01-26 DIAGNOSIS — I509 Heart failure, unspecified: Secondary | ICD-10-CM

## 2013-01-26 DIAGNOSIS — I272 Pulmonary hypertension, unspecified: Secondary | ICD-10-CM

## 2013-01-26 MED ORDER — TADALAFIL (PAH) 20 MG PO TABS: ORAL_TABLET | ORAL | Status: DC

## 2013-01-26 NOTE — Patient Instructions (Addendum)
Increase adcirca (tadfalafil) to 29m daily. This will be 2 of your 294mtablet daily at the same time.  Your physician recommends that you have  lab work today--BMET/BNP.  Schedule an appointment for a HIGH RESOLUTION chest CT without contrast.   Your physician recommends that you schedule a follow-up appointment in: 3 months with Dr McAundra Dubin

## 2013-01-27 LAB — BASIC METABOLIC PANEL
BUN: 18 mg/dL (ref 6–23)
Chloride: 104 mEq/L (ref 96–112)
Creatinine, Ser: 1.1 mg/dL (ref 0.4–1.2)
GFR: 65.56 mL/min (ref >60.00)
Potassium: 4 mEq/L (ref 3.5–5.1)

## 2013-01-27 LAB — BRAIN NATRIURETIC PEPTIDE: Pro B Natriuretic peptide (BNP): 959 pg/mL — ABNORMAL HIGH (ref 0.0–100.0)

## 2013-01-27 NOTE — Progress Notes (Signed)
Patient ID: Vicki [name], female   DOB: March 27, 1941, 72 y.o.   MRN: [id number] PCP: Dr. Wilson Singer  72 yo with history of chronic diastolic CHF and chronic atrial fibrillation presents for followup of pulmonary hypertension.  Patient was followed by Dr. Glade Lloyd in the past for chronic atrial fibrillation.  She has been on coumadin.  More recently, she was referred to Dr. Ellyn Hack.  She reports progressive exertional dyspnea since 2011.  This gradually worsened and became quite significant over the last few months.  She used to have significant HTN, but more recently her BP has been on the lower side.  Echo was done in 2/14, showing severe concentric LVH with EF 55-60%, moderately dilated RV, moderate to severe TR, and PA systolic pressure 86 mmHg.  I did a right heart cath in 5/14.  This showed PA pressure 104/36 with PCWP 20, suggesting pulmonary arterial HTN well out of proportion to the mildly elevate wedge pressure.  She was already on amlodipine so I did not do vasodilator testing.  V/Q scan was done, showing no evidence for chronic PEs.  PFTs showed a restrictive defect. Cardiac MRI did not show evidence for amyloid.  I started her on macitentan 10 mg daily.   Initially, she felt better on macitentan.  However, she was admitted in 5/14 from her sleep study due to orthopnea and dyspnea.  She was diuresed for several days and diuretic was switched over to torsemide.  She felt much better.  At last appointment, I started her on tadalafil 20 mg daily.  She thinks that this has helped.  She can now walk for a block without stopping.  No dyspnea walking around her house.  No chest pain. No lightheadedness/syncope.  Weight is down 5 lbs.   6 minute walk (5/14): 122 m.   6 minute walk (7/14): 152 m  Labs (12/13): HCT 45.1, plts 153, K 3.5, creatinine 1.0 Labs (1/14): BNP 849 Labs (4/14): K 3.1, creatinine 1.0, ANA and anti-SCL-70 antibody negative.  Rheumatoid factor negative.  Serum immunofixation did not show  monoclonal light chains.  Labs (5/14): K 3.3, creatinine 0.79, proBNP 5147 Labs (6/14): K 4, creatinine 0.9, BNP 1001  PMH: 1. Chronic diastolic CHF: Echo (3/29) with EF 55-60%, severe LVH (no SAM, no asymmetric hypertrophy, no LVOT gradient), moderate-severe LAE, moderately dilated RV with mildly decreased systolic function, moderate to severe RAE, PA systolic pressure 86 mmHg, moderate-severe TR, moderate MR, trivial pericardial effusion.  Cardiac MRI (5/14): EF 65%, severe LVH, no evidence for amyloidosis (no delayed enhancement, myocardium not difficult to null).  2. Chronic atrial fibrillation since around 2004.  3. HTN: For decades.  4. LHC (2/08) with no significant disease.  5. Chronic thrombocytopenia: ITP 6. Pulmonary arterial HTN: RHC (5/14) with mean RA 13, PA 104/36 (mean 63), mean PCWP 20 on right and 23 on left, CI 2.3 (Fick) and 1.6 (thermo), PVR 10.4 WU (Fick) and 15 WU (thermo).  Vasodilator testing not done as patient was already on amlodipine.  V/Q scan (5/14) with no evidence for chronic PE.  ANA, RF, and anti-SCL70 antibody negative.  PFTs (5/14) with FEV1 60%, FVC 54%, ratio 112%, TLC 61%, DLCO 43% => restrictive defect.  Sleep study (7/14) with no OSA.   SH: Married, retired from a bank, 3 children, lives in [place].   FH: No heart disease, +HTN.  ROS: All systems reviewed and negative except as per HPI.   Current Outpatient Prescriptions  Medication Sig Dispense Refill  .  amLODipine (NORVASC) 2.5 MG tablet Take 1 tablet (2.5 mg total) by mouth daily.  30 tablet  6  . Macitentan (OPSUMIT) 10 MG TABS Take 1 tablet by mouth daily with breakfast.  30 tablet  0  . metoprolol (LOPRESSOR) 50 MG tablet Take 75 mg by mouth 2 (two) times daily.      . potassium chloride SA (K-DUR,KLOR-CON) 20 MEQ tablet 2 tablets(total 40 mEq) in the AM and 1 tablet in the PM  90 tablet  6  . rosuvastatin (CRESTOR) 20 MG tablet Take 20 mg by mouth daily.      Marland Kitchen torsemide (DEMADEX) 20 MG  tablet Take 1 tablet (20 mg total) by mouth 2 (two) times daily.  60 tablet  2  . warfarin (COUMADIN) 5 MG tablet Take 2.5-5 mg by mouth daily at 6 PM. 2.19m daily except 564mon Monday      . Tadalafil, PAH, 20 MG TABS 2 tablets (total 401mdaily  60 tablet  3  . Tadalafil, PAH, 20 MG TABS 2 tablets (total 73m88maily  60 tablet  11   No current facility-administered medications for this visit.    BP 114/62  Pulse 67  Ht _0  (1.575 m)  Wt 75.297 kg (166 lb)  BMI 30.35 kg/m2  SpO2 93% General: NAD Neck: JVP 7 cm, no thyromegaly or thyroid nodule.  Lungs: Slight dry crackles at bases bilaterally CV: Nondisplaced PMI.  Heart irregular S1/S2, 2/6 HSM LLSB.  1+ ankle edema.   No carotid bruit.  Normal pedal pulses.  Abdomen: Soft, nontender, no hepatosplenomegaly, no distention.  Neurologic: Alert and oriented x 3.  Psych: Normal affect. Extremities: No clubbing or cyanosis.   Assessment/Plan: 1. Pulmonary HTN: Patient has severe pulmonary arterial HTN out of proportion to mildly elevated PWCP on right heart cath.  PVR was 10.4 WU and CI was 2.3 by Fick (1.6 by thermodilution).  It is possible that long-standing PCWP elevation from diastolic LV dysfunction could lead to pulmonary vascular changes with pulmonary arterial hypertension out of proportion to the PCWP elevation.  However, I cannot rule out possible idiopathic primary pulmonary HTN.  Collagen vascular disease workup is negative (negative RF, ANA and negative anti-SCL-70).  V/Q scan is not suggestive of chronic PEs.  PFTs suggestive of restrictive lung disease.  Sleep study did not show OSA.  She is doing well currently on macitentan and tadalafil.  Symptoms and 6 minute walk are improving.  - Continue oxygen for hypoxia.   - She has done well with initiation of tadalafil.  Increase to 40 mg daily now. - Given restrictive PFTs, I will arrange for a high resolution CT to look for interstitial lung disease.  2. Chronic diastolic  CHF: EF preserved on echo with severe LVH.  The LVH is relatively concentric.  It is possible that the LVH is due to years of HTN (has been hypertensive since her 30s)52sThe cardiac MRI was not suggestive of cardiac amyloidosis.  Though she has primarily pulmonary arterial HTN by RHC, she did have mildly elevated PWCP on RHC.  Will need to follow carefully for elevation in PCWP/worsening pulmonary edema with addition of pulmonary vasodilators.  Weight is down 5 lbs.  - Continue torsemide 20 mg bid.  - BMET/BNP now.  3. HTN: BP stable.  Can continue current dose of amlodipine.  4. Chronic atrial fibrillation: She is on metoprolol and coumadin.    Followup in 3 months with 6 min walk.   Vicki [name]  Aundra Dubin 01/27/2013

## 2013-02-01 ENCOUNTER — Ambulatory Visit (INDEPENDENT_AMBULATORY_CARE_PROVIDER_SITE_OTHER)
Admission: RE | Admit: 2013-02-01 | Discharge: 2013-02-01 | Disposition: A | Discharge status: Home / Self Care | Payer: Medicare Other | Source: Ambulatory Visit | Attending: Cardiology | Admitting: Cardiology

## 2013-02-01 DIAGNOSIS — I5031 Acute diastolic (congestive) heart failure: Secondary | ICD-10-CM

## 2013-02-01 DIAGNOSIS — J90 Pleural effusion, not elsewhere classified: Secondary | ICD-10-CM | POA: Diagnosis not present

## 2013-02-01 DIAGNOSIS — R0602 Shortness of breath: Secondary | ICD-10-CM | POA: Diagnosis not present

## 2013-02-01 DIAGNOSIS — I2789 Other specified pulmonary heart diseases: Secondary | ICD-10-CM | POA: Diagnosis not present

## 2013-02-02 DIAGNOSIS — I4891 Unspecified atrial fibrillation: Secondary | ICD-10-CM | POA: Diagnosis not present

## 2013-02-02 DIAGNOSIS — I509 Heart failure, unspecified: Secondary | ICD-10-CM | POA: Diagnosis not present

## 2013-02-02 DIAGNOSIS — Z7901 Long term (current) use of anticoagulants: Secondary | ICD-10-CM | POA: Diagnosis not present

## 2013-02-07 ENCOUNTER — Telehealth: Payer: Self-pay | Admitting: *Deleted

## 2013-02-07 DIAGNOSIS — I272 Pulmonary hypertension, unspecified: Secondary | ICD-10-CM

## 2013-02-07 NOTE — Telephone Encounter (Signed)
Notes Recorded by Larey Dresser, MD on 02/01/2013 at 10:54 PM Difficult exam, possibly some evidence of pulmonary fibrosis but not clear. Think pulmonary evaluation would be reasonable.

## 2013-02-08 ENCOUNTER — Telehealth: Payer: Self-pay | Admitting: *Deleted

## 2013-02-08 NOTE — Telephone Encounter (Signed)
Appointment with Dr. Gwenette Greet 03/14/13 @ 10:45. Patient is aware.

## 2013-02-08 NOTE — Telephone Encounter (Signed)
Dr. Aundra Dubin referred Vicki [name] to [facility]. left message for patient to return my call.

## 2013-02-13 ENCOUNTER — Telehealth: Payer: Self-pay | Admitting: Cardiology

## 2013-02-13 NOTE — Telephone Encounter (Signed)
Error

## 2013-02-23 ENCOUNTER — Telehealth: Payer: Self-pay | Admitting: Cardiology

## 2013-02-23 MED ORDER — TORSEMIDE 20 MG PO TABS: 20.0000 mg | ORAL_TABLET | Freq: Two times a day (BID) | ORAL | Status: DC

## 2013-02-23 NOTE — Telephone Encounter (Signed)
Pt needs refill of torsemide, pt out of med, needs asap, uses [facility]

## 2013-02-24 ENCOUNTER — Other Ambulatory Visit: Payer: Self-pay | Admitting: *Deleted

## 2013-02-24 MED ORDER — TORSEMIDE 20 MG PO TABS: 20.0000 mg | ORAL_TABLET | Freq: Two times a day (BID) | ORAL | Status: DC

## 2013-02-28 NOTE — Telephone Encounter (Signed)
New Problem  Pt is having a hard time getting medication (tadala fil)  that was sent to pharmacy// special medication// believes that a claim was rejected// tried to connect pt to medications dept she refused and states this is a medication that only the nurse could fill.

## 2013-02-28 NOTE — Telephone Encounter (Signed)
Spoke with Sunday Spillers at [facility] [phone]. Prescription for adcirca 76m daily in process, no further information needed from Dr MAundra Dubinto fill this prescription. The prescription has not been processed yet because of a billing/account issue. SSunday Spillerssuggested pt call customer service at [phone] help resolve billing issue. Pt aware she should call customer service to help with this.

## 2013-03-14 ENCOUNTER — Ambulatory Visit (INDEPENDENT_AMBULATORY_CARE_PROVIDER_SITE_OTHER): Payer: Medicare Other | Admitting: Pulmonary Disease

## 2013-03-14 ENCOUNTER — Encounter: Payer: Self-pay | Admitting: Pulmonary Disease

## 2013-03-14 VITALS — BP 112/72 | HR 55 | Temp 98.0°F | Ht 62.5 in | Wt 169.6 lb

## 2013-03-14 DIAGNOSIS — R9389 Abnormal findings on diagnostic imaging of other specified body structures: Secondary | ICD-10-CM | POA: Diagnosis not present

## 2013-03-14 DIAGNOSIS — I509 Heart failure, unspecified: Secondary | ICD-10-CM

## 2013-03-14 NOTE — Assessment & Plan Note (Signed)
The patient has multifactorial chronic respiratory failure, however her most recent CT chest does not show any of the classic findings of interstitial disease or pulmonary fibrosis.  There was no subpleural reticulation, honeycombing, or groundglass opacity.  There was significant mosaicism especially on the expiratory images.  This is consistent with possible airways disease/air trapping.  The patient did have air trapping noted on her lung volumes, and may or may not benefit from a maintenance bronchodilator.  I would like to try her on Spiriva since she did not have a significant response to a beta agonist at the time of her PFTs, and the fact that Spiriva has been shown in multiple studies to improved air trapping.  If she does not see a significant change in her breathing, I would discontinue.

## 2013-03-14 NOTE — Progress Notes (Signed)
  Subjective:    Patient ID: Vicki [name], female    DOB: April 30, 1941, 72 y.o.   MRN: [id number]  HPI The patient is a 72 year old female who I've been asked to see for possible interstitial lung disease.  The patient has dyspnea on exertion for years, but it has been worse since 2012.  She's been diagnosed with significant pulmonary hypertension which is multifactorial, and is now being treated with oxygen as well as vasodilator therapy.  She thinks that she is much improved since being on this treatment, and states that her exercise tolerance is better.  Is any significant cough or mucus production.  She has had recent PFTs that showed no airflow obstruction on the promontory, but does show air trapping on lung volumes.  She also had mild restriction and a severe decrease in diffusion capacity.  She has had a CT chest which showed no definite interstitial disease, and none of the classic findings of pulmonary fibrosis.  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  She has no history of smoking or asthma.  She denies any symptoms of restriction to airflow.  She has never been diagnosed with an autoimmune disease, although she does have a cousin with a history of lupus.  She has had some autoimmune labs sent which were unremarkable.   Review of Systems  Constitutional: Negative for fever and unexpected weight change.  HENT: Negative for ear pain, nosebleeds, congestion, sore throat, rhinorrhea, sneezing, trouble swallowing, dental problem, postnasal drip and sinus pressure.   Eyes: Negative for redness and itching.  Respiratory: Positive for cough and shortness of breath. Negative for chest tightness and wheezing.   Cardiovascular: Positive for palpitations ( irregular heartbeats) and leg swelling ( feet swelling).  Gastrointestinal: Negative for nausea and vomiting.  Genitourinary: Negative for dysuria.  Musculoskeletal: Negative for joint swelling.  Skin: Negative for  rash.  Neurological: Negative for headaches.  Hematological: Does not bruise/bleed easily.  Psychiatric/Behavioral: Negative for dysphoric mood. The patient is not nervous/anxious.        Objective:   Physical Exam Constitutional:  Well developed, no acute distress  HENT:  Nares patent without discharge  Oropharynx without exudate, palate and uvula are normal  Eyes:  Perrla, eomi, no scleral icterus  Neck:  No JVD, no TMG  Cardiovascular:  Normal rate, ?iregular rhythm, no rubs or gallops.  2/6 sem        Intact distal pulses but decreased.  Pulmonary :  Normal breath sounds, no stridor or respiratory distress   No rhonchi or wheezing, very subtle/faint bibasilar crackles.  Abdominal:  Soft, nondistended, bowel sounds present.  No tenderness noted.   Musculoskeletal:  1+ lower extremity edema noted.  Lymph Nodes:  No cervical lymphadenopathy noted  Skin:  No cyanosis noted  Neurologic:  Alert, appropriate, moves all 4 extremities without obvious deficit.         Assessment & Plan:

## 2013-03-14 NOTE — Patient Instructions (Addendum)
Will try you on spiriva one inhalation each am for one month.  Please call with an update.

## 2013-03-15 ENCOUNTER — Encounter: Payer: Self-pay | Admitting: *Deleted

## 2013-03-24 ENCOUNTER — Ambulatory Visit: Payer: Medicare Other | Admitting: Cardiology

## 2013-04-07 ENCOUNTER — Encounter: Payer: Self-pay | Admitting: Cardiology

## 2013-04-10 ENCOUNTER — Telehealth: Payer: Self-pay | Admitting: Pulmonary Disease

## 2013-04-10 MED ORDER — TIOTROPIUM BROMIDE MONOHYDRATE 18 MCG IN CAPS: 18.0000 ug | ORAL_CAPSULE | Freq: Every day | RESPIRATORY_TRACT | Status: DC

## 2013-04-10 NOTE — Telephone Encounter (Signed)
Pt aware of recs and rx sent. Nothing further needed 

## 2013-04-10 NOTE — Telephone Encounter (Signed)
Returning call can be reached at [phone].Vicki [name]

## 2013-04-10 NOTE — Telephone Encounter (Signed)
Per last OV note, pt instructions stated: Will try you on spiriva one inhalation each am for one month. Please call with an update.   Pt called back today and stated she has seen a big improvement in her SOB since starting the spiriva and would like to have an RX sent. Please advise.  Bing, CMA NKDA

## 2013-04-10 NOTE — Telephone Encounter (Signed)
ATC PT NA and no VM WCB

## 2013-04-10 NOTE — Telephone Encounter (Signed)
[place] with me.

## 2013-04-17 ENCOUNTER — Encounter: Payer: Self-pay | Admitting: Cardiology

## 2013-04-17 DIAGNOSIS — E789 Disorder of lipoprotein metabolism, unspecified: Secondary | ICD-10-CM | POA: Diagnosis not present

## 2013-04-17 DIAGNOSIS — Z23 Encounter for immunization: Secondary | ICD-10-CM | POA: Diagnosis not present

## 2013-04-17 DIAGNOSIS — Z7901 Long term (current) use of anticoagulants: Secondary | ICD-10-CM | POA: Diagnosis not present

## 2013-04-17 DIAGNOSIS — I509 Heart failure, unspecified: Secondary | ICD-10-CM | POA: Diagnosis not present

## 2013-04-17 DIAGNOSIS — I4891 Unspecified atrial fibrillation: Secondary | ICD-10-CM | POA: Diagnosis not present

## 2013-04-24 DIAGNOSIS — E789 Disorder of lipoprotein metabolism, unspecified: Secondary | ICD-10-CM | POA: Diagnosis not present

## 2013-04-24 DIAGNOSIS — R0989 Other specified symptoms and signs involving the circulatory and respiratory systems: Secondary | ICD-10-CM | POA: Diagnosis not present

## 2013-04-24 DIAGNOSIS — I2789 Other specified pulmonary heart diseases: Secondary | ICD-10-CM | POA: Diagnosis not present

## 2013-04-24 DIAGNOSIS — R0609 Other forms of dyspnea: Secondary | ICD-10-CM | POA: Diagnosis not present

## 2013-04-24 DIAGNOSIS — I10 Essential (primary) hypertension: Secondary | ICD-10-CM | POA: Diagnosis not present

## 2013-05-01 ENCOUNTER — Encounter: Payer: Self-pay | Admitting: Cardiology

## 2013-05-01 ENCOUNTER — Ambulatory Visit (INDEPENDENT_AMBULATORY_CARE_PROVIDER_SITE_OTHER): Payer: Medicare Other | Admitting: Cardiology

## 2013-05-01 VITALS — BP 120/58 | HR 56 | Ht 62.5 in | Wt 165.6 lb

## 2013-05-01 DIAGNOSIS — R0602 Shortness of breath: Secondary | ICD-10-CM

## 2013-05-01 DIAGNOSIS — I5032 Chronic diastolic (congestive) heart failure: Secondary | ICD-10-CM

## 2013-05-01 DIAGNOSIS — I509 Heart failure, unspecified: Secondary | ICD-10-CM

## 2013-05-01 DIAGNOSIS — I2789 Other specified pulmonary heart diseases: Secondary | ICD-10-CM

## 2013-05-01 DIAGNOSIS — I4891 Unspecified atrial fibrillation: Secondary | ICD-10-CM

## 2013-05-01 DIAGNOSIS — I272 Pulmonary hypertension, unspecified: Secondary | ICD-10-CM

## 2013-05-01 MED ORDER — POTASSIUM CHLORIDE CRYS ER 20 MEQ PO TBCR: EXTENDED_RELEASE_TABLET | ORAL | Status: DC

## 2013-05-01 MED ORDER — TADALAFIL (PAH) 20 MG PO TABS: 40.0000 mg | ORAL_TABLET | Freq: Every day | ORAL | Status: DC

## 2013-05-01 MED ORDER — TORSEMIDE 20 MG PO TABS: ORAL_TABLET | ORAL | Status: DC

## 2013-05-01 NOTE — Patient Instructions (Addendum)
Increase torsemide to 57m two times a day. This will be 1 and 1/2 of a 29mtablet two times a day.   Increase KCL (potassium) to 40 mEq two times a day.  This will be 2 of your 20 mEq tablets two times a day.   Your physician recommends that you return for lab work in: 2 weeks--BMET/BNP.  [place] [facility] [place] [place] [place] Adcirca(tadalafil) 4036maily-this will be 2 of a 17m2mblet. Call [phone] have questions or do not get the increased dose of medication.   Your physician recommends that you schedule a follow-up appointment in: 3 months with Dr McLeAundra DubinYour physician has requested that you have an echocardiogram. Echocardiography is a painless test that uses sound waves to create images of your heart. It provides your doctor with information about the size and shape of your heart and how well your heart's chambers and valves are working. This procedure takes approximately one hour. There are no restrictions for this procedure. IN 3 MONTHS A FEW DAYS BE[facility]

## 2013-05-02 NOTE — Progress Notes (Signed)
Patient ID: Vicki [name], female   DOB: 05/27/1941, 72 y.o.   MRN: [id number] PCP: Dr. Wilson Singer  72 yo with history of chronic diastolic CHF and chronic atrial fibrillation presents for followup of pulmonary hypertension.  Patient was followed by Dr. Glade Lloyd in the past for chronic atrial fibrillation.  She has been on coumadin.  She reports progressive exertional dyspnea since 2011.  This gradually worsened and became quite significant over the last few months.  She used to have significant HTN, but more recently her BP has been on the lower side.  Echo was done in 2/14, showing severe concentric LVH with EF 55-60%, moderately dilated RV, moderate to severe TR, and PA systolic pressure 86 mmHg.  I did a right heart cath in 5/14.  This showed PA pressure 104/36 with PCWP 20, suggesting pulmonary arterial HTN well out of proportion to the mildly elevate wedge pressure.  She was already on amlodipine so I did not do vasodilator testing.  V/Q scan was done, showing no evidence for chronic PEs.  PFTs showed a restrictive defect. Cardiac MRI did not show evidence for amyloid.  I started her on macitentan 10 mg daily.   Initially, she felt better on macitentan.  However, she was admitted in 5/14 from her sleep study due to orthopnea and dyspnea.  She was diuresed for several days and diuretic was switched over to torsemide.  She felt much better.  At a prior appointment, I started her on tadalafil 20 mg daily.  She thinks that this has helped.  She can now walk for a block without stopping and can walk up a flight of steps.  No dyspnea walking around her house.  No chest pain. No lightheadedness/syncope.  Weight is stable.  At last appointment, I wanted her to increase tadalafil to 40 mg daily but she had trouble with the pharmacy and is still taking 20 mg daily.   She saw pulmonary recently after chest CT (showed mosaic attenuation in lungs).  This was thought to be due to air-trapping rather than ILD.  She was  started on Spiriva and thinks this helped her breathing.   ECG: Atrial fibrillation  6 minute walk (5/14): 122 m.   6 minute walk (7/14): 152 m 6 minute walk (10/14): 183 m  Labs (12/13): HCT 45.1, plts 153, K 3.5, creatinine 1.0 Labs (1/14): BNP 849 Labs (4/14): K 3.1, creatinine 1.0, ANA and anti-SCL-70 antibody negative.  Rheumatoid factor negative.  Serum immunofixation did not show monoclonal light chains.  Labs (5/14): K 3.3, creatinine 0.79, proBNP 5147 Labs (6/14): K 4, creatinine 0.9, BNP 1001 Labs (10/14): LDL 53, LDL-P 975, K 3.7, creatinine 1.2  PMH: 1. Chronic diastolic CHF: Echo (0/24) with EF 55-60%, severe LVH (no SAM, no asymmetric hypertrophy, no LVOT gradient), moderate-severe LAE, moderately dilated RV with mildly decreased systolic function, moderate to severe RAE, PA systolic pressure 86 mmHg, moderate-severe TR, moderate MR, trivial pericardial effusion.  Cardiac MRI (5/14): EF 65%, severe LVH, no evidence for amyloidosis (no delayed enhancement, myocardium not difficult to null).  2. Chronic atrial fibrillation since around 2004.  3. HTN: For decades.  4. LHC (2/08) with no significant disease.  5. Chronic thrombocytopenia: ITP 6. Pulmonary arterial HTN: RHC (5/14) with mean RA 13, PA 104/36 (mean 63), mean PCWP 20 on right and 23 on left, CI 2.3 (Fick) and 1.6 (thermo), PVR 10.4 WU (Fick) and 15 WU (thermo).  Vasodilator testing not done as patient was already on amlodipine.  V/Q scan (5/14) with no evidence for chronic PE.  ANA, RF, and anti-SCL70 antibody negative.  PFTs (5/14) with FEV1 60%, FVC 54%, ratio 112%, TLC 61%, DLCO 43% => restrictive defect.  Sleep study (7/14) with no OSA.  CT chest with areas of mosaic attenuation in lungs (saw pulmonary, thought air trapping and not ILD).   SH: Married, retired from a bank, 3 children, lives in [place].   FH: No heart disease, +HTN.  ROS: All systems reviewed and negative except as per HPI.   Current  Outpatient Prescriptions  Medication Sig Dispense Refill  . amLODipine (NORVASC) 2.5 MG tablet Take 1 tablet (2.5 mg total) by mouth daily.  30 tablet  6  . Macitentan (OPSUMIT) 10 MG TABS Take 1 tablet by mouth daily with breakfast.  30 tablet  0  . metoprolol (LOPRESSOR) 50 MG tablet Take 75 mg by mouth 2 (two) times daily.      Marland Kitchen OVER THE COUNTER MEDICATION OXYGEN THERAPY 4L      . rosuvastatin (CRESTOR) 20 MG tablet Take 20 mg by mouth daily.      Marland Kitchen tiotropium (SPIRIVA) 18 MCG inhalation capsule Place 1 capsule (18 mcg total) into inhaler and inhale daily.  30 capsule  6  . warfarin (COUMADIN) 5 MG tablet Take as directed by the coumadin clinic      . potassium chloride SA (K-DUR,KLOR-CON) 20 MEQ tablet 2 tablets (total 40 mEq) two times a day  120 tablet  4  . Tadalafil, PAH, 20 MG TABS Take 2 tablets (40 mg total) by mouth daily.  60 tablet  4  . torsemide (DEMADEX) 20 MG tablet 1 and 1/2 tablets (total 88m) two times a day  90 tablet  4   No current facility-administered medications for this visit.    BP 120/58  Pulse 56  Ht 5' 2.5" (1.588 m)  Wt 75.116 kg (165 lb 9.6 oz)  BMI 29.79 kg/m2  SpO2 90% General: NAD Neck: JVP 8-9 cm, no thyromegaly or thyroid nodule.  Lungs: Slight dry crackles at bases bilaterally CV: Nondisplaced PMI.  Heart irregular S1/S2, 2/6 HSM LLSB.  1+ ankle edema.   No carotid bruit.  Normal pedal pulses.  Abdomen: Soft, nontender, no hepatosplenomegaly, no distention.  Neurologic: Alert and oriented x 3.  Psych: Normal affect. Extremities: No clubbing or cyanosis.   Assessment/Plan: 1. Pulmonary HTN: Patient has severe pulmonary arterial HTN out of proportion to mildly elevated PWCP on right heart cath.  PVR was 10.4 WU and CI was 2.3 by Fick (1.6 by thermodilution).  It is possible that long-standing PCWP elevation from diastolic LV dysfunction could lead to pulmonary vascular changes with pulmonary arterial hypertension out of proportion to the PCWP  elevation.  However, I cannot rule out possible idiopathic primary pulmonary HTN.  Collagen vascular disease workup is negative (negative RF, ANA and negative anti-SCL-70).  V/Q scan is not suggestive of chronic PEs.  PFTs suggestive of restrictive lung disease but CT chest and evaluation by pulmonary did not suggest interstitial lung disease.  Sleep study did not show OSA.  She is doing well currently on macitentan and tadalafil.  Symptoms and 6 minute walk are improving.  - Continue oxygen for hypoxia.   - She has done well with initiation of tadalafil.  Increase to 40 mg daily now => will make sure that she can get the higher dose through the pharmacy. - Will need repeat echo at followup in 3 months.  2. Chronic diastolic  CHF: EF preserved on echo with severe LVH.  The LVH is relatively concentric.  It is possible that the LVH is due to years of HTN (has been hypertensive since her 82s).  The cardiac MRI was not suggestive of cardiac amyloidosis.  Though she has primarily pulmonary arterial HTN by RHC, she did have mildly elevated PWCP on RHC.  JVP is mildly elevated today.  Will need to follow carefully for elevation in PCWP/worsening pulmonary edema with titration of pulmonary vasodilators.  - Since I am increasing tadalafil, I will also increase torsemide to 30 mg bid.   - Increase KCl to 40 bid - BMET/BNP in 2 wks. 3. HTN: BP stable.  Can continue current dose of amlodipine.  4. Chronic atrial fibrillation: She is on metoprolol and coumadin.    Followup in 3 months with 6 min walk.   [name] 05/02/2013

## 2013-05-04 ENCOUNTER — Other Ambulatory Visit: Payer: Self-pay | Admitting: Physician Assistant

## 2013-05-05 ENCOUNTER — Other Ambulatory Visit: Payer: Self-pay | Admitting: *Deleted

## 2013-05-05 DIAGNOSIS — M79609 Pain in unspecified limb: Secondary | ICD-10-CM | POA: Diagnosis not present

## 2013-05-05 DIAGNOSIS — D2370 Other benign neoplasm of skin of unspecified lower limb, including hip: Secondary | ICD-10-CM | POA: Diagnosis not present

## 2013-05-05 DIAGNOSIS — M21619 Bunion of unspecified foot: Secondary | ICD-10-CM | POA: Diagnosis not present

## 2013-05-05 MED ORDER — METOPROLOL TARTRATE 50 MG PO TABS: 75.0000 mg | ORAL_TABLET | Freq: Two times a day (BID) | ORAL | Status: DC

## 2013-05-15 ENCOUNTER — Other Ambulatory Visit (INDEPENDENT_AMBULATORY_CARE_PROVIDER_SITE_OTHER): Payer: Medicare Other

## 2013-05-15 DIAGNOSIS — I2789 Other specified pulmonary heart diseases: Secondary | ICD-10-CM

## 2013-05-15 DIAGNOSIS — R0602 Shortness of breath: Secondary | ICD-10-CM

## 2013-05-15 DIAGNOSIS — I272 Pulmonary hypertension, unspecified: Secondary | ICD-10-CM

## 2013-05-15 LAB — BASIC METABOLIC PANEL
BUN: 17 mg/dL (ref 6–23)
Chloride: 103 mEq/L (ref 96–112)
Creatinine, Ser: 0.9 mg/dL (ref 0.4–1.2)
Potassium: 3.7 mEq/L (ref 3.5–5.1)
Sodium: 140 mEq/L (ref 135–145)

## 2013-05-15 LAB — BRAIN NATRIURETIC PEPTIDE: Pro B Natriuretic peptide (BNP): 832 pg/mL — ABNORMAL HIGH (ref 0.0–100.0)

## 2013-05-29 DIAGNOSIS — Z7901 Long term (current) use of anticoagulants: Secondary | ICD-10-CM | POA: Diagnosis not present

## 2013-05-29 DIAGNOSIS — I509 Heart failure, unspecified: Secondary | ICD-10-CM | POA: Diagnosis not present

## 2013-05-29 DIAGNOSIS — I10 Essential (primary) hypertension: Secondary | ICD-10-CM | POA: Diagnosis not present

## 2013-05-29 DIAGNOSIS — I4891 Unspecified atrial fibrillation: Secondary | ICD-10-CM | POA: Diagnosis not present

## 2013-06-23 ENCOUNTER — Encounter: Payer: Self-pay | Admitting: Cardiology

## 2013-06-30 ENCOUNTER — Other Ambulatory Visit: Payer: Self-pay | Admitting: Cardiology

## 2013-07-03 ENCOUNTER — Telehealth: Payer: Self-pay | Admitting: Emergency Medicine

## 2013-07-03 DIAGNOSIS — I4891 Unspecified atrial fibrillation: Secondary | ICD-10-CM | POA: Diagnosis not present

## 2013-07-03 DIAGNOSIS — I509 Heart failure, unspecified: Secondary | ICD-10-CM | POA: Diagnosis not present

## 2013-07-03 DIAGNOSIS — I10 Essential (primary) hypertension: Secondary | ICD-10-CM | POA: Diagnosis not present

## 2013-07-03 DIAGNOSIS — E7800 Pure hypercholesterolemia, unspecified: Secondary | ICD-10-CM | POA: Diagnosis not present

## 2013-07-03 DIAGNOSIS — Z7901 Long term (current) use of anticoagulants: Secondary | ICD-10-CM | POA: Diagnosis not present

## 2013-07-03 DIAGNOSIS — E789 Disorder of lipoprotein metabolism, unspecified: Secondary | ICD-10-CM | POA: Diagnosis not present

## 2013-07-03 NOTE — Telephone Encounter (Signed)
Pt decided to leave. Please call pt back when samples are ready to be picked up.

## 2013-07-03 NOTE — Telephone Encounter (Signed)
lmomtcb for pt.   Spiriva rx was sent to pharm on 04/10/13.  Is pt having difficulty affording this medication?

## 2013-07-04 MED ORDER — TIOTROPIUM BROMIDE MONOHYDRATE 18 MCG IN CAPS: 18.0000 ug | ORAL_CAPSULE | Freq: Every day | RESPIRATORY_TRACT | Status: DC

## 2013-07-04 NOTE — Telephone Encounter (Signed)
lmtcb x2 

## 2013-07-04 NOTE — Telephone Encounter (Signed)
Patient returning call about sample of spiriva.

## 2013-07-04 NOTE — Telephone Encounter (Signed)
Called and spoke with pt and she stated that she has done the pt assistance forms before but she was having problems with her reimbursement with her insurance and she is trying to get the taken care of now but needed a sample to get her through.  Sample has been left up front and the pt is aware.

## 2013-07-06 ENCOUNTER — Telehealth: Payer: Self-pay | Admitting: *Deleted

## 2013-07-06 NOTE — Addendum Note (Signed)
Addended by: Fernande Boyden on: 07/06/2013 08:20 AM   Modules accepted: Level of Service, SmartSet

## 2013-07-06 NOTE — Telephone Encounter (Signed)
[employer] PA for OPSUMIT 10 mg take one daily with breakfast completed by Dr Aundra Dubin and will fax today.

## 2013-07-18 DIAGNOSIS — H40019 Open angle with borderline findings, low risk, unspecified eye: Secondary | ICD-10-CM | POA: Diagnosis not present

## 2013-07-18 DIAGNOSIS — H2589 Other age-related cataract: Secondary | ICD-10-CM | POA: Diagnosis not present

## 2013-07-31 ENCOUNTER — Encounter: Payer: Self-pay | Admitting: Cardiovascular Disease

## 2013-07-31 ENCOUNTER — Ambulatory Visit (HOSPITAL_COMMUNITY): Payer: Medicare Other | Attending: Cardiovascular Disease | Admitting: Cardiology

## 2013-07-31 DIAGNOSIS — I10 Essential (primary) hypertension: Secondary | ICD-10-CM | POA: Diagnosis not present

## 2013-07-31 DIAGNOSIS — I059 Rheumatic mitral valve disease, unspecified: Secondary | ICD-10-CM | POA: Insufficient documentation

## 2013-07-31 DIAGNOSIS — I5032 Chronic diastolic (congestive) heart failure: Secondary | ICD-10-CM | POA: Diagnosis not present

## 2013-07-31 DIAGNOSIS — R0989 Other specified symptoms and signs involving the circulatory and respiratory systems: Secondary | ICD-10-CM | POA: Insufficient documentation

## 2013-07-31 DIAGNOSIS — I509 Heart failure, unspecified: Secondary | ICD-10-CM | POA: Insufficient documentation

## 2013-07-31 DIAGNOSIS — I379 Nonrheumatic pulmonary valve disorder, unspecified: Secondary | ICD-10-CM | POA: Insufficient documentation

## 2013-07-31 DIAGNOSIS — R0602 Shortness of breath: Secondary | ICD-10-CM

## 2013-07-31 DIAGNOSIS — I4891 Unspecified atrial fibrillation: Secondary | ICD-10-CM | POA: Insufficient documentation

## 2013-07-31 DIAGNOSIS — I270 Primary pulmonary hypertension: Secondary | ICD-10-CM | POA: Diagnosis not present

## 2013-07-31 DIAGNOSIS — I079 Rheumatic tricuspid valve disease, unspecified: Secondary | ICD-10-CM | POA: Insufficient documentation

## 2013-07-31 DIAGNOSIS — I5031 Acute diastolic (congestive) heart failure: Secondary | ICD-10-CM

## 2013-07-31 DIAGNOSIS — R0609 Other forms of dyspnea: Secondary | ICD-10-CM | POA: Diagnosis not present

## 2013-07-31 DIAGNOSIS — I2789 Other specified pulmonary heart diseases: Secondary | ICD-10-CM

## 2013-07-31 DIAGNOSIS — I272 Pulmonary hypertension, unspecified: Secondary | ICD-10-CM

## 2013-07-31 NOTE — Progress Notes (Signed)
Echo performed. 

## 2013-08-02 DIAGNOSIS — E789 Disorder of lipoprotein metabolism, unspecified: Secondary | ICD-10-CM | POA: Diagnosis not present

## 2013-08-03 ENCOUNTER — Other Ambulatory Visit: Payer: Self-pay | Admitting: Cardiology

## 2013-08-04 DIAGNOSIS — M25579 Pain in unspecified ankle and joints of unspecified foot: Secondary | ICD-10-CM | POA: Diagnosis not present

## 2013-08-04 DIAGNOSIS — M659 Synovitis and tenosynovitis, unspecified: Secondary | ICD-10-CM | POA: Diagnosis not present

## 2013-08-04 DIAGNOSIS — M65979 Unspecified synovitis and tenosynovitis, unspecified ankle and foot: Secondary | ICD-10-CM | POA: Diagnosis not present

## 2013-08-04 DIAGNOSIS — M79609 Pain in unspecified limb: Secondary | ICD-10-CM | POA: Diagnosis not present

## 2013-08-07 ENCOUNTER — Ambulatory Visit: Payer: Medicare Other | Admitting: Cardiology

## 2013-08-07 DIAGNOSIS — Z7901 Long term (current) use of anticoagulants: Secondary | ICD-10-CM | POA: Diagnosis not present

## 2013-08-07 DIAGNOSIS — I4891 Unspecified atrial fibrillation: Secondary | ICD-10-CM | POA: Diagnosis not present

## 2013-08-07 DIAGNOSIS — I509 Heart failure, unspecified: Secondary | ICD-10-CM | POA: Diagnosis not present

## 2013-08-07 DIAGNOSIS — I10 Essential (primary) hypertension: Secondary | ICD-10-CM | POA: Diagnosis not present

## 2013-08-08 ENCOUNTER — Telehealth: Payer: Self-pay | Admitting: *Deleted

## 2013-08-08 ENCOUNTER — Other Ambulatory Visit: Payer: Self-pay | Admitting: *Deleted

## 2013-08-08 MED ORDER — MACITENTAN 10 MG PO TABS: 1.0000 | ORAL_TABLET | Freq: Every day | ORAL | Status: DC

## 2013-08-08 NOTE — Telephone Encounter (Signed)
Patient called and states her medication pharmacy ([facility] called and they didn't receive PA to [facility] for patients OPSUMIT sent 07/06/2013, I called and confirmed they didnt receive PA so I did one over the phone with reference #[id number], will notifiy patient, this is urgent with a 24 hour turnaround.

## 2013-08-09 DIAGNOSIS — E789 Disorder of lipoprotein metabolism, unspecified: Secondary | ICD-10-CM | POA: Diagnosis not present

## 2013-08-09 DIAGNOSIS — I2789 Other specified pulmonary heart diseases: Secondary | ICD-10-CM | POA: Diagnosis not present

## 2013-08-09 DIAGNOSIS — J9600 Acute respiratory failure, unspecified whether with hypoxia or hypercapnia: Secondary | ICD-10-CM | POA: Diagnosis not present

## 2013-08-10 ENCOUNTER — Other Ambulatory Visit: Payer: Self-pay | Admitting: *Deleted

## 2013-08-10 MED ORDER — MACITENTAN 10 MG PO TABS: 1.0000 | ORAL_TABLET | Freq: Every day | ORAL | Status: DC

## 2013-08-10 NOTE — Telephone Encounter (Signed)
[employer] denied Opsumit stating patient plan for specialty medications allows 30 tab for 30 days no 90 day allowed, an appeal typed and resent expedited to [place].

## 2013-08-10 NOTE — Telephone Encounter (Signed)
[employer] denied PA for opsumit will appeal with their request 30 days for specialty medications not 90 days.

## 2013-08-11 DIAGNOSIS — S8263XA Displaced fracture of lateral malleolus of unspecified fibula, initial encounter for closed fracture: Secondary | ICD-10-CM | POA: Diagnosis not present

## 2013-08-17 ENCOUNTER — Ambulatory Visit (INDEPENDENT_AMBULATORY_CARE_PROVIDER_SITE_OTHER): Payer: Medicare Other | Admitting: Cardiology

## 2013-08-17 ENCOUNTER — Encounter: Payer: Self-pay | Admitting: Cardiology

## 2013-08-17 ENCOUNTER — Telehealth: Payer: Self-pay | Admitting: *Deleted

## 2013-08-17 VITALS — BP 122/79 | HR 65 | Ht 62.5 in | Wt 169.0 lb

## 2013-08-17 DIAGNOSIS — I4891 Unspecified atrial fibrillation: Secondary | ICD-10-CM

## 2013-08-17 DIAGNOSIS — I2789 Other specified pulmonary heart diseases: Secondary | ICD-10-CM

## 2013-08-17 DIAGNOSIS — I5032 Chronic diastolic (congestive) heart failure: Secondary | ICD-10-CM

## 2013-08-17 DIAGNOSIS — R0602 Shortness of breath: Secondary | ICD-10-CM | POA: Diagnosis not present

## 2013-08-17 DIAGNOSIS — I272 Pulmonary hypertension, unspecified: Secondary | ICD-10-CM

## 2013-08-17 DIAGNOSIS — I509 Heart failure, unspecified: Secondary | ICD-10-CM

## 2013-08-17 NOTE — Patient Instructions (Addendum)
Your physician recommends that you schedule a follow-up appointment in: 1 month  Dr Aundra Dubin would like you to have a fat pad biopsy. This is done in the ultrasound department at [facility].  The X-ray department at the hospital will set this up and call you with the appointment time.  They will give you instructions regarding your Coumadin when they call you.   Your physician recommends that you return for lab work on September 05, 2013--BMP, CBC, PT.  The lab opens at 7:30 AM.   Your physician has requested that you have a cardiac catheterization. Cardiac catheterization is used to diagnose and/or treat various heart conditions. Doctors may recommend this procedure for a number of different reasons. The most common reason is to evaluate chest pain. Chest pain can be a symptom of coronary artery disease (CAD), and cardiac catheterization can show whether plaque is narrowing or blocking your heart's arteries. This procedure is also used to evaluate the valves, as well as measure the blood flow and oxygen levels in different parts of your heart. For further information please visit [email or web address]. Please follow instruction sheet, as given. To be done on September 07, 2013.  Please call us to schedule your catheterization after checking with your family.  Dr. Aundra Dubin would like to do this on September 07, 2013. We will go over all the instructions with you after you call to schedule.

## 2013-08-17 NOTE — Telephone Encounter (Signed)
PA sent to [facility] for patients tadalapil PAH 20 mg.

## 2013-08-18 ENCOUNTER — Telehealth: Payer: Self-pay | Admitting: Cardiology

## 2013-08-18 ENCOUNTER — Other Ambulatory Visit: Payer: Self-pay

## 2013-08-18 ENCOUNTER — Telehealth: Payer: Self-pay | Admitting: *Deleted

## 2013-08-18 DIAGNOSIS — I4891 Unspecified atrial fibrillation: Secondary | ICD-10-CM

## 2013-08-18 DIAGNOSIS — I272 Pulmonary hypertension, unspecified: Secondary | ICD-10-CM

## 2013-08-18 LAB — PROTEIN ELECTROPHORESIS, URINE REFLEX

## 2013-08-18 NOTE — Telephone Encounter (Signed)
Spoke with Dr. Burt Knack (DOD). Ok to hold coumadin for 4 days prior to abd fat pad biopsy on 2/25. Called patient and informed. She verbalizes understanding.  Will take dose tonight then hold until after biopsy. Also called carrie at radiology to inform.

## 2013-08-18 NOTE — Telephone Encounter (Signed)
Spoke to patient Right heart cath scheduled with Dr.McLean 09/07/13 at 10:30 am,arrive at [place] at 8:30 am.Patient will come to office 09/05/13 to have lab work and pick up instructions left at front desk 3rd floor.Patient was told hold Coumadin starting on 09/05/13,last dose 09/04/13.

## 2013-08-18 NOTE — Telephone Encounter (Signed)
Pt saw Dr. Aundra Dubin on Feb. 19, 2015. Dr. Aundra Dubin would like pt to have right heart cath on September 07, 2013.  Pt did not want to schedule until she checked with her family to make sure they were able to take her to hospital for procedure.  She will call back. She is aware she will need labs drawn on September 05, 2013.  She is to stop Coumadin after dose on September 04, 2013 if scheduled for cath on September 07, 2013. I told her we would go over all instructions with her when she called. She will need instruction sheet written and either mailed to her or left at desk to pick up when she comes in for lab work. Pt is also needs to be back on Opsumit and tadalafil. Opsumit has been approved for 30 day supply and should have been mailed to pt (see phone note dated 2/10 for reference number). Lovett Sox, RN has also sent prior auth for [place] (see phone note dated 2/19).

## 2013-08-18 NOTE — Progress Notes (Signed)
Patient ID: Vicki [name], female   DOB: Jun 19, 1941, 73 y.o.   MRN: [id number] PCP: Dr. Wilson Singer  73 yo with history of chronic diastolic CHF and chronic atrial fibrillation presents for followup of pulmonary hypertension.  Patient was followed by Dr. Glade Lloyd in the past for chronic atrial fibrillation.  She has been on coumadin.  She reports progressive exertional dyspnea since 2011.  This gradually worsened and became quite significant over the last few months.  She used to have significant HTN, but more recently her BP has been on the lower side.  Echo was done in 2/14, showing severe concentric LVH with EF 55-60%, moderately dilated RV, moderate to severe TR, and PA systolic pressure 86 mmHg.  I did a right heart cath in 5/14.  This showed PA pressure 104/36 with PCWP 20, suggesting pulmonary arterial HTN well out of proportion to the mildly elevate wedge pressure.  She was already on amlodipine so I did not do vasodilator testing.  V/Q scan was done, showing no evidence for chronic PEs.  PFTs showed a restrictive defect. Cardiac MRI did not show definite evidence for amyloid.  I started her on macitentan 10 mg daily.  Initially, she felt better on macitentan.  However, she was admitted in 5/14 from her sleep study due to orthopnea and dyspnea.  She was diuresed for several days and diuretic was switched over to torsemide.  I next started her on tadalafil 20 mg daily and titrated up to 40 mg daily.  She thinks that this helped.  She saw pulmonary after chest CT (showed mosaic attenuation in lungs).  This was thought to be due to air-trapping rather than ILD.  She was started on Spiriva.   Since last appointment, she had an echocardiogram in 2/15 that showed severe LVH, EF 75%, small pericardial effusion, mildly dilated RV with mildly decreased systolic function, PA systolic pressure 84 mmHg.  She was taking both macitentan and tadalafil at the time.  Since then, she has run out of both medications and is having  trouble getting refills.  She feels worse off these medications.  She is able to walk about 200 feet or so before getting short of breath.  She has no problems walking around her house.  Inclines and stairs cause dyspnea.  Of note, just before running out of her meds[place] there without her oxygen.  She says that she passed out briefly and slumped down.  She fractured her foot with the fall.  6 minute walk (5/14): 122 m.   6 minute walk (7/14): 152 m 6 minute walk (10/14): 183 m  Labs (12/13): HCT 45.1, plts 153, K 3.5, creatinine 1.0 Labs (1/14): BNP 849 Labs (4/14): K 3.1, creatinine 1.0, ANA and anti-SCL-70 antibody negative.  Rheumatoid factor negative.  Serum immunofixation did not show monoclonal light chains.  Labs (5/14): K 3.3, creatinine 0.79, proBNP 5147 Labs (6/14): K 4, creatinine 0.9, BNP 1001 Labs (10/14): LDL 53, LDL-P 975, K 3.7, creatinine 1.2 Labs (11/14): K 3.7, creatinine 0.9, BNP 832  PMH: 1. Chronic diastolic CHF: Echo (8/58) with EF 55-60%, severe LVH (no SAM, no asymmetric hypertrophy, no LVOT gradient), moderate-severe LAE, moderately dilated RV with mildly decreased systolic function, moderate to severe RAE, PA systolic pressure 86 mmHg, moderate-severe TR, moderate MR, trivial pericardial effusion.  Cardiac MRI (5/14): EF 65%, severe LVH, no definite evidence for amyloidosis (no delayed enhancement, myocardium not difficult to null).  Echo (2/15) with EF 75%, severe LVH, grade II diastolic dysfunction, moderate MR, RV mildly dilated with mildly decreased systolic function, moderate TR, PA systolic pressure 84 mmHg, small pericardial effusion.  2. Chronic atrial fibrillation since around 2004.  3. HTN: For decades.  4. LHC (2/08) with no significant disease.  5. Chronic thrombocytopenia: ITP 6. Pulmonary arterial HTN: RHC (5/14) with mean RA 13, PA 104/36 (mean 63), mean PCWP 20 on right and 23 on left, CI 2.3 (Fick) and  1.6 (thermo), PVR 10.4 WU (Fick) and 15 WU (thermo).  Vasodilator testing not done as patient was already on amlodipine.  V/Q scan (5/14) with no evidence for chronic PE.  ANA, RF, and anti-SCL70 antibody negative.  PFTs (5/14) with FEV1 60%, FVC 54%, ratio 112%, TLC 61%, DLCO 43% => restrictive defect.  Sleep study (7/14) with no OSA.  CT chest with areas of mosaic attenuation in lungs (saw pulmonary, thought air trapping and not ILD).   SH: Married, retired from a bank, 3 children, lives in [place].   FH: No heart disease, +HTN.  ROS: All systems reviewed and negative except as per HPI.   Current Outpatient Prescriptions  Medication Sig Dispense Refill  . amLODipine (NORVASC) 2.5 MG tablet TAKE ONE TABLET BY MOUTH ONCE DAILY  30 tablet  0  . HYDROcodone-acetaminophen (NORCO/VICODIN) 5-325 MG per tablet       . Macitentan (OPSUMIT) 10 MG TABS Take 1 tablet by mouth daily with breakfast.  30 tablet  3  . metoprolol (LOPRESSOR) 50 MG tablet Take 1.5 tablets (75 mg total) by mouth 2 (two) times daily.  90 tablet  3  . OVER THE COUNTER MEDICATION OXYGEN THERAPY 4L      . potassium chloride SA (K-DUR,KLOR-CON) 20 MEQ tablet 2 tablets (total 40 mEq) two times a day  120 tablet  4  . rosuvastatin (CRESTOR) 20 MG tablet Take 20 mg by mouth daily.      . Tadalafil, PAH, 20 MG TABS Take 2 tablets (40 mg total) by mouth daily.  60 tablet  4  . tiotropium (SPIRIVA) 18 MCG inhalation capsule Place 1 capsule (18 mcg total) into inhaler and inhale daily.  10 capsule  0  . torsemide (DEMADEX) 20 MG tablet 1 and 1/2 tablets (total 74m) two times a day  90 tablet  4  . warfarin (COUMADIN) 5 MG tablet Take as directed by the coumadin clinic       No current facility-administered medications for this visit.    BP 122/79  Pulse 65  Ht 5' 2.5" (1.588 m)  Wt 76.658 kg (169 lb)  BMI 30.40 kg/m2 General: NAD Neck: JVP 8 cm, no thyromegaly or thyroid nodule.  Lungs: Slight dry crackles at bases  bilaterally CV: Nondisplaced PMI.  Heart irregular S1/S2, 2/6 HSM LLSB.  1+ ankle edema.   No carotid bruit.  Normal pedal pulses.  Abdomen: Soft, nontender, no hepatosplenomegaly, no distention.  Neurologic: Alert and oriented x 3.  Psych: Normal affect. Extremities: No clubbing or cyanosis. Right foot in boot post-fracture.   Assessment/Plan: 1. Pulmonary HTN: Patient has severe pulmonary arterial HTN out of proportion to mildly elevated PWCP on last right heart cath.  PVR was 10.4 WU and CI was 2.3 by Fick (1.6 by thermodilution).  It is possible that long-standing PCWP elevation from diastolic LV dysfunction could lead to pulmonary vascular changes with pulmonary arterial hypertension out of proportion to the PCWP elevation.  However, I cannot rule out possible idiopathic  primary pulmonary HTN.  Collagen vascular disease workup was negative (negative RF, ANA and negative anti-SCL-70).  V/Q scan was not suggestive of chronic PEs.  PFTs were suggestive of restrictive lung disease but CT chest and evaluation by pulmonary did not suggest interstitial lung disease.  Sleep study did not show OSA.  I am concerned that she has not shown a good response to dual therapy with macitentan and tadalafil.  Echo in 8/17 showed PA systolic pressure 86 mmHg while on macitentan and tadalafil.  She is clearly worse today now that she has run out of medications.  - Hold off on 6 minute walk: patient is off her meds and has a broken foot.  - I am going to work on getting her back on macitentan and tadalafil as soon as possible.  After she is back on meds, I am going to repeat a RHC (probably about 3 wks).  2. Chronic diastolic CHF: EF preserved on echo with severe concentric LVH and a small pericardial effusion.  It is possible that the LVH is due to years of HTN.  The cardiac MRI was not definitively suggestive of cardiac amyloidosis.  I am still concerned for amyloidosis given the appearance of the echo.  I will send  SPEP/UPEP (though could be transthyretin amyloid which would not be expected to show M-spike).  I am also going to try to arrange for her to have an abdominal fat pad biopsy with staining for amyloid.   - Continue current torsemide for now.  Check BMET/BNP today. 3. HTN: BP stable.  Can continue current dose of amlodipine.  4. Chronic atrial fibrillation: She is on metoprolol and coumadin.    [name] 08/18/2013

## 2013-08-18 NOTE — Telephone Encounter (Signed)
Patient called no answer. Left message to call me back to schedule right heart cath.

## 2013-08-18 NOTE — Telephone Encounter (Signed)
New Problem:  Per Morey Hummingbird at [facility].. Pt is scheduled for a biopsy on 2/25 and needs to be contacted by our office to stop coumadin for 4 days prior.

## 2013-08-21 ENCOUNTER — Observation Stay (HOSPITAL_COMMUNITY)
Admission: EM | Admit: 2013-08-21 | Discharge: 2013-08-23 | Disposition: A | Discharge status: Home / Self Care | Payer: Medicare Other | Attending: Internal Medicine | Admitting: Internal Medicine

## 2013-08-21 ENCOUNTER — Telehealth: Payer: Self-pay | Admitting: *Deleted

## 2013-08-21 ENCOUNTER — Emergency Department (HOSPITAL_COMMUNITY): Payer: Medicare Other

## 2013-08-21 ENCOUNTER — Encounter (HOSPITAL_COMMUNITY): Payer: Self-pay | Admitting: Pharmacy Technician

## 2013-08-21 ENCOUNTER — Encounter (HOSPITAL_COMMUNITY): Payer: Self-pay | Admitting: Emergency Medicine

## 2013-08-21 ENCOUNTER — Ambulatory Visit (INDEPENDENT_AMBULATORY_CARE_PROVIDER_SITE_OTHER): Payer: Medicare Other | Admitting: *Deleted

## 2013-08-21 ENCOUNTER — Other Ambulatory Visit: Payer: Self-pay | Admitting: Radiology

## 2013-08-21 DIAGNOSIS — I4891 Unspecified atrial fibrillation: Secondary | ICD-10-CM

## 2013-08-21 DIAGNOSIS — I2789 Other specified pulmonary heart diseases: Secondary | ICD-10-CM

## 2013-08-21 DIAGNOSIS — I272 Pulmonary hypertension, unspecified: Secondary | ICD-10-CM

## 2013-08-21 DIAGNOSIS — R404 Transient alteration of awareness: Secondary | ICD-10-CM | POA: Diagnosis not present

## 2013-08-21 DIAGNOSIS — I319 Disease of pericardium, unspecified: Secondary | ICD-10-CM | POA: Diagnosis not present

## 2013-08-21 DIAGNOSIS — R55 Syncope and collapse: Secondary | ICD-10-CM

## 2013-08-21 DIAGNOSIS — R0602 Shortness of breath: Secondary | ICD-10-CM

## 2013-08-21 DIAGNOSIS — Z7901 Long term (current) use of anticoagulants: Secondary | ICD-10-CM | POA: Insufficient documentation

## 2013-08-21 DIAGNOSIS — I509 Heart failure, unspecified: Secondary | ICD-10-CM | POA: Diagnosis not present

## 2013-08-21 DIAGNOSIS — E65 Localized adiposity: Secondary | ICD-10-CM | POA: Insufficient documentation

## 2013-08-21 DIAGNOSIS — G4730 Sleep apnea, unspecified: Secondary | ICD-10-CM | POA: Insufficient documentation

## 2013-08-21 DIAGNOSIS — I2609 Other pulmonary embolism with acute cor pulmonale: Secondary | ICD-10-CM | POA: Diagnosis not present

## 2013-08-21 DIAGNOSIS — Z79899 Other long term (current) drug therapy: Secondary | ICD-10-CM | POA: Insufficient documentation

## 2013-08-21 DIAGNOSIS — I5032 Chronic diastolic (congestive) heart failure: Secondary | ICD-10-CM

## 2013-08-21 DIAGNOSIS — R001 Bradycardia, unspecified: Secondary | ICD-10-CM

## 2013-08-21 DIAGNOSIS — R42 Dizziness and giddiness: Secondary | ICD-10-CM | POA: Diagnosis not present

## 2013-08-21 LAB — COMPREHENSIVE METABOLIC PANEL
ALBUMIN: 3.9 g/dL (ref 3.5–5.2)
ALK PHOS: 50 U/L (ref 39–117)
ALT: 15 U/L (ref 0–35)
AST: 35 U/L (ref 0–37)
BUN: 25 mg/dL — ABNORMAL HIGH (ref 6–23)
CO2: 29 mEq/L (ref 19–32)
Calcium: 9.6 mg/dL (ref 8.4–10.5)
Chloride: 100 mEq/L (ref 96–112)
Creatinine, Ser: 1.2 mg/dL — ABNORMAL HIGH (ref 0.50–1.10)
GFR calc non Af Amer: 44 mL/min — ABNORMAL LOW (ref >90)
GFR, EST AFRICAN AMERICAN: 51 mL/min — AB (ref >90)
GLUCOSE: 95 mg/dL (ref 70–99)
POTASSIUM: 3.9 meq/L (ref 3.7–5.3)
SODIUM: 142 meq/L (ref 137–147)
TOTAL PROTEIN: 8.2 g/dL (ref 6.0–8.3)
Total Bilirubin: 0.8 mg/dL (ref 0.3–1.2)

## 2013-08-21 LAB — CBC WITH DIFFERENTIAL/PLATELET
Basophils Absolute: 0 10*3/uL (ref 0.0–0.1)
Basophils Relative: 0 % (ref 0–1)
EOS ABS: 0.1 10*3/uL (ref 0.0–0.7)
Eosinophils Relative: 1 % (ref 0–5)
HCT: 46 % (ref 36.0–46.0)
Hemoglobin: 14.9 g/dL (ref 12.0–15.0)
LYMPHS PCT: 23 % (ref 12–46)
Lymphs Abs: 1.3 10*3/uL (ref 0.7–4.0)
MCH: 31.2 pg (ref 26.0–34.0)
MCHC: 32.4 g/dL (ref 30.0–36.0)
MCV: 96.4 fL (ref 78.0–100.0)
Monocytes Absolute: 0.6 10*3/uL (ref 0.1–1.0)
Monocytes Relative: 11 % (ref 3–12)
NEUTROS PCT: 65 % (ref 43–77)
Neutro Abs: 3.7 10*3/uL (ref 1.7–7.7)
PLATELETS: 91 10*3/uL — AB (ref 150–400)
RBC: 4.77 MIL/uL (ref 3.87–5.11)
RDW: 15.8 % — ABNORMAL HIGH (ref 11.5–15.5)
WBC: 5.7 10*3/uL (ref 4.0–10.5)

## 2013-08-21 LAB — PROTEIN ELECTROPHORESIS, SERUM
ALPHA-1-GLOBULIN: 3.6 % (ref 2.9–4.9)
ALPHA-2-GLOBULIN: 9.1 % (ref 7.1–11.8)
Albumin ELP: 52.6 % — ABNORMAL LOW (ref 55.8–66.1)
Beta 2: 6.5 % (ref 3.2–6.5)
Beta Globulin: 6 % (ref 4.7–7.2)
GAMMA GLOBULIN: 22.2 % — AB (ref 11.1–18.8)
TOTAL PROTEIN, SERUM ELECTROPHOR: 7.3 g/dL (ref 6.0–8.3)

## 2013-08-21 LAB — PROTIME-INR
INR: 1.39 (ref 0.00–1.49)
Prothrombin Time: 16.7 seconds — ABNORMAL HIGH (ref 11.6–15.2)

## 2013-08-21 MED ORDER — TIOTROPIUM BROMIDE MONOHYDRATE 18 MCG IN CAPS
18.0000 ug | ORAL_CAPSULE | Freq: Every day | RESPIRATORY_TRACT | Status: DC
Start: 2013-08-21 — End: 2013-08-23
  Administered 2013-08-22 – 2013-08-23 (×2): 18 ug via RESPIRATORY_TRACT
  Filled 2013-08-21 (×2): qty 5

## 2013-08-21 MED ORDER — SODIUM CHLORIDE 0.9 % IJ SOLN: 3.0000 mL | INTRAMUSCULAR | Status: DC | PRN

## 2013-08-21 MED ORDER — SODIUM CHLORIDE 0.9 % IJ SOLN: 3.0000 mL | Freq: Two times a day (BID) | INTRAMUSCULAR | Status: DC

## 2013-08-21 MED ORDER — NON FORMULARY: 40.0000 mg | Freq: Every day | Status: DC

## 2013-08-21 MED ORDER — ATORVASTATIN CALCIUM 40 MG PO TABS
40.0000 mg | ORAL_TABLET | Freq: Every day | ORAL | Status: DC
  Administered 2013-08-22 (×2): 40 mg via ORAL
  Filled 2013-08-21 (×3): qty 1

## 2013-08-21 MED ORDER — MACITENTAN 10 MG PO TABS
10.0000 mg | ORAL_TABLET | Freq: Every day | ORAL | Status: DC
  Filled 2013-08-21 (×4): qty 1

## 2013-08-21 MED ORDER — HYDROCODONE-ACETAMINOPHEN 5-325 MG PO TABS
1.0000 | ORAL_TABLET | Freq: Four times a day (QID) | ORAL | Status: DC | PRN
  Administered 2013-08-22: 1 via ORAL
  Filled 2013-08-21: qty 2

## 2013-08-21 MED ORDER — SODIUM CHLORIDE 0.9 % IJ SOLN
3.0000 mL | Freq: Two times a day (BID) | INTRAMUSCULAR | Status: DC
  Administered 2013-08-22 – 2013-08-23 (×4): 3 mL via INTRAVENOUS

## 2013-08-21 MED ORDER — TADALAFIL (PAH) 20 MG PO TABS
40.0000 mg | ORAL_TABLET | Freq: Every day | ORAL | Status: DC
  Administered 2013-08-22 – 2013-08-23 (×3): 40 mg via ORAL
  Filled 2013-08-21 (×5): qty 2

## 2013-08-21 MED ORDER — METOPROLOL TARTRATE 50 MG PO TABS
75.0000 mg | ORAL_TABLET | Freq: Two times a day (BID) | ORAL | Status: DC
  Administered 2013-08-22: 75 mg via ORAL
  Filled 2013-08-21 (×3): qty 1

## 2013-08-21 MED ORDER — AMLODIPINE BESYLATE 2.5 MG PO TABS
2.5000 mg | ORAL_TABLET | Freq: Every day | ORAL | Status: DC
  Administered 2013-08-22 – 2013-08-23 (×3): 2.5 mg via ORAL
  Filled 2013-08-21 (×4): qty 1

## 2013-08-21 MED ORDER — NON FORMULARY: 10.0000 mg | Freq: Every day | Status: DC

## 2013-08-21 MED ORDER — SODIUM CHLORIDE 0.9 % IV SOLN: 250.0000 mL | INTRAVENOUS | Status: DC | PRN

## 2013-08-21 NOTE — ED Notes (Signed)
To ED via EMS from [facility] for eval of dizziness when she uses the bathroom. Per pt, this isn't a new problem. No complaint  now

## 2013-08-21 NOTE — Telephone Encounter (Signed)
Patient Demographics     Patient Name Sex DOB SSN Address Phone    [name], Vicki Female 05/07/1941 [id number] [place] [address] [phone] [facility]) [phone] (Mobile)              Message Received: 3 days ago     Larey Dresser, MD Katrine Coho, RN            Webb Silversmith,  Could you make sure that Mrs Sinor has gotten her Opsumit and her Adcirca? Thanks. Also need to make sure she is set up for [place] in 3 wks and for abdominal fat pad biopsy. Needs to stop coumadin 3 days prior to [place] with INR on the day off.  Thanks.        Patient Demographics     Patient Name Sex DOB SSN Address Phone    Vicki, [name] Female 05-29-1941 [id number] [place] [address] [phone] [facility]) [phone] (Mobile)              Message Received: 3 days ago     Larey Dresser, MD Katrine Coho, RN            Webb Silversmith,  Could you make sure that Mrs Floresca has gotten her Opsumit and her Adcirca? Thanks. Also need to make sure she is set up for [place] in 3 wks and for abdominal fat pad biopsy. Needs to stop coumadin 3 days prior to [place] with INR on the day off.  Thanks.        Patient Demographics     Patient Name Sex DOB SSN Address Phone    Vicki, [name] Female [birth year] [id number] [place] [address] [phone] [facility]) [phone] (Mobile)              Message Received: 3 days ago     Larey Dresser, MD Katrine Coho, RN            Webb Silversmith,  Could you make sure that Mrs Choung has gotten her Opsumit and her Adcirca? Thanks. Also need to make sure she is set up for [place] in 3 wks and for abdominal fat pad biopsy. Needs to stop coumadin 3 days prior to [place] with INR on the day off.  Thanks.        Patient Demographics     Patient Name Sex DOB SSN Address Phone    Vicki, [name] Female 07/15/1940 [id number] [place] [address] [phone] (Home) [phone] (Mobile)               Message Received: 3 days ago    NOTE Copied from 08/18/13 [facility] MESSAGE FROM DR [facility] Larey Dresser, MD Katrine Coho, RN            Webb Silversmith,  Could you make sure that Mrs Maves has gotten her Opsumit and her Adcirca? Thanks. Also need to make sure she is set up for [place] in 3 wks and for abdominal fat pad biopsy. Needs to stop coumadin 3 days prior to [place] with INR on the day off.  Thanks.     08/21/13---Fat Pad Biopsy scheduled for 08/23/13                 RHC scheduled for 09/07/13--pt to take last coumadin 09/04/13

## 2013-08-21 NOTE — ED Notes (Signed)
Dr.Knapp at bedside  

## 2013-08-21 NOTE — Telephone Encounter (Signed)
New message     Missed the nurses call--returning her call

## 2013-08-21 NOTE — H&P (Signed)
Advanced Heart Failure Team History and Physical Note   Primary Physician: Primary Cardiologist:  [facility]  Reason for Admission: Presyncope, PAH    HPI:    Vicki [name] is a 73 yo with history of chronic diastolic CHF. Severe PH and chronic atrial fibrillation who is being admitted for recurrent presyncope.   She has been followed buy Dr. Aundra Dubin closely and fount to have PA pressures > 100 on RHC. Last echocardiogram in 2/15 that showed severe LVH, EF 75%, small pericardial effusion, mildly dilated RV with mildly decreased systolic function, PA systolic pressure 84 mmHg. She was taking both macitentan and tadalafil at the time. Since then, she has run out of both medications and is having trouble getting refills. She feels worse off these medications. [place] without her oxygen. She says that she passed out briefly and slumped down. She fractured her foot with the fall.   She has not gotten her meds back yet but expects them soon. Since that time has been very SOB with minimal exertion and has multiple episodes of presyncope particularly after going to the bathroom. Today was at the [place] office getting some labs. She was there almost 90 minutes and ran out of ehr O2. She went to urinate and on getting up from toilet became presyncopal. (no LOC) . She was brought to ER. Now feels better. Denies edema, palpitations or weight gain.   6 minute walk (5/14): 122 m.  6 minute walk (7/14): 152 m  6 minute walk (10/14): 183 m  Labs (12/13): HCT 45.1, plts 153, K 3.5, creatinine 1.0  Labs (1/14): BNP 849  Labs (4/14): K 3.1, creatinine 1.0, ANA and anti-SCL-70 antibody negative. Rheumatoid factor negative. Serum immunofixation did not show monoclonal light chains.  Labs (5/14): K 3.3, creatinine 0.79, proBNP 5147  Labs (6/14): K 4, creatinine 0.9, BNP 1001  Labs (10/14): LDL 53, LDL-P 975, K 3.7, creatinine 1.2  Labs (11/14): K 3.7,  creatinine 0.9, BNP 832   PMH:  1. Chronic diastolic CHF: Echo (7/40) with EF 55-60%, severe LVH (no SAM, no asymmetric hypertrophy, no LVOT gradient), moderate-severe LAE, moderately dilated RV with mildly decreased systolic function, moderate to severe RAE, PA systolic pressure 86 mmHg, moderate-severe TR, moderate MR, trivial pericardial effusion. Cardiac MRI (5/14): EF 65%, severe LVH, no definite evidence for amyloidosis (no delayed enhancement, myocardium not difficult to null). Echo (2/15) with EF 75%, severe LVH, grade II diastolic dysfunction, moderate MR, RV mildly dilated with mildly decreased systolic function, moderate TR, PA systolic pressure 84 mmHg, small pericardial effusion.  2. Chronic atrial fibrillation since around 2004.  3. HTN: For decades.  4. LHC (2/08) with no significant disease.  5. Chronic thrombocytopenia: ITP  6. Pulmonary arterial HTN: RHC (5/14) with mean RA 13, PA 104/36 (mean 63), mean PCWP 20 on right and 23 on left, CI 2.3 (Fick) and 1.6 (thermo), PVR 10.4 WU (Fick) and 15 WU (thermo). Vasodilator testing not done as patient was already on amlodipine. V/Q scan (5/14) with no evidence for chronic PE. ANA, RF, and anti-SCL70 antibody negative. PFTs (5/14) with FEV1 60%, FVC 54%, ratio 112%, TLC 61%, DLCO 43% => restrictive defect. Sleep study (7/14) with no OSA. CT chest with areas of mosaic attenuation in lungs (saw pulmonary, thought air trapping and not ILD).   SH: Married, retired from a bank, 3 children, lives in [place].  FH: No heart disease, +HTN.  Review of Systems: [y] = yes, _0  = no   General: Weight gain _1 ; Weight loss _2 ; Anorexia _3 ; Fatigue [ y]; Fever _4 ; Chills _5 ; Weakness _6   Cardiac: Chest pain/pressure _7 ; Resting SOB _8 ; Exertional SOB Blue.Reese ]; Orthopnea _9 ; Pedal Edema _10 ; Palpitations _11 ; Syncope Blue.Reese ]; Presyncope Blue.Reese ]; Paroxysmal nocturnal dyspnea_12   Pulmonary: Cough [ y]; Wheezing_13 ; Hemoptysis_14 ; Sputum _15 ; Snoring _16    GI: Vomiting_17 ; Dysphagia_18 ; Melena_19 ; Hematochezia _20 ; Heartburn_21 ; Abdominal pain _22 ; Constipation _23 ; Diarrhea _24 ; BRBPR _25   GU: Hematuria_26 ; Dysuria _27 ; Nocturia_28   Vascular: Pain in legs with walking _29 ; Pain in feet with lying flat _30 ; Non-healing sores _31 ; Stroke _32 ; TIA _33 ; Slurred speech _34 ;  Neuro: Headaches_35 ; Vertigo_36 ; Seizures_37 ; Paresthesias_38 ;Blurred vision _39 ; Diplopia _40 ; Vision changes _41   Ortho/Skin: Arthritis _42 ; Joint pain Blue.Reese ]; Muscle pain _43 ; Joint swelling _44 ; Back Pain _45 ; Rash _46   Psych: Depression_47 ; Anxiety_48   Heme: Bleeding problems _49 ; Clotting disorders _50 ; Anemia _51   Endocrine: Diabetes _52 ; Thyroid dysfunction_53   Home Medications Prior to Admission medications   Medication Sig Start Date End Date Taking? Authorizing Provider  amLODipine (NORVASC) 2.5 MG tablet Take 2.5 mg by mouth daily.   Yes Historical Provider, MD  HYDROcodone-acetaminophen (NORCO/VICODIN) 5-325 MG per tablet 1 tablet every 6 (six) hours as needed for moderate pain.  08/04/13  Yes Historical Provider, MD  Macitentan (OPSUMIT) 10 MG TABS Take 1 tablet by mouth daily with breakfast. 08/10/13  Yes Peter M Martinique, MD  metoprolol (LOPRESSOR) 50 MG tablet Take 1.5 tablets (75 mg total) by mouth 2 (two) times daily. 05/05/13  Yes Larey Dresser, MD  potassium chloride SA (K-DUR,KLOR-CON) 20 MEQ tablet 2 tablets (total 40 mEq) two times a day 05/01/13  Yes Larey Dresser, MD  rosuvastatin (CRESTOR) 20 MG tablet Take 20 mg by mouth daily.   Yes Historical Provider, MD  tiotropium (SPIRIVA) 18 MCG inhalation capsule Place 1 capsule (18 mcg total) into inhaler and inhale daily. 07/04/13  Yes Collene Gobble, MD  torsemide (DEMADEX) 20 MG tablet 1 and 1/2 tablets (total 31m) two times a day 05/01/13  Yes DLarey Dresser MD  Tadalafil, PAH, 20 MG TABS Take 40 mg by mouth daily. 05/01/13   DLarey Dresser MD  warfarin (COUMADIN) 5 MG tablet Take 2.5-5 mg by mouth daily.  Takes 1/2 tablet on Monday, Wednesday and Friday and 1 tablet all other days    Historical Provider, MD    Past Medical History: Past Medical History  Diagnosis Date  . Atrial fibrillation   . CHF (congestive heart failure)   . Sleep apnea   . Pulmonary hypertension     Past Surgical History: Past Surgical History  Procedure Laterality Date  . Vaginal hysterectomy    . Breast lumpectomy      benign  . Doppler echocardiography  Aug 09 2012    severe LVH  with mild cavity obliteration. EF 55-60% without normal relaxationbut difficult to really tell total diastolic function due to a fib; sclerotic aortic valve with no stenosis. mod mitral regurg. mod to severely dilated left atrium; mod dilated rgt right ventricle w/elevated pressures, estimated peak PA presures _54  TR jet calculation. RGT  ATRIUM MOD to SEVERE,    . Nm myocar perf wall motion  2004    EXERCISE  ,no ischemia  . Cardiac catheterization  08/12/2006    no significant disease  . Cardiac catheterization  10/28/2012    right heart cath done    Family History: Family History  Problem Relation Age of Onset  . Heart Problems Brother   . Hypertension Brother   . Alzheimer's disease Mother   . Heart attack Father   . Thyroid disease Maternal Aunt   . Pulmonary Hypertension Cousin   . Heart Problems Maternal Aunt   . Thyroid disease Daughter   . Thyroid disease Daughter     Social History: History   Social History  . Marital Status: Widowed    Spouse Name: N/A    Number of Children: N/A  . Years of Education: N/A   Occupational History  . retired    Social History Main Topics  . Smoking status: Never Smoker   . Smokeless tobacco: None  . Alcohol Use: No  . Drug Use: No  . Sexual Activity: None   Other Topics Concern  . None   Social History Narrative  . None    Allergies:  No Known Allergies  Objective:    Vital Signs:   Temp:  [97.4 F (36.3 C)] 97.4 F (36.3 C) (02/23 1755) Resp:  [16]  16 (02/23 1755) BP: (155)/(80) 155/80 mmHg (02/23 1755) SpO2:  [96 %] 96 % (02/23 1755)   There were no vitals filed for this visit.  Physical Exam: General:  Elderly No resp difficulty HEENT: normal Neck: supple. JVP 9 . Carotids 2+ bilat; no bruits. No lymphadenopathy or thryomegaly appreciated. Cor: PMI nondisplaced. Iregular rate & rhythm. 2/6 TR. ? Faint RV lift Lungs: clear Abdomen: soft, nontender, nondistended. No hepatosplenomegaly. No bruits or masses. Good bowel sounds. Extremities: no cyanosis, clubbing, rash, edema Neuro: alert & orientedx3, cranial nerves grossly intact. moves all 4 extremities w/o difficulty. Affect pleasant  Telemetry: AF  Labs: Basic Metabolic Panel:  Recent Labs Lab 08/21/13 1824  NA 142  K 3.9  CL 100  CO2 29  GLUCOSE 95  BUN 25*  CREATININE 1.20*  CALCIUM 9.6    Liver Function Tests:  Recent Labs Lab 08/21/13 1824  AST 35  ALT 15  ALKPHOS 50  BILITOT 0.8  PROT 8.2  ALBUMIN 3.9   No results found for this basename: LIPASE, AMYLASE,  in the last 168 hours No results found for this basename: AMMONIA,  in the last 168 hours  CBC:  Recent Labs Lab 08/21/13 1824  WBC 5.7  NEUTROABS 3.7  HGB 14.9  HCT 46.0  MCV 96.4  PLT 91*    Cardiac Enzymes: No results found for this basename: CKTOTAL, CKMB, CKMBINDEX, TROPONINI,  in the last 168 hours  BNP: BNP (last 3 results)  Recent Labs  12/01/12 1608 01/26/13 1625 05/15/13 1546  PROBNP 1001.0* 959.0* 832.0*    CBG: No results found for this basename: GLUCAP,  in the last 168 hours  Coagulation Studies:  Recent Labs  08/21/13 1824  LABPROT 16.7*  INR 1.39    Other results: EKG: AF 55 with LVH and RVH. Nonspecific ST -T abnormalities  Imaging:  No results found.      Assessment:   1. Recurrent presyncope 2. Severe PAH with cor pulmonale 3. Chronic AF  Plan/Discussion:    Suspect presyncope due to worsening PAH and cor pulmonale in the  setting of  not having her PAH meds. Will bring in for observation. Hold diuretics. May need to consider repeat RHC and consideration of IV therapies though may defer this until she gets back on her po meds. Will d/w Dr. Aundra Dubin. Watch on tele. Continue coumadin for AF (per pharmacy).   Daniel Bensimhon,MD 7:28 PM  Length of Stay: 0  Advanced Heart Failure Team Pager [phone] (M-F; 7a - 4p)  Please contact [facility] for night-coverage after hours (4p -7a ) and weekends on [email or web address]

## 2013-08-21 NOTE — Telephone Encounter (Signed)
Dr Aundra Dubin has received notification from [facility] that Cialis 75m (adcirca/tadalafil)  tablet 60/30 is available to member at contracted copayment and no authorization is required. . Pt has been advised and states she gets both Opsumit and Adcirca from [facility] and will be in touch with [facility] today.

## 2013-08-21 NOTE — ED Provider Notes (Signed)
CSN: [id number]     Arrival date & time 08/21/13  1737 History   First MD Initiated Contact with Patient 08/21/13 1738     Chief Complaint  Patient presents with  . Dizziness    HPI Patient was at her doctor's office getting a urine sample. Pt started to feel lightheaded.  Pt felt like she was going to pass out.  Patient does have a significant history of pulmonary hypertension and often has trouble with feeling lightheaded. She is supposed to be on medications for pulmonary hypertension and actually ran out of that medication couple of weeks ago. The medication and has been refilled but she is waiting for it to come from the special pharmacy. While the patient was giving her urine sample her oxygen also ran out. The office called 911 and the patient was transported to the emergency department for evaluation. She was placed on oxygen and now she states she feels back to her usual state of health. Any chest pain or shortness of breath. She denies any focal numbness or weakness. She has no trouble with her speech or coordination Past Medical History  Diagnosis Date  . Atrial fibrillation   . CHF (congestive heart failure)   . Sleep apnea   . Pulmonary hypertension    Past Surgical History  Procedure Laterality Date  . Vaginal hysterectomy    . Breast lumpectomy      benign  . Doppler echocardiography  Aug 09 2012    severe LVH  with mild cavity obliteration. EF 55-60% without normal relaxationbut difficult to really tell total diastolic function due to a fib; sclerotic aortic valve with no stenosis. mod mitral regurg. mod to severely dilated left atrium; mod dilated rgt right ventricle w/elevated pressures, estimated peak PA presures _0  TR jet calculation. RGT ATRIUM MOD to SEVERE,    . Nm myocar perf wall motion  2004    EXERCISE  ,no ischemia  . Cardiac catheterization  08/12/2006    no significant disease  . Cardiac catheterization  10/28/2012    right heart cath done   Family  History  Problem Relation Age of Onset  . Heart Problems Brother   . Hypertension Brother   . Alzheimer's disease Mother   . Heart attack Father   . Thyroid disease Maternal Aunt   . Pulmonary Hypertension Cousin   . Heart Problems Maternal Aunt   . Thyroid disease Daughter   . Thyroid disease Daughter    History  Substance Use Topics  . Smoking status: Never Smoker   . Smokeless tobacco: Not on file  . Alcohol Use: No   OB History   Grav Para Term Preterm Abortions TAB SAB Ect Mult Living                 Review of Systems  All other systems reviewed and are negative.      Allergies  Review of patient's allergies indicates no known allergies.  Home Medications   Current Outpatient Rx  Name  Route  Sig  Dispense  Refill  . amLODipine (NORVASC) 2.5 MG tablet   Oral   Take 2.5 mg by mouth daily.         Marland Kitchen HYDROcodone-acetaminophen (NORCO/VICODIN) 5-325 MG per tablet      1 tablet every 6 (six) hours as needed for moderate pain.          . Macitentan (OPSUMIT) 10 MG TABS   Oral   Take 1 tablet by mouth daily with  breakfast.   30 tablet   3     Must be a 30 day supply per [facility] will only pay o ...   . metoprolol (LOPRESSOR) 50 MG tablet   Oral   Take 1.5 tablets (75 mg total) by mouth 2 (two) times daily.   90 tablet   3   . potassium chloride SA (K-DUR,KLOR-CON) 20 MEQ tablet      2 tablets (total 40 mEq) two times a day   120 tablet   4   . rosuvastatin (CRESTOR) 20 MG tablet   Oral   Take 20 mg by mouth daily.         Marland Kitchen tiotropium (SPIRIVA) 18 MCG inhalation capsule   Inhalation   Place 1 capsule (18 mcg total) into inhaler and inhale daily.   10 capsule   0   . torsemide (DEMADEX) 20 MG tablet      1 and 1/2 tablets (total 92m) two times a day   90 tablet   4   . Tadalafil, PAH, 20 MG TABS   Oral   Take 40 mg by mouth daily.         .Marland Kitchenwarfarin (COUMADIN) 5 MG tablet   Oral   Take 2.5-5 mg by mouth daily. Takes 1/2 tablet  on Monday, Wednesday and Friday and 1 tablet all other days          BP 155/80  Temp(Src) 97.4 F (36.3 C) (Oral)  Resp 16  SpO2 96% Physical Exam  Nursing note and vitals reviewed. Constitutional: She appears well-developed and well-nourished. No distress.  HENT:  Head: Normocephalic and atraumatic.  Right Ear: External ear normal.  Left Ear: External ear normal.  Eyes: Conjunctivae are normal. Right eye exhibits no discharge. Left eye exhibits no discharge. No scleral icterus.  Neck: Neck supple. No tracheal deviation present.  Cardiovascular: Normal rate and intact distal pulses.  An irregularly irregular rhythm present.  Murmur heard. Pulmonary/Chest: Effort normal and breath sounds normal. No stridor. No respiratory distress. She has no wheezes. She has no rales.  Abdominal: Soft. Bowel sounds are normal. She exhibits no distension. There is no tenderness. There is no rebound and no guarding.  Musculoskeletal: She exhibits no edema and no tenderness.  Neurological: She is alert. She has normal strength. No cranial nerve deficit (no facial droop, extraocular movements intact, no slurred speech) or sensory deficit. She exhibits normal muscle tone. She displays no seizure activity. Coordination normal.  Skin: Skin is warm and dry. No rash noted.  Psychiatric: She has a normal mood and affect.    ED Course  Procedures (including critical care time) Labs Review Labs Reviewed  CBC WITH DIFFERENTIAL - Abnormal; Notable for the following:    RDW 15.8 (*)    Platelets 91 (*)    All other components within normal limits  COMPREHENSIVE METABOLIC PANEL - Abnormal; Notable for the following:    BUN 25 (*)    Creatinine, Ser 1.20 (*)    GFR calc non Af Amer 44 (*)    GFR calc Af Amer 51 (*)    All other components within normal limits  PROTIME-INR - Abnormal; Notable for the following:    Prothrombin Time 16.7 (*)    All other components within normal limits   Imaging Review Dg  Chest 2 View  08/21/2013   CLINICAL DATA:  Dizziness, congestive heart failure  EXAM: CHEST  2 VIEW  COMPARISON:  02/01/2013  FINDINGS: Cardiomegaly is noted. Central  vascular congestion without convincing pulmonary edema. No segmental infiltrate. Mild degenerative changes mid thoracic spine.  IMPRESSION: Cardiomegaly. Central vascular congestion without convincing pulmonary edema.   Electronically Signed   By: Lahoma Crocker M.D.   On: 08/21/2013 19:33    EKG Interpretation    Date/Time:  Monday August 21 2013 17:54:48 EST Ventricular Rate:  55 PR Interval:    QRS Duration: 97 QT Interval:  517 QTC Calculation: 494 R Axis:   49 Text Interpretation:  Atrial fibrillation LVH with secondary repolarization abnormality Borderline prolonged QT interval No significant change since last tracing Confirmed by Hollyn Stucky  MD-J, Shakaria Raphael ([id number]) on 08/21/2013 6:09:39 PM            MDM   Final diagnoses:  Pulmonary hypertension  Near syncope    Patient's symptoms are most likely related to her pulmonary hypertension. The patient's symptoms improved after being placed back on oxygen. However she continues to have difficulty with any minimal exertion. Unfortunately, they have not been able to get her started back on her medications for her pulmonary hypertension. They are still waiting for the medication to arrive from the pharmacy.  Dr. Haroldine Laws evaluated the patient emergency department. He plans on admitting the patient for observation    Kathalene Frames, MD 08/22/13 1540

## 2013-08-22 DIAGNOSIS — I5032 Chronic diastolic (congestive) heart failure: Secondary | ICD-10-CM

## 2013-08-22 DIAGNOSIS — I2789 Other specified pulmonary heart diseases: Secondary | ICD-10-CM

## 2013-08-22 DIAGNOSIS — I498 Other specified cardiac arrhythmias: Secondary | ICD-10-CM

## 2013-08-22 DIAGNOSIS — I4891 Unspecified atrial fibrillation: Secondary | ICD-10-CM | POA: Diagnosis not present

## 2013-08-22 DIAGNOSIS — I509 Heart failure, unspecified: Secondary | ICD-10-CM

## 2013-08-22 DIAGNOSIS — R55 Syncope and collapse: Secondary | ICD-10-CM | POA: Diagnosis not present

## 2013-08-22 LAB — BASIC METABOLIC PANEL
BUN: 23 mg/dL (ref 6–23)
BUN: 24 mg/dL — ABNORMAL HIGH (ref 6–23)
CHLORIDE: 102 meq/L (ref 96–112)
CO2: 32 mEq/L (ref 19–32)
CO2: 31 mEq/L (ref 19–32)
CREATININE: 1.3 mg/dL — AB (ref 0.4–1.2)
Calcium: 9.3 mg/dL (ref 8.4–10.5)
Calcium: 9.6 mg/dL (ref 8.4–10.5)
Chloride: 102 mEq/L (ref 96–112)
Creatinine, Ser: 1.17 mg/dL — ABNORMAL HIGH (ref 0.50–1.10)
GFR calc Af Amer: 53 mL/min — ABNORMAL LOW (ref >90)
GFR calc non Af Amer: 45 mL/min — ABNORMAL LOW (ref >90)
GFR: 53.62 mL/min — AB (ref >60.00)
GLUCOSE: 101 mg/dL — AB (ref 70–99)
GLUCOSE: 123 mg/dL — AB (ref 70–99)
POTASSIUM: 3.9 meq/L (ref 3.5–5.1)
POTASSIUM: 3.8 meq/L (ref 3.7–5.3)
Sodium: 139 mEq/L (ref 135–145)
Sodium: 142 mEq/L (ref 137–147)

## 2013-08-22 LAB — CBC WITH DIFFERENTIAL/PLATELET
BASOS ABS: 0 10*3/uL (ref 0.0–0.1)
Basophils Relative: 0.3 % (ref 0.0–3.0)
Eosinophils Absolute: 0.1 10*3/uL (ref 0.0–0.7)
Eosinophils Relative: 1.1 % (ref 0.0–5.0)
HEMATOCRIT: 41.1 % (ref 36.0–46.0)
Hemoglobin: 13.5 g/dL (ref 12.0–15.0)
LYMPHS ABS: 1.4 10*3/uL (ref 0.7–4.0)
Lymphocytes Relative: 23.1 % (ref 12.0–46.0)
MCHC: 32.8 g/dL (ref 30.0–36.0)
MCV: 95.4 fl (ref 78.0–100.0)
MONO ABS: 0.6 10*3/uL (ref 0.1–1.0)
MONOS PCT: 10.3 % (ref 3.0–12.0)
Neutro Abs: 3.8 10*3/uL (ref 1.4–7.7)
Neutrophils Relative %: 65.2 % (ref 43.0–77.0)
RBC: 4.31 Mil/uL (ref 3.87–5.11)
RDW: 16.6 % — AB (ref 11.5–14.6)
WBC: 5.9 10*3/uL (ref 4.5–10.5)

## 2013-08-22 LAB — PROTIME-INR
INR: 1.9 ratio — ABNORMAL HIGH (ref 0.8–1.0)
Prothrombin Time: 19.6 s — ABNORMAL HIGH (ref 10.2–12.4)

## 2013-08-22 MED ORDER — POTASSIUM CHLORIDE CRYS ER 20 MEQ PO TBCR
40.0000 meq | EXTENDED_RELEASE_TABLET | Freq: Every day | ORAL | Status: DC
  Administered 2013-08-22 – 2013-08-23 (×2): 40 meq via ORAL
  Filled 2013-08-22 (×2): qty 2

## 2013-08-22 MED ORDER — METOPROLOL TARTRATE 25 MG PO TABS
25.0000 mg | ORAL_TABLET | Freq: Two times a day (BID) | ORAL | Status: DC
  Filled 2013-08-22 (×2): qty 1

## 2013-08-22 MED ORDER — TORSEMIDE 20 MG PO TABS
30.0000 mg | ORAL_TABLET | Freq: Two times a day (BID) | ORAL | Status: DC
  Administered 2013-08-22 – 2013-08-23 (×3): 30 mg via ORAL
  Filled 2013-08-22 (×5): qty 1

## 2013-08-22 MED ORDER — METOPROLOL TARTRATE 50 MG PO TABS
50.0000 mg | ORAL_TABLET | Freq: Two times a day (BID) | ORAL | Status: DC
  Filled 2013-08-22 (×2): qty 1

## 2013-08-22 NOTE — Progress Notes (Signed)
Pt. With HR down to 39 this am. Pt. Asymptomatic, awake and resting in bed quietly. Pt. Denies pain or discomfort. No s/s of distress noted. On call MD, Aundra Dubin notified. RN will continue to monitor pt. For changes in condition. Elanna Bert, Katherine Roan

## 2013-08-22 NOTE — Progress Notes (Signed)
Patient ID: Vicki [name], female   DOB: 16-Aug-1940, 73 y.o.   MRN: [id number]   SUBJECTIVE: Patient feels better this morning on oxygen.  HR in 30s-40s last night, atrial fibrillation.  Currently about 50.  As noted, she has been out of her pulmonary hypertension meds x 2 wks.   PMH:  1. Chronic diastolic CHF: Echo (0/26) with EF 55-60%, severe LVH (no SAM, no asymmetric hypertrophy, no LVOT gradient), moderate-severe LAE, moderately dilated RV with mildly decreased systolic function, moderate to severe RAE, PA systolic pressure 86 mmHg, moderate-severe TR, moderate MR, trivial pericardial effusion. Cardiac MRI (5/14): EF 65%, severe LVH, no definite evidence for amyloidosis (no delayed enhancement, myocardium not difficult to null). Echo (2/15) with EF 75%, severe LVH, grade II diastolic dysfunction, moderate MR, RV mildly dilated with mildly decreased systolic function, moderate TR, PA systolic pressure 84 mmHg, small pericardial effusion.  2. Chronic atrial fibrillation since around 2004.  3. HTN: For decades.  4. LHC (2/08) with no significant disease.  5. Chronic thrombocytopenia: ITP  6. Pulmonary arterial HTN: RHC (5/14) with mean RA 13, PA 104/36 (mean 63), mean PCWP 20 on right and 23 on left, CI 2.3 (Fick) and 1.6 (thermo), PVR 10.4 WU (Fick) and 15 WU (thermo). Vasodilator testing not done as patient was already on amlodipine. V/Q scan (5/14) with no evidence for chronic PE. ANA, RF, and anti-SCL70 antibody negative. PFTs (5/14) with FEV1 60%, FVC 54%, ratio 112%, TLC 61%, DLCO 43% => restrictive defect. Sleep study (7/14) with no OSA. CT chest with areas of mosaic attenuation in lungs (saw pulmonary, thought air trapping and not ILD).   Scheduled Meds: . amLODipine  2.5 mg Oral Daily  . atorvastatin  40 mg Oral q1800  . Macitentan  10 mg Oral Daily  . potassium chloride  40 mEq Oral Daily  . sodium chloride  3 mL Intravenous Q12H  . sodium chloride  3 mL Intravenous Q12H  . Tadalafil  (PAH)  40 mg Oral Daily  . tiotropium  18 mcg Inhalation Daily  . torsemide  30 mg Oral BID   Continuous Infusions:  PRN Meds:.sodium chloride, HYDROcodone-acetaminophen, sodium chloride    Filed Vitals:   08/21/13 2115 08/22/13 0011 08/22/13 0321 08/22/13 0657  BP: 135/75 101/57 139/72   Pulse: 76 67 52   Temp: 97.6 F (36.4 C)     TempSrc: Oral     Resp:      Height: 5' 2.5" (1.588 m)     Weight:    73.619 kg (162 lb 4.8 oz)  SpO2: 95%       Intake/Output Summary (Last 24 hours) at 08/22/13 0805 Last data filed at 08/22/13 0045  Gross per 24 hour  Intake    240 ml  Output      0 ml  Net    240 ml    LABS: Basic Metabolic Panel:  Recent Labs  08/21/13 1824 08/22/13 0435  NA 142 142  K 3.9 3.8  CL 100 102  CO2 29 31  GLUCOSE 95 123*  BUN 25* 24*  CREATININE 1.20* 1.17*  CALCIUM 9.6 9.6   Liver Function Tests:  Recent Labs  08/21/13 1824  AST 35  ALT 15  ALKPHOS 50  BILITOT 0.8  PROT 8.2  ALBUMIN 3.9   No results found for this basename: LIPASE, AMYLASE,  in the last 72 hours CBC:  Recent Labs  08/21/13 1824  WBC 5.7  NEUTROABS 3.7  HGB 14.9  HCT 46.0  MCV 96.4  PLT 91*   Cardiac Enzymes: No results found for this basename: CKTOTAL, CKMB, CKMBINDEX, TROPONINI,  in the last 72 hours BNP: No components found with this basename: POCBNP,  D-Dimer: No results found for this basename: DDIMER,  in the last 72 hours Hemoglobin A1C: No results found for this basename: HGBA1C,  in the last 72 hours Fasting Lipid Panel: No results found for this basename: CHOL, HDL, LDLCALC, TRIG, CHOLHDL, LDLDIRECT,  in the last 72 hours Thyroid Function Tests: No results found for this basename: TSH, T4TOTAL, FREET3, T3FREE, THYROIDAB,  in the last 72 hours Anemia Panel: No results found for this basename: VITAMINB12, FOLATE, FERRITIN, TIBC, IRON, RETICCTPCT,  in the last 72 hours  RADIOLOGY: Dg Chest 2 View  08/21/2013   CLINICAL DATA:  Dizziness,  congestive heart failure  EXAM: CHEST  2 VIEW  COMPARISON:  02/01/2013  FINDINGS: Cardiomegaly is noted. Central vascular congestion without convincing pulmonary edema. No segmental infiltrate. Mild degenerative changes mid thoracic spine.  IMPRESSION: Cardiomegaly. Central vascular congestion without convincing pulmonary edema.   Electronically Signed   By: Lahoma Crocker M.D.   On: 08/21/2013 19:33    PHYSICAL EXAM General: NAD Neck: JVP 8 cm, no thyromegaly or thyroid nodule.  Lungs: Slight dry crackles at bases bilaterally  CV: Nondisplaced PMI. Heart irregular S1/S2, 2/6 HSM LLSB. Trace ankle edema  No carotid bruit. Normal pedal pulses.  Abdomen: Soft, nontender, no hepatosplenomegaly, no distention.  Neurologic: Alert and oriented x 3.  Psych: Normal affect.  Extremities: No clubbing or cyanosis  TELEMETRY: Reviewed telemetry pt in atrial fibrillation, HR as low as upper 30s, now 40s-50s  ASSESSMENT AND PLAN: 73 yo with severe pulmonary HTN, chronic atrial fibrillation, and diastolic CHF was admitted with presyncope while using the bathroom yesterday.  She has had multiple episodes of presyncope with micturation recently.  She is clearly symptomatically worse now that she has been out of her pulmonary hypertension medications x 2 wks.  1. Presyncope: I am concerned about her HR, which has been in the upper 30s-50s here.  This may be playing a role => any further vagal tone with micturation could certainly cause profound bradycardia and BP drop.  Additionally, she has been off her pulmonary hypertension meds (and ran out of her oxygen prior to yesterday's event) with suspected higher pulmonary artery pressures making a fall in systemic pressure with presyncope more likely as well. - For now, will stop metoprolol and follow HR response.  - She is now back on her oxygen and will restart macitentan and Adcirca. 2. Chronic diastolic CHF: She is not markedly volume overloaded.  Will keep her on  torsemide 30 mg bid.  There has been concern for amyloidosis and she is scheduled for abdominal fat pad biopsy tomorrow.  I am going to keep her in hospital to follow HR today and will work on getting her to the biopsy in the morning, probably discharge afterwards.  She has been off warfarin for biopsy.  3. Chronic atrial fibrillation: See discussion above about HR.  Will stop metoprolol and follow.  Off warfarin for biopsy, restart afterwards.   4. Pulmonary arterial HTN: Patient has severe pulmonary arterial HTN out of proportion to mildly elevated PWCP on last right heart cath. PVR was 10.4 WU and CI was 2.3 by Fick (1.6 by thermodilution). It is possible that long-standing PCWP elevation from diastolic LV dysfunction could lead to pulmonary vascular changes with pulmonary arterial hypertension out  of proportion to the PCWP elevation. However, I cannot rule out possible idiopathic primary pulmonary HTN. Collagen vascular disease workup was negative (negative RF, ANA and negative anti-SCL-70). V/Q scan was not suggestive of chronic PEs. PFTs were suggestive of restrictive lung disease but CT chest and evaluation by pulmonary did not suggest interstitial lung disease. Sleep study did not show OSA. I am concerned that she has not shown a good response to dual therapy with macitentan and tadalafil. Echo in 0/71 showed PA systolic pressure 86 mmHg while on macitentan and tadalafil. She is clearly worse symptomatically, however, off macitentan and tadalafil.  My plan would be to get her back on her meds (will have care coordinator work on this today) and do [place] on meds.  May need IV prostanoid eventually.   [name] 08/22/2013 8:17 AM

## 2013-08-22 NOTE — Progress Notes (Signed)
Pt. Arrived to floor in alert and stable condition. No s/s of distress or discomfort noted. Pt. Denies pain. VSS. Family at bedside. Pt. Oriented to room. Call light placed within reach. RN will continue to monitor pt. For changes in condition. Vicki [name], Katherine Roan

## 2013-08-23 ENCOUNTER — Encounter (HOSPITAL_COMMUNITY): Payer: Self-pay | Admitting: Radiology

## 2013-08-23 ENCOUNTER — Observation Stay (HOSPITAL_COMMUNITY): Payer: Medicare Other

## 2013-08-23 ENCOUNTER — Ambulatory Visit (HOSPITAL_COMMUNITY)
Admission: RE | Admit: 2013-08-23 | Discharge: 2013-08-23 | Disposition: A | Discharge status: Home / Self Care | Payer: Medicare Other | Source: Ambulatory Visit | Attending: Cardiology | Admitting: Cardiology

## 2013-08-23 DIAGNOSIS — D214 Benign neoplasm of connective and other soft tissue of abdomen: Secondary | ICD-10-CM | POA: Diagnosis not present

## 2013-08-23 DIAGNOSIS — R55 Syncope and collapse: Secondary | ICD-10-CM | POA: Diagnosis not present

## 2013-08-23 DIAGNOSIS — I2789 Other specified pulmonary heart diseases: Secondary | ICD-10-CM | POA: Diagnosis not present

## 2013-08-23 DIAGNOSIS — I498 Other specified cardiac arrhythmias: Secondary | ICD-10-CM | POA: Diagnosis not present

## 2013-08-23 DIAGNOSIS — I4891 Unspecified atrial fibrillation: Secondary | ICD-10-CM | POA: Diagnosis not present

## 2013-08-23 DIAGNOSIS — R1900 Intra-abdominal and pelvic swelling, mass and lump, unspecified site: Secondary | ICD-10-CM | POA: Diagnosis not present

## 2013-08-23 LAB — CBC
HEMATOCRIT: 42.6 % (ref 36.0–46.0)
HEMOGLOBIN: 13.9 g/dL (ref 12.0–15.0)
MCH: 31.3 pg (ref 26.0–34.0)
MCHC: 32.6 g/dL (ref 30.0–36.0)
MCV: 95.9 fL (ref 78.0–100.0)
Platelets: 96 10*3/uL — ABNORMAL LOW (ref 150–400)
RBC: 4.44 MIL/uL (ref 3.87–5.11)
RDW: 15.9 % — AB (ref 11.5–15.5)
WBC: 5.1 10*3/uL (ref 4.0–10.5)

## 2013-08-23 LAB — PROTEIN ELECTROPHORESIS, URINE REFLEX
Albumin: 60.8 %
Alpha-1-Globulin, U: 18 %
Alpha-2-Globulin, U: 8.2 %
BETA GLOBULIN, U: 8.1 %
GAMMA GLOBULIN, U: 4.9 %
Total Protein, Urine: 40 mg/dL

## 2013-08-23 LAB — BASIC METABOLIC PANEL
BUN: 24 mg/dL — ABNORMAL HIGH (ref 6–23)
CHLORIDE: 101 meq/L (ref 96–112)
CO2: 30 mEq/L (ref 19–32)
CREATININE: 1.1 mg/dL (ref 0.50–1.10)
Calcium: 9.1 mg/dL (ref 8.4–10.5)
GFR calc Af Amer: 57 mL/min — ABNORMAL LOW (ref >90)
GFR calc non Af Amer: 49 mL/min — ABNORMAL LOW (ref >90)
GLUCOSE: 104 mg/dL — AB (ref 70–99)
POTASSIUM: 3.5 meq/L — AB (ref 3.7–5.3)
Sodium: 142 mEq/L (ref 137–147)

## 2013-08-23 LAB — PROTIME-INR
INR: 1.26 (ref 0.00–1.49)
Prothrombin Time: 15.5 seconds — ABNORMAL HIGH (ref 11.6–15.2)

## 2013-08-23 MED ORDER — FENTANYL CITRATE 0.05 MG/ML IJ SOLN
INTRAMUSCULAR | Status: AC
  Filled 2013-08-23: qty 2

## 2013-08-23 MED ORDER — MIDAZOLAM HCL 2 MG/2ML IJ SOLN
INTRAMUSCULAR | Status: AC
  Filled 2013-08-23: qty 2

## 2013-08-23 MED ORDER — POTASSIUM CHLORIDE CRYS ER 20 MEQ PO TBCR: 40.0000 meq | EXTENDED_RELEASE_TABLET | Freq: Once | ORAL | Status: DC

## 2013-08-23 NOTE — Progress Notes (Signed)
I stopped in to see Ms.  Muckle.  She has had some previous HF teaching however we did review the "Living with HF" book including signs and symptoms of HF, when to call the physician, importance of daily weights, low sodium diet and fluid restriction.  She acknowledges understanding of all the above topics.  There were some question about ability to get her [facility] meds--however delivery of those to her home has been arranged for tomorrow.  She has a follow-up appointment scheduled for 08/30/2013 -0940 am.  Carole Binning RN, BS, PCCN--Heart Failure Nurse Navigator

## 2013-08-23 NOTE — Progress Notes (Signed)
Patient ID: Vicki [name], female   DOB: 04-12-1941, 73 y.o.   MRN: [id number]    SUBJECTIVE: Patient feels better this morning.  Adcirca restarted, have not been able to get macitentan in hospital.  HR in 60s now off metoprolol.  No lightheadedness.  As noted, she has been out of her pulmonary hypertension meds x 2 wks.   PMH:  1. Chronic diastolic CHF: Echo (2/53) with EF 55-60%, severe LVH (no SAM, no asymmetric hypertrophy, no LVOT gradient), moderate-severe LAE, moderately dilated RV with mildly decreased systolic function, moderate to severe RAE, PA systolic pressure 86 mmHg, moderate-severe TR, moderate MR, trivial pericardial effusion. Cardiac MRI (5/14): EF 65%, severe LVH, no definite evidence for amyloidosis (no delayed enhancement, myocardium not difficult to null). Echo (2/15) with EF 75%, severe LVH, grade II diastolic dysfunction, moderate MR, RV mildly dilated with mildly decreased systolic function, moderate TR, PA systolic pressure 84 mmHg, small pericardial effusion.  2. Chronic atrial fibrillation since around 2004.  3. HTN: For decades.  4. LHC (2/08) with no significant disease.  5. Chronic thrombocytopenia: ITP  6. Pulmonary arterial HTN: RHC (5/14) with mean RA 13, PA 104/36 (mean 63), mean PCWP 20 on right and 23 on left, CI 2.3 (Fick) and 1.6 (thermo), PVR 10.4 WU (Fick) and 15 WU (thermo). Vasodilator testing not done as patient was already on amlodipine. V/Q scan (5/14) with no evidence for chronic PE. ANA, RF, and anti-SCL70 antibody negative. PFTs (5/14) with FEV1 60%, FVC 54%, ratio 112%, TLC 61%, DLCO 43% => restrictive defect. Sleep study (7/14) with no OSA. CT chest with areas of mosaic attenuation in lungs (saw pulmonary, thought air trapping and not ILD).   Scheduled Meds: . amLODipine  2.5 mg Oral Daily  . atorvastatin  40 mg Oral q1800  . fentaNYL      . midazolam      . potassium chloride  40 mEq Oral Daily  . sodium chloride  3 mL Intravenous Q12H  . sodium  chloride  3 mL Intravenous Q12H  . Tadalafil (PAH)  40 mg Oral Daily  . tiotropium  18 mcg Inhalation Daily  . torsemide  30 mg Oral BID   Continuous Infusions:  PRN Meds:.sodium chloride, HYDROcodone-acetaminophen, sodium chloride    Filed Vitals:   08/22/13 2058 08/23/13 0222 08/23/13 0641 08/23/13 0839  BP: 100/55 128/76 131/79 140/78  Pulse: 74 59 53 64  Temp: 97.4 F (36.3 C) 97.4 F (36.3 C) 97.6 F (36.4 C)   TempSrc: Oral Oral Oral   Resp: _0 Height:      Weight:   161 lb 6.4 oz (73.211 kg)   SpO2: 96% 98% 98%     Intake/Output Summary (Last 24 hours) at 08/23/13 0857 Last data filed at 08/23/13 0845  Gross per 24 hour  Intake    480 ml  Output   2400 ml  Net  -1920 ml    LABS: Basic Metabolic Panel:  Recent Labs  08/22/13 0435 08/23/13 0338  NA 142 142  K 3.8 3.5*  CL 102 101  CO2 31 30  GLUCOSE 123* 104*  BUN 24* 24*  CREATININE 1.17* 1.10  CALCIUM 9.6 9.1   Liver Function Tests:  Recent Labs  08/21/13 1824  AST 35  ALT 15  ALKPHOS 50  BILITOT 0.8  PROT 8.2  ALBUMIN 3.9   No results found for this basename: LIPASE, AMYLASE,  in the last 72 hours CBC:  Recent Labs  08/21/13 1639 08/21/13 1824 08/23/13 0338  WBC 5.9 5.7 5.1  NEUTROABS 3.8 3.7  --   HGB 13.5 14.9 13.9  HCT 41.1 46.0 42.6  MCV 95.4 96.4 95.9  PLT 64.0 Result may be falsely decreased due to platelet clumping.* 91* 96*   Cardiac Enzymes: No results found for this basename: CKTOTAL, CKMB, CKMBINDEX, TROPONINI,  in the last 72 hours BNP: No components found with this basename: POCBNP,  D-Dimer: No results found for this basename: DDIMER,  in the last 72 hours Hemoglobin A1C: No results found for this basename: HGBA1C,  in the last 72 hours Fasting Lipid Panel: No results found for this basename: CHOL, HDL, LDLCALC, TRIG, CHOLHDL, LDLDIRECT,  in the last 72 hours Thyroid Function Tests: No results found for this basename: TSH, T4TOTAL, FREET3, T3FREE,  THYROIDAB,  in the last 72 hours Anemia Panel: No results found for this basename: VITAMINB12, FOLATE, FERRITIN, TIBC, IRON, RETICCTPCT,  in the last 72 hours  RADIOLOGY: Dg Chest 2 View  08/21/2013   CLINICAL DATA:  Dizziness, congestive heart failure  EXAM: CHEST  2 VIEW  COMPARISON:  02/01/2013  FINDINGS: Cardiomegaly is noted. Central vascular congestion without convincing pulmonary edema. No segmental infiltrate. Mild degenerative changes mid thoracic spine.  IMPRESSION: Cardiomegaly. Central vascular congestion without convincing pulmonary edema.   Electronically Signed   By: Lahoma Crocker M.D.   On: 08/21/2013 19:33    PHYSICAL EXAM General: NAD Neck: JVP 7 cm, no thyromegaly or thyroid nodule.  Lungs: Slight dry crackles at bases bilaterally  CV: Nondisplaced PMI. Heart irregular S1/S2, 2/6 HSM LLSB. Trace ankle edema  No carotid bruit. Normal pedal pulses.  Abdomen: Soft, nontender, no hepatosplenomegaly, no distention.  Neurologic: Alert and oriented x 3.  Psych: Normal affect.  Extremities: No clubbing or cyanosis  TELEMETRY: atrial fibrillation, HR in 60s  ASSESSMENT AND PLAN: 73 yo with severe pulmonary HTN, chronic atrial fibrillation, and diastolic CHF was admitted with presyncope while using the bathroom yesterday.  She has had multiple episodes of presyncope with micturation recently.  She is clearly symptomatically worse now that she has been out of her pulmonary hypertension medications x 2 wks.  1. Presyncope: I am concerned about her HR, which was initially in the upper 30s-50s here on metoprolol.  This may be playing a role => any further vagal tone with micturation could certainly cause profound bradycardia and BP drop.  Additionally, she has been off her pulmonary hypertension meds (and ran out of her oxygen prior to [facility] event) with suspected higher pulmonary artery pressures making a fall in systemic pressure with presyncope more likely as well. - I am going to  leave her off metoprolol and will arrange holter monitor x 48 hrs to follow HR off metoprolol for a little longer.   - She is now back on her oxygen and will restart macitentan and Adcirca => Adcirca restarted, macitentan will be delivered to her house tomorrow. 2. Chronic diastolic CHF: She is not markedly volume overloaded.  Will keep her on torsemide 30 mg bid.  There has been concern for amyloidosis and she is scheduled for abdominal fat pad biopsy today.   3. Chronic atrial fibrillation: See discussion above about HR.  Will stop metoprolol and follow.  Off warfarin for biopsy, restart afterwards.   4. Pulmonary arterial HTN: Patient has severe pulmonary arterial HTN out of proportion to mildly elevated PWCP on last right heart cath. PVR was 10.4 WU and CI was 2.3 by Fick (  1.6 by thermodilution). It is possible that long-standing PCWP elevation from diastolic LV dysfunction could lead to pulmonary vascular changes with pulmonary arterial hypertension out of proportion to the PCWP elevation. However, I cannot rule out possible idiopathic primary pulmonary HTN. Collagen vascular disease workup was negative (negative RF, ANA and negative anti-SCL-70). V/Q scan was not suggestive of chronic PEs. PFTs were suggestive of restrictive lung disease but CT chest and evaluation by pulmonary did not suggest interstitial lung disease. Sleep study did not show OSA. I am concerned that she has not shown a good response to dual therapy with macitentan and tadalafil. Echo in 5/42 showed PA systolic pressure 86 mmHg while on macitentan and tadalafil. She is clearly worse symptomatically, however, off macitentan and tadalafil.  My plan would be to get her back on her meds (will have care coordinator work on this today) and do [place] on meds in about 2 wks.  May need IV prostanoid eventually if pressures remain high on meds.  5. Disposition: May go home today after biopsy.  She will stay off metoprolol for now.  She needs to be  set up for 48 hour holter monitor.  She will restart warfarin.  She will restart her same home meds except will stay off metoprolol.  Her pulmonary hypertension meds should be delivered to her house tomorrow.  Make sure she gets Adcirca and if possible macitentan today.  She needs to followup with me in a week or so, can be seen in CHF clinic.  Will need to have [place] after 10-14 days back on her pulmonary hypertension meds.  Needs INR followup coumadin clinic.   [name] 08/23/2013 9:14 AM   [name] 08/23/2013 8:57 AM

## 2013-08-23 NOTE — Discharge Instructions (Signed)
Heart Failure Heart failure means your heart has trouble pumping blood. This makes it hard for your body to work well. Heart failure is usually a long-term (chronic) condition. You must take good care of yourself and follow your doctor's treatment plan. HOME CARE  Take your heart medicine as told by your doctor.  Do not stop taking medicine unless your doctor tells you to.  Do not skip any dose of medicine.  Refill your medicines before they run out.  Take other medicines only as told by your doctor or pharmacist.  Stay active if told by your doctor. The elderly and people with severe heart failure should talk with a doctor about physical activity.  Eat heart healthy foods. Choose foods that are without trans fat and are low in saturated fat, cholesterol, and salt (sodium). This includes fresh or frozen fruits and vegetables, fish, lean meats, fat-free or low-fat dairy foods, whole grains, and high-fiber foods. Lentils and dried peas and beans (legumes) are also good choices.  Limit salt if told by your doctor.  Cook in a healthy way. Roast, grill, broil, bake, poach, steam, or stir-fry foods.  Limit fluids as told by your doctor.  Weigh yourself every morning. Do this after you pee (urinate) and before you eat breakfast. Write down your weight to give to your doctor.  Take your blood pressure and write it down if your doctor tell you to.  Ask your doctor how to check your pulse. Check your pulse as told.  Lose weight if told by your doctor.  Stop smoking or chewing tobacco. Do not use gum or patches that help you quit without your doctor's approval.  Schedule and go to doctor visits as told.  Nonpregnant women should have no more than 1 drink a day. Men should have no more than 2 drinks a day. Talk to your doctor about drinking alcohol.  Stop illegal drug use.  Stay current with shots (immunizations).  Manage your health conditions as told by your doctor.  Learn to manage  your stress.  Rest when you are tired.  If it is really hot outside:  Avoid intense activities.  Use air conditioning or fans, or get in a cooler place.  Avoid caffeine and alcohol.  Wear loose-fitting, lightweight, and light-colored clothing.  If it is really cold outside:  Avoid intense activities.  Layer your clothing.  Wear mittens or gloves, a hat, and a scarf when going outside.  Avoid alcohol.  Learn about heart failure and get support as needed.  Get help to maintain or improve your quality of life and your ability to care for yourself as needed. GET HELP IF:   You gain 03 lb/1.4 kg or more in 1 day or 05 lb/2.3 kg in a week.  You are more short of breath than usual.  You cannot do your normal activities.  You tire easily.  You cough more than normal, especially with activity.  You have any or more puffiness (swelling) in areas such as your hands, feet, ankles, or belly (abdomen).  You cannot sleep because it is hard to breathe.  You feel like your heart is beating fast (palpitations).  You get dizzy or lightheaded when you stand up. GET HELP RIGHT AWAY IF:   You have trouble breathing.  There is a change in mental status, such as becoming less alert or not being able to focus.  You have chest pain or discomfort.  You faint. MAKE SURE YOU:   Understand these   instructions.  Will watch your condition.  Will get help right away if you are not doing well or get worse. Document Released: 03/24/2008 Document Revised: 10/10/2012 Document Reviewed: 01/14/2012 [facility] Patient Information 2014 [place], [place].

## 2013-08-23 NOTE — Progress Notes (Signed)
D/C Tele, D/C I.V., pt. D/C information reviewed with pt., pt. Belongings sent with daughter, pt. And daughter verbalized understanding of D/C instructions and follow-up appt., D/C paperwork given to pt., pt. Left unit via wheelchair and transported home via her daughter. Pt. Showed no signs or symptoms of distress.

## 2013-08-23 NOTE — Consult Note (Signed)
HPI: Vicki [name] is an 73 y.o. female with CHF and afib who was dmitted for pre-syncopal episode a few days ago.  She is improved and stable at this point. She was also in process of workup for amyloidosis and was scheduled for US guided abdominal fat pad to be done as an outpt later today. Since she is stable and otherwise ready for discharge, IR is requested to do this biopsy today before discharge. Chart, PMHx, meds and labs reviewed.  Past Medical History:  Past Medical History  Diagnosis Date  . Atrial fibrillation   . CHF (congestive heart failure)   . Sleep apnea   . Pulmonary hypertension     Past Surgical History:  Past Surgical History  Procedure Laterality Date  . Vaginal hysterectomy    . Breast lumpectomy      benign  . Doppler echocardiography  Aug 09 2012    severe LVH  with mild cavity obliteration. EF 55-60% without normal relaxationbut difficult to really tell total diastolic function due to a fib; sclerotic aortic valve with no stenosis. mod mitral regurg. mod to severely dilated left atrium; mod dilated rgt right ventricle w/elevated pressures, estimated peak PA presures _0  TR jet calculation. RGT ATRIUM MOD to SEVERE,    . Nm myocar perf wall motion  2004    EXERCISE  ,no ischemia  . Cardiac catheterization  08/12/2006    no significant disease  . Cardiac catheterization  10/28/2012    right heart cath done    Family History:  Family History  Problem Relation Age of Onset  . Heart Problems Brother   . Hypertension Brother   . Alzheimer's disease Mother   . Heart attack Father   . Thyroid disease Maternal Aunt   . Pulmonary Hypertension Cousin   . Heart Problems Maternal Aunt   . Thyroid disease Daughter   . Thyroid disease Daughter     Social History:  reports that she has never smoked. She does not have any smokeless tobacco history on file. She reports that she does not drink alcohol or use illicit drugs.  Allergies: No Known  Allergies  Medications:   Medication List    ASK your doctor about these medications       amLODipine 2.5 MG tablet  Commonly known as:  NORVASC  Take 2.5 mg by mouth daily.     HYDROcodone-acetaminophen 5-325 MG per tablet  Commonly known as:  NORCO/VICODIN  1 tablet every 6 (six) hours as needed for moderate pain.     Macitentan 10 MG Tabs  Commonly known as:  OPSUMIT  Take 1 tablet by mouth daily with breakfast.     metoprolol 50 MG tablet  Commonly known as:  LOPRESSOR  Take 1.5 tablets (75 mg total) by mouth 2 (two) times daily.     potassium chloride SA 20 MEQ tablet  Commonly known as:  K-DUR,KLOR-CON  2 tablets (total 40 mEq) two times a day     rosuvastatin 20 MG tablet  Commonly known as:  CRESTOR  Take 20 mg by mouth at bedtime.     Tadalafil (PAH) 20 MG Tabs  Take 40 mg by mouth daily.     tiotropium 18 MCG inhalation capsule  Commonly known as:  SPIRIVA  Place 1 capsule (18 mcg total) into inhaler and inhale daily.     torsemide 20 MG tablet  Commonly known as:  DEMADEX  1 and 1/2 tablets (total 65m) two times a day  warfarin 5 MG tablet  Commonly known as:  COUMADIN  Take 2.5-5 mg by mouth daily. Takes 1/2 tablet on Monday, Wednesday and Friday and 1 tablet all other days        Please HPI for pertinent positives, otherwise complete 10 system ROS negative.  Physical Exam: BP 131/79  Pulse 53  Temp(Src) 97.6 F (36.4 C) (Oral)  Resp 18  Ht 5' 2.5" (1.588 m)  Wt 161 lb 6.4 oz (73.211 kg)  BMI 29.03 kg/m2  SpO2 98% Body mass index is 29.03 kg/(m^2).   General Appearance:  Alert, cooperative, no distress, appears stated age  Head:  Normocephalic, without obvious abnormality, atraumatic  ENT: Unremarkable airway  Neck: Supple, symmetrical, trachea midline  Lungs:   Clear to auscultation bilaterally, no w/r/r  Chest Wall:  No tenderness or deformity  Heart:  Irreg irreg  Abdomen:   Soft, non-tender, non distended. Ample adipose tissue   Neurologic: Normal affect, no gross deficits.   Results for orders placed during the hospital encounter of 08/21/13 (from the past 48 hour(s))  CBC WITH DIFFERENTIAL     Status: Abnormal   Collection Time    08/21/13  6:24 PM      Result Value Ref Range   WBC 5.7  4.0 - 10.5 K/uL   RBC 4.77  3.87 - 5.11 MIL/uL   Hemoglobin 14.9  12.0 - 15.0 g/dL   HCT 46.0  36.0 - 46.0 %   MCV 96.4  78.0 - 100.0 fL   MCH 31.2  26.0 - 34.0 pg   MCHC 32.4  30.0 - 36.0 g/dL   RDW 15.8 (*) 11.5 - 15.5 %   Platelets 91 (*) 150 - 400 K/uL   Comment: PLATELET COUNT CONFIRMED BY SMEAR     REPEATED TO VERIFY     SPECIMEN CHECKED FOR CLOTS   Neutrophils Relative % 65  43 - 77 %   Neutro Abs 3.7  1.7 - 7.7 K/uL   Lymphocytes Relative 23  12 - 46 %   Lymphs Abs 1.3  0.7 - 4.0 K/uL   Monocytes Relative 11  3 - 12 %   Monocytes Absolute 0.6  0.1 - 1.0 K/uL   Eosinophils Relative 1  0 - 5 %   Eosinophils Absolute 0.1  0.0 - 0.7 K/uL   Basophils Relative 0  0 - 1 %   Basophils Absolute 0.0  0.0 - 0.1 K/uL  COMPREHENSIVE METABOLIC PANEL     Status: Abnormal   Collection Time    08/21/13  6:24 PM      Result Value Ref Range   Sodium 142  137 - 147 mEq/L   Potassium 3.9  3.7 - 5.3 mEq/L   Chloride 100  96 - 112 mEq/L   CO2 29  19 - 32 mEq/L   Glucose, Bld 95  70 - 99 mg/dL   BUN 25 (*) 6 - 23 mg/dL   Creatinine, Ser 1.20 (*) 0.50 - 1.10 mg/dL   Calcium 9.6  8.4 - 10.5 mg/dL   Total Protein 8.2  6.0 - 8.3 g/dL   Albumin 3.9  3.5 - 5.2 g/dL   AST 35  0 - 37 U/L   ALT 15  0 - 35 U/L   Alkaline Phosphatase 50  39 - 117 U/L   Total Bilirubin 0.8  0.3 - 1.2 mg/dL   GFR calc non Af Amer 44 (*) >90 mL/min   GFR calc Af Amer 51 (*) >90 mL/min  Comment: (NOTE)     The eGFR has been calculated using the CKD EPI equation.     This calculation has not been validated in all clinical situations.     eGFR's persistently <90 mL/min signify possible Chronic Kidney     Disease.  PROTIME-INR     Status: Abnormal    Collection Time    08/21/13  6:24 PM      Result Value Ref Range   Prothrombin Time 16.7 (*) 11.6 - 15.2 seconds   INR 1.39  0.00 - 7.12  BASIC METABOLIC PANEL     Status: Abnormal   Collection Time    08/22/13  4:35 AM      Result Value Ref Range   Sodium 142  137 - 147 mEq/L   Potassium 3.8  3.7 - 5.3 mEq/L   Chloride 102  96 - 112 mEq/L   CO2 31  19 - 32 mEq/L   Glucose, Bld 123 (*) 70 - 99 mg/dL   BUN 24 (*) 6 - 23 mg/dL   Creatinine, Ser 1.17 (*) 0.50 - 1.10 mg/dL   Calcium 9.6  8.4 - 10.5 mg/dL   GFR calc non Af Amer 45 (*) >90 mL/min   GFR calc Af Amer 53 (*) >90 mL/min   Comment: (NOTE)     The eGFR has been calculated using the CKD EPI equation.     This calculation has not been validated in all clinical situations.     eGFR's persistently <90 mL/min signify possible Chronic Kidney     Disease.  BASIC METABOLIC PANEL     Status: Abnormal   Collection Time    08/23/13  3:38 AM      Result Value Ref Range   Sodium 142  137 - 147 mEq/L   Potassium 3.5 (*) 3.7 - 5.3 mEq/L   Chloride 101  96 - 112 mEq/L   CO2 30  19 - 32 mEq/L   Glucose, Bld 104 (*) 70 - 99 mg/dL   BUN 24 (*) 6 - 23 mg/dL   Creatinine, Ser 1.10  0.50 - 1.10 mg/dL   Calcium 9.1  8.4 - 10.5 mg/dL   GFR calc non Af Amer 49 (*) >90 mL/min   GFR calc Af Amer 57 (*) >90 mL/min   Comment: (NOTE)     The eGFR has been calculated using the CKD EPI equation.     This calculation has not been validated in all clinical situations.     eGFR's persistently <90 mL/min signify possible Chronic Kidney     Disease.  CBC     Status: Abnormal   Collection Time    08/23/13  3:38 AM      Result Value Ref Range   WBC 5.1  4.0 - 10.5 K/uL   RBC 4.44  3.87 - 5.11 MIL/uL   Hemoglobin 13.9  12.0 - 15.0 g/dL   HCT 42.6  36.0 - 46.0 %   MCV 95.9  78.0 - 100.0 fL   MCH 31.3  26.0 - 34.0 pg   MCHC 32.6  30.0 - 36.0 g/dL   RDW 15.9 (*) 11.5 - 15.5 %   Platelets 96 (*) 150 - 400 K/uL   Comment: REPEATED TO VERIFY      PLATELET COUNT CONFIRMED BY SMEAR  PROTIME-INR     Status: Abnormal   Collection Time    08/23/13  3:38 AM      Result Value Ref Range   Prothrombin Time 15.5 (*)  11.6 - 15.2 seconds   INR 1.26  0.00 - 1.49   Dg Chest 2 View  08/21/2013   CLINICAL DATA:  Dizziness, congestive heart failure  EXAM: CHEST  2 VIEW  COMPARISON:  02/01/2013  FINDINGS: Cardiomegaly is noted. Central vascular congestion without convincing pulmonary edema. No segmental infiltrate. Mild degenerative changes mid thoracic spine.  IMPRESSION: Cardiomegaly. Central vascular congestion without convincing pulmonary edema.   Electronically Signed   By: Lahoma Crocker M.D.   On: 08/21/2013 19:33    Assessment/Plan CHF, chronic afib Possible amyloidosis. For US guided abd fat pad biopsy. Discussed procedure, risks, complications with pt. Labs reviewed, INR 1.2 as Coumadin has been held Consent signed in chart  Ascencion Dike PA-C 08/23/2013, 8:24 AM

## 2013-08-23 NOTE — Procedures (Signed)
Interventional Radiology Procedure Note  Procedure: US guided core biopsy abdominal fat pad Complications: none Recommendations: - Bedrest x 1 hr then DC home - path pending  Signed,  Criselda Peaches, MD [facility]

## 2013-08-23 NOTE — Discharge Summary (Signed)
Advanced Heart Failure Team  Discharge Summary   Patient ID: Vicki [name] MRN: [id number], DOB/AGE: 02-03-1941 73 y.o. Admit date: 08/21/2013 D/C date:     08/23/2013   Primary Discharge Diagnoses:  1. Recurrent presyncope  2. Severe PAH with cor pulmonale on adcirca and macitentan 3. Chronic AF- on chronic coumadin  4. 08/23/13 S/P Fat Pad Biopsy    Hospital Course:  Vicki [name] is a 73 yo with history of chronic diastolic CHF. Severe PH and chronic atrial fibrillation who is being admitted for recurrent presyncope.   She has been followed buy Dr. Aundra Dubin closely and fount to have PA pressures > 100 on RHC. Last echocardiogram in 07/2013 that showed severe LVH, EF 75%, small pericardial effusion, mildly dilated RV with mildly decreased systolic function, PA systolic pressure 84 mmHg. She was taking both macitentan and tadalafil at the time. Since then, she has run out of both medications and is having trouble getting refills. She feels worse off these medications. [place] without her oxygen. She says that she passed out briefly and slumped down.   She was brought to [facility] ED after he ran out of her oxygen at [facility] and became presyncopal getting up from the toilet. She was admitted with suspected presyncope due to worsening PAH and cor pulmonale in the setting of not having her PAH meds for 2 weeks. It was also noted that she was bradycardic during these events.  She was seen in the ER by Dr. Haroldine Laws and admitted for observation. Her diuretics were held and she was followed on telemetry. Adcirca was restarted and symptomatically she improved. While in house she had several episodes of sinus bradycardia with HR 30-50s. Her metoprolol was stopped. Her heart rate improved off metoprolol but a 48 hour Holter  Monitor will be placed September 01, 2013 at [facility].  From an HF perspective her volume status remained stable. She will continue on her home  diuretic regimen which is torsemide 30 mg twice a day.   While in house she also underwent a fat pad biopsy for possible amyloidosis. She restarted coumadin after her procedure. She will have INR checked 09/01/13 at 1300.   A RHC is scheduled 09/07/13 to re-evaluate hemodynamics after she is back on adcirca and macitentan for 2 weeks. She may need IV prostanoid if her pressures remain hight on po meds. Macitentan and adcirca will be delivered to her home tomorrow.   On the day of discharge, she was doing well without complaints. She will follow up in the HF clinic on  March 4th.   Discharge Weight Range: Discharge Weight 161 pounds  Discharge Vitals: Blood pressure 148/83, pulse 66, temperature 97.6 F (36.4 C), temperature source Oral, resp. rate 18, height 5' 2.5" (1.588 m), weight 161 lb 6.4 oz (73.211 kg), SpO2 94.00%.  Labs: Lab Results  Component Value Date   WBC 5.1 08/23/2013   HGB 13.9 08/23/2013   HCT 42.6 08/23/2013   MCV 95.9 08/23/2013   PLT 96* 08/23/2013    Recent Labs Lab 08/21/13 1824  08/23/13 0338  NA 142  < > 142  K 3.9  < > 3.5*  CL 100  < > 101  CO2 29  < > 30  BUN 25*  < > 24*  CREATININE 1.20*  < > 1.10  CALCIUM 9.6  < > 9.1  PROT 8.2  --   --   BILITOT 0.8  --   --  ALKPHOS 50  --   --   ALT 15  --   --   AST 35  --   --   GLUCOSE 95  < > 104*  < > = values in this interval not displayed. No results found for this basename: CHOL, HDL, LDLCALC, TRIG   BNP (last 3 results)  Recent Labs  12/01/12 1608 01/26/13 1625 05/15/13 1546  PROBNP 1001.0* 959.0* 832.0*    Diagnostic Studies/Procedures   Dg Chest 2 View  08/21/2013   CLINICAL DATA:  Dizziness, congestive heart failure  EXAM: CHEST  2 VIEW  COMPARISON:  02/01/2013  FINDINGS: Cardiomegaly is noted. Central vascular congestion without convincing pulmonary edema. No segmental infiltrate. Mild degenerative changes mid thoracic spine.  IMPRESSION: Cardiomegaly. Central vascular congestion without  convincing pulmonary edema.   Electronically Signed   By: Lahoma Crocker M.D.   On: 08/21/2013 19:33   US Biopsy  08/23/2013   CLINICAL DATA:  73 year old female with a possible history of amyloidosis. Ultrasound-guided random biopsy of the abdominal fat pad is warranted to evaluate for amyloid deposits.  EXAM: ULTRASOUND GUIDED core needle BIOPSY OF abdominal fat pad  MEDICATIONS: None required  PROCEDURE: The procedure, risks, benefits, and alternatives were explained to the patient. Questions regarding the procedure were encouraged and answered. The patient understands and consents to the procedure.  The periumbilical region was prepped with Betadine in a sterile fashion, and a sterile drape was applied covering the operative field. Sterile gloves were used for the procedure. Local anesthesia was provided with 1% Lidocaine.  The of periumbilical adipose tissue was interrogated with ultrasound. No large vessels were identified in the biopsy zone. Local anesthesia was attained by infiltration with 1% lidocaine. A very small dermatotomy was made. Under real-time sonographic guidance, multiple 16 gauge core biopsies were then obtained using the Bard automated biopsy device. Biopsies were placed in saline and delivered to pathology for further analysis. There was no significant bleeding or evidence hematoma. The patient tolerated the procedure well.  COMPLICATIONS: None.  FINDINGS: As above  IMPRESSION: Technically successful ultrasound-guided core biopsy of anterior abdominal fat pad.  Signed,  Criselda Peaches, MD  [facility]  [facility]   Electronically Signed   By: Jacqulynn Cadet M.D.   On: 08/23/2013 11:09    Discharge Medications     Medication List    STOP taking these medications       metoprolol 50 MG tablet  Commonly known as:  LOPRESSOR      TAKE these medications       amLODipine 2.5 MG tablet  Commonly known as:  NORVASC  Take 2.5 mg by  mouth daily.     HYDROcodone-acetaminophen 5-325 MG per tablet  Commonly known as:  NORCO/VICODIN  1 tablet every 6 (six) hours as needed for moderate pain.     Macitentan 10 MG Tabs  Commonly known as:  OPSUMIT  Take 1 tablet by mouth daily with breakfast.     potassium chloride SA 20 MEQ tablet  Commonly known as:  K-DUR,KLOR-CON  2 tablets (total 40 mEq) two times a day     rosuvastatin 20 MG tablet  Commonly known as:  CRESTOR  Take 20 mg by mouth at bedtime.     Tadalafil (PAH) 20 MG Tabs  Take 40 mg by mouth daily.     tiotropium 18 MCG inhalation capsule  Commonly known as:  SPIRIVA  Place 1 capsule (18 mcg total) into  inhaler and inhale daily.     torsemide 20 MG tablet  Commonly known as:  DEMADEX  1 and 1/2 tablets (total 29m) two times a day     warfarin 5 MG tablet  Commonly known as:  COUMADIN  Take 2.5-5 mg by mouth daily. Takes 1/2 tablet on Monday, Wednesday and Friday and 1 tablet all other days        Disposition   The patient will be discharged in stable condition to home.     Discharge Orders   Future Appointments Provider Department Dept Phone   08/30/2013 9:40 AM [facility] [phone]  09/01/2013 12:30 PM [phone]  09/01/2013 1:00 PM [facility]in [facility] [phone]  09/05/2013 9:10 AM [facility] [place] [phone]  09/14/2013 4:45 PM [facility] Dresser [phone]  Future Orders Complete By Expires   Contraindication to ACEI at discharge  As directed    Comments:     Diastolic Heart Failure   Diet - low sodium heart healthy  As directed    Heart Failure patients record your daily weight using the same scale at the same time of day  As directed    Increase activity slowly  As directed      Follow-up Information   Follow up  with DLoralie Champagne MD On 08/30/2013. (at 9:40 [place]    Specialty:  Cardiology   Contact information:   [address]  [phone]      Follow up with [facility] [place]. (Will also place Holter Monitor at 12:30 Then you will been seen in the coumadin clinic)    Contact information:   [address] [id number]        Duration of Discharge Encounter: Greater than 35 minutes   Signed, Akera Snowberger NP-C   08/23/2013, 12:52 PM

## 2013-08-24 ENCOUNTER — Encounter (HOSPITAL_COMMUNITY): Payer: Self-pay | Admitting: Pharmacy Technician

## 2013-08-25 DIAGNOSIS — S8263XA Displaced fracture of lateral malleolus of unspecified fibula, initial encounter for closed fracture: Secondary | ICD-10-CM | POA: Diagnosis not present

## 2013-08-29 ENCOUNTER — Other Ambulatory Visit: Payer: Self-pay | Admitting: *Deleted

## 2013-08-29 ENCOUNTER — Other Ambulatory Visit: Payer: Self-pay | Admitting: Cardiology

## 2013-08-29 DIAGNOSIS — R001 Bradycardia, unspecified: Secondary | ICD-10-CM

## 2013-08-29 NOTE — Progress Notes (Signed)
Patient recently in the hospital

## 2013-08-30 ENCOUNTER — Ambulatory Visit (HOSPITAL_COMMUNITY)
Admission: RE | Admit: 2013-08-30 | Discharge: 2013-08-30 | Disposition: A | Discharge status: Home / Self Care | Payer: Medicare Other | Attending: Cardiology | Admitting: Cardiology

## 2013-08-30 VITALS — BP 132/62 | HR 90 | Wt 171.2 lb

## 2013-08-30 DIAGNOSIS — I2789 Other specified pulmonary heart diseases: Secondary | ICD-10-CM | POA: Diagnosis not present

## 2013-08-30 DIAGNOSIS — I498 Other specified cardiac arrhythmias: Secondary | ICD-10-CM | POA: Insufficient documentation

## 2013-08-30 DIAGNOSIS — R001 Bradycardia, unspecified: Secondary | ICD-10-CM | POA: Insufficient documentation

## 2013-08-30 DIAGNOSIS — R0989 Other specified symptoms and signs involving the circulatory and respiratory systems: Secondary | ICD-10-CM | POA: Diagnosis not present

## 2013-08-30 DIAGNOSIS — I4891 Unspecified atrial fibrillation: Secondary | ICD-10-CM

## 2013-08-30 DIAGNOSIS — R0609 Other forms of dyspnea: Secondary | ICD-10-CM

## 2013-08-30 DIAGNOSIS — R0600 Dyspnea, unspecified: Secondary | ICD-10-CM

## 2013-08-30 DIAGNOSIS — R55 Syncope and collapse: Secondary | ICD-10-CM

## 2013-08-30 DIAGNOSIS — I5032 Chronic diastolic (congestive) heart failure: Secondary | ICD-10-CM | POA: Diagnosis not present

## 2013-08-30 DIAGNOSIS — I272 Pulmonary hypertension, unspecified: Secondary | ICD-10-CM

## 2013-08-30 DIAGNOSIS — I509 Heart failure, unspecified: Secondary | ICD-10-CM

## 2013-08-30 LAB — BASIC METABOLIC PANEL
BUN: 15 mg/dL (ref 6–23)
CHLORIDE: 102 meq/L (ref 96–112)
CO2: 29 meq/L (ref 19–32)
Calcium: 9.4 mg/dL (ref 8.4–10.5)
Creatinine, Ser: 0.91 mg/dL (ref 0.50–1.10)
GFR calc Af Amer: 71 mL/min — ABNORMAL LOW (ref >90)
GFR calc non Af Amer: 62 mL/min — ABNORMAL LOW (ref >90)
Glucose, Bld: 101 mg/dL — ABNORMAL HIGH (ref 70–99)
Potassium: 3.6 mEq/L — ABNORMAL LOW (ref 3.7–5.3)
Sodium: 144 mEq/L (ref 137–147)

## 2013-08-30 NOTE — Patient Instructions (Signed)
Labs today  Stop Coumadin after dose on 09/04/13  Your physician recommends that you schedule a follow-up appointment in: 3 weeks

## 2013-08-30 NOTE — Progress Notes (Signed)
Patient ID: Vicki [name], female   DOB: 03/16/1941, 73 y.o.   MRN: [id number] PCP: Dr. Kohut  73 yo with history of chronic diastolic CHF and chronic atrial fibrillation presents for followup of pulmonary hypertension.  Patient was followed by Dr. Tysinger in the past for chronic atrial fibrillation.  She has been on coumadin.  She reports progressive exertional dyspnea since 2011.  This gradually worsened and became quite significant over the last few months.  She used to have significant HTN, but more recently her BP has been on the lower side.  Echo was done in 2/14, showing severe concentric LVH with EF 55-60%, moderately dilated RV, moderate to severe TR, and PA systolic pressure 86 mmHg.  I did a right heart cath in 5/14.  This showed PA pressure 104/36 with PCWP 20, suggesting pulmonary arterial HTN well out of proportion to the mildly elevate wedge pressure.  She was already on amlodipine so I did not do vasodilator testing.  V/Q scan was done, showing no evidence for chronic PEs.  PFTs showed a restrictive defect. Cardiac MRI did not show definite evidence for amyloid.  I started her on macitentan 10 mg daily.  Initially, she felt better on macitentan.  However, she was admitted in 5/14 from her sleep study due to orthopnea and dyspnea.  She was diuresed for several days and diuretic was switched over to torsemide.  I next started her on tadalafil 20 mg daily and titrated up to 40 mg daily.  She thinks that this helped.  She saw pulmonary after chest CT (showed mosaic attenuation in lungs).  This was thought to be due to air-trapping rather than ILD.  She was started on Spiriva. She wears oxygen at home.   She had an echocardiogram in 2/15 that showed severe LVH, EF 75%, small pericardial effusion, mildly dilated RV with mildly decreased systolic function, PA systolic pressure 84 mmHg.  She is not totally sure if she was taking macitentan and tadalafil at that time.  She has had a hard month.  She ran  out of her macitentan and tadalafil and her insurance company refused to refill them.  After this, she took a decided turn for the worse.  She [place] and [place] her foot in the fall.  She became much more short of breath, just with walking around the house. She developed lightheaded spells, especially with micturation.  Given lightheadedness and presyncope, she was admitted last week.  She was restarted on her medications and she finally got back on her meds at home.  In the hospital, she was noted to be bradycardic with HR to 30s so metoprolol was stopped.  She has been checking her pulse at home and it has been in the 60s at rest.  No further lightheadedness/presyncope now that she is back on her meds.  Breathing has been much better.  She can walk on flat ground without problems. No chest pain.  Of note, her abdominal fat pad biopsy did not suggest amyloidosis.   6 minute walk (5/14): 122 m.   6 minute walk (7/14): 152 m 6 minute walk (10/14): 183 m  Labs (12/13): HCT 45.1, plts 153, K 3.5, creatinine 1.0 Labs (1/14): BNP 849 Labs (4/14): K 3.1, creatinine 1.0, ANA and anti-SCL-70 antibody negative.  Rheumatoid factor negative.  Serum immunofixation did not show monoclonal light chains.  Labs (5/14): K 3.3, creatinine 0.79, proBNP 5147 Labs (6/14): K 4, creatinine   0.9, BNP 1001 Labs (10/14): LDL 53, LDL-P 975, K 3.7, creatinine 1.2 Labs (11/14): K 3.7, creatinine 0.9, BNP 832 Labs (2/15): K 3.5, creatinine 1.10, HCT 42.6, UPEP negative, SPEP negative.   PMH: 1. Chronic diastolic CHF: Echo (2/14) with EF 55-60%, severe LVH (no SAM, no asymmetric hypertrophy, no LVOT gradient), moderate-severe LAE, moderately dilated RV with mildly decreased systolic function, moderate to severe RAE, PA systolic pressure 86 mmHg, moderate-severe TR, moderate MR, trivial pericardial effusion.  Cardiac MRI (5/14): EF 65%, severe LVH, no definite evidence for  amyloidosis (no delayed enhancement, myocardium not difficult to null).  Echo (2/15) with EF 75%, severe LVH, grade II diastolic dysfunction, moderate MR, RV mildly dilated with mildly decreased systolic function, moderate TR, PA systolic pressure 84 mmHg, small pericardial effusion. Abdominal fat pad biopsy (2/15) showed no evidence for amyloidosis.  SPEP/UPEP negative 2/15.  2. Chronic atrial fibrillation since around 2004.  Developed bradycardia and metoprolol stopped 2/15.  3. HTN: For decades.  4. LHC (2/08) with no significant disease.  5. Chronic thrombocytopenia: ITP 6. Pulmonary arterial HTN: RHC (5/14) with mean RA 13, PA 104/36 (mean 63), mean PCWP 20 on right and 23 on left, CI 2.3 (Fick) and 1.6 (thermo), PVR 10.4 WU (Fick) and 15 WU (thermo).  Vasodilator testing not done as patient was already on amlodipine.  V/Q scan (5/14) with no evidence for chronic PE.  ANA, RF, and anti-SCL70 antibody negative.  PFTs (5/14) with FEV1 60%, FVC 54%, ratio 112%, TLC 61%, DLCO 43% => restrictive defect.  Sleep study (7/14) with no OSA.  CT chest with areas of mosaic attenuation in lungs (saw pulmonary, thought air trapping and not ILD).   SH: Married, retired from a bank, 3 children, lives in [place].   FH: No heart disease, +HTN.  ROS: All systems reviewed and negative except as per HPI.   Current Outpatient Prescriptions  Medication Sig Dispense Refill  . amLODipine (NORVASC) 2.5 MG tablet Take 2.5 mg by mouth daily.      . HYDROcodone-acetaminophen (NORCO/VICODIN) 5-325 MG per tablet 1 tablet every 6 (six) hours as needed for moderate pain.       . Macitentan (OPSUMIT) 10 MG TABS Take 1 tablet by mouth daily with breakfast.  30 tablet  3  . potassium chloride SA (K-DUR,KLOR-CON) 20 MEQ tablet 2 tablets (total 40 mEq) two times a day  120 tablet  4  . rosuvastatin (CRESTOR) 20 MG tablet Take 20 mg by mouth at bedtime.       . Tadalafil, PAH, 20 MG TABS Take 40 mg by mouth daily.      .  tiotropium (SPIRIVA) 18 MCG inhalation capsule Place 1 capsule (18 mcg total) into inhaler and inhale daily.  10 capsule  0  . torsemide (DEMADEX) 20 MG tablet 1 and 1/2 tablets (total 30mg) two times a day  90 tablet  4  . warfarin (COUMADIN) 5 MG tablet Take 2.5-5 mg by mouth daily. Takes 1/2 tablet on Monday, Wednesday and Friday and 1 tablet all other days       No current facility-administered medications for this encounter.    BP 132/62  Pulse 90  Wt 171 lb 4 oz (77.678 kg)  SpO2 92% General: NAD Neck: JVP 8 cm, no thyromegaly or thyroid nodule.  Lungs: Slight dry crackles at bases bilaterally CV: Nondisplaced PMI.  Heart irregular S1/S2, 2/6 HSM LLSB.  Trace ankle edema.   No carotid bruit.  Normal pedal pulses.  Abdomen:   Soft, nontender, no hepatosplenomegaly, no distention.  Neurologic: Alert and oriented x 3.  Psych: Normal affect. Extremities: No clubbing or cyanosis. Right foot in boot post-fracture.   Assessment/Plan: 1. Pulmonary HTN: Patient has severe pulmonary arterial HTN out of proportion to mildly elevated PWCP on last right heart cath.  PVR was 10.4 WU and CI was 2.3 by Fick (1.6 by thermodilution).  It is possible that long-standing PCWP elevation from diastolic LV dysfunction could lead to pulmonary vascular changes with pulmonary arterial hypertension out of proportion to the PCWP elevation.  However, I cannot rule out possible idiopathic primary pulmonary HTN.  Collagen vascular disease workup was negative (negative RF, ANA and negative anti-SCL-70).  V/Q scan was not suggestive of chronic PEs.  PFTs were suggestive of restrictive lung disease but CT chest and evaluation by pulmonary did not suggest interstitial lung disease.  Sleep study did not show OSA.  I am concerned that about her response to dual therapy with macitentan and tadalafil.  She was clearly worse when she was out of tadalafil and macitentan.  She is unsure whether she was on the meds when she had her  last echo in 2/15. Now she thinks she may have been off meds.  Echo in 2/15 showed PA systolic pressure 86 mmHg.  She is much improved symptomatically today back on tadalafil and macitentan.  - Hold off on 6 minute walk: patient still has her right foot in a boot after ankle fracture.  - Continue tadalafil and macitentan. - RHC next week on meds.  If PA pressures high/CI low, will consider referral for Flolan.  2. Chronic diastolic CHF: EF preserved on echo with severe concentric LVH and a small pericardial effusion.  It is possible that the LVH is due to years of HTN.  The cardiac MRI was not definitively suggestive of cardiac amyloidosis.  I was still concerned for amyloidosis given the appearance of the LV myocardium on echoes.  SPEP/UPEP negative.  Abdominal fat pad biopsy recently showed no evidence for amyloidosis.  - Continue current torsemide for now.  Check BMET/BNP today. 3. HTN: BP stable.  Can continue current dose of amlodipine.  4. Chronic atrial fibrillation: Continue coumadin.  She is off metoprolol due to bradycardia.  I am going to get a 48 hour holter to assess rate control. 5. Presyncope: No further episodes since she has gotten back on her PAH meds and off metoprolol.  She had a number of potential reasons for presyncope: bradycardia, vasovagal (usually with micturation), and worsening pulmonary hypertension off her pulmonary vasodilators.   Dalton McLean 08/30/2013  

## 2013-09-01 ENCOUNTER — Encounter (INDEPENDENT_AMBULATORY_CARE_PROVIDER_SITE_OTHER): Payer: Medicare Other

## 2013-09-01 ENCOUNTER — Encounter: Payer: Self-pay | Admitting: Radiology

## 2013-09-01 ENCOUNTER — Ambulatory Visit (INDEPENDENT_AMBULATORY_CARE_PROVIDER_SITE_OTHER): Payer: Medicare Other | Admitting: Pharmacist

## 2013-09-01 DIAGNOSIS — I4891 Unspecified atrial fibrillation: Secondary | ICD-10-CM | POA: Diagnosis not present

## 2013-09-01 DIAGNOSIS — I498 Other specified cardiac arrhythmias: Secondary | ICD-10-CM

## 2013-09-01 DIAGNOSIS — R001 Bradycardia, unspecified: Secondary | ICD-10-CM

## 2013-09-01 LAB — POCT INR: INR: 1.5

## 2013-09-01 NOTE — Progress Notes (Signed)
Patient ID: Vicki [name], female   DOB: 02/19/1941, 73 y.o.   MRN: [id number] Evo 48hr holter applied

## 2013-09-05 ENCOUNTER — Other Ambulatory Visit: Payer: Medicare Other

## 2013-09-07 ENCOUNTER — Encounter (HOSPITAL_COMMUNITY)
Admission: RE | Disposition: A | Discharge status: Home / Self Care | Payer: Self-pay | Source: Ambulatory Visit | Attending: Cardiology

## 2013-09-07 ENCOUNTER — Other Ambulatory Visit: Payer: Self-pay | Admitting: Cardiology

## 2013-09-07 ENCOUNTER — Ambulatory Visit (HOSPITAL_COMMUNITY)
Admission: RE | Admit: 2013-09-07 | Discharge: 2013-09-07 | Disposition: A | Discharge status: Home / Self Care | Payer: Medicare Other | Source: Ambulatory Visit | Attending: Cardiology | Admitting: Cardiology

## 2013-09-07 DIAGNOSIS — I279 Pulmonary heart disease, unspecified: Secondary | ICD-10-CM

## 2013-09-07 DIAGNOSIS — I509 Heart failure, unspecified: Secondary | ICD-10-CM | POA: Insufficient documentation

## 2013-09-07 DIAGNOSIS — D693 Immune thrombocytopenic purpura: Secondary | ICD-10-CM | POA: Insufficient documentation

## 2013-09-07 DIAGNOSIS — I4891 Unspecified atrial fibrillation: Secondary | ICD-10-CM | POA: Diagnosis not present

## 2013-09-07 DIAGNOSIS — I2789 Other specified pulmonary heart diseases: Secondary | ICD-10-CM | POA: Diagnosis not present

## 2013-09-07 DIAGNOSIS — Z7901 Long term (current) use of anticoagulants: Secondary | ICD-10-CM | POA: Diagnosis not present

## 2013-09-07 DIAGNOSIS — I5032 Chronic diastolic (congestive) heart failure: Secondary | ICD-10-CM | POA: Diagnosis not present

## 2013-09-07 DIAGNOSIS — I10 Essential (primary) hypertension: Secondary | ICD-10-CM | POA: Insufficient documentation

## 2013-09-07 HISTORY — PX: RIGHT HEART CATHETERIZATION: SHX5447

## 2013-09-07 LAB — POCT I-STAT 3, VENOUS BLOOD GAS (G3P V)
Acid-Base Excess: 6 mmol/L — ABNORMAL HIGH (ref 0.0–2.0)
Bicarbonate: 32.3 mEq/L — ABNORMAL HIGH (ref 20.0–24.0)
O2 SAT: 64 %
PCO2 VEN: 49.7 mmHg (ref 45.0–50.0)
PO2 VEN: 33 mmHg (ref 30.0–45.0)
TCO2: 34 mmol/L (ref 0–100)
pH, Ven: 7.421 — ABNORMAL HIGH (ref 7.250–7.300)

## 2013-09-07 LAB — BASIC METABOLIC PANEL
BUN: 13 mg/dL (ref 6–23)
CO2: 29 mEq/L (ref 19–32)
Calcium: 9.5 mg/dL (ref 8.4–10.5)
Chloride: 99 mEq/L (ref 96–112)
Creatinine, Ser: 0.88 mg/dL (ref 0.50–1.10)
GFR calc Af Amer: 74 mL/min — ABNORMAL LOW (ref >90)
GFR, EST NON AFRICAN AMERICAN: 64 mL/min — AB (ref >90)
Glucose, Bld: 91 mg/dL (ref 70–99)
Potassium: 3.8 mEq/L (ref 3.7–5.3)
SODIUM: 141 meq/L (ref 137–147)

## 2013-09-07 LAB — PROTIME-INR
INR: 1.63 — AB (ref 0.00–1.49)
PROTHROMBIN TIME: 18.9 s — AB (ref 11.6–15.2)

## 2013-09-07 LAB — CBC
HCT: 44.8 % (ref 36.0–46.0)
Hemoglobin: 14.6 g/dL (ref 12.0–15.0)
MCH: 31.1 pg (ref 26.0–34.0)
MCHC: 32.6 g/dL (ref 30.0–36.0)
MCV: 95.5 fL (ref 78.0–100.0)
PLATELETS: 112 10*3/uL — AB (ref 150–400)
RBC: 4.69 MIL/uL (ref 3.87–5.11)
RDW: 15.6 % — ABNORMAL HIGH (ref 11.5–15.5)
WBC: 4.2 10*3/uL (ref 4.0–10.5)

## 2013-09-07 SURGERY — RIGHT HEART CATH
Anesthesia: LOCAL

## 2013-09-07 MED ORDER — ONDANSETRON HCL 4 MG/2ML IJ SOLN: 4.0000 mg | Freq: Four times a day (QID) | INTRAMUSCULAR | Status: DC | PRN

## 2013-09-07 MED ORDER — LIDOCAINE HCL (PF) 1 % IJ SOLN
INTRAMUSCULAR | Status: AC
  Filled 2013-09-07: qty 30

## 2013-09-07 MED ORDER — FENTANYL CITRATE 0.05 MG/ML IJ SOLN
INTRAMUSCULAR | Status: AC
  Filled 2013-09-07: qty 2

## 2013-09-07 MED ORDER — SODIUM CHLORIDE 0.9 % IV SOLN
250.0000 mL | INTRAVENOUS | Status: DC | PRN
Start: 2013-09-07 — End: 2013-09-07

## 2013-09-07 MED ORDER — ACETAMINOPHEN 325 MG PO TABS: 650.0000 mg | ORAL_TABLET | ORAL | Status: DC | PRN

## 2013-09-07 MED ORDER — MIDAZOLAM HCL 2 MG/2ML IJ SOLN
INTRAMUSCULAR | Status: AC
  Filled 2013-09-07: qty 2

## 2013-09-07 MED ORDER — SODIUM CHLORIDE 0.9 % IJ SOLN: 3.0000 mL | Freq: Two times a day (BID) | INTRAMUSCULAR | Status: DC

## 2013-09-07 MED ORDER — ASPIRIN 81 MG PO CHEW
81.0000 mg | CHEWABLE_TABLET | ORAL | Status: DC
  Filled 2013-09-07: qty 1

## 2013-09-07 MED ORDER — SODIUM CHLORIDE 0.9 % IJ SOLN: 3.0000 mL | INTRAMUSCULAR | Status: DC | PRN

## 2013-09-07 MED ORDER — HEPARIN (PORCINE) IN NACL 2-0.9 UNIT/ML-% IJ SOLN
INTRAMUSCULAR | Status: AC
  Filled 2013-09-07: qty 1000

## 2013-09-07 NOTE — CV Procedure (Signed)
    Cardiac Catheterization Procedure Note  Name: Vicki [name] MRN: [id number] DOB: [birth year]  Procedure: Right Heart Cath  Indication: Pulmonary hypertension   Procedural Details: There was an IV in the right brachial area.  Using sterile technique, this was exchanged for a 44F venous sheath. A Swan-Ganz catheter was used for the right heart catheterization. Standard protocol was followed for recording of right heart pressures and sampling of oxygen saturations. Fick cardiac output was calculated. There were no immediate procedural complications. The patient was transferred to the post catheterization recovery area for further monitoring.  Procedural Findings: Hemodynamics (mmHg) RA mean 5 RV RV 66/5 PA 66/31, mean 43 PCWP mean 11  Oxygen saturations: PA 64% AO 98%  Cardiac Output (Fick) 3.47  Cardiac Index (Fick) 1.97 PVR 9.2 WU   Final Conclusions:  Moderate to severe pulmonary arterial hypertension with decreased cardiac index and high PVR.  She is already on macitentan and tadalafil.  I am going to look into evaluation for Flolan at this point.    [name] 09/07/2013, 11:01 AM

## 2013-09-07 NOTE — Discharge Instructions (Signed)
CALL DR MCLEAN'S OFFICE IF ANY PROBLEMS,QUESTIONS, OR CONCERNS ; CALL IF ANY REDNESS,DRAINAGE,FEVER,PAIN, BLEEDING, OR SWELLING AT IV SITES

## 2013-09-07 NOTE — Progress Notes (Signed)
NO ASA PER JENNIFER,RN

## 2013-09-07 NOTE — H&P (View-Only) (Signed)
Patient ID: Vicki [name], female   DOB: 1941-05-30, 73 y.o.   MRN: [id number] PCP: Dr. Wilson Singer  73 yo with history of chronic diastolic CHF and chronic atrial fibrillation presents for followup of pulmonary hypertension.  Patient was followed by Dr. Glade Lloyd in the past for chronic atrial fibrillation.  She has been on coumadin.  She reports progressive exertional dyspnea since 2011.  This gradually worsened and became quite significant over the last few months.  She used to have significant HTN, but more recently her BP has been on the lower side.  Echo was done in 2/14, showing severe concentric LVH with EF 55-60%, moderately dilated RV, moderate to severe TR, and PA systolic pressure 86 mmHg.  I did a right heart cath in 5/14.  This showed PA pressure 104/36 with PCWP 20, suggesting pulmonary arterial HTN well out of proportion to the mildly elevate wedge pressure.  She was already on amlodipine so I did not do vasodilator testing.  V/Q scan was done, showing no evidence for chronic PEs.  PFTs showed a restrictive defect. Cardiac MRI did not show definite evidence for amyloid.  I started her on macitentan 10 mg daily.  Initially, she felt better on macitentan.  However, she was admitted in 5/14 from her sleep study due to orthopnea and dyspnea.  She was diuresed for several days and diuretic was switched over to torsemide.  I next started her on tadalafil 20 mg daily and titrated up to 40 mg daily.  She thinks that this helped.  She saw pulmonary after chest CT (showed mosaic attenuation in lungs).  This was thought to be due to air-trapping rather than ILD.  She was started on Spiriva. She wears oxygen at home.   She had an echocardiogram in 2/15 that showed severe LVH, EF 75%, small pericardial effusion, mildly dilated RV with mildly decreased systolic function, PA systolic pressure 84 mmHg.  She is not totally sure if she was taking macitentan and tadalafil at that time.  She has had a hard month.  She ran  out of her macitentan and tadalafil and her insurance company refused to refill them.  After this, she took a decided turn for the worse.  [place] in [place].  She became much more short of breath, just with walking around the house. She developed lightheaded spells, especially with micturation.  Given lightheadedness and presyncope, she was admitted last week.  She was restarted on her medications and she finally got back on her meds at home.  In the hospital, she was noted to be bradycardic with HR to 30s so metoprolol was stopped.  She has been checking her pulse at home and it has been in the 60s at rest.  No further lightheadedness/presyncope now that she is back on her meds.  Breathing has been much better.  She can walk on flat ground without problems. No chest pain.  Of note, her abdominal fat pad biopsy did not suggest amyloidosis.   6 minute walk (5/14): 122 m.   6 minute walk (7/14): 152 m 6 minute walk (10/14): 183 m  Labs (12/13): HCT 45.1, plts 153, K 3.5, creatinine 1.0 Labs (1/14): BNP 849 Labs (4/14): K 3.1, creatinine 1.0, ANA and anti-SCL-70 antibody negative.  Rheumatoid factor negative.  Serum immunofixation did not show monoclonal light chains.  Labs (5/14): K 3.3, creatinine 0.79, proBNP 5147 Labs (6/14): K 4, creatinine  0.9, BNP 1001 Labs (10/14): LDL 53, LDL-P 975, K 3.7, creatinine 1.2 Labs (11/14): K 3.7, creatinine 0.9, BNP 832 Labs (2/15): K 3.5, creatinine 1.10, HCT 42.6, UPEP negative, SPEP negative.   PMH: 1. Chronic diastolic CHF: Echo (2/59) with EF 55-60%, severe LVH (no SAM, no asymmetric hypertrophy, no LVOT gradient), moderate-severe LAE, moderately dilated RV with mildly decreased systolic function, moderate to severe RAE, PA systolic pressure 86 mmHg, moderate-severe TR, moderate MR, trivial pericardial effusion.  Cardiac MRI (5/14): EF 65%, severe LVH, no definite evidence for  amyloidosis (no delayed enhancement, myocardium not difficult to null).  Echo (2/15) with EF 75%, severe LVH, grade II diastolic dysfunction, moderate MR, RV mildly dilated with mildly decreased systolic function, moderate TR, PA systolic pressure 84 mmHg, small pericardial effusion. Abdominal fat pad biopsy (2/15) showed no evidence for amyloidosis.  SPEP/UPEP negative 2/15.  2. Chronic atrial fibrillation since around 2004.  Developed bradycardia and metoprolol stopped 2/15.  3. HTN: For decades.  4. LHC (2/08) with no significant disease.  5. Chronic thrombocytopenia: ITP 6. Pulmonary arterial HTN: RHC (5/14) with mean RA 13, PA 104/36 (mean 63), mean PCWP 20 on right and 23 on left, CI 2.3 (Fick) and 1.6 (thermo), PVR 10.4 WU (Fick) and 15 WU (thermo).  Vasodilator testing not done as patient was already on amlodipine.  V/Q scan (5/14) with no evidence for chronic PE.  ANA, RF, and anti-SCL70 antibody negative.  PFTs (5/14) with FEV1 60%, FVC 54%, ratio 112%, TLC 61%, DLCO 43% => restrictive defect.  Sleep study (7/14) with no OSA.  CT chest with areas of mosaic attenuation in lungs (saw pulmonary, thought air trapping and not ILD).   SH: Married, retired from a bank, 3 children, lives in [place].   FH: No heart disease, +HTN.  ROS: All systems reviewed and negative except as per HPI.   Current Outpatient Prescriptions  Medication Sig Dispense Refill  . amLODipine (NORVASC) 2.5 MG tablet Take 2.5 mg by mouth daily.      Marland Kitchen HYDROcodone-acetaminophen (NORCO/VICODIN) 5-325 MG per tablet 1 tablet every 6 (six) hours as needed for moderate pain.       . Macitentan (OPSUMIT) 10 MG TABS Take 1 tablet by mouth daily with breakfast.  30 tablet  3  . potassium chloride SA (K-DUR,KLOR-CON) 20 MEQ tablet 2 tablets (total 40 mEq) two times a day  120 tablet  4  . rosuvastatin (CRESTOR) 20 MG tablet Take 20 mg by mouth at bedtime.       . Tadalafil, PAH, 20 MG TABS Take 40 mg by mouth daily.      Marland Kitchen  tiotropium (SPIRIVA) 18 MCG inhalation capsule Place 1 capsule (18 mcg total) into inhaler and inhale daily.  10 capsule  0  . torsemide (DEMADEX) 20 MG tablet 1 and 1/2 tablets (total 27m) two times a day  90 tablet  4  . warfarin (COUMADIN) 5 MG tablet Take 2.5-5 mg by mouth daily. Takes 1/2 tablet on Monday, Wednesday and Friday and 1 tablet all other days       No current facility-administered medications for this encounter.    BP 132/62  Pulse 90  Wt 171 lb 4 oz (77.678 kg)  SpO2 92% General: NAD Neck: JVP 8 cm, no thyromegaly or thyroid nodule.  Lungs: Slight dry crackles at bases bilaterally CV: Nondisplaced PMI.  Heart irregular S1/S2, 2/6 HSM LLSB.  Trace ankle edema.   No carotid bruit.  Normal pedal pulses.  Abdomen:  Soft, nontender, no hepatosplenomegaly, no distention.  Neurologic: Alert and oriented x 3.  Psych: Normal affect. Extremities: No clubbing or cyanosis. Right foot in boot post-fracture.   Assessment/Plan: 1. Pulmonary HTN: Patient has severe pulmonary arterial HTN out of proportion to mildly elevated PWCP on last right heart cath.  PVR was 10.4 WU and CI was 2.3 by Fick (1.6 by thermodilution).  It is possible that long-standing PCWP elevation from diastolic LV dysfunction could lead to pulmonary vascular changes with pulmonary arterial hypertension out of proportion to the PCWP elevation.  However, I cannot rule out possible idiopathic primary pulmonary HTN.  Collagen vascular disease workup was negative (negative RF, ANA and negative anti-SCL-70).  V/Q scan was not suggestive of chronic PEs.  PFTs were suggestive of restrictive lung disease but CT chest and evaluation by pulmonary did not suggest interstitial lung disease.  Sleep study did not show OSA.  I am concerned that about her response to dual therapy with macitentan and tadalafil.  She was clearly worse when she was out of tadalafil and macitentan.  She is unsure whether she was on the meds when she had her  last echo in 2/15. Now she thinks she may have been off meds.  Echo in 5/94 showed PA systolic pressure 86 mmHg.  She is much improved symptomatically today back on tadalafil and macitentan.  - Hold off on 6 minute walk: patient still has her right foot in a boot after ankle fracture.  - Continue tadalafil and macitentan. - RHC next week on meds.  If PA pressures high/CI low, will consider referral for [place].  2. Chronic diastolic CHF: EF preserved on echo with severe concentric LVH and a small pericardial effusion.  It is possible that the LVH is due to years of HTN.  The cardiac MRI was not definitively suggestive of cardiac amyloidosis.  I was still concerned for amyloidosis given the appearance of the LV myocardium on echoes.  SPEP/UPEP negative.  Abdominal fat pad biopsy recently showed no evidence for amyloidosis.  - Continue current torsemide for now.  Check BMET/BNP today. 3. HTN: BP stable.  Can continue current dose of amlodipine.  4. Chronic atrial fibrillation: Continue coumadin.  She is off metoprolol due to bradycardia.  I am going to get a 48 hour holter to assess rate control. 5. Presyncope: No further episodes since she has gotten back on her PAH meds and off metoprolol.  She had a number of potential reasons for presyncope: bradycardia, vasovagal (usually with micturation), and worsening pulmonary hypertension off her pulmonary vasodilators.   [name] 08/30/2013

## 2013-09-07 NOTE — Progress Notes (Signed)
CALLED CLIENT AT HOME AND GAVE HER APPT  DATE AND TIME FOR FOLLOWUP WITH DR [facility] AND SHE WROTE DOWN APPT INFO

## 2013-09-07 NOTE — Progress Notes (Signed)
Per Dr Aundra Dubin ok to d/c at 1200

## 2013-09-07 NOTE — Interval H&P Note (Signed)
History and Physical Interval Note:  09/07/2013 10:41 AM  Vicki [name]  has presented today for surgery, with the diagnosis of pulmonary hypertension  The various methods of treatment have been discussed with the patient and family. After consideration of risks, benefits and other options for treatment, the patient has consented to  Procedure(s): RIGHT HEART CATH (N/A) as a surgical intervention .  The patient's history has been reviewed, patient examined, no change in status, stable for surgery.  I have reviewed the patient's chart and labs.  Questions were answered to the patient's satisfaction.     [place] [employer]

## 2013-09-08 DIAGNOSIS — S8263XA Displaced fracture of lateral malleolus of unspecified fibula, initial encounter for closed fracture: Secondary | ICD-10-CM | POA: Diagnosis not present

## 2013-09-14 ENCOUNTER — Ambulatory Visit (INDEPENDENT_AMBULATORY_CARE_PROVIDER_SITE_OTHER): Payer: Medicare Other | Admitting: *Deleted

## 2013-09-14 ENCOUNTER — Ambulatory Visit: Payer: Medicare Other | Admitting: Cardiology

## 2013-09-14 ENCOUNTER — Ambulatory Visit (HOSPITAL_COMMUNITY)
Admission: RE | Admit: 2013-09-14 | Discharge: 2013-09-14 | Disposition: A | Discharge status: Home / Self Care | Payer: Medicare Other | Source: Ambulatory Visit | Attending: Internal Medicine | Admitting: Internal Medicine

## 2013-09-14 VITALS — BP 110/68 | HR 92 | Wt 168.2 lb

## 2013-09-14 DIAGNOSIS — I2789 Other specified pulmonary heart diseases: Secondary | ICD-10-CM | POA: Insufficient documentation

## 2013-09-14 DIAGNOSIS — I5032 Chronic diastolic (congestive) heart failure: Secondary | ICD-10-CM | POA: Insufficient documentation

## 2013-09-14 DIAGNOSIS — I272 Pulmonary hypertension, unspecified: Secondary | ICD-10-CM

## 2013-09-14 DIAGNOSIS — I509 Heart failure, unspecified: Secondary | ICD-10-CM | POA: Diagnosis not present

## 2013-09-14 DIAGNOSIS — I4891 Unspecified atrial fibrillation: Secondary | ICD-10-CM | POA: Diagnosis not present

## 2013-09-14 LAB — POCT INR: INR: 1.8

## 2013-09-14 NOTE — Progress Notes (Signed)
Patient ID: Vicki [name], female   DOB: 03-02-1941, 73 y.o.   MRN: [id number] PCP: Dr. Wilson Singer  Vicki [name] is a 73 yo with history of chronic diastolic CHF and chronic atrial fibrillation presents for followup of pulmonary hypertension.  Patient was followed by Dr. Glade Lloyd in the past for chronic atrial fibrillation.  She has been on coumadin.  She reports progressive exertional dyspnea since 2011.  This gradually worsened and became quite significant over the last few months.  She used to have significant HTN, but more recently her BP has been on the lower side.  Echo was done in 2/14, showing severe concentric LVH with EF 55-60%, moderately dilated RV, moderate to severe TR, and PA systolic pressure 86 mmHg.  I did a right heart cath in 5/14.  This showed PA pressure 104/36 with PCWP 20, suggesting pulmonary arterial HTN well out of proportion to the mildly elevate wedge pressure.  She was already on amlodipine so I did not do vasodilator testing.  V/Q scan was done, showing no evidence for chronic PEs.  PFTs showed a restrictive defect. Cardiac MRI did not show definite evidence for amyloid.  I started her on macitentan 10 mg daily.  Initially, she felt better on macitentan.  However, she was admitted in 5/14 from her sleep study due to orthopnea and dyspnea.  She was diuresed for several days and diuretic was switched over to torsemide.  I next started her on tadalafil 20 mg daily and titrated up to 40 mg daily.  She thinks that this helped.  She saw pulmonary after chest CT (showed mosaic attenuation in lungs).  This was thought to be due to air-trapping rather than ILD.  She was started on Spiriva. She wears oxygen at home.   She had an echocardiogram in 2/15 that showed severe LVH, EF 75%, small pericardial effusion, mildly dilated RV with mildly decreased systolic function, PA systolic pressure 84 mmHg.  She is not totally sure if she was taking macitentan and tadalafil at that time.  She has had a hard  month.  She ran out of her macitentan and tadalafil and her insurance company refused to refill them.  After this, she took a decided turn for the worse.  [place] her foot in the fall.  She became much more short of breath, just with walking around the house. She developed lightheaded spells, especially with micturation.  Given lightheadedness and presyncope, she was admitted last week.  She was restarted on her medications and she finally got back on her meds at home.  In the hospital, she was noted to be bradycardic with HR to 30s so metoprolol was stopped.  Of note, her abdominal fat pad biopsy did not suggest amyloidosis.  Follow up: She underwent RHC last week as below - decision made to add Tyvaso. Feeling much better. Able to do all ADLs. Cooks and cleans. Able to walk. Without a problem. Avoiding steps. No further dizziness. Edema well controlled.    RA mean 5  RV RV 66/5  PA 66/31, mean 43  PCWP mean 11  Oxygen saturations:  PA 64%  AO 98%  Cardiac Output (Fick) 3.47  Cardiac Index (Fick) 1.97  PVR 9.2 WU   6 minute walk (5/14): 122 m.   6 minute walk (7/14): 152 m 6 minute walk (10/14): 183 m  Labs (12/13): HCT 45.1, plts 153, K 3.5, creatinine 1.0 Labs (1/14): BNP  849 Labs (4/14): K 3.1, creatinine 1.0, ANA and anti-SCL-70 antibody negative.  Rheumatoid factor negative.  Serum immunofixation did not show monoclonal light chains.  Labs (5/14): K 3.3, creatinine 0.79, proBNP 5147 Labs (6/14): K 4, creatinine 0.9, BNP 1001 Labs (10/14): LDL 53, LDL-P 975, K 3.7, creatinine 1.2 Labs (11/14): K 3.7, creatinine 0.9, BNP 832 Labs (2/15): K 3.5, creatinine 1.10, HCT 42.6, UPEP negative, SPEP negative.   PMH: 1. Chronic diastolic CHF: Echo (3/66) with EF 55-60%, severe LVH (no SAM, no asymmetric hypertrophy, no LVOT gradient), moderate-severe LAE, moderately dilated RV with mildly decreased systolic function, moderate  to severe RAE, PA systolic pressure 86 mmHg, moderate-severe TR, moderate MR, trivial pericardial effusion.  Cardiac MRI (5/14): EF 65%, severe LVH, no definite evidence for amyloidosis (no delayed enhancement, myocardium not difficult to null).  Echo (2/15) with EF 75%, severe LVH, grade II diastolic dysfunction, moderate MR, RV mildly dilated with mildly decreased systolic function, moderate TR, PA systolic pressure 84 mmHg, small pericardial effusion. Abdominal fat pad biopsy (2/15) showed no evidence for amyloidosis.  SPEP/UPEP negative 2/15.  2. Chronic atrial fibrillation since around 2004.  Developed bradycardia and metoprolol stopped 2/15.  3. HTN: For decades.  4. LHC (2/08) with no significant disease.  5. Chronic thrombocytopenia: ITP 6. Pulmonary arterial HTN: RHC (5/14) with mean RA 13, PA 104/36 (mean 63), mean PCWP 20 on right and 23 on left, CI 2.3 (Fick) and 1.6 (thermo), PVR 10.4 WU (Fick) and 15 WU (thermo).  Vasodilator testing not done as patient was already on amlodipine.  V/Q scan (5/14) with no evidence for chronic PE.  ANA, RF, and anti-SCL70 antibody negative.  PFTs (5/14) with FEV1 60%, FVC 54%, ratio 112%, TLC 61%, DLCO 43% => restrictive defect.  Sleep study (7/14) with no OSA.  CT chest with areas of mosaic attenuation in lungs (saw pulmonary, thought air trapping and not ILD).   SH: Married, retired from a bank, 3 children, lives in [place].   FH: No heart disease, +HTN.  ROS: All systems reviewed and negative except as per HPI.   Current Outpatient Prescriptions  Medication Sig Dispense Refill  . amLODipine (NORVASC) 2.5 MG tablet Take 2.5 mg by mouth daily.      Marland Kitchen amLODipine (NORVASC) 2.5 MG tablet TAKE ONE TABLET BY MOUTH ONCE DAILY  30 tablet  0  . HYDROcodone-acetaminophen (NORCO/VICODIN) 5-325 MG per tablet 1 tablet every 6 (six) hours as needed for moderate pain.       . Macitentan (OPSUMIT) 10 MG TABS Take 1 tablet by mouth daily with breakfast.  30 tablet   3  . potassium chloride SA (K-DUR,KLOR-CON) 20 MEQ tablet 2 tablets (total 40 mEq) two times a day  120 tablet  4  . rosuvastatin (CRESTOR) 20 MG tablet Take 20 mg by mouth at bedtime.       . Tadalafil, PAH, 20 MG TABS Take 40 mg by mouth daily.      Marland Kitchen tiotropium (SPIRIVA) 18 MCG inhalation capsule Place 1 capsule (18 mcg total) into inhaler and inhale daily.  10 capsule  0  . torsemide (DEMADEX) 20 MG tablet 1 and 1/2 tablets (total 35m) two times a day  90 tablet  4  . warfarin (COUMADIN) 5 MG tablet Take 2.5-5 mg by mouth daily. Takes 1/2 tablet on Monday, Wednesday and Friday and 1 tablet all other days       No current facility-administered medications for this encounter.    BP 110/68  Pulse 92  Wt 168 lb 4 oz (76.318 kg)  SpO2 91% General: NAD Neck: JVP 6 cm, no thyromegaly or thyroid nodule.  Lungs: Slight dry crackles at bases bilaterally otherwise clear CV: Nondisplaced PMI.  Heart irregular S1/S2, 2/6 HSM LLSB.  No ankle edema.   No carotid bruit.  Normal pedal pulses.  Abdomen: Soft, nontender, no hepatosplenomegaly, no distention.  Neurologic: Alert and oriented x 3.  Psych: Normal affect. Extremities: No clubbing or cyanosis. Right foot in boot post-fracture.   Assessment/Plan: 1. Pulmonary HTN: Patient has severe pulmonary arterial HTN out of proportion to mildly elevated PWCP on last right heart cath.  PVR was 10.4 WU and CI was 2.3 by Fick (1.6 by thermodilution).  It is possible that long-standing PCWP elevation from diastolic LV dysfunction could lead to pulmonary vascular changes with pulmonary arterial hypertension out of proportion to the PCWP elevation.  However, I cannot rule out possible idiopathic primary pulmonary HTN.  Collagen vascular disease workup was negative (negative RF, ANA and negative anti-SCL-70).  V/Q scan was not suggestive of chronic PEs.  PFTs were suggestive of restrictive lung disease but CT chest and evaluation by pulmonary did not suggest  interstitial lung disease.  Sleep study did not show OSA.  I am concerned that about her response to dual therapy with macitentan and tadalafil.  She was clearly worse when she was out of tadalafil and macitentan.  She is unsure whether she was on the meds when she had her last echo in 2/15. Now she thinks she may have been off meds.  Echo in 5/87 showed PA systolic pressure 86 mmHg.  She is much improved symptomatically today back on tadalafil and macitentan.  --Agree with plan to add Tyvaso. Paperwork currently in process 2. Chronic diastolic CHF: EF preserved on echo with severe concentric LVH and a small pericardial effusion.  It is possible that the LVH is due to years of HTN.  The cardiac MRI was not definitively suggestive of cardiac amyloidosis.  Dr. Aundra Dubin was still concerned for amyloidosis given the appearance of the LV myocardium on echoes.  SPEP/UPEP negative.  Abdominal fat pad biopsy recently showed no evidence for amyloidosis.  - Continue current torsemide for now.  3. HTN: BP stable.  Can continue current dose of amlodipine.  4. Chronic atrial fibrillation: Continue coumadin.  She is off metoprolol due to bradycardia. She just wore a 48 hour holter to assess rate control. Results in computer show the report is still in process. 5. Presyncope: No further episodes since she has gotten back on her PAH meds and off metoprolol.  She had a number of potential reasons for presyncope: bradycardia, vasovagal (usually with micturation), and worsening pulmonary hypertension off her pulmonary vasodilators.   Vicki Bickers MD 09/14/2013

## 2013-09-14 NOTE — Patient Instructions (Signed)
Your physician recommends that you schedule a follow-up appointment in: 1 month

## 2013-09-14 NOTE — Addendum Note (Signed)
Encounter addended by: Scarlette Calico, RN on: 09/14/2013  3:09 PM<BR>     Documentation filed: Patient Instructions Section

## 2013-09-19 ENCOUNTER — Telehealth: Payer: Self-pay | Admitting: Cardiology

## 2013-09-19 ENCOUNTER — Encounter (HOSPITAL_COMMUNITY): Payer: Medicare Other

## 2013-09-19 NOTE — Telephone Encounter (Signed)
New Message:  Vicki [name] states she is faxing a form over in reference to the patient's medicines. She states she has filled it out and just needs Dr. Aundra Dubin to sign and date it. And fax back to the number below.  [phone]

## 2013-09-19 NOTE — Telephone Encounter (Signed)
LM for Butch Penny that form should be faxed to [facility], [phone]

## 2013-09-21 ENCOUNTER — Encounter (HOSPITAL_COMMUNITY): Payer: Medicare Other

## 2013-09-22 ENCOUNTER — Ambulatory Visit (INDEPENDENT_AMBULATORY_CARE_PROVIDER_SITE_OTHER): Payer: Medicare Other

## 2013-09-22 DIAGNOSIS — I4891 Unspecified atrial fibrillation: Secondary | ICD-10-CM | POA: Diagnosis not present

## 2013-09-22 DIAGNOSIS — Z5181 Encounter for therapeutic drug level monitoring: Secondary | ICD-10-CM

## 2013-09-22 LAB — POCT INR: INR: 2

## 2013-09-27 NOTE — Telephone Encounter (Signed)
Form signed by Dr Aundra Dubin and faxed back

## 2013-09-28 DIAGNOSIS — S8263XA Displaced fracture of lateral malleolus of unspecified fibula, initial encounter for closed fracture: Secondary | ICD-10-CM | POA: Diagnosis not present

## 2013-09-29 ENCOUNTER — Ambulatory Visit (INDEPENDENT_AMBULATORY_CARE_PROVIDER_SITE_OTHER): Payer: Medicare Other | Admitting: Pharmacist

## 2013-09-29 DIAGNOSIS — I4891 Unspecified atrial fibrillation: Secondary | ICD-10-CM

## 2013-09-29 DIAGNOSIS — Z5181 Encounter for therapeutic drug level monitoring: Secondary | ICD-10-CM

## 2013-09-29 LAB — POCT INR: INR: 2.2

## 2013-10-03 ENCOUNTER — Other Ambulatory Visit: Payer: Self-pay | Admitting: Cardiology

## 2013-10-13 ENCOUNTER — Ambulatory Visit (INDEPENDENT_AMBULATORY_CARE_PROVIDER_SITE_OTHER): Payer: Medicare Other | Admitting: Pharmacist

## 2013-10-13 ENCOUNTER — Ambulatory Visit (HOSPITAL_COMMUNITY)
Admission: RE | Admit: 2013-10-13 | Discharge: 2013-10-13 | Disposition: A | Discharge status: Home / Self Care | Payer: Medicare Other | Source: Ambulatory Visit | Attending: Internal Medicine | Admitting: Internal Medicine

## 2013-10-13 VITALS — BP 126/74 | HR 62 | Wt 170.8 lb

## 2013-10-13 DIAGNOSIS — I2789 Other specified pulmonary heart diseases: Secondary | ICD-10-CM | POA: Diagnosis not present

## 2013-10-13 DIAGNOSIS — I4891 Unspecified atrial fibrillation: Secondary | ICD-10-CM

## 2013-10-13 DIAGNOSIS — Z5181 Encounter for therapeutic drug level monitoring: Secondary | ICD-10-CM | POA: Diagnosis not present

## 2013-10-13 DIAGNOSIS — R001 Bradycardia, unspecified: Secondary | ICD-10-CM

## 2013-10-13 DIAGNOSIS — I5033 Acute on chronic diastolic (congestive) heart failure: Secondary | ICD-10-CM | POA: Insufficient documentation

## 2013-10-13 DIAGNOSIS — I5032 Chronic diastolic (congestive) heart failure: Secondary | ICD-10-CM

## 2013-10-13 DIAGNOSIS — I509 Heart failure, unspecified: Secondary | ICD-10-CM

## 2013-10-13 DIAGNOSIS — I498 Other specified cardiac arrhythmias: Secondary | ICD-10-CM

## 2013-10-13 DIAGNOSIS — I272 Pulmonary hypertension, unspecified: Secondary | ICD-10-CM

## 2013-10-13 LAB — BASIC METABOLIC PANEL
BUN: 17 mg/dL (ref 6–23)
CO2: 28 mEq/L (ref 19–32)
Calcium: 9.5 mg/dL (ref 8.4–10.5)
Chloride: 103 mEq/L (ref 96–112)
Creatinine, Ser: 0.99 mg/dL (ref 0.50–1.10)
GFR calc Af Amer: 64 mL/min — ABNORMAL LOW (ref >90)
GFR, EST NON AFRICAN AMERICAN: 56 mL/min — AB (ref >90)
GLUCOSE: 83 mg/dL (ref 70–99)
Potassium: 3.7 mEq/L (ref 3.7–5.3)
Sodium: 144 mEq/L (ref 137–147)

## 2013-10-13 LAB — PRO B NATRIURETIC PEPTIDE: Pro B Natriuretic peptide (BNP): 1653 pg/mL — ABNORMAL HIGH (ref 0–125)

## 2013-10-13 LAB — POCT INR: INR: 2.4

## 2013-10-13 NOTE — Progress Notes (Signed)
6 min walk test completed, pt ambulated 1040 ft (317 m) on 5 L O2 via [place] O2 sats ranged from 93-86% HR ranged from 93-135, pt tolerated well

## 2013-10-13 NOTE — Patient Instructions (Signed)
Labs today  We will contact you in 2 months to schedule your next appointment.  

## 2013-10-14 NOTE — Progress Notes (Signed)
Patient ID: Vicki [name], female   DOB: 04/22/1941, 73 y.o.   MRN: [id number] PCP: Dr. Wilson Singer  Vicki [name] is a 73 yo with history of chronic diastolic CHF and chronic atrial fibrillation presents for followup of pulmonary hypertension.  Patient was followed by Dr. Glade Lloyd in the past for chronic atrial fibrillation.  She has been on coumadin.  She reports progressive exertional dyspnea since 2011.  This gradually worsened and became quite significant over the last few months.  She used to have significant HTN, but more recently her BP has been on the lower side.  Echo was done in 2/14, showing severe concentric LVH with EF 55-60%, moderately dilated RV, moderate to severe TR, and PA systolic pressure 86 mmHg.  I did a right heart cath in 5/14.  This showed PA pressure 104/36 with PCWP 20, suggesting pulmonary arterial HTN well out of proportion to the mildly elevate wedge pressure.  She was already on amlodipine so I did not do vasodilator testing.  V/Q scan was done, showing no evidence for chronic PEs.  PFTs showed a restrictive defect. Cardiac MRI did not show definite evidence for amyloid.  I started her on macitentan 10 mg daily.  Initially, she felt better on macitentan.  However, she was admitted in 5/14 from her sleep study due to orthopnea and dyspnea.  She was diuresed for several days and diuretic was switched over to torsemide.  I next started her on tadalafil 20 mg daily and titrated up to 40 mg daily.  She thinks that this helped.  She saw pulmonary after chest CT (showed mosaic attenuation in lungs).  This was thought to be due to air-trapping rather than ILD.  She was started on Spiriva. She wears oxygen at home.   She had an echocardiogram in 2/15 that showed severe LVH, EF 75%, small pericardial effusion, mildly dilated RV with mildly decreased systolic function, PA systolic pressure 84 mmHg.  She is not totally sure if she was taking macitentan and tadalafil at that time.  She has had a hard  month.  She ran out of her macitentan and tadalafil and her insurance company refused to refill them.  After this, she took a decided turn for the worse.  [place] in [place].  She became much more short of breath, just with walking around the house. She developed lightheaded spells, especially with micturation.  Given lightheadedness and presyncope, she was admitted last week.  She was restarted on her medications and she finally got back on her meds at home.  In the hospital, she was noted to be bradycardic with HR to 30s so metoprolol was stopped.  Of note, her abdominal fat pad biopsy did not suggest amyloidosis. Repeat RHC in 3/15 still with moderate to severe PAH and low CI.  Holter off beta blocker in 3/15 showed average HR 73.   She started Tyvaso 1 week ago.  She is doing better overall.  She walks around her back yard.  She was able to walk in [employer] yesterday.  Very little dyspnea when walking on flat ground, which is improved. No tachypalpitations, no lightheaded spells.  6 minute walk was done today and was considerably improved (see below).   6 minute walk (5/14): 122 m.   6 minute walk (7/14): 152 m 6 minute walk (10/14): 183 m 6 minute walk (4/15): 317 m  Labs (12/13): HCT 45.1, plts  153, K 3.5, creatinine 1.0 Labs (1/14): BNP 849 Labs (4/14): K 3.1, creatinine 1.0, ANA and anti-SCL-70 antibody negative.  Rheumatoid factor negative.  Serum immunofixation did not show monoclonal light chains.  Labs (5/14): K 3.3, creatinine 0.79, proBNP 5147 Labs (6/14): K 4, creatinine 0.9, BNP 1001 Labs (10/14): LDL 53, LDL-P 975, K 3.7, creatinine 1.2 Labs (11/14): K 3.7, creatinine 0.9, BNP 832 Labs (2/15): K 3.5, creatinine 1.10, HCT 42.6, UPEP negative, SPEP negative.  Labs (3/15): K 3.8, creatinine 0.88  PMH: 1. Chronic diastolic CHF: Echo (4/17) with EF 55-60%, severe LVH (no SAM, no asymmetric hypertrophy, no  LVOT gradient), moderate-severe LAE, moderately dilated RV with mildly decreased systolic function, moderate to severe RAE, PA systolic pressure 86 mmHg, moderate-severe TR, moderate MR, trivial pericardial effusion.  Cardiac MRI (5/14): EF 65%, severe LVH, no definite evidence for amyloidosis (no delayed enhancement, myocardium not difficult to null).  Echo (2/15) with EF 75%, severe LVH, grade II diastolic dysfunction, moderate MR, RV mildly dilated with mildly decreased systolic function, moderate TR, PA systolic pressure 84 mmHg, small pericardial effusion. Abdominal fat pad biopsy (2/15) showed no evidence for amyloidosis.  SPEP/UPEP negative 2/15.  2. Chronic atrial fibrillation since around 2004.  Developed bradycardia and metoprolol stopped 2/15. Holter (3/15) with average HR 73, atrial fibrillation, 3.8 sec pause x 1 while asleep, PVCs.  3. HTN: For decades.  4. LHC (2/08) with no significant disease.  5. Chronic thrombocytopenia: ITP 6. Pulmonary arterial HTN: RHC (5/14) with mean RA 13, PA 104/36 (mean 63), mean PCWP 20 on right and 23 on left, CI 2.3 (Fick) and 1.6 (thermo), PVR 10.4 WU (Fick) and 15 WU (thermo).  Vasodilator testing not done as patient was already on amlodipine.  V/Q scan (5/14) with no evidence for chronic PE.  ANA, RF, and anti-SCL70 antibody negative.  PFTs (5/14) with FEV1 60%, FVC 54%, ratio 112%, TLC 61%, DLCO 43% => restrictive defect.  Sleep study (7/14) with no OSA.  CT chest with areas of mosaic attenuation in lungs (saw pulmonary, thought air trapping and not ILD). RHC (3/15) with RA mean 5, PA 66/31 mean 43, PCWP mean 11, Cardiac Index (Fick) 1.97, PVR 9.2 WU.  Patient was started on Tyvaso in 3/15.    SH: Married, retired from a bank, 3 children, lives in [place].   FH: No heart disease, +HTN.  ROS: All systems reviewed and negative except as per HPI.   Current Outpatient Prescriptions  Medication Sig Dispense Refill  . amLODipine (NORVASC) 2.5 MG tablet  TAKE ONE TABLET BY MOUTH ONCE DAILY  30 tablet  0  . HYDROcodone-acetaminophen (NORCO/VICODIN) 5-325 MG per tablet 1 tablet every 6 (six) hours as needed for moderate pain.       . Macitentan (OPSUMIT) 10 MG TABS Take 1 tablet by mouth daily with breakfast.  30 tablet  3  . potassium chloride SA (K-DUR,KLOR-CON) 20 MEQ tablet 2 tablets (total 40 mEq) in AM and 1 tab (20 meq) in PM      . rosuvastatin (CRESTOR) 20 MG tablet Take 20 mg by mouth at bedtime.       . Tadalafil, PAH, 20 MG TABS Take 40 mg by mouth daily.      Marland Kitchen tiotropium (SPIRIVA) 18 MCG inhalation capsule Place 1 capsule (18 mcg total) into inhaler and inhale daily.  10 capsule  0  . torsemide (DEMADEX) 20 MG tablet 1 and 1/2 tablets (total 35m) two times a day  90 tablet  4  . Treprostinil (TYVASO) 0.6 MG/ML SOLN Inhale 18 mcg into the lungs 4 (four) times daily.      Marland Kitchen warfarin (COUMADIN) 5 MG tablet Take 2.5-5 mg by mouth daily. Takes 1/2 tablet on Monday, Wednesday and Friday and 1 tablet all other days       No current facility-administered medications for this encounter.    BP 126/74  Pulse 62  Wt 170 lb 12 oz (77.452 kg)  SpO2 92% General: NAD Neck: JVP 7-8 cm, no thyromegaly or thyroid nodule.  Lungs: Slight dry crackles at bases bilaterally otherwise clear CV: Nondisplaced PMI.  Heart irregular S1/S2, 2/6 HSM LLSB.  No ankle edema.  No carotid bruit.  Normal pedal pulses.  Abdomen: Soft, nontender, no hepatosplenomegaly, no distention.  Neurologic: Alert and oriented x 3.  Psych: Normal affect. Extremities: No clubbing or cyanosis. Right foot in boot post-fracture.   Assessment/Plan: 1. Pulmonary HTN: Patient has severe pulmonary arterial HTN.  Last RHC in 3/15 showed CI 1.97, PVR 9.  It is possible that long-standing PCWP elevation from diastolic LV dysfunction could lead to pulmonary vascular changes with pulmonary arterial hypertension out of proportion to the PCWP elevation.  However, I suspect idiopathic  primary pulmonary HTN.  Collagen vascular disease workup was negative (negative RF, ANA and negative anti-SCL-70).  V/Q scan was not suggestive of chronic PEs.  PFTs were suggestive of restrictive lung disease but CT chest and evaluation by pulmonary did not suggest interstitial lung disease.  Sleep study did not show OSA. She has been on tadalafil and macitentan.  She recently started Tyvaso and is feeling considerably better.  Her 6 minute walk is considerably improved (see above).   2. Chronic diastolic CHF: EF preserved on echo with severe concentric LVH and a small pericardial effusion.  It is possible that the LVH is due to years of HTN.  The cardiac MRI was not definitively suggestive of cardiac amyloidosis.  I was still concerned for amyloidosis given the appearance of the LV myocardium on echoes.  SPEP/UPEP negative.  Abdominal fat pad biopsy recently showed no evidence for amyloidosis.  - Continue current torsemide for now.  3. HTN: BP stable.  Can continue current dose of amlodipine.  4. Chronic atrial fibrillation: Continue coumadin.  She is off metoprolol due to bradycardia. Holter off metoprolol did not show any high rate episodes.    Larey Dresser MD 10/14/2013

## 2013-10-15 ENCOUNTER — Other Ambulatory Visit: Payer: Self-pay | Admitting: Cardiology

## 2013-10-19 ENCOUNTER — Telehealth (HOSPITAL_COMMUNITY): Payer: Self-pay | Admitting: Cardiology

## 2013-10-19 NOTE — Telephone Encounter (Signed)
Pt called to request an order for wheelchair Told pt unsure if cardiology would write order for this may need to go through [facility] i would ask provider

## 2013-10-19 NOTE — Telephone Encounter (Signed)
Spoke with pt advised she try pcp, she is agreeable

## 2013-10-26 ENCOUNTER — Ambulatory Visit (INDEPENDENT_AMBULATORY_CARE_PROVIDER_SITE_OTHER): Payer: Medicare Other | Admitting: Pharmacist

## 2013-10-26 DIAGNOSIS — I4891 Unspecified atrial fibrillation: Secondary | ICD-10-CM | POA: Diagnosis not present

## 2013-10-26 DIAGNOSIS — Z5181 Encounter for therapeutic drug level monitoring: Secondary | ICD-10-CM

## 2013-10-26 LAB — POCT INR: INR: 2.6

## 2013-10-31 ENCOUNTER — Other Ambulatory Visit: Payer: Self-pay | Admitting: Cardiology

## 2013-11-09 DIAGNOSIS — M25579 Pain in unspecified ankle and joints of unspecified foot: Secondary | ICD-10-CM | POA: Diagnosis not present

## 2013-11-09 DIAGNOSIS — S8263XA Displaced fracture of lateral malleolus of unspecified fibula, initial encounter for closed fracture: Secondary | ICD-10-CM | POA: Diagnosis not present

## 2013-11-14 ENCOUNTER — Other Ambulatory Visit: Payer: Self-pay | Admitting: Cardiology

## 2013-11-23 ENCOUNTER — Ambulatory Visit (INDEPENDENT_AMBULATORY_CARE_PROVIDER_SITE_OTHER): Payer: Medicare Other | Admitting: *Deleted

## 2013-11-23 DIAGNOSIS — Z5181 Encounter for therapeutic drug level monitoring: Secondary | ICD-10-CM

## 2013-11-23 DIAGNOSIS — I4891 Unspecified atrial fibrillation: Secondary | ICD-10-CM | POA: Diagnosis not present

## 2013-11-23 LAB — POCT INR: INR: 2.1

## 2013-11-29 DIAGNOSIS — E789 Disorder of lipoprotein metabolism, unspecified: Secondary | ICD-10-CM | POA: Diagnosis not present

## 2013-11-30 ENCOUNTER — Other Ambulatory Visit: Payer: Self-pay | Admitting: Cardiology

## 2013-11-30 MED ORDER — POTASSIUM CHLORIDE CRYS ER 20 MEQ PO TBCR: EXTENDED_RELEASE_TABLET | ORAL | Status: DC

## 2013-12-06 DIAGNOSIS — I270 Primary pulmonary hypertension: Secondary | ICD-10-CM | POA: Diagnosis not present

## 2013-12-06 DIAGNOSIS — I10 Essential (primary) hypertension: Secondary | ICD-10-CM | POA: Diagnosis not present

## 2013-12-06 DIAGNOSIS — E789 Disorder of lipoprotein metabolism, unspecified: Secondary | ICD-10-CM | POA: Diagnosis not present

## 2013-12-06 DIAGNOSIS — I4891 Unspecified atrial fibrillation: Secondary | ICD-10-CM | POA: Diagnosis not present

## 2013-12-12 ENCOUNTER — Other Ambulatory Visit: Payer: Self-pay

## 2013-12-12 MED ORDER — MACITENTAN 10 MG PO TABS: 1.0000 | ORAL_TABLET | Freq: Every day | ORAL | Status: DC

## 2013-12-13 ENCOUNTER — Other Ambulatory Visit: Payer: Self-pay | Admitting: Cardiology

## 2013-12-18 ENCOUNTER — Telehealth (HOSPITAL_COMMUNITY): Payer: Self-pay

## 2013-12-18 ENCOUNTER — Telehealth (HOSPITAL_COMMUNITY): Payer: Self-pay | Admitting: Vascular Surgery

## 2013-12-18 MED ORDER — TORSEMIDE 20 MG PO TABS: ORAL_TABLET | ORAL | Status: DC

## 2013-12-18 NOTE — Telephone Encounter (Signed)
Pt needs Refill Torsemide 20 mg

## 2013-12-18 NOTE — Telephone Encounter (Signed)
Refill of torsemide sent to preferred pharmacy electronically, pt made aware.

## 2013-12-21 ENCOUNTER — Ambulatory Visit (INDEPENDENT_AMBULATORY_CARE_PROVIDER_SITE_OTHER): Payer: Medicare Other | Admitting: *Deleted

## 2013-12-21 DIAGNOSIS — I4891 Unspecified atrial fibrillation: Secondary | ICD-10-CM | POA: Diagnosis not present

## 2013-12-21 DIAGNOSIS — Z5181 Encounter for therapeutic drug level monitoring: Secondary | ICD-10-CM

## 2013-12-21 LAB — POCT INR: INR: 3.1

## 2014-01-08 ENCOUNTER — Ambulatory Visit (HOSPITAL_COMMUNITY)
Admission: RE | Admit: 2014-01-08 | Discharge: 2014-01-08 | Disposition: A | Discharge status: Home / Self Care | Payer: Medicare Other | Source: Ambulatory Visit | Attending: Cardiology | Admitting: Cardiology

## 2014-01-08 ENCOUNTER — Encounter (HOSPITAL_COMMUNITY): Payer: Self-pay

## 2014-01-08 VITALS — BP 118/62 | HR 79 | Wt 167.8 lb

## 2014-01-08 DIAGNOSIS — Z7901 Long term (current) use of anticoagulants: Secondary | ICD-10-CM | POA: Diagnosis not present

## 2014-01-08 DIAGNOSIS — Z8249 Family history of ischemic heart disease and other diseases of the circulatory system: Secondary | ICD-10-CM | POA: Diagnosis not present

## 2014-01-08 DIAGNOSIS — D693 Immune thrombocytopenic purpura: Secondary | ICD-10-CM | POA: Insufficient documentation

## 2014-01-08 DIAGNOSIS — I509 Heart failure, unspecified: Secondary | ICD-10-CM | POA: Diagnosis not present

## 2014-01-08 DIAGNOSIS — I272 Pulmonary hypertension, unspecified: Secondary | ICD-10-CM

## 2014-01-08 DIAGNOSIS — I5032 Chronic diastolic (congestive) heart failure: Secondary | ICD-10-CM | POA: Insufficient documentation

## 2014-01-08 DIAGNOSIS — I2789 Other specified pulmonary heart diseases: Secondary | ICD-10-CM | POA: Diagnosis not present

## 2014-01-08 DIAGNOSIS — Z09 Encounter for follow-up examination after completed treatment for conditions other than malignant neoplasm: Secondary | ICD-10-CM | POA: Insufficient documentation

## 2014-01-08 DIAGNOSIS — I10 Essential (primary) hypertension: Secondary | ICD-10-CM | POA: Diagnosis not present

## 2014-01-08 DIAGNOSIS — I482 Chronic atrial fibrillation, unspecified: Secondary | ICD-10-CM

## 2014-01-08 DIAGNOSIS — I4891 Unspecified atrial fibrillation: Secondary | ICD-10-CM | POA: Diagnosis not present

## 2014-01-08 LAB — BASIC METABOLIC PANEL
ANION GAP: 11 (ref 5–15)
BUN: 15 mg/dL (ref 6–23)
CO2: 30 mEq/L (ref 19–32)
Calcium: 9.7 mg/dL (ref 8.4–10.5)
Chloride: 101 mEq/L (ref 96–112)
Creatinine, Ser: 0.93 mg/dL (ref 0.50–1.10)
GFR, EST AFRICAN AMERICAN: 69 mL/min — AB (ref >90)
GFR, EST NON AFRICAN AMERICAN: 60 mL/min — AB (ref >90)
Glucose, Bld: 89 mg/dL (ref 70–99)
POTASSIUM: 4 meq/L (ref 3.7–5.3)
SODIUM: 142 meq/L (ref 137–147)

## 2014-01-08 LAB — PRO B NATRIURETIC PEPTIDE: PRO B NATRI PEPTIDE: 1336 pg/mL — AB (ref 0–125)

## 2014-01-08 NOTE — Patient Instructions (Signed)
Labs today.  Your physician recommends that you schedule a follow-up appointment in: 3 months  Do the following things EVERYDAY: 1) Weigh yourself in the morning before breakfast. Write it down and keep it in a log. 2) Take your medicines as prescribed 3) Eat low salt foods-Limit salt (sodium) to 2000 mg per day.  4) Stay as active as you can everyday 5) Limit all fluids for the day to less than 2 liters 6)

## 2014-01-08 NOTE — Progress Notes (Signed)
Patient ID: Vicki [name], female   DOB: June 06, 1941, 73 y.o.   MRN: [id number] PCP: Dr. Wilson Singer  [name] is a 73 yo with history of chronic diastolic CHF and chronic atrial fibrillation presents for followup of pulmonary hypertension.  Patient was followed by Dr. Glade Lloyd in the past for chronic atrial fibrillation.  She has been on coumadin.  She reports progressive exertional dyspnea since 2011.  This gradually worsened and became quite significant over the last few months.  She used to have significant HTN, but more recently her BP has been on the lower side.  Echo was done in 2/14, showing severe concentric LVH with EF 55-60%, moderately dilated RV, moderate to severe TR, and PA systolic pressure 86 mmHg.  I did a right heart cath in 5/14.  This showed PA pressure 104/36 with PCWP 20, suggesting pulmonary arterial HTN well out of proportion to the mildly elevate wedge pressure.  She was already on amlodipine so I did not do vasodilator testing.  V/Q scan was done, showing no evidence for chronic PEs.  PFTs showed a restrictive defect. Cardiac MRI did not show definite evidence for amyloid.  I started her on macitentan 10 mg daily.  Initially, she felt better on macitentan.  However, she was admitted in 5/14 from her sleep study due to orthopnea and dyspnea.  She was diuresed for several days and diuretic was switched over to torsemide.  I next started her on tadalafil 20 mg daily and titrated up to 40 mg daily.  She thinks that this helped.  She saw pulmonary after chest CT (showed mosaic attenuation in lungs).  This was thought to be due to air-trapping rather than ILD.  She was started on Spiriva. She wears oxygen at home.   She had an echocardiogram in 2/15 that showed severe LVH, EF 75%, small pericardial effusion, mildly dilated RV with mildly decreased systolic function, PA systolic pressure 84 mmHg.  She is not totally sure if she was taking macitentan and tadalafil at that time.  She has had a hard  month.  She ran out of her macitentan and tadalafil and her insurance company refused to refill them.  After this, she took a decided turn for the worse.  [place] her [place] in [place] fall.  She became much more short of breath, just with walking around the house. She developed lightheaded spells, especially with micturation.  Given lightheadedness and presyncope, she was admitted last week.  She was restarted on her medications and she finally got back on her meds at home.  In the hospital, she was noted to be bradycardic with HR to 30s so metoprolol was stopped.  Of note, her abdominal fat pad biopsy did not suggest amyloidosis. Repeat RHC in 3/15 still with moderate to severe PAH and low CI.  Holter off beta blocker in 3/15 showed average HR 73.   She started Tyvaso earlier this year.  She is doing better overall and thinks Tyvaso has helped a lot.  She can walk in [place].  Very little dyspnea when walking on flat ground, which is improved. She is able to go up a gentle hill without much problem.  Weight is down 2 lbs.  Oxygen saturation was 88% but she was off her home oxygen.  No tachypalpitations, no lightheaded spells.    6 minute walk (5/14): 122 m.   6 minute walk (7/14): 152 m 6 minute walk (  10/14): 183 m 6 minute walk (4/15): 317 m  Labs (12/13): HCT 45.1, plts 153, K 3.5, creatinine 1.0 Labs (1/14): BNP 849 Labs (4/14): K 3.1, creatinine 1.0, ANA and anti-SCL-70 antibody negative.  Rheumatoid factor negative.  Serum immunofixation did not show monoclonal light chains.  Labs (5/14): K 3.3, creatinine 0.79, proBNP 5147 Labs (6/14): K 4, creatinine 0.9, BNP 1001 Labs (10/14): LDL 53, LDL-P 975, K 3.7, creatinine 1.2 Labs (11/14): K 3.7, creatinine 0.9, BNP 832 Labs (2/15): K 3.5, creatinine 1.10, HCT 42.6, UPEP negative, SPEP negative.  Labs (3/15): K 3.8, creatinine 0.88 Labs (4/15): K 3.7, creatinine 0.99, proBNP  1653  PMH: 1. Chronic diastolic CHF: Echo (8/29) with EF 55-60%, severe LVH (no SAM, no asymmetric hypertrophy, no LVOT gradient), moderate-severe LAE, moderately dilated RV with mildly decreased systolic function, moderate to severe RAE, PA systolic pressure 86 mmHg, moderate-severe TR, moderate MR, trivial pericardial effusion.  Cardiac MRI (5/14): EF 65%, severe LVH, no definite evidence for amyloidosis (no delayed enhancement, myocardium not difficult to null).  Echo (2/15) with EF 75%, severe LVH, grade II diastolic dysfunction, moderate MR, RV mildly dilated with mildly decreased systolic function, moderate TR, PA systolic pressure 84 mmHg, small pericardial effusion. Abdominal fat pad biopsy (2/15) showed no evidence for amyloidosis.  SPEP/UPEP negative 2/15.  2. Chronic atrial fibrillation since around 2004.  Developed bradycardia and metoprolol stopped 2/15. Holter (3/15) with average HR 73, atrial fibrillation, 3.8 sec pause x 1 while asleep, PVCs.  3. HTN: For decades.  4. LHC (2/08) with no significant disease.  5. Chronic thrombocytopenia: ITP 6. Pulmonary arterial HTN: RHC (5/14) with mean RA 13, PA 104/36 (mean 63), mean PCWP 20 on right and 23 on left, CI 2.3 (Fick) and 1.6 (thermo), PVR 10.4 WU (Fick) and 15 WU (thermo).  Vasodilator testing not done as patient was already on amlodipine.  V/Q scan (5/14) with no evidence for chronic PE.  ANA, RF, and anti-SCL70 antibody negative.  PFTs (5/14) with FEV1 60%, FVC 54%, ratio 112%, TLC 61%, DLCO 43% => restrictive defect.  Sleep study (7/14) with no OSA.  CT chest with areas of mosaic attenuation in lungs (saw pulmonary, thought air trapping and not ILD). RHC (3/15) with RA mean 5, PA 66/31 mean 43, PCWP mean 11, Cardiac Index (Fick) 1.97, PVR 9.2 WU.  Patient was started on Tyvaso in 3/15.    SH: Married, retired from a bank, 3 children, lives in [place].   FH: No heart disease, +HTN.  ROS: All systems reviewed and negative except as  per HPI.   Current Outpatient Prescriptions  Medication Sig Dispense Refill  . amLODipine (NORVASC) 2.5 MG tablet Take 1 tablet (2.5 mg total) by mouth daily.  30 tablet  1  . HYDROcodone-acetaminophen (NORCO/VICODIN) 5-325 MG per tablet 1 tablet every 6 (six) hours as needed for moderate pain.       . Macitentan (OPSUMIT) 10 MG TABS Take 1 tablet by mouth daily with breakfast.  30 tablet  3  . potassium chloride SA (KLOR-CON M20) 20 MEQ tablet TAKE TWO TABLETS BY MOUTH IN THE MORNING THEN ONE TABLET BY MOUTH IN THE EVENING  90 tablet  1  . rosuvastatin (CRESTOR) 20 MG tablet Take 20 mg by mouth at bedtime.       . Tadalafil, PAH, 20 MG TABS Take 40 mg by mouth daily.      Marland Kitchen torsemide (DEMADEX) 20 MG tablet TAKE ONE & ONE-HALF TABLETS BY MOUTH TWICE DAILY  90 tablet  3  . Treprostinil (TYVASO) 0.6 MG/ML SOLN Inhale 18 mcg into the lungs 4 (four) times daily.      Marland Kitchen warfarin (COUMADIN) 5 MG tablet Take 2.5-5 mg by mouth daily. Takes 1/2 tablet on Monday, Wednesday and Friday and 1 tablet all other days       No current facility-administered medications for this encounter.    BP 118/62  Pulse 79  Wt 167 lb 12.8 oz (76.114 kg)  SpO2 88% General: NAD Neck: JVP 7-8 cm, no thyromegaly or thyroid nodule.  Lungs: Slight dry crackles at bases bilaterally otherwise clear CV: Nondisplaced PMI.  Heart irregular S1/S2, 2/6 HSM LLSB.  Trace ankle edema.  No carotid bruit.  Normal pedal pulses.  Abdomen: Soft, nontender, no hepatosplenomegaly, no distention.  Neurologic: Alert and oriented x 3.  Psych: Normal affect. Extremities: No clubbing or cyanosis. Right foot in boot post-fracture.   Assessment/Plan: 1. Pulmonary HTN: Patient has severe pulmonary arterial HTN.  Last RHC in 3/15 showed CI 1.97, PVR 9.  It is possible that long-standing PCWP elevation from diastolic LV dysfunction could lead to pulmonary vascular changes with pulmonary arterial hypertension out of proportion to the PCWP  elevation.  However, I suspect idiopathic primary pulmonary HTN.  Collagen vascular disease workup was negative (negative RF, ANA and negative anti-SCL-70).  V/Q scan was not suggestive of chronic PEs.  PFTs were suggestive of restrictive lung disease but CT chest and evaluation by pulmonary did not suggest interstitial lung disease.  Sleep study did not show OSA. She has been on tadalafil and macitentan.  She recently started Tyvaso and is feeling considerably better.  I will see her back in 3 months with 6 minute walk. 2. Chronic diastolic CHF: EF preserved on echo with severe concentric LVH and a small pericardial effusion.  It is possible that the LVH is due to years of HTN.  The cardiac MRI was not definitively suggestive of cardiac amyloidosis.  I was still concerned for amyloidosis given the appearance of the LV myocardium on echoes.  SPEP/UPEP negative.  Abdominal fat pad biopsy showed no evidence for amyloidosis.  - Continue current torsemide for now, volume ok.  - BMET/BNP today.  3. HTN: BP stable.  Can continue current dose of amlodipine.  4. Chronic atrial fibrillation: Continue coumadin.  She is off metoprolol due to bradycardia. Holter off metoprolol did not show any high rate episodes.    [name] MD 01/08/2014

## 2014-01-18 ENCOUNTER — Ambulatory Visit (INDEPENDENT_AMBULATORY_CARE_PROVIDER_SITE_OTHER): Payer: Medicare Other | Admitting: *Deleted

## 2014-01-18 DIAGNOSIS — I4891 Unspecified atrial fibrillation: Secondary | ICD-10-CM

## 2014-01-18 DIAGNOSIS — Z5181 Encounter for therapeutic drug level monitoring: Secondary | ICD-10-CM

## 2014-01-18 LAB — POCT INR: INR: 3.2

## 2014-01-30 ENCOUNTER — Other Ambulatory Visit: Payer: Self-pay | Admitting: Cardiology

## 2014-02-01 ENCOUNTER — Ambulatory Visit (INDEPENDENT_AMBULATORY_CARE_PROVIDER_SITE_OTHER): Payer: Medicare Other

## 2014-02-01 DIAGNOSIS — I4891 Unspecified atrial fibrillation: Secondary | ICD-10-CM | POA: Diagnosis not present

## 2014-02-01 DIAGNOSIS — Z5181 Encounter for therapeutic drug level monitoring: Secondary | ICD-10-CM | POA: Diagnosis not present

## 2014-02-01 LAB — POCT INR: INR: 2.6

## 2014-02-22 ENCOUNTER — Ambulatory Visit (INDEPENDENT_AMBULATORY_CARE_PROVIDER_SITE_OTHER): Payer: Medicare Other | Admitting: Pharmacist

## 2014-02-22 DIAGNOSIS — I4891 Unspecified atrial fibrillation: Secondary | ICD-10-CM

## 2014-02-22 DIAGNOSIS — Z5181 Encounter for therapeutic drug level monitoring: Secondary | ICD-10-CM | POA: Diagnosis not present

## 2014-02-22 LAB — POCT INR: INR: 2.4

## 2014-03-01 ENCOUNTER — Other Ambulatory Visit (HOSPITAL_COMMUNITY): Payer: Self-pay

## 2014-03-01 MED ORDER — AMLODIPINE BESYLATE 2.5 MG PO TABS: ORAL_TABLET | ORAL | Status: DC

## 2014-03-01 MED ORDER — POTASSIUM CHLORIDE CRYS ER 20 MEQ PO TBCR: EXTENDED_RELEASE_TABLET | ORAL | Status: DC

## 2014-03-06 DIAGNOSIS — D235 Other benign neoplasm of skin of trunk: Secondary | ICD-10-CM | POA: Diagnosis not present

## 2014-03-06 DIAGNOSIS — C4449 Other specified malignant neoplasm of skin of scalp and neck: Secondary | ICD-10-CM | POA: Diagnosis not present

## 2014-03-22 ENCOUNTER — Ambulatory Visit (INDEPENDENT_AMBULATORY_CARE_PROVIDER_SITE_OTHER): Payer: Medicare Other | Admitting: Pharmacist

## 2014-03-22 DIAGNOSIS — Z5181 Encounter for therapeutic drug level monitoring: Secondary | ICD-10-CM

## 2014-03-22 DIAGNOSIS — I4891 Unspecified atrial fibrillation: Secondary | ICD-10-CM

## 2014-03-22 LAB — POCT INR: INR: 2.1

## 2014-03-27 DIAGNOSIS — D485 Neoplasm of uncertain behavior of skin: Secondary | ICD-10-CM | POA: Diagnosis not present

## 2014-04-04 DIAGNOSIS — E789 Disorder of lipoprotein metabolism, unspecified: Secondary | ICD-10-CM | POA: Diagnosis not present

## 2014-04-11 DIAGNOSIS — Z7901 Long term (current) use of anticoagulants: Secondary | ICD-10-CM | POA: Diagnosis not present

## 2014-04-11 DIAGNOSIS — Z23 Encounter for immunization: Secondary | ICD-10-CM | POA: Diagnosis not present

## 2014-04-11 DIAGNOSIS — E789 Disorder of lipoprotein metabolism, unspecified: Secondary | ICD-10-CM | POA: Diagnosis not present

## 2014-04-11 DIAGNOSIS — I289 Disease of pulmonary vessels, unspecified: Secondary | ICD-10-CM | POA: Diagnosis not present

## 2014-04-12 ENCOUNTER — Other Ambulatory Visit (HOSPITAL_COMMUNITY): Payer: Self-pay | Admitting: Cardiology

## 2014-04-13 ENCOUNTER — Other Ambulatory Visit (HOSPITAL_COMMUNITY): Payer: Self-pay

## 2014-04-13 MED ORDER — MACITENTAN 10 MG PO TABS: 1.0000 | ORAL_TABLET | Freq: Every day | ORAL | Status: DC

## 2014-04-16 ENCOUNTER — Other Ambulatory Visit: Payer: Self-pay

## 2014-04-16 MED ORDER — MACITENTAN 10 MG PO TABS: 1.0000 | ORAL_TABLET | Freq: Every day | ORAL | Status: DC

## 2014-04-17 ENCOUNTER — Other Ambulatory Visit (HOSPITAL_COMMUNITY): Payer: Self-pay | Admitting: *Deleted

## 2014-04-17 ENCOUNTER — Other Ambulatory Visit: Payer: Self-pay

## 2014-04-17 ENCOUNTER — Ambulatory Visit (INDEPENDENT_AMBULATORY_CARE_PROVIDER_SITE_OTHER): Payer: Medicare Other

## 2014-04-17 DIAGNOSIS — I4891 Unspecified atrial fibrillation: Secondary | ICD-10-CM | POA: Diagnosis not present

## 2014-04-17 DIAGNOSIS — I272 Pulmonary hypertension, unspecified: Secondary | ICD-10-CM

## 2014-04-17 DIAGNOSIS — Z5181 Encounter for therapeutic drug level monitoring: Secondary | ICD-10-CM | POA: Diagnosis not present

## 2014-04-17 LAB — POCT INR: INR: 2

## 2014-04-17 NOTE — Telephone Encounter (Signed)
[name] to verify receipt of Opsumit rx for pt, as she will be out of this medication on Friday and is concerned about getting refill in time.  Pharmacy verified receipt of rx and they will ship medication tomorrow.  Pharmacy states pt will need refill on Tadalafil, this rx only has 1 refill left on it's current rx.  Forwarding refill request to refill pool to prevent any interruption in future PAH medications.  Thanks

## 2014-04-19 MED ORDER — TADALAFIL (PAH) 20 MG PO TABS: 40.0000 mg | ORAL_TABLET | Freq: Every day | ORAL | Status: DC

## 2014-04-23 DIAGNOSIS — C4441 Basal cell carcinoma of skin of scalp and neck: Secondary | ICD-10-CM | POA: Diagnosis not present

## 2014-05-10 ENCOUNTER — Other Ambulatory Visit (HOSPITAL_COMMUNITY): Payer: Self-pay | Admitting: Cardiology

## 2014-05-10 DIAGNOSIS — I272 Pulmonary hypertension, unspecified: Secondary | ICD-10-CM

## 2014-05-10 MED ORDER — TADALAFIL (PAH) 20 MG PO TABS: 40.0000 mg | ORAL_TABLET | Freq: Every day | ORAL | Status: DC

## 2014-05-14 ENCOUNTER — Other Ambulatory Visit (HOSPITAL_COMMUNITY): Payer: Self-pay

## 2014-05-14 MED ORDER — TREPROSTINIL 0.6 MG/ML IN SOLN: 18.0000 ug | Freq: Four times a day (QID) | RESPIRATORY_TRACT | Status: DC

## 2014-05-29 ENCOUNTER — Ambulatory Visit (HOSPITAL_COMMUNITY)
Admission: RE | Admit: 2014-05-29 | Discharge: 2014-05-29 | Disposition: A | Discharge status: Home / Self Care | Payer: Medicare Other | Source: Ambulatory Visit | Attending: Internal Medicine | Admitting: Internal Medicine

## 2014-05-29 ENCOUNTER — Ambulatory Visit (INDEPENDENT_AMBULATORY_CARE_PROVIDER_SITE_OTHER): Payer: Medicare Other | Admitting: Pharmacist Clinician (PhC)/ Clinical Pharmacy Specialist

## 2014-05-29 VITALS — BP 146/71 | HR 87 | Resp 18 | Wt 169.5 lb

## 2014-05-29 DIAGNOSIS — D693 Immune thrombocytopenic purpura: Secondary | ICD-10-CM | POA: Insufficient documentation

## 2014-05-29 DIAGNOSIS — I270 Primary pulmonary hypertension: Secondary | ICD-10-CM

## 2014-05-29 DIAGNOSIS — I4819 Other persistent atrial fibrillation: Secondary | ICD-10-CM

## 2014-05-29 DIAGNOSIS — I272 Other secondary pulmonary hypertension: Secondary | ICD-10-CM | POA: Diagnosis not present

## 2014-05-29 DIAGNOSIS — I340 Nonrheumatic mitral (valve) insufficiency: Secondary | ICD-10-CM | POA: Diagnosis not present

## 2014-05-29 DIAGNOSIS — I10 Essential (primary) hypertension: Secondary | ICD-10-CM | POA: Insufficient documentation

## 2014-05-29 DIAGNOSIS — I482 Chronic atrial fibrillation: Secondary | ICD-10-CM | POA: Diagnosis not present

## 2014-05-29 DIAGNOSIS — Z7901 Long term (current) use of anticoagulants: Secondary | ICD-10-CM | POA: Diagnosis not present

## 2014-05-29 DIAGNOSIS — Z5181 Encounter for therapeutic drug level monitoring: Secondary | ICD-10-CM | POA: Diagnosis not present

## 2014-05-29 DIAGNOSIS — I481 Persistent atrial fibrillation: Secondary | ICD-10-CM

## 2014-05-29 DIAGNOSIS — Z79899 Other long term (current) drug therapy: Secondary | ICD-10-CM | POA: Insufficient documentation

## 2014-05-29 DIAGNOSIS — I5032 Chronic diastolic (congestive) heart failure: Secondary | ICD-10-CM | POA: Diagnosis not present

## 2014-05-29 DIAGNOSIS — I071 Rheumatic tricuspid insufficiency: Secondary | ICD-10-CM | POA: Insufficient documentation

## 2014-05-29 DIAGNOSIS — I4891 Unspecified atrial fibrillation: Secondary | ICD-10-CM | POA: Diagnosis not present

## 2014-05-29 DIAGNOSIS — I313 Pericardial effusion (noninflammatory): Secondary | ICD-10-CM | POA: Insufficient documentation

## 2014-05-29 DIAGNOSIS — E785 Hyperlipidemia, unspecified: Secondary | ICD-10-CM | POA: Insufficient documentation

## 2014-05-29 LAB — HEPATIC FUNCTION PANEL
ALT: 15 U/L (ref 0–35)
AST: 38 U/L — ABNORMAL HIGH (ref 0–37)
Albumin: 3.8 g/dL (ref 3.5–5.2)
Alkaline Phosphatase: 44 U/L (ref 39–117)
Bilirubin, Direct: <0.2 mg/dL (ref 0.0–0.3)
TOTAL PROTEIN: 7.7 g/dL (ref 6.0–8.3)
Total Bilirubin: 0.5 mg/dL (ref 0.3–1.2)

## 2014-05-29 LAB — BASIC METABOLIC PANEL
ANION GAP: 14 (ref 5–15)
BUN: 14 mg/dL (ref 6–23)
CALCIUM: 9.5 mg/dL (ref 8.4–10.5)
CO2: 26 mEq/L (ref 19–32)
Chloride: 102 mEq/L (ref 96–112)
Creatinine, Ser: 1.03 mg/dL (ref 0.50–1.10)
GFR, EST AFRICAN AMERICAN: 61 mL/min — AB (ref >90)
GFR, EST NON AFRICAN AMERICAN: 53 mL/min — AB (ref >90)
Glucose, Bld: 98 mg/dL (ref 70–99)
Potassium: 3.9 mEq/L (ref 3.7–5.3)
Sodium: 142 mEq/L (ref 137–147)

## 2014-05-29 LAB — LIPID PANEL
Cholesterol: 152 mg/dL (ref 0–200)
HDL: 58 mg/dL (ref >39)
LDL Cholesterol: 77 mg/dL (ref 0–99)
TRIGLYCERIDES: 85 mg/dL (ref <150)
Total CHOL/HDL Ratio: 2.6 RATIO
VLDL: 17 mg/dL (ref 0–40)

## 2014-05-29 LAB — POCT INR: INR: 2.2

## 2014-05-29 NOTE — Progress Notes (Signed)
Patient ID: Vicki [name], female   DOB: [birth year], 73 y.o.   MRN: [id number] PCP: Dr. Wilson Singer  Vicki [name] is a 73 yo with history of chronic diastolic CHF and chronic atrial fibrillation presents for followup of pulmonary hypertension.  Patient was followed by Dr. Glade Lloyd in the past for chronic atrial fibrillation.  She has been on coumadin.  She reports progressive exertional dyspnea since 2011.  This gradually worsened and became quite significant over the last few months.  She used to have significant HTN, but more recently her BP has been on the lower side.  Echo was done in 2/14, showing severe concentric LVH with EF 55-60%, moderately dilated RV, moderate to severe TR, and PA systolic pressure 86 mmHg.  I did a right heart cath in 5/14.  This showed PA pressure 104/36 with PCWP 20, suggesting pulmonary arterial HTN well out of proportion to the mildly elevate wedge pressure.  She was already on amlodipine so I did not do vasodilator testing.  V/Q scan was done, showing no evidence for chronic PEs.  PFTs showed a restrictive defect. Cardiac MRI did not show definite evidence for amyloid.  I started her on macitentan 10 mg daily.  Initially, she felt better on macitentan.  However, she was admitted in 5/14 from her sleep study due to orthopnea and dyspnea.  She was diuresed for several days and diuretic was switched over to torsemide.  I next started her on tadalafil 20 mg daily and titrated up to 40 mg daily.  She thinks that this helped.  She saw pulmonary after chest CT (showed mosaic attenuation in lungs).  This was thought to be due to air-trapping rather than ILD.  She was started on Spiriva. She wears oxygen at home.   She had an echocardiogram in 2/15 that showed severe LVH, EF 75%, small pericardial effusion, mildly dilated RV with mildly decreased systolic function, PA systolic pressure 84 mmHg.  She is not totally sure if she was taking macitentan and tadalafil at that time.  She has had a hard  month.  She ran out of her macitentan and tadalafil and her insurance company refused to refill them.  After this, she took a decided turn for the worse.  [place] in [place].  She became much more short of breath, just with walking around the house. She developed lightheaded spells, especially with micturation.  Given lightheadedness and presyncope, she was admitted last week.  She was restarted on her medications and she finally got back on her meds at home.  In the hospital, she was noted to be bradycardic with HR to 30s so metoprolol was stopped.  Of note, her abdominal fat pad biopsy did not suggest amyloidosis. Repeat RHC in 3/15 still with moderate to severe PAH and low CI.  Holter off beta blocker in 3/15 showed average HR 73.   She started Tyvaso earlier this year.  She is doing better overall and thinks Tyvaso has helped a lot.  She can walk in [place].  Very little dyspnea when walking on flat ground, which is improved. She is able to go up a gentle hill without much problem.  She was able to cook Thanksgiving dinner without any problems.  No chest pain, lightheadedness, syncope.  No orthopnea/PND.  She says that her oxygen saturation has been higher since starting Tyvaso.  Her oxygen saturation today was 91% on room air at  rest and 88% was the lowest saturation with 6 minute walk. She has not been using her home oxygen as much. She wants to know if she can stop sleeping with it.  SBP is high today but has been running in the 025K systolic at home.   6 minute walk (5/14): 122 m.   6 minute walk (7/14): 152 m 6 minute walk (10/14): 183 m 6 minute walk (4/15): 317 m 6 minute walk (12/15): 259 m  Labs (12/13): HCT 45.1, plts 153, K 3.5, creatinine 1.0 Labs (1/14): BNP 849 Labs (4/14): K 3.1, creatinine 1.0, ANA and anti-SCL-70 antibody negative.  Rheumatoid factor negative.  Serum immunofixation did not show monoclonal  light chains.  Labs (5/14): K 3.3, creatinine 0.79, proBNP 5147 Labs (6/14): K 4, creatinine 0.9, BNP 1001 Labs (10/14): LDL 53, LDL-P 975, K 3.7, creatinine 1.2 Labs (11/14): K 3.7, creatinine 0.9, BNP 832 Labs (2/15): K 3.5, creatinine 1.10, HCT 42.6, UPEP negative, SPEP negative.  Labs (3/15): K 3.8, creatinine 0.88 Labs (4/15): K 3.7, creatinine 0.99, proBNP 1653 Labs (7/15): K 4, creatinine 0.93  PMH: 1. Chronic diastolic CHF: Echo (2/70) with EF 55-60%, severe LVH (no SAM, no asymmetric hypertrophy, no LVOT gradient), moderate-severe LAE, moderately dilated RV with mildly decreased systolic function, moderate to severe RAE, PA systolic pressure 86 mmHg, moderate-severe TR, moderate MR, trivial pericardial effusion.  Cardiac MRI (5/14): EF 65%, severe LVH, no definite evidence for amyloidosis (no delayed enhancement, myocardium not difficult to null).  Echo (2/15) with EF 75%, severe LVH, grade II diastolic dysfunction, moderate MR, RV mildly dilated with mildly decreased systolic function, moderate TR, PA systolic pressure 84 mmHg, small pericardial effusion. Abdominal fat pad biopsy (2/15) showed no evidence for amyloidosis.  SPEP/UPEP negative 2/15.  2. Chronic atrial fibrillation since around 2004.  Developed bradycardia and metoprolol stopped 2/15. Holter (3/15) with average HR 73, atrial fibrillation, 3.8 sec pause x 1 while asleep, PVCs.  3. HTN: For decades.  4. LHC (2/08) with no significant disease.  5. Chronic thrombocytopenia: ITP 6. Pulmonary arterial HTN: RHC (5/14) with mean RA 13, PA 104/36 (mean 63), mean PCWP 20 on right and 23 on left, CI 2.3 (Fick) and 1.6 (thermo), PVR 10.4 WU (Fick) and 15 WU (thermo).  Vasodilator testing not done as patient was already on amlodipine.  V/Q scan (5/14) with no evidence for chronic PE.  ANA, RF, and anti-SCL70 antibody negative.  PFTs (5/14) with FEV1 60%, FVC 54%, ratio 112%, TLC 61%, DLCO 43% => restrictive defect.  Sleep study (7/14) with  no OSA.  CT chest with areas of mosaic attenuation in lungs (saw pulmonary, thought air trapping and not ILD). RHC (3/15) with RA mean 5, PA 66/31 mean 43, PCWP mean 11, Cardiac Index (Fick) 1.97, PVR 9.2 WU.  Patient was started on Tyvaso in 3/15.    SH: Married, retired from a bank, 3 children, lives in [place].   FH: No heart disease, +HTN.  ROS: All systems reviewed and negative except as per HPI.   Current Outpatient Prescriptions  Medication Sig Dispense Refill  . amLODipine (NORVASC) 2.5 MG tablet TAKE ONE TABLET BY MOUTH ONCE DAILY 30 tablet 3  . HYDROcodone-acetaminophen (NORCO/VICODIN) 5-325 MG per tablet 1 tablet every 6 (six) hours as needed for moderate pain.     . Macitentan (OPSUMIT) 10 MG TABS Take 1 tablet by mouth daily with breakfast. 30 tablet 6  . potassium chloride SA (KLOR-CON M20) 20 MEQ tablet TAKE TWO TABLETS  BY MOUTH IN THE MORNING AND THEN TAKE ONE TABLET BY MOUTH IN THE EVENING 90 tablet 3  . rosuvastatin (CRESTOR) 20 MG tablet Take 20 mg by mouth at bedtime.     . Tadalafil, PAH, 20 MG TABS Take 2 tablets (40 mg total) by mouth daily. 180 tablet 1  . torsemide (DEMADEX) 20 MG tablet TAKE ONE & ONE-HALF TABLETS BY MOUTH TWICE DAILY 90 tablet 3  . Treprostinil (TYVASO) 0.6 MG/ML SOLN Inhale 18 mcg into the lungs 4 (four) times daily. 3.6 mL 3  . warfarin (COUMADIN) 5 MG tablet Take 2.5-5 mg by mouth daily. Takes 1/2 tablet on Monday, Wednesday and Friday and 1 tablet all other days     No current facility-administered medications for this encounter.    BP 146/71 mmHg  Pulse 87  Resp 18  Wt 169 lb 8 oz (76.885 kg)  SpO2 90% General: NAD Neck: JVP 7 cm, no thyromegaly or thyroid nodule.  Lungs: Slight dry crackles at bases bilaterally otherwise clear CV: Nondisplaced PMI.  Heart irregular S1/S2, 2/6 HSM LLSB.  No edema.  No carotid bruit.  Normal pedal pulses.  Abdomen: Soft, nontender, no hepatosplenomegaly, no distention.  Neurologic: Alert and  oriented x 3.  Psych: Normal affect. Extremities: No clubbing or cyanosis.   Assessment/Plan: 1. Pulmonary HTN: Patient has severe pulmonary arterial HTN.  Last RHC in 3/15 showed CI 1.97, PVR 9.  It is possible that long-standing PCWP elevation from diastolic LV dysfunction could lead to pulmonary vascular changes with pulmonary arterial hypertension out of proportion to the PCWP elevation.  However, I suspect idiopathic primary pulmonary HTN.  Collagen vascular disease workup was negative (negative RF, ANA and negative anti-SCL-70).  V/Q scan was not suggestive of chronic PEs.  PFTs were suggestive of restrictive lung disease but CT chest and evaluation by pulmonary did not suggest interstitial lung disease.  Sleep study did not show OSA. She has been on tadalafil, macitentan, and Tyvaso.  Symptomatically, she continues to feel well.  Her 6 minute walk distance was not as far as last time, but she was also not wearing oxygen.  - Echo to assess RV and estimation of PA pressure in 2/16.  - Patient wants to stop nocturnal oxygen.  I will have her do overnight oximetry off oxygen to see if she still needs it (suspect that she will).  2. Chronic diastolic CHF: EF preserved on echo with severe concentric LVH and a small pericardial effusion.  It is possible that the LVH is due to years of HTN.  The cardiac MRI was not definitively suggestive of cardiac amyloidosis.  I was still concerned for amyloidosis given the appearance of the LV myocardium on echoes.  SPEP/UPEP negative.  Abdominal fat pad biopsy showed no evidence for amyloidosis.  - Continue current torsemide for now, volume ok.  - BMET today.  3. HTN: BP stable, SBP in 120s at home.  Can continue current dose of amlodipine.  4. Chronic atrial fibrillation: Continue coumadin.  She is off metoprolol due to bradycardia. Holter off metoprolol did not show any high rate episodes.   5. Hyperlipidemia: On Crestor, check lipids/LFTs today.   [name]  MD 05/29/2014

## 2014-05-29 NOTE — Patient Instructions (Addendum)
Labs today  Your physician has requested that you have an echocardiogram. Echocardiography is a painless test that uses sound waves to create images of your heart. It provides your doctor with information about the size and shape of your heart and how well your heart's chambers and valves are working. This procedure takes approximately one hour. There are no restrictions for this procedure.  Your physician recommends that you schedule a follow-up appointment in: 3 months with a echocardiogram  You have been referred to have a overnight oximetry test done.  Do the following things EVERYDAY: 1) Weigh yourself in the morning before breakfast. Write it down and keep it in a log. 2) Take your medicines as prescribed 3) Eat low salt foods-Limit salt (sodium) to 2000 mg per day.  4) Stay as active as you can everyday 5) Limit all fluids for the day to less than 2 liters 6)

## 2014-05-29 NOTE — Progress Notes (Signed)
6 Minute Walk  Start 91% on Room Air HR 105  Ambulated total of 854f, no rest breaks needed, brisk and steady gate and pace, tolerated very well, no shortness of breath noted. Lowest O2 Sat on Room Air during ambulation 88% Highest HR during ambulation 151 (very brief, maintained 130's majority of time)  End 88% Hr 131

## 2014-05-31 ENCOUNTER — Encounter (HOSPITAL_COMMUNITY): Payer: Self-pay | Admitting: *Deleted

## 2014-06-07 ENCOUNTER — Encounter (HOSPITAL_COMMUNITY): Payer: Self-pay | Admitting: Cardiology

## 2014-06-27 ENCOUNTER — Other Ambulatory Visit (HOSPITAL_COMMUNITY): Payer: Self-pay | Admitting: Internal Medicine

## 2014-07-02 ENCOUNTER — Telehealth (HOSPITAL_COMMUNITY): Payer: Self-pay | Admitting: Vascular Surgery

## 2014-07-02 DIAGNOSIS — I5022 Chronic systolic (congestive) heart failure: Secondary | ICD-10-CM

## 2014-07-02 NOTE — Telephone Encounter (Signed)
Refill Amlodipine , Klor-con, pt want to know when her overnight oximetry test is going to scheduled the order was put in on 05/29/14

## 2014-07-03 MED ORDER — POTASSIUM CHLORIDE CRYS ER 20 MEQ PO TBCR
EXTENDED_RELEASE_TABLET | ORAL | Status: DC
Start: 2014-07-03 — End: 2014-11-20

## 2014-07-03 MED ORDER — AMLODIPINE BESYLATE 2.5 MG PO TABS: 2.5000 mg | ORAL_TABLET | Freq: Every day | ORAL | Status: DC

## 2014-07-03 NOTE — Telephone Encounter (Signed)
Refills returned to pharmacy. Message sent to [facility] to check status of overnight oximetry order

## 2014-07-05 DIAGNOSIS — E789 Disorder of lipoprotein metabolism, unspecified: Secondary | ICD-10-CM | POA: Diagnosis not present

## 2014-07-10 ENCOUNTER — Ambulatory Visit (INDEPENDENT_AMBULATORY_CARE_PROVIDER_SITE_OTHER): Payer: Medicare Other | Admitting: Pharmacist

## 2014-07-10 DIAGNOSIS — I481 Persistent atrial fibrillation: Secondary | ICD-10-CM | POA: Diagnosis not present

## 2014-07-10 DIAGNOSIS — I4891 Unspecified atrial fibrillation: Secondary | ICD-10-CM

## 2014-07-10 DIAGNOSIS — Z5181 Encounter for therapeutic drug level monitoring: Secondary | ICD-10-CM | POA: Diagnosis not present

## 2014-07-10 DIAGNOSIS — I4819 Other persistent atrial fibrillation: Secondary | ICD-10-CM

## 2014-07-10 LAB — POCT INR: INR: 2

## 2014-07-12 DIAGNOSIS — I4891 Unspecified atrial fibrillation: Secondary | ICD-10-CM | POA: Diagnosis not present

## 2014-07-12 DIAGNOSIS — E789 Disorder of lipoprotein metabolism, unspecified: Secondary | ICD-10-CM | POA: Diagnosis not present

## 2014-07-12 DIAGNOSIS — I10 Essential (primary) hypertension: Secondary | ICD-10-CM | POA: Diagnosis not present

## 2014-08-15 ENCOUNTER — Telehealth (HOSPITAL_COMMUNITY): Payer: Self-pay | Admitting: Vascular Surgery

## 2014-08-15 MED ORDER — TORSEMIDE 20 MG PO TABS: 30.0000 mg | ORAL_TABLET | Freq: Two times a day (BID) | ORAL | Status: DC

## 2014-08-15 NOTE — Telephone Encounter (Signed)
Pt needs a new prescription Torsemide 20 mg

## 2014-08-16 DIAGNOSIS — H25813 Combined forms of age-related cataract, bilateral: Secondary | ICD-10-CM | POA: Diagnosis not present

## 2014-08-16 DIAGNOSIS — H3532 Exudative age-related macular degeneration: Secondary | ICD-10-CM | POA: Diagnosis not present

## 2014-08-16 DIAGNOSIS — H40013 Open angle with borderline findings, low risk, bilateral: Secondary | ICD-10-CM | POA: Diagnosis not present

## 2014-08-22 DIAGNOSIS — H35351 Cystoid macular degeneration, right eye: Secondary | ICD-10-CM | POA: Diagnosis not present

## 2014-08-22 DIAGNOSIS — H35071 Retinal telangiectasis, right eye: Secondary | ICD-10-CM | POA: Diagnosis not present

## 2014-08-29 ENCOUNTER — Ambulatory Visit (INDEPENDENT_AMBULATORY_CARE_PROVIDER_SITE_OTHER): Payer: Medicare Other | Admitting: Pharmacist

## 2014-08-29 DIAGNOSIS — I481 Persistent atrial fibrillation: Secondary | ICD-10-CM | POA: Diagnosis not present

## 2014-08-29 DIAGNOSIS — Z5181 Encounter for therapeutic drug level monitoring: Secondary | ICD-10-CM

## 2014-08-29 DIAGNOSIS — I4891 Unspecified atrial fibrillation: Secondary | ICD-10-CM | POA: Diagnosis not present

## 2014-08-29 DIAGNOSIS — I4819 Other persistent atrial fibrillation: Secondary | ICD-10-CM

## 2014-08-29 LAB — POCT INR: INR: 2.2

## 2014-09-05 DIAGNOSIS — H35071 Retinal telangiectasis, right eye: Secondary | ICD-10-CM | POA: Diagnosis not present

## 2014-09-20 DIAGNOSIS — H35071 Retinal telangiectasis, right eye: Secondary | ICD-10-CM | POA: Diagnosis not present

## 2014-09-20 DIAGNOSIS — H35043 Retinal micro-aneurysms, unspecified, bilateral: Secondary | ICD-10-CM | POA: Diagnosis not present

## 2014-09-20 DIAGNOSIS — H40003 Preglaucoma, unspecified, bilateral: Secondary | ICD-10-CM | POA: Diagnosis not present

## 2014-09-20 DIAGNOSIS — H35351 Cystoid macular degeneration, right eye: Secondary | ICD-10-CM | POA: Diagnosis not present

## 2014-10-04 IMAGING — CR DG CHEST 2V
2 series · 2 of 2 positions shown · non-contrast
Comparison: 08/10/2006

CLINICAL DATA: Chest pain, shortness of breath, weakness,
correlation with VQ scan

CHEST - 2 VIEW

[w chest pa]
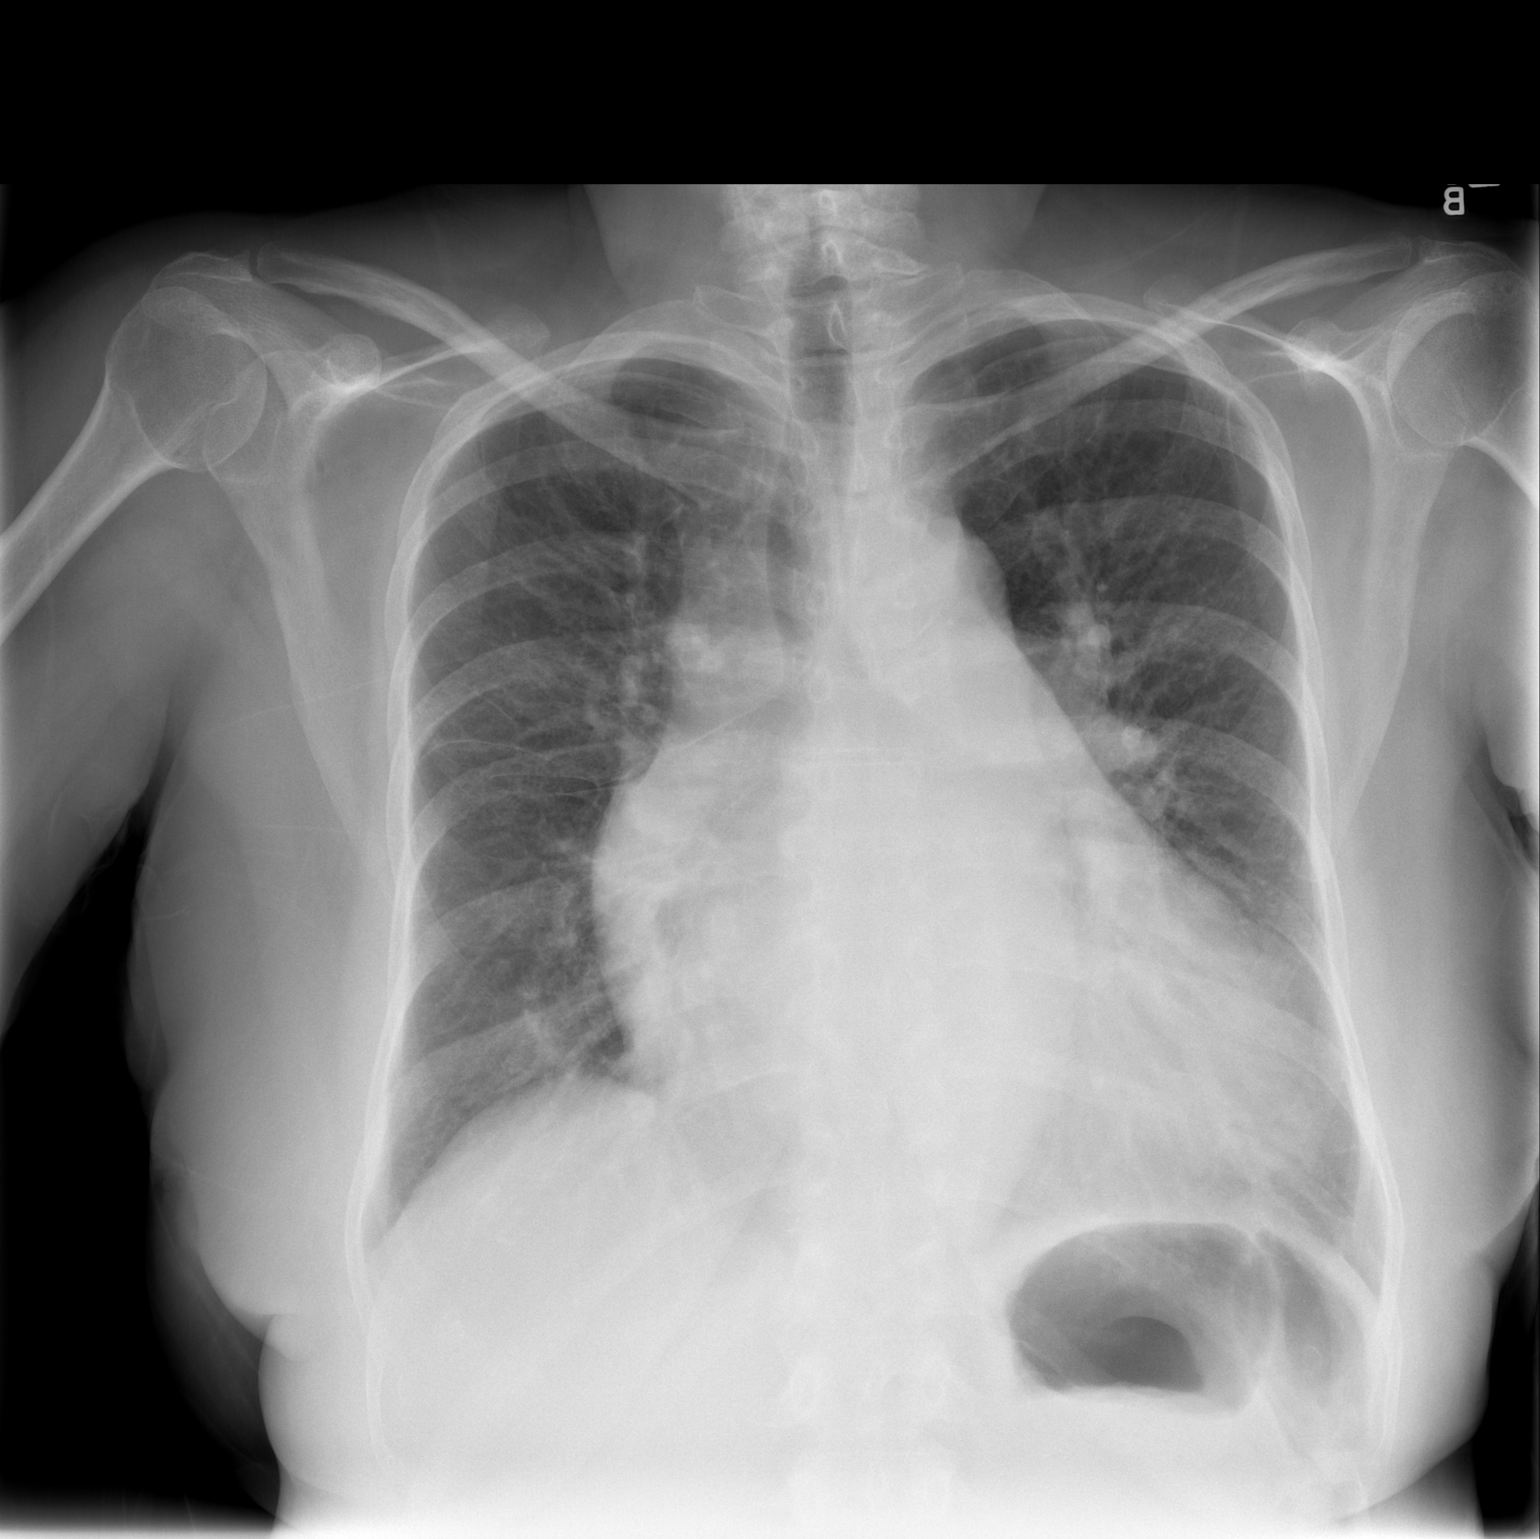

[w chest lat]
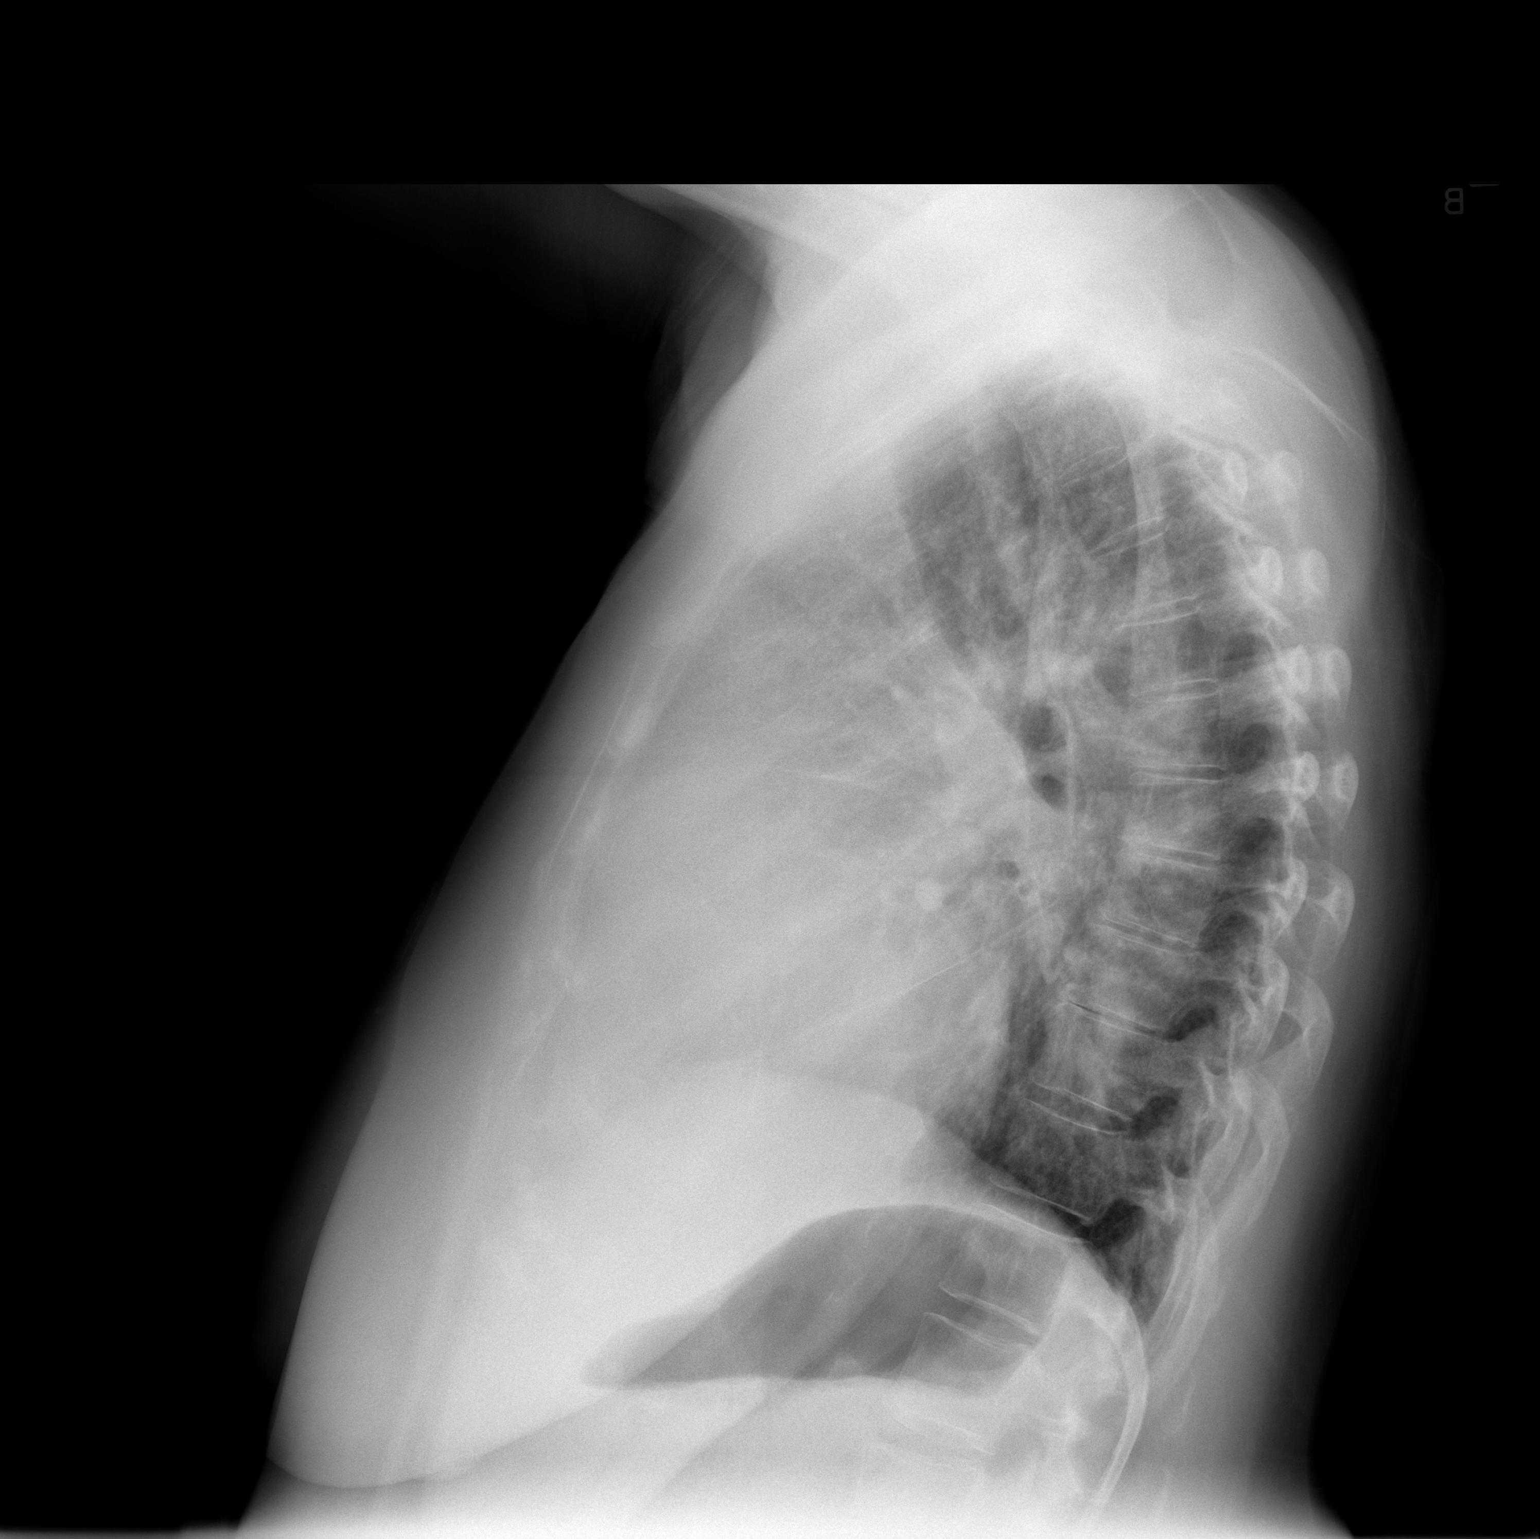

[2 of 2 positions shown; findings below may reference images not displayed]

FINDINGS: Enlargement of cardiac silhouette with pulmonary vascular
congestion.
Tortuous aorta.
No gross failure or segmental consolidation.
No pleural effusion or pneumothorax.
Bones unremarkable.
IMPRESSION: Enlargement of cardiac silhouette with pulmonary vascular
congestion.
No acute abnormalities.

## 2014-10-05 DIAGNOSIS — R0602 Shortness of breath: Secondary | ICD-10-CM | POA: Diagnosis not present

## 2014-10-05 DIAGNOSIS — I270 Primary pulmonary hypertension: Secondary | ICD-10-CM | POA: Diagnosis not present

## 2014-10-05 DIAGNOSIS — R0902 Hypoxemia: Secondary | ICD-10-CM | POA: Diagnosis not present

## 2014-10-10 ENCOUNTER — Ambulatory Visit (INDEPENDENT_AMBULATORY_CARE_PROVIDER_SITE_OTHER): Payer: Medicare Other | Admitting: *Deleted

## 2014-10-10 DIAGNOSIS — I481 Persistent atrial fibrillation: Secondary | ICD-10-CM | POA: Diagnosis not present

## 2014-10-10 DIAGNOSIS — I4891 Unspecified atrial fibrillation: Secondary | ICD-10-CM | POA: Diagnosis not present

## 2014-10-10 DIAGNOSIS — I4819 Other persistent atrial fibrillation: Secondary | ICD-10-CM

## 2014-10-10 DIAGNOSIS — Z5181 Encounter for therapeutic drug level monitoring: Secondary | ICD-10-CM

## 2014-10-10 LAB — POCT INR: INR: 2.3

## 2014-10-11 ENCOUNTER — Telehealth (HOSPITAL_COMMUNITY): Payer: Self-pay | Admitting: *Deleted

## 2014-10-11 NOTE — Telephone Encounter (Signed)
Received Overnight Oximetry Test pt had done on 4/8, pt has O2 dats <89% for 4 hours, per Dr Aundra Dubin pt needs to continue to wear oxygen at night, have attempted to call pt and Left message to call back

## 2014-10-12 NOTE — Telephone Encounter (Signed)
Spoke w/pt regarding results, she is agreeable to wearing her O2 at night

## 2014-10-19 ENCOUNTER — Encounter (HOSPITAL_COMMUNITY): Payer: Self-pay

## 2014-10-19 ENCOUNTER — Ambulatory Visit (HOSPITAL_COMMUNITY)
Admission: RE | Admit: 2014-10-19 | Discharge: 2014-10-19 | Disposition: A | Discharge status: Home / Self Care | Payer: Medicare Other | Source: Ambulatory Visit | Attending: Internal Medicine | Admitting: Internal Medicine

## 2014-10-19 ENCOUNTER — Ambulatory Visit (HOSPITAL_BASED_OUTPATIENT_CLINIC_OR_DEPARTMENT_OTHER)
Admission: RE | Admit: 2014-10-19 | Discharge: 2014-10-19 | Disposition: A | Discharge status: Home / Self Care | Payer: Medicare Other | Source: Ambulatory Visit | Attending: Cardiology | Admitting: Cardiology

## 2014-10-19 VITALS — BP 110/62 | HR 61 | Wt 168.8 lb

## 2014-10-19 DIAGNOSIS — I272 Other secondary pulmonary hypertension: Secondary | ICD-10-CM | POA: Diagnosis not present

## 2014-10-19 DIAGNOSIS — I482 Chronic atrial fibrillation, unspecified: Secondary | ICD-10-CM

## 2014-10-19 DIAGNOSIS — Z79899 Other long term (current) drug therapy: Secondary | ICD-10-CM | POA: Insufficient documentation

## 2014-10-19 DIAGNOSIS — I10 Essential (primary) hypertension: Secondary | ICD-10-CM | POA: Insufficient documentation

## 2014-10-19 DIAGNOSIS — D693 Immune thrombocytopenic purpura: Secondary | ICD-10-CM | POA: Insufficient documentation

## 2014-10-19 DIAGNOSIS — Z7901 Long term (current) use of anticoagulants: Secondary | ICD-10-CM | POA: Diagnosis not present

## 2014-10-19 DIAGNOSIS — I5032 Chronic diastolic (congestive) heart failure: Secondary | ICD-10-CM | POA: Diagnosis not present

## 2014-10-19 DIAGNOSIS — I270 Primary pulmonary hypertension: Secondary | ICD-10-CM | POA: Diagnosis not present

## 2014-10-19 DIAGNOSIS — E785 Hyperlipidemia, unspecified: Secondary | ICD-10-CM | POA: Insufficient documentation

## 2014-10-19 LAB — CBC
HCT: 42.3 % (ref 36.0–46.0)
Hemoglobin: 13.7 g/dL (ref 12.0–15.0)
MCH: 31.1 pg (ref 26.0–34.0)
MCHC: 32.4 g/dL (ref 30.0–36.0)
MCV: 96.1 fL (ref 78.0–100.0)
Platelets: 105 10*3/uL — ABNORMAL LOW (ref 150–400)
RBC: 4.4 MIL/uL (ref 3.87–5.11)
RDW: 14.9 % (ref 11.5–15.5)
WBC: 5 10*3/uL (ref 4.0–10.5)

## 2014-10-19 LAB — BRAIN NATRIURETIC PEPTIDE: B Natriuretic Peptide: 474.7 pg/mL — ABNORMAL HIGH (ref 0.0–100.0)

## 2014-10-19 LAB — BASIC METABOLIC PANEL
Anion gap: 6 (ref 5–15)
BUN: 17 mg/dL (ref 6–23)
CO2: 31 mmol/L (ref 19–32)
Calcium: 9.2 mg/dL (ref 8.4–10.5)
Chloride: 103 mmol/L (ref 96–112)
Creatinine, Ser: 0.93 mg/dL (ref 0.50–1.10)
GFR calc Af Amer: 69 mL/min — ABNORMAL LOW (ref >90)
GFR calc non Af Amer: 60 mL/min — ABNORMAL LOW (ref >90)
Glucose, Bld: 99 mg/dL (ref 70–99)
POTASSIUM: 3.8 mmol/L (ref 3.5–5.1)
Sodium: 140 mmol/L (ref 135–145)

## 2014-10-19 NOTE — Progress Notes (Signed)
Patient ID: Vicki [name], female   DOB: 04-20-1941, 74 y.o.   MRN: [id number] PCP: Dr. Wilson Singer  Vicki [name] is a 74 yo with history of chronic diastolic CHF and chronic atrial fibrillation presents for followup of pulmonary hypertension.  Patient was followed by Dr. Glade Lloyd in the past for chronic atrial fibrillation.  She has been on coumadin.  She reports progressive exertional dyspnea since 2011.  This gradually worsened and became quite significant over the last few months.  She used to have significant HTN, but more recently her BP has been on the lower side.  Echo was done in 2/14, showing severe concentric LVH with EF 55-60%, moderately dilated RV, moderate to severe TR, and PA systolic pressure 86 mmHg.  I did a right heart cath in 5/14.  This showed PA pressure 104/36 with PCWP 20, suggesting pulmonary arterial HTN well out of proportion to the mildly elevate wedge pressure.  She was already on amlodipine so I did not do vasodilator testing.  V/Q scan was done, showing no evidence for chronic PEs.  PFTs showed a restrictive defect. Cardiac MRI did not show definite evidence for amyloid.  I started her on macitentan 10 mg daily.  Initially, she felt better on macitentan.  However, she was admitted in 5/14 from her sleep study due to orthopnea and dyspnea.  She was diuresed for several days and diuretic was switched over to torsemide.  I next started her on tadalafil 20 mg daily and titrated up to 40 mg daily.  She thinks that this helped.  She saw pulmonary after chest CT (showed mosaic attenuation in lungs).  This was thought to be due to air-trapping rather than ILD.  She was started on Spiriva. She wears oxygen at home.   She had an echocardiogram in 2/15 that showed severe LVH, EF 75%, small pericardial effusion, mildly dilated RV with mildly decreased systolic function, PA systolic pressure 84 mmHg.  She is not totally sure if she was taking macitentan and tadalafil at that time.  She has had a hard  month.  She ran out of her macitentan and tadalafil and her insurance company refused to refill them.  After this, she took a decided turn for the worse.  [place] her [place] in [place] fall.  She became much more short of breath, just with walking around the house. She developed lightheaded spells, especially with micturation.  Given lightheadedness and presyncope, she was admitted last week.  She was restarted on her medications and she finally got back on her meds at home.  In the hospital, she was noted to be bradycardic with HR to 30s so metoprolol was stopped.  Of note, her abdominal fat pad biopsy did not suggest amyloidosis. Repeat RHC in 3/15 still with moderate to severe PAH and low CI.  Holter off beta blocker in 3/15 showed average HR 73.   She is now on Tyvaso, tadalafil, and macitentan for pulmonary hypertension.  Echo today showed normal LV EF, severe LVH, mildly dilated and mildly dysfunctional RV, and PA systolic pressure 40 mmHg.  No chest pain, lightheadedness, syncope.  No orthopnea/PND.  Breathing is ok, she can walk up a flight of steps and can walk on flat ground without dyspnea.  She cooks and cleans and does some yardwork.  She is short of breath with vacuuming. She is wearing oxygen at night but not much during the day now.  6 minute walk (5/14): 122 m.   6 minute walk (7/14): 152 m 6 minute walk (10/14): 183 m 6 minute walk (4/15): 317 m 6 minute walk (12/15): 259 m 6 minute walk (4/16): 293 m  Labs (12/13): HCT 45.1, plts 153, K 3.5, creatinine 1.0 Labs (1/14): BNP 849 Labs (4/14): K 3.1, creatinine 1.0, ANA and anti-SCL-70 antibody negative.  Rheumatoid factor negative.  Serum immunofixation did not show monoclonal light chains.  Labs (5/14): K 3.3, creatinine 0.79, proBNP 5147 Labs (6/14): K 4, creatinine 0.9, BNP 1001 Labs (10/14): LDL 53, LDL-P 975, K 3.7, creatinine 1.2 Labs (11/14): K 3.7, creatinine 0.9,  BNP 832 Labs (2/15): K 3.5, creatinine 1.10, HCT 42.6, UPEP negative, SPEP negative.  Labs (3/15): K 3.8, creatinine 0.88 Labs (4/15): K 3.7, creatinine 0.99, proBNP 1653 Labs (7/15): K 4, creatinine 0.93 Labs (12/15): LDL 77, HDL 58, K 3.9, creatinine 1.03, LFTs normal  PMH: 1. Chronic diastolic CHF: Echo (0/76) with EF 55-60%, severe LVH (no SAM, no asymmetric hypertrophy, no LVOT gradient), moderate-severe LAE, moderately dilated RV with mildly decreased systolic function, moderate to severe RAE, PA systolic pressure 86 mmHg, moderate-severe TR, moderate MR, trivial pericardial effusion.  Cardiac MRI (5/14): EF 65%, severe LVH, no definite evidence for amyloidosis (no delayed enhancement, myocardium not difficult to null).  Echo (2/15) with EF 75%, severe LVH, grade II diastolic dysfunction, moderate MR, RV mildly dilated with mildly decreased systolic function, moderate TR, PA systolic pressure 84 mmHg, small pericardial effusion. Abdominal fat pad biopsy (2/15) showed no evidence for amyloidosis.  SPEP/UPEP negative 2/15. Echo (4/16) with EF 60-65%, severe LVH, RV mildly dilated with mildly decreased systolic function, PA systolic pressure 40 mmHg.  2. Chronic atrial fibrillation since around 2004.  Developed bradycardia and metoprolol stopped 2/15. Holter (3/15) with average HR 73, atrial fibrillation, 3.8 sec pause x 1 while asleep, PVCs.  3. HTN: For decades.  4. LHC (2/08) with no significant disease.  5. Chronic thrombocytopenia: ITP 6. Pulmonary arterial HTN: RHC (5/14) with mean RA 13, PA 104/36 (mean 63), mean PCWP 20 on right and 23 on left, CI 2.3 (Fick) and 1.6 (thermo), PVR 10.4 WU (Fick) and 15 WU (thermo).  Vasodilator testing not done as patient was already on amlodipine.  V/Q scan (5/14) with no evidence for chronic PE.  ANA, RF, and anti-SCL70 antibody negative.  PFTs (5/14) with FEV1 60%, FVC 54%, ratio 112%, TLC 61%, DLCO 43% => restrictive defect.  Sleep study (7/14) with no  OSA.  CT chest with areas of mosaic attenuation in lungs (saw pulmonary, thought air trapping and not ILD). RHC (3/15) with RA mean 5, PA 66/31 mean 43, PCWP mean 11, Cardiac Index (Fick) 1.97, PVR 9.2 WU.  Patient was started on Tyvaso in 3/15.  Echo (4/16) with mildly dilated and mildly dysfunctional RV, PA systolic pressure 40 mmHg.   SH: Married, retired from a bank, 3 children, lives in [place].   FH: No heart disease, +HTN.  ROS: All systems reviewed and negative except as per HPI.   Current Outpatient Prescriptions  Medication Sig Dispense Refill  . amLODipine (NORVASC) 2.5 MG tablet Take 1 tablet (2.5 mg total) by mouth daily. 30 tablet 3  . Macitentan (OPSUMIT) 10 MG TABS Take 1 tablet by mouth daily with breakfast. 30 tablet 6  . potassium chloride SA (KLOR-CON M20) 20 MEQ tablet TAKE TWO TABLETS BY MOUTH IN THE MORNING AND ONE IN THE EVENING 90 tablet 3  . rosuvastatin (CRESTOR)  20 MG tablet Take 20 mg by mouth at bedtime.     . Tadalafil, PAH, 20 MG TABS Take 2 tablets (40 mg total) by mouth daily. 180 tablet 1  . torsemide (DEMADEX) 20 MG tablet Take 1.5 tablets (30 mg total) by mouth 2 (two) times daily. 90 tablet 3  . Treprostinil (TYVASO) 0.6 MG/ML SOLN Inhale 18 mcg into the lungs 4 (four) times daily. 3.6 mL 3  . warfarin (COUMADIN) 5 MG tablet Take 2.5-5 mg by mouth daily. Takes 1/2 tablet on Monday, Wednesday and Friday and 1 tablet all other days     No current facility-administered medications for this encounter.    BP 110/62 mmHg  Pulse 61  Wt 168 lb 12 oz (76.544 kg)  SpO2 95% General: NAD Neck: JVP 7 cm, no thyromegaly or thyroid nodule.  Lungs: Slight dry crackles at bases bilaterally otherwise clear CV: Nondisplaced PMI.  Heart irregular S1/S2, 2/6 HSM apex.  No edema.  No carotid bruit.  Normal pedal pulses.  Abdomen: Soft, nontender, no hepatosplenomegaly, no distention.  Neurologic: Alert and oriented x 3.  Psych: Normal affect. Extremities: No  clubbing or cyanosis.   Assessment/Plan: 1. Pulmonary HTN: Patient has severe pulmonary arterial HTN.  Last RHC in 3/15 showed CI 1.97, PVR 9.  It is possible that long-standing PCWP elevation from diastolic LV dysfunction could lead to pulmonary vascular changes with pulmonary arterial hypertension out of proportion to the PCWP elevation.  However, I suspect idiopathic primary pulmonary HTN.  Collagen vascular disease workup was negative (negative RF, ANA and negative anti-SCL-70).  V/Q scan was not suggestive of chronic PEs.  PFTs were suggestive of restrictive lung disease but CT chest and evaluation by pulmonary did not suggest interstitial lung disease.  Sleep study did not show OSA. She has been on tadalafil, macitentan, and Tyvaso.  Symptomatically, she continues to feel well.  6 minute walk today is improved compared to prior. Echo today showed mildly dysfunctional RV with PA systolic pressure 43 mmHg.    - I think that she needs to continue oxygen at night and with exertion (dropped to 89% on 2 L today during 6 minute walk).  - Continue Tyvaso, macitentan, Adcirca.    2. Chronic diastolic CHF: EF preserved on echo with severe concentric LVH and a small pericardial effusion.  It is possible that the LVH is due to years of HTN.  The cardiac MRI was not definitively suggestive of cardiac amyloidosis.  I was still concerned for amyloidosis given the appearance of the LV myocardium on echoes.  SPEP/UPEP negative.  Abdominal fat pad biopsy showed no evidence for amyloidosis.  - Continue current torsemide for now, volume ok. Check BMET/BNP today.  3. HTN: BP stable, SBP in 120s at home.  Can continue current dose of amlodipine.  4. Chronic atrial fibrillation: Continue coumadin.  She is off metoprolol due to bradycardia. Holter off metoprolol did not show any high rate episodes.   5. Hyperlipidemia: On Crestor, good lipids 12/15.   Followup in 3 months.    [name] MD 10/19/2014

## 2014-10-19 NOTE — Patient Instructions (Signed)
Follow up 3 months.  Do the following things EVERYDAY: 1) Weigh yourself in the morning before breakfast. Write it down and keep it in a log. 2) Take your medicines as prescribed 3) Eat low salt foods-Limit salt (sodium) to 2000 mg per day.  4) Stay as active as you can everyday 5) Limit all fluids for the day to less than 2 liters

## 2014-10-19 NOTE — Progress Notes (Signed)
6 minute Walk  Start 4L intermittent O2 via [place] on portable tank 95% 80 HR  Patient ambulated total of 960 ft. (292.608 meters), steady gate and pace, no rest breaks or shortness of breath. Briefly desat to 89% but quickly came back to 91%, HR max 118 briefly, maintained 100-110's during walk. Patient states feeling great after walk.  End 93% HR 94  Dr. [name] made aware of results.

## 2014-10-19 NOTE — Addendum Note (Signed)
Encounter addended by: Larey Dresser, MD on: 10/19/2014  4:33 PM<BR>     Documentation filed: Problem List, Visit Diagnoses, Follow-up Section, LOS Section, Notes Section

## 2014-11-01 DIAGNOSIS — H35011 Changes in retinal vascular appearance, right eye: Secondary | ICD-10-CM | POA: Diagnosis not present

## 2014-11-01 DIAGNOSIS — H35351 Cystoid macular degeneration, right eye: Secondary | ICD-10-CM | POA: Diagnosis not present

## 2014-11-01 DIAGNOSIS — H35071 Retinal telangiectasis, right eye: Secondary | ICD-10-CM | POA: Diagnosis not present

## 2014-11-06 ENCOUNTER — Other Ambulatory Visit (HOSPITAL_COMMUNITY): Payer: Self-pay | Admitting: *Deleted

## 2014-11-06 MED ORDER — MACITENTAN 10 MG PO TABS: 1.0000 | ORAL_TABLET | Freq: Every day | ORAL | Status: DC

## 2014-11-08 DIAGNOSIS — I4891 Unspecified atrial fibrillation: Secondary | ICD-10-CM | POA: Diagnosis not present

## 2014-11-08 DIAGNOSIS — Z7901 Long term (current) use of anticoagulants: Secondary | ICD-10-CM | POA: Diagnosis not present

## 2014-11-08 DIAGNOSIS — E789 Disorder of lipoprotein metabolism, unspecified: Secondary | ICD-10-CM | POA: Diagnosis not present

## 2014-11-08 DIAGNOSIS — N390 Urinary tract infection, site not specified: Secondary | ICD-10-CM | POA: Diagnosis not present

## 2014-11-08 DIAGNOSIS — I10 Essential (primary) hypertension: Secondary | ICD-10-CM | POA: Diagnosis not present

## 2014-11-15 ENCOUNTER — Telehealth (HOSPITAL_COMMUNITY): Payer: Self-pay

## 2014-11-15 NOTE — Telephone Encounter (Signed)
Spoke with patient last week concerning a flight she will be taking with [employer].  Patient will require O2 on flight per Dr. Aundra Dubin.  Form printed from [employer] website regarding O2 order and requirements and filled and signed today by Dr. Aundra Dubin.  Patient aware to come retrieve form to fill out her section required on form.  Patient also made aware that she will require an oxygen concentrator, phone number for [employer] given to patient to call to inquire on how to rent a concentrator through them for her trip.  Will keep form in our clinic with disability and form files for patient pickup.  Renee Pain

## 2014-11-20 ENCOUNTER — Other Ambulatory Visit (HOSPITAL_COMMUNITY): Payer: Self-pay | Admitting: Internal Medicine

## 2014-11-20 ENCOUNTER — Encounter: Payer: Self-pay | Admitting: Cardiology

## 2014-11-21 ENCOUNTER — Ambulatory Visit (INDEPENDENT_AMBULATORY_CARE_PROVIDER_SITE_OTHER): Payer: Medicare Other

## 2014-11-21 DIAGNOSIS — Z5181 Encounter for therapeutic drug level monitoring: Secondary | ICD-10-CM

## 2014-11-21 DIAGNOSIS — I482 Chronic atrial fibrillation, unspecified: Secondary | ICD-10-CM

## 2014-11-21 DIAGNOSIS — I4891 Unspecified atrial fibrillation: Secondary | ICD-10-CM

## 2014-11-21 LAB — POCT INR: INR: 2.8

## 2014-11-28 DIAGNOSIS — N390 Urinary tract infection, site not specified: Secondary | ICD-10-CM | POA: Diagnosis not present

## 2014-11-28 DIAGNOSIS — Z89029 Acquired absence of unspecified finger(s): Secondary | ICD-10-CM | POA: Diagnosis not present

## 2014-11-28 DIAGNOSIS — I482 Chronic atrial fibrillation: Secondary | ICD-10-CM | POA: Diagnosis not present

## 2014-12-04 ENCOUNTER — Telehealth: Payer: Self-pay | Admitting: Hematology

## 2014-12-04 ENCOUNTER — Telehealth: Payer: Self-pay | Admitting: Oncology

## 2014-12-04 NOTE — Telephone Encounter (Signed)
Called pt and left message regarding new pt appt.   Dx: Thrombocytopenia Referring: Dr. Wilson Singer

## 2014-12-04 NOTE — Telephone Encounter (Signed)
Pt called back and scheduled new pt appt  Shadad 01/01/15 1:30 pm

## 2014-12-05 ENCOUNTER — Other Ambulatory Visit (HOSPITAL_COMMUNITY): Payer: Self-pay | Admitting: Internal Medicine

## 2014-12-07 ENCOUNTER — Other Ambulatory Visit (HOSPITAL_COMMUNITY): Payer: Self-pay | Admitting: *Deleted

## 2014-12-13 ENCOUNTER — Other Ambulatory Visit (HOSPITAL_COMMUNITY): Payer: Self-pay | Admitting: *Deleted

## 2014-12-14 ENCOUNTER — Other Ambulatory Visit (HOSPITAL_COMMUNITY): Payer: Self-pay | Admitting: *Deleted

## 2014-12-14 DIAGNOSIS — I272 Pulmonary hypertension, unspecified: Secondary | ICD-10-CM

## 2014-12-14 MED ORDER — TADALAFIL (PAH) 20 MG PO TABS: 40.0000 mg | ORAL_TABLET | Freq: Every day | ORAL | Status: DC

## 2014-12-27 ENCOUNTER — Other Ambulatory Visit (HOSPITAL_COMMUNITY): Payer: Self-pay | Admitting: Internal Medicine

## 2014-12-28 DIAGNOSIS — D696 Thrombocytopenia, unspecified: Secondary | ICD-10-CM

## 2014-12-28 HISTORY — DX: Thrombocytopenia, unspecified: D69.6

## 2015-01-01 ENCOUNTER — Encounter: Payer: Self-pay | Admitting: Oncology

## 2015-01-01 ENCOUNTER — Telehealth: Payer: Self-pay | Admitting: Oncology

## 2015-01-01 ENCOUNTER — Ambulatory Visit: Payer: Medicare Other

## 2015-01-01 ENCOUNTER — Encounter (INDEPENDENT_AMBULATORY_CARE_PROVIDER_SITE_OTHER): Payer: Self-pay

## 2015-01-01 ENCOUNTER — Other Ambulatory Visit: Payer: Medicare Other

## 2015-01-01 ENCOUNTER — Ambulatory Visit (HOSPITAL_BASED_OUTPATIENT_CLINIC_OR_DEPARTMENT_OTHER): Payer: Medicare Other | Admitting: Oncology

## 2015-01-01 VITALS — BP 126/68 | HR 62 | Temp 99.2°F | Resp 18 | Ht 62.5 in | Wt 169.0 lb

## 2015-01-01 DIAGNOSIS — I272 Other secondary pulmonary hypertension: Secondary | ICD-10-CM

## 2015-01-01 DIAGNOSIS — D696 Thrombocytopenia, unspecified: Secondary | ICD-10-CM

## 2015-01-01 DIAGNOSIS — I509 Heart failure, unspecified: Secondary | ICD-10-CM | POA: Diagnosis not present

## 2015-01-01 NOTE — Telephone Encounter (Signed)
Gave and pirnted appt sched and avs for pt for NOV

## 2015-01-01 NOTE — Progress Notes (Signed)
Please see consult note.

## 2015-01-01 NOTE — Consult Note (Signed)
Reason for Referral: Thrombocytopenia.   HPI: 74 year old woman native of [place] but have been living in this area for over 50 years. She is a pleasant woman with history of pulmonary hypertension and congestive heart failure that is fairly well compensated. Her symptoms have been well managed and continues to be very functional woman. She is able to live independently although her son stays with her occasionally. He is able to drive and attends to activities of daily living. She is noted most recently to have thrombocytopenia with a platelet count of 97. Her white cell count was 3.3 with a normal hemoglobin. A repeat CBC on 11/28/2014. White cell count of 4.4, hemoglobin of 12.5 and a platelet count of 92. Looking back at the previous blood counts her platelet count has fluctuated since 2013. Her platelet count have been as low as 64,000 with platelet clumping noted. Her count was up to 105 and subsequently to 112. She does have a normal hemoglobin and normal white cell count. She did have an evaluation for amyloidosis given her nonischemic cardiomyopathy and did not show any evidence of amyloidosis by biopsy or protein studies. Patient is completely asymptomatic at this point without any bleeding or bruising. She does not report any hematochezia or GI bleed. She does not report any hematuria or epistaxis.  She does not report any headaches, blurry vision, syncope or seizures. She does not report any fevers, chills, sweats or weight loss. She does not report any chest pain, palpitation orthopnea. She does not report any cough or hemoptysis or hematemesis. Does not report any nausea, vomiting, abdominal pain. She does not report any change in her bowel habits. She does not report any frequency urgency or hesitancy. She does not report any skeletal complaints. Remaining review of systems unremarkable.   Past Medical History  Diagnosis Date  . Atrial fibrillation   . CHF (congestive heart failure)   .  Sleep apnea   . Pulmonary hypertension   :  Past Surgical History  Procedure Laterality Date  . Vaginal hysterectomy    . Breast lumpectomy      benign  . Doppler echocardiography  Aug 09 2012    severe LVH  with mild cavity obliteration. EF 55-60% without normal relaxationbut difficult to really tell total diastolic function due to a fib; sclerotic aortic valve with no stenosis. mod mitral regurg. mod to severely dilated left atrium; mod dilated rgt right ventricle w/elevated pressures, estimated peak PA presures _0  TR jet calculation. RGT ATRIUM MOD to SEVERE,    . Nm myocar perf wall motion  2004    EXERCISE  ,no ischemia  . Cardiac catheterization  08/12/2006    no significant disease  . Cardiac catheterization  10/28/2012    right heart cath done  . Right heart catheterization N/A 09/07/2013    Procedure: RIGHT HEART CATH;  Surgeon: Larey Dresser, MD;  Location: [facility] CATH LAB;  Service: Cardiovascular;  Laterality: N/A;  :   Current outpatient prescriptions:  .  amLODipine (NORVASC) 2.5 MG tablet, TAKE ONE TABLET BY MOUTH ONCE DAILY, Disp: 30 tablet, Rfl: 0 .  KLOR-CON M20 20 MEQ tablet, TAKE TWO TABLETS BY MOUTH IN THE MORNING AND ONE IN THE EVENING, Disp: 90 tablet, Rfl: 0 .  Macitentan (OPSUMIT) 10 MG TABS, Take 1 tablet by mouth daily with breakfast., Disp: 30 tablet, Rfl: 6 .  rosuvastatin (CRESTOR) 20 MG tablet, Take 20 mg by mouth at bedtime. , Disp: , Rfl:  .  Tadalafil,  PAH, 20 MG TABS, Take 2 tablets (40 mg total) by mouth daily., Disp: 180 tablet, Rfl: 3 .  torsemide (DEMADEX) 20 MG tablet, TAKE ONE & A-HALF TABLETS BY MOUTH TWICE DAILY, Disp: 90 tablet, Rfl: 0 .  Treprostinil (TYVASO) 0.6 MG/ML SOLN, Inhale 18 mcg into the lungs 4 (four) times daily., Disp: 3.6 mL, Rfl: 3 .  warfarin (COUMADIN) 5 MG tablet, Take 2.5-5 mg by mouth daily. Takes 1/2 tablet on Monday, Wednesday and Friday and 1 tablet all other days, Disp: , Rfl: :  No Known Allergies:  Family History   Problem Relation Age of Onset  . Heart Problems Brother   . Hypertension Brother   . Alzheimer's disease Mother   . Heart attack Father   . Thyroid disease Maternal Aunt   . Pulmonary Hypertension Cousin   . Heart Problems Maternal Aunt   . Thyroid disease Daughter   . Thyroid disease Daughter   :  History   Social History  . Marital Status: Widowed    Spouse Name: N/A  . Number of Children: N/A  . Years of Education: N/A   Occupational History  . retired    Social History Main Topics  . Smoking status: Never Smoker   . Smokeless tobacco: Not on file  . Alcohol Use: No  . Drug Use: No  . Sexual Activity: Not on file   Other Topics Concern  . Not on file   Social History Narrative  :  Pertinent items are noted in HPI.  Exam: Blood pressure 126/68, pulse 62, temperature 99.2 F (37.3 C), temperature source Oral, resp. rate 18, height 5' 2.5" (1.588 m), weight 169 lb (76.658 kg). General appearance: alert and cooperative Head: Normocephalic, without obvious abnormality Throat: lips, mucosa, and tongue normal; teeth and gums normal Neck: no adenopathy Back: negative Resp: clear to auscultation bilaterally Chest wall: no tenderness Cardio: regular rate and rhythm, S1, S2 normal, no murmur, click, rub or gallop GI: soft, non-tender; bowel sounds normal; no masses,  no organomegaly Extremities: extremities normal, atraumatic, no cyanosis or edema Pulses: 2+ and symmetric Skin: Skin color, texture, turgor normal. No rashes or lesions Lymph nodes: Cervical, supraclavicular, and axillary nodes normal. Neurologic: Grossly normal  CBC    Component Value Date/Time   WBC 5.0 10/19/2014 1515   WBC 5.3 06/13/2012 1345   RBC 4.40 10/19/2014 1515   RBC 4.70 06/13/2012 1345   HGB 13.7 10/19/2014 1515   HGB 14.3 06/13/2012 1345   HCT 42.3 10/19/2014 1515   HCT 45.1 06/13/2012 1345   PLT 105* 10/19/2014 1515   PLT 133 Giant platelets present* 06/13/2012 1345   MCV  96.1 10/19/2014 1515   MCV 96.0 06/13/2012 1345   MCH 31.1 10/19/2014 1515   MCH 30.4 06/13/2012 1345   MCHC 32.4 10/19/2014 1515   MCHC 31.7 06/13/2012 1345   RDW 14.9 10/19/2014 1515   RDW 16.7* 06/13/2012 1345   LYMPHSABS 1.3 08/21/2013 1824   LYMPHSABS 1.0 06/13/2012 1345   MONOABS 0.6 08/21/2013 1824   MONOABS 0.4 06/13/2012 1345   EOSABS 0.1 08/21/2013 1824   EOSABS 0.0 06/13/2012 1345   BASOSABS 0.0 08/21/2013 1824   BASOSABS 0.0 06/13/2012 1345      Assessment and Plan:    74 year old woman with the following issues:  1. Thrombocytopenia: This has been documented since 2013 with platelet count fluctuating as well as 64 as high as 112. The differential diagnosis was discussed today with the patient extensively. The likely etiology  of includes preparation artifact related to platelet clumping which will underestimate her platelet count occasionally. Splenic sequestration could also be a possibility given her pulmonary hypertension. Medication effect is also possible but less likely. Other conditions such as TTP, HUS, DIC or HIT by extremely unlikely. I do not think she has a hematological condition such as myelodysplasia, leukemia or any other malignancy.  From a management standpoint of recommended repeating her blood count in 4-5 months and we will prepare the sample with citrate base tube to minimize his platelet clumping. I do not think there is any clinical consequence related to this surreptitious thrombocytopenia.  I do not think she needs further hematological workup including imaging studies or bone marrow biopsy.  2. History of cardiomyopathy: His question about possible amyloidosis although her workup has been unrevealing. See no evidence to suggest that based on her laboratory testing cleaning a negative SPEP pending UPEP as well as chemistries. She has a normal total protein and a negative fat pad biopsy.  3. Follow-up: Will be in 4 months for repeat blood count  and give my final recommendation.

## 2015-01-01 NOTE — Progress Notes (Signed)
Checked in new pt with no financial concerns. °

## 2015-01-02 ENCOUNTER — Ambulatory Visit (INDEPENDENT_AMBULATORY_CARE_PROVIDER_SITE_OTHER): Payer: Medicare Other | Admitting: *Deleted

## 2015-01-02 DIAGNOSIS — Z5181 Encounter for therapeutic drug level monitoring: Secondary | ICD-10-CM

## 2015-01-02 DIAGNOSIS — I4891 Unspecified atrial fibrillation: Secondary | ICD-10-CM | POA: Diagnosis not present

## 2015-01-02 DIAGNOSIS — I482 Chronic atrial fibrillation, unspecified: Secondary | ICD-10-CM

## 2015-01-02 LAB — POCT INR: INR: 1.9

## 2015-01-07 ENCOUNTER — Other Ambulatory Visit (HOSPITAL_COMMUNITY): Payer: Self-pay | Admitting: Internal Medicine

## 2015-01-16 ENCOUNTER — Inpatient Hospital Stay (HOSPITAL_COMMUNITY): Admission: RE | Admit: 2015-01-16 | Payer: Medicare Other | Source: Ambulatory Visit

## 2015-01-23 ENCOUNTER — Other Ambulatory Visit (HOSPITAL_COMMUNITY): Payer: Self-pay | Admitting: Internal Medicine

## 2015-01-23 ENCOUNTER — Telehealth: Payer: Self-pay

## 2015-01-23 NOTE — Telephone Encounter (Signed)
ERROR

## 2015-01-29 DIAGNOSIS — H3561 Retinal hemorrhage, right eye: Secondary | ICD-10-CM | POA: Diagnosis not present

## 2015-01-29 DIAGNOSIS — H35351 Cystoid macular degeneration, right eye: Secondary | ICD-10-CM | POA: Diagnosis not present

## 2015-01-29 DIAGNOSIS — H35073 Retinal telangiectasis, bilateral: Secondary | ICD-10-CM | POA: Diagnosis not present

## 2015-01-29 DIAGNOSIS — H35051 Retinal neovascularization, unspecified, right eye: Secondary | ICD-10-CM | POA: Diagnosis not present

## 2015-01-29 DIAGNOSIS — H25813 Combined forms of age-related cataract, bilateral: Secondary | ICD-10-CM | POA: Diagnosis not present

## 2015-01-29 DIAGNOSIS — H40013 Open angle with borderline findings, low risk, bilateral: Secondary | ICD-10-CM | POA: Diagnosis not present

## 2015-01-30 ENCOUNTER — Telehealth (HOSPITAL_COMMUNITY): Payer: Self-pay | Admitting: Vascular Surgery

## 2015-01-30 DIAGNOSIS — H35073 Retinal telangiectasis, bilateral: Secondary | ICD-10-CM | POA: Diagnosis not present

## 2015-01-30 DIAGNOSIS — H35351 Cystoid macular degeneration, right eye: Secondary | ICD-10-CM | POA: Diagnosis not present

## 2015-01-30 DIAGNOSIS — H4311 Vitreous hemorrhage, right eye: Secondary | ICD-10-CM | POA: Diagnosis not present

## 2015-01-30 DIAGNOSIS — H3561 Retinal hemorrhage, right eye: Secondary | ICD-10-CM | POA: Diagnosis not present

## 2015-01-30 DIAGNOSIS — H35051 Retinal neovascularization, unspecified, right eye: Secondary | ICD-10-CM | POA: Diagnosis not present

## 2015-01-30 NOTE — Telephone Encounter (Signed)
Called pt , left a message on home phone and cell to move her appt to a earlier time

## 2015-01-31 ENCOUNTER — Ambulatory Visit (HOSPITAL_COMMUNITY)
Admission: RE | Admit: 2015-01-31 | Discharge: 2015-01-31 | Disposition: A | Discharge status: Home / Self Care | Payer: Medicare Other | Source: Ambulatory Visit | Attending: Cardiology | Admitting: Cardiology

## 2015-01-31 ENCOUNTER — Encounter (HOSPITAL_COMMUNITY): Payer: Self-pay

## 2015-01-31 VITALS — BP 116/70 | HR 79 | Wt 168.4 lb

## 2015-01-31 DIAGNOSIS — I272 Other secondary pulmonary hypertension: Secondary | ICD-10-CM | POA: Diagnosis not present

## 2015-01-31 DIAGNOSIS — D693 Immune thrombocytopenic purpura: Secondary | ICD-10-CM | POA: Diagnosis not present

## 2015-01-31 DIAGNOSIS — I10 Essential (primary) hypertension: Secondary | ICD-10-CM | POA: Diagnosis not present

## 2015-01-31 DIAGNOSIS — Z7901 Long term (current) use of anticoagulants: Secondary | ICD-10-CM | POA: Diagnosis not present

## 2015-01-31 DIAGNOSIS — Z8249 Family history of ischemic heart disease and other diseases of the circulatory system: Secondary | ICD-10-CM | POA: Insufficient documentation

## 2015-01-31 DIAGNOSIS — Z79899 Other long term (current) drug therapy: Secondary | ICD-10-CM | POA: Diagnosis not present

## 2015-01-31 DIAGNOSIS — I482 Chronic atrial fibrillation: Secondary | ICD-10-CM | POA: Insufficient documentation

## 2015-01-31 DIAGNOSIS — I270 Primary pulmonary hypertension: Secondary | ICD-10-CM | POA: Diagnosis not present

## 2015-01-31 DIAGNOSIS — I5032 Chronic diastolic (congestive) heart failure: Secondary | ICD-10-CM | POA: Insufficient documentation

## 2015-01-31 DIAGNOSIS — E785 Hyperlipidemia, unspecified: Secondary | ICD-10-CM | POA: Insufficient documentation

## 2015-01-31 LAB — CBC
HCT: 43.1 % (ref 36.0–46.0)
Hemoglobin: 13.9 g/dL (ref 12.0–15.0)
MCH: 31.2 pg (ref 26.0–34.0)
MCHC: 32.3 g/dL (ref 30.0–36.0)
MCV: 96.9 fL (ref 78.0–100.0)
Platelets: 103 10*3/uL — ABNORMAL LOW (ref 150–400)
RBC: 4.45 MIL/uL (ref 3.87–5.11)
RDW: 14.4 % (ref 11.5–15.5)
WBC: 5 10*3/uL (ref 4.0–10.5)

## 2015-01-31 LAB — BASIC METABOLIC PANEL
ANION GAP: 8 (ref 5–15)
BUN: 15 mg/dL (ref 6–20)
CALCIUM: 9.3 mg/dL (ref 8.9–10.3)
CO2: 29 mmol/L (ref 22–32)
CREATININE: 1.03 mg/dL — AB (ref 0.44–1.00)
Chloride: 104 mmol/L (ref 101–111)
GFR calc Af Amer: >60 mL/min (ref >60)
GFR calc non Af Amer: 53 mL/min — ABNORMAL LOW (ref >60)
GLUCOSE: 86 mg/dL (ref 65–99)
Potassium: 3.7 mmol/L (ref 3.5–5.1)
SODIUM: 141 mmol/L (ref 135–145)

## 2015-01-31 LAB — BRAIN NATRIURETIC PEPTIDE: B Natriuretic Peptide: 433.3 pg/mL — ABNORMAL HIGH (ref 0.0–100.0)

## 2015-01-31 NOTE — Progress Notes (Signed)
6 min walk test completed with pt on 4 L [place], pt ambulated 1120 ft (341 M) pt tolerated well, sats ranged 89-94% on 4 L HR ranged 86-120

## 2015-01-31 NOTE — Progress Notes (Signed)
Patient ID: Vicki [name], female   DOB: 19-May-1941, 74 y.o.   MRN: [id number] PCP: Dr. Wilson Singer  Ms. Stannard is a 74 yo with history of chronic diastolic CHF and chronic atrial fibrillation presents for followup of pulmonary hypertension.  Patient was followed by Dr. Glade Lloyd in the past for chronic atrial fibrillation.  She has been on coumadin.  She reports progressive exertional dyspnea since 2011.  This gradually worsened and became quite significant over the last few months.  She used to have significant HTN, but more recently her BP has been on the lower side.  Echo was done in 2/14, showing severe concentric LVH with EF 55-60%, moderately dilated RV, moderate to severe TR, and PA systolic pressure 86 mmHg.  I did a right heart cath in 5/14.  This showed PA pressure 104/36 with PCWP 20, suggesting pulmonary arterial HTN well out of proportion to the mildly elevate wedge pressure.  She was already on amlodipine so I did not do vasodilator testing.  V/Q scan was done, showing no evidence for chronic PEs.  PFTs showed a restrictive defect. Cardiac MRI did not show definite evidence for amyloid.  I started her on macitentan 10 mg daily.  Initially, she felt better on macitentan.  However, she was admitted in 5/14 from her sleep study due to orthopnea and dyspnea.  She was diuresed for several days and diuretic was switched over to torsemide.  I next started her on tadalafil 20 mg daily and titrated up to 40 mg daily.  She thinks that this helped.  She saw pulmonary after chest CT (showed mosaic attenuation in lungs).  This was thought to be due to air-trapping rather than ILD.  She was started on Spiriva. She wears oxygen at home.   She had an echocardiogram in 2/15 that showed severe LVH, EF 75%, small pericardial effusion, mildly dilated RV with mildly decreased systolic function, PA systolic pressure 84 mmHg.  She is not totally sure if she was taking macitentan and tadalafil at that time.  She has had a hard  month.  She ran out of her macitentan and tadalafil and her insurance company refused to refill them.  After this, she took a decided turn for the worse.  [place] her [place] in the fall.  She became much more short of breath, just with walking around the house. She developed lightheaded spells, especially with micturation.  Given lightheadedness and presyncope, she was admitted last week.  She was restarted on her medications and she finally got back on her meds at home.  In the hospital, she was noted to be bradycardic with HR to 30s so metoprolol was stopped.  Of note, her abdominal fat pad biopsy did not suggest amyloidosis. Repeat RHC in 3/15 still with moderate to severe PAH and low CI.  Holter off beta blocker in 3/15 showed average HR 73.   She presents today for HF/PAH follow up. On Tyvaso, tadalafil, and macitentan for pulmonary hypertension. Weight stable. No chest pain, lightheadedness, syncope.  No orthopnea/PND.  Has no problem with steps and can walk as long as she wants on flat ground. Needs oxygen very seldomly in the day, but sleeps with it.  She cooks and cleans and does some yardwork. Can vacuum now without SOB. She is taking all of medications as directed and has no problem getting her medicines. Pulse Ox at home 92-97% depending on activity.  Having a  problem with R eye but seeing opthalmologist.  6 minute walk (5/14): 122 m.   6 minute walk (7/14): 152 m 6 minute walk (10/14): 183 m 6 minute walk (4/15): 317 m 6 minute walk (12/15): 259 m 6 minute walk (4/16): 293 m 6 minute walk (8/16): 341 m  Labs (12/13): HCT 45.1, plts 153, K 3.5, creatinine 1.0 Labs (1/14): BNP 849 Labs (4/14): K 3.1, creatinine 1.0, ANA and anti-SCL-70 antibody negative.  Rheumatoid factor negative.  Serum immunofixation did not show monoclonal light chains.  Labs (5/14): K 3.3, creatinine 0.79, proBNP 5147 Labs (6/14): K 4, creatinine 0.9,  BNP 1001 Labs (10/14): LDL 53, LDL-P 975, K 3.7, creatinine 1.2 Labs (11/14): K 3.7, creatinine 0.9, BNP 832 Labs (2/15): K 3.5, creatinine 1.10, HCT 42.6, UPEP negative, SPEP negative.  Labs (3/15): K 3.8, creatinine 0.88 Labs (4/15): K 3.7, creatinine 0.99, proBNP 1653 Labs (7/15): K 4, creatinine 0.93 Labs (12/15): LDL 77, HDL 58, K 3.9, creatinine 1.03, LFTs normal Labs (4/16): K 3.8, creatinine 0.93, BNP 475, plts 105, HCT 42.3  PMH: 1. Chronic diastolic CHF: Echo (7/20) with EF 55-60%, severe LVH (no SAM, no asymmetric hypertrophy, no LVOT gradient), moderate-severe LAE, moderately dilated RV with mildly decreased systolic function, moderate to severe RAE, PA systolic pressure 86 mmHg, moderate-severe TR, moderate MR, trivial pericardial effusion.  Cardiac MRI (5/14): EF 65%, severe LVH, no definite evidence for amyloidosis (no delayed enhancement, myocardium not difficult to null).  Echo (2/15) with EF 75%, severe LVH, grade II diastolic dysfunction, moderate MR, RV mildly dilated with mildly decreased systolic function, moderate TR, PA systolic pressure 84 mmHg, small pericardial effusion. Abdominal fat pad biopsy (2/15) showed no evidence for amyloidosis.  SPEP/UPEP negative 2/15. Echo (4/16) with EF 60-65%, severe LVH, RV mildly dilated with mildly decreased systolic function, PA systolic pressure 40 mmHg.  2. Chronic atrial fibrillation since around 2004.  Developed bradycardia and metoprolol stopped 2/15. Holter (3/15) with average HR 73, atrial fibrillation, 3.8 sec pause x 1 while asleep, PVCs.  3. HTN: For decades.  4. LHC (2/08) with no significant disease.  5. Chronic thrombocytopenia: ITP 6. Pulmonary arterial HTN: RHC (5/14) with mean RA 13, PA 104/36 (mean 63), mean PCWP 20 on right and 23 on left, CI 2.3 (Fick) and 1.6 (thermo), PVR 10.4 WU (Fick) and 15 WU (thermo).  Vasodilator testing not done as patient was already on amlodipine.  V/Q scan (5/14) with no evidence for chronic  PE.  ANA, RF, and anti-SCL70 antibody negative.  PFTs (5/14) with FEV1 60%, FVC 54%, ratio 112%, TLC 61%, DLCO 43% => restrictive defect.  Sleep study (7/14) with no OSA.  CT chest with areas of mosaic attenuation in lungs (saw pulmonary, thought air trapping and not ILD). RHC (3/15) with RA mean 5, PA 66/31 mean 43, PCWP mean 11, Cardiac Index (Fick) 1.97, PVR 9.2 WU.  Patient was started on Tyvaso in 3/15.  Echo (4/16) with mildly dilated and mildly dysfunctional RV, PA systolic pressure 40 mmHg.   SH: Married, retired from a bank, 3 children, lives in [place].   FH: No heart disease, +HTN.  ROS: All systems reviewed and negative except as per HPI.   Current Outpatient Prescriptions  Medication Sig Dispense Refill  . amLODipine (NORVASC) 2.5 MG tablet TAKE ONE TABLET BY MOUTH ONCE DAILY 30 tablet 0  . KLOR-CON M20 20 MEQ tablet TAKE TWO TABLETS BY MOUTH IN THE MORNING AND ONE IN THE EVENING 90 tablet 0  .  Macitentan (OPSUMIT) 10 MG TABS Take 1 tablet by mouth daily with breakfast. 30 tablet 6  . rosuvastatin (CRESTOR) 20 MG tablet Take 20 mg by mouth at bedtime.     . Tadalafil, PAH, 20 MG TABS Take 2 tablets (40 mg total) by mouth daily. 180 tablet 3  . torsemide (DEMADEX) 20 MG tablet TAKE ONE & ONE-HALF TABLETS BY MOUTH TWICE DAILY 90 tablet 0  . Treprostinil (TYVASO) 0.6 MG/ML SOLN Inhale 18 mcg into the lungs 4 (four) times daily. 3.6 mL 3  . warfarin (COUMADIN) 5 MG tablet Take 2.5-5 mg by mouth daily. Takes 1/2 tablet on Monday, Wednesday and Friday and 1 tablet all other days     No current facility-administered medications for this encounter.    BP 116/70 mmHg  Pulse 79  Wt 168 lb 6.4 oz (76.386 kg)  SpO2 91% General: NAD Neck: JVP 7 cm, no thyromegaly or thyroid nodule.  Lungs: Slight dry crackles at bases bilaterally otherwise clear CV: Nondisplaced PMI.  Heart irregular S1/S2, 2/6 HSM apex.  No edema.  No carotid bruit.  Normal pedal pulses.  Abdomen: Soft, nontender,  no hepatosplenomegaly, no distention.  Neurologic: Alert and oriented x 3.  Psych: Normal affect. Extremities: No clubbing or cyanosis.   Assessment/Plan: 1. Pulmonary HTN: Patient has severe pulmonary arterial HTN.  Last RHC in 3/15 showed CI 1.97, PVR 9.  It is possible that long-standing PCWP elevation from diastolic LV dysfunction could lead to pulmonary vascular changes with pulmonary arterial hypertension out of proportion to the PCWP elevation.  However, suspect idiopathic primary pulmonary HTN (Group 1). Collagen vascular disease workup was negative (negative RF, ANA and negative anti-SCL-70).  V/Q scan was not suggestive of chronic PEs.  PFTs were suggestive of restrictive lung disease but CT chest and evaluation by pulmonary did not suggest interstitial lung disease.  Sleep study did not show OSA.  Last echo in 4/16 showed mildly dysfunctional RV with PA systolic pressure 43 mmHg.  6 minute walk today considerably improved at 341 m.   - Continue oxygen at night and with exertion as needed  - Continue Tyvaso, macitentan, Adcirca.    2. Chronic diastolic CHF: EF preserved on echo with severe concentric LVH and a small pericardial effusion.  It is possible that the LVH is due to years of HTN.  The cardiac MRI was not definitively suggestive of cardiac amyloidosis.  I was still concerned for amyloidosis given the appearance of the LV myocardium on echoes.  SPEP/UPEP negative.  Abdominal fat pad biopsy showed no evidence for amyloidosis.  - Continue current torsemide for now, volume ok. Check BMET/BNP today.  3. HTN: BP stable, SBP in 120s at home.  Can continue current dose of amlodipine.  4. Chronic atrial fibrillation: Continue coumadin.  She is off metoprolol due to bradycardia. Holter off metoprolol did not show any high rate episodes.   5. Hyperlipidemia: On Crestor, good lipids 12/15.   Followup in 4 months.  Labs today.  Satira Mccallum Tillery PA-C 01/31/2015   Patient seen with PA,  agree with the above note.  She is doing quite well.  6 minute walk is improved.  She looks euvolemic.  Continue current PH meds.  Will have her get BMET/BNP today and followup in 4 months.   [name] 02/02/2015

## 2015-01-31 NOTE — Patient Instructions (Signed)
Labs today  We will contact you in 4 months to schedule your next appointment.  

## 2015-02-04 ENCOUNTER — Other Ambulatory Visit (HOSPITAL_COMMUNITY): Payer: Self-pay | Admitting: Internal Medicine

## 2015-02-13 ENCOUNTER — Ambulatory Visit (INDEPENDENT_AMBULATORY_CARE_PROVIDER_SITE_OTHER): Payer: Medicare Other | Admitting: *Deleted

## 2015-02-13 DIAGNOSIS — I482 Chronic atrial fibrillation, unspecified: Secondary | ICD-10-CM

## 2015-02-13 DIAGNOSIS — I4891 Unspecified atrial fibrillation: Secondary | ICD-10-CM | POA: Diagnosis not present

## 2015-02-13 DIAGNOSIS — Z5181 Encounter for therapeutic drug level monitoring: Secondary | ICD-10-CM | POA: Diagnosis not present

## 2015-02-13 LAB — POCT INR: INR: 2.3

## 2015-02-20 ENCOUNTER — Other Ambulatory Visit (HOSPITAL_COMMUNITY): Payer: Self-pay | Admitting: Cardiology

## 2015-02-21 DIAGNOSIS — I4891 Unspecified atrial fibrillation: Secondary | ICD-10-CM | POA: Diagnosis not present

## 2015-02-28 DIAGNOSIS — R899 Unspecified abnormal finding in specimens from other organs, systems and tissues: Secondary | ICD-10-CM | POA: Diagnosis not present

## 2015-02-28 DIAGNOSIS — I10 Essential (primary) hypertension: Secondary | ICD-10-CM | POA: Diagnosis not present

## 2015-02-28 DIAGNOSIS — I4891 Unspecified atrial fibrillation: Secondary | ICD-10-CM | POA: Diagnosis not present

## 2015-02-28 DIAGNOSIS — E789 Disorder of lipoprotein metabolism, unspecified: Secondary | ICD-10-CM | POA: Diagnosis not present

## 2015-03-06 ENCOUNTER — Other Ambulatory Visit (HOSPITAL_COMMUNITY): Payer: Self-pay | Admitting: Internal Medicine

## 2015-03-14 ENCOUNTER — Other Ambulatory Visit (HOSPITAL_COMMUNITY): Payer: Self-pay | Admitting: *Deleted

## 2015-03-14 DIAGNOSIS — I272 Pulmonary hypertension, unspecified: Secondary | ICD-10-CM

## 2015-03-14 MED ORDER — TADALAFIL (PAH) 20 MG PO TABS: 40.0000 mg | ORAL_TABLET | Freq: Every day | ORAL | Status: DC

## 2015-03-19 ENCOUNTER — Other Ambulatory Visit (HOSPITAL_COMMUNITY): Payer: Self-pay | Admitting: Cardiology

## 2015-03-20 DIAGNOSIS — H35351 Cystoid macular degeneration, right eye: Secondary | ICD-10-CM | POA: Diagnosis not present

## 2015-03-20 DIAGNOSIS — H35051 Retinal neovascularization, unspecified, right eye: Secondary | ICD-10-CM | POA: Diagnosis not present

## 2015-03-20 DIAGNOSIS — H3561 Retinal hemorrhage, right eye: Secondary | ICD-10-CM | POA: Diagnosis not present

## 2015-03-20 DIAGNOSIS — H40013 Open angle with borderline findings, low risk, bilateral: Secondary | ICD-10-CM | POA: Diagnosis not present

## 2015-03-20 DIAGNOSIS — H35073 Retinal telangiectasis, bilateral: Secondary | ICD-10-CM | POA: Diagnosis not present

## 2015-03-20 DIAGNOSIS — H25813 Combined forms of age-related cataract, bilateral: Secondary | ICD-10-CM | POA: Diagnosis not present

## 2015-03-20 DIAGNOSIS — H4311 Vitreous hemorrhage, right eye: Secondary | ICD-10-CM | POA: Diagnosis not present

## 2015-03-27 ENCOUNTER — Ambulatory Visit (INDEPENDENT_AMBULATORY_CARE_PROVIDER_SITE_OTHER): Payer: Medicare Other | Admitting: *Deleted

## 2015-03-27 DIAGNOSIS — I482 Chronic atrial fibrillation, unspecified: Secondary | ICD-10-CM

## 2015-03-27 DIAGNOSIS — I4891 Unspecified atrial fibrillation: Secondary | ICD-10-CM

## 2015-03-27 DIAGNOSIS — Z5181 Encounter for therapeutic drug level monitoring: Secondary | ICD-10-CM

## 2015-03-27 LAB — POCT INR: INR: 1.9

## 2015-04-02 ENCOUNTER — Ambulatory Visit: Payer: Self-pay | Admitting: Ophthalmology

## 2015-04-02 ENCOUNTER — Ambulatory Visit (INDEPENDENT_AMBULATORY_CARE_PROVIDER_SITE_OTHER): Payer: Medicare Other | Admitting: *Deleted

## 2015-04-02 ENCOUNTER — Encounter (HOSPITAL_COMMUNITY): Payer: Self-pay | Admitting: *Deleted

## 2015-04-02 DIAGNOSIS — I482 Chronic atrial fibrillation, unspecified: Secondary | ICD-10-CM

## 2015-04-02 DIAGNOSIS — Z5181 Encounter for therapeutic drug level monitoring: Secondary | ICD-10-CM | POA: Diagnosis not present

## 2015-04-02 DIAGNOSIS — I4891 Unspecified atrial fibrillation: Secondary | ICD-10-CM | POA: Diagnosis not present

## 2015-04-02 LAB — POCT INR: INR: 2.3

## 2015-04-02 NOTE — Progress Notes (Signed)
Anesthesia Chart Review:  Pt is 74 year old female scheduled for R eye pars plana vitrectomy with 25G for vitreous hemorrhage, endo laser on 04/03/2015 with Dr. Posey Pronto.   Pt is a same day work up.   Cardiologist is Dr. Aundra Dubin, last office visit 01/31/15. PCP is Dr. Wilson Singer.   PMH includes: atrial fibrillation, CHF, pulmonary hypertension, thrombocytopenia, heart murmur, melanoma. Use 4L O2 (at night and occasionally during day). Never smoker. BMI 30.  Medications include: amlodipine, potassium, macitentan, crestor, tadalafil, torsemide, treprostinil, coumadin. Pt to to stay on coumadin per Dr. Posey Pronto.   CBC, BMET, PT/INR will be obtained DOS.   EKG 01/31/2015: Atrial fibrillation with premature ventricular or aberrantly conducted complexes. Left ventricular hypertrophy with repolarization abnormality. Prolonged QT  Echo 10/19/2014:  - Left ventricle: The cavity size was normal. Wall thickness wasincreased in a pattern of severe LVH. Systolic function wasnormal. The estimated ejection fraction was in the range of 60%to 65%. Wall motion was normal; there were no regional wallmotion abnormalities. Indeterminant diastolic function. - Ventricular septum: Mild D-shape to interventricular septum suggesting RV pressure/volume overload. - Aortic valve: There was no stenosis. - Mitral valve: There was mild to moderate regurgitation. - Left atrium: The atrium was severely dilated. - Right ventricle: The cavity size was mildly dilated. Systolic function was mildly reduced. - Right atrium: The atrium was severely dilated. - Pulmonary arteries: PA peak pressure: 43 mm Hg (S). - Inferior vena cava: The vessel was normal in size. The respirophasic diameter changes were in the normal range (>= 50%),consistent with normal central venous pressure. - Pericardium, extracardiac: Small pericardial effusion. -Impressions: Normal LV size with severe LV hypertrophy. EF 60-65%. Mild tomoderate MR. Mildly dilated RV with  mildly decreased systolicfunction. Mild pulmonary hypertension.  R cardiac cath 09/07/2013: Hemodynamics (mmHg) RA mean 5 RV RV 66/5 PA 66/31, mean 43 PCWP mean 11  Oxygen saturations: PA 64% AO 98%  Cardiac Output (Fick) 3.47  Cardiac Index (Fick) 1.97 PVR 9.2 WU  -Final Conclusions: Moderate to severe pulmonary arterial hypertension with decreased cardiac index and high PVR. She is already on macitentan and tadalafil. I am going to look into evaluation for [place] at this point.   Holter monitor 09/01/2013: chronic atrial fibrillation, avg HR 73  Reviewed case with Dr. Kalman Shan.   If no changes and labs are acceptable DOS, I anticipate pt can proceed with surgery as scheduled.   Willeen Cass, FNP-BC [facility]/Anesthesiology Phone: [phone] 04/02/2015 4:12 PM

## 2015-04-02 NOTE — H&P (Signed)
  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 Atrial fibrillation ([place])   . CHF (congestive heart failure) ([place])   . Pulmonary hypertension ([place])   . Dysrhythmia   . Heart murmur   . Thrombocytopenia ([place]) 7/16  . Cancer ([place])     Mylenoma- top of head  . On home oxygen therapy     4 bliters  . Seasonal allergies   . 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 Heart Problems Brother   . Hypertension Brother   . Alzheimer's disease Mother   . Heart attack Father   . Thyroid disease Maternal Aunt   . Pulmonary Hypertension Cousin   . Heart Problems Maternal Aunt   . Thyroid disease Daughter   . Thyroid disease Daughter     General:  Healthy appearing patient in no distress.      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

## 2015-04-02 NOTE — Progress Notes (Addendum)
I spoke with Vicki [name], she denies chest pain or shortness of breath. Patient reported that [facility] and that Dr Posey Pronto said it is ok to continue Coumadin.   I spoke with Dr Posey Pronto to ask for orders and ok continuing Coumadin.  Dr Posey Pronto said he will check INR result this afternoon and if INR < 2.3 that surgery is on. Dr Posey Pronto said for patient to take Coumadin in the afternoon after surgery, instead of am before. (Patient takes Coumadin in the afternoon.)

## 2015-04-03 ENCOUNTER — Ambulatory Visit (HOSPITAL_COMMUNITY): Payer: Medicare Other | Admitting: Emergency Medicine

## 2015-04-03 ENCOUNTER — Encounter (HOSPITAL_COMMUNITY)
Admission: RE | Disposition: A | Discharge status: Home / Self Care | Payer: Self-pay | Source: Ambulatory Visit | Attending: Ophthalmology

## 2015-04-03 ENCOUNTER — Ambulatory Visit (HOSPITAL_COMMUNITY)
Admission: RE | Admit: 2015-04-03 | Discharge: 2015-04-03 | Disposition: A | Discharge status: Home / Self Care | Payer: Medicare Other | Source: Ambulatory Visit | Attending: Ophthalmology | Admitting: Ophthalmology

## 2015-04-03 ENCOUNTER — Encounter (HOSPITAL_COMMUNITY): Payer: Self-pay | Admitting: General Practice

## 2015-04-03 DIAGNOSIS — H43311 Vitreous membranes and strands, right eye: Secondary | ICD-10-CM | POA: Diagnosis not present

## 2015-04-03 DIAGNOSIS — H43821 Vitreomacular adhesion, right eye: Secondary | ICD-10-CM | POA: Insufficient documentation

## 2015-04-03 DIAGNOSIS — H35071 Retinal telangiectasis, right eye: Secondary | ICD-10-CM | POA: Diagnosis not present

## 2015-04-03 DIAGNOSIS — Z9981 Dependence on supplemental oxygen: Secondary | ICD-10-CM | POA: Diagnosis not present

## 2015-04-03 DIAGNOSIS — H3561 Retinal hemorrhage, right eye: Secondary | ICD-10-CM | POA: Insufficient documentation

## 2015-04-03 DIAGNOSIS — R011 Cardiac murmur, unspecified: Secondary | ICD-10-CM | POA: Insufficient documentation

## 2015-04-03 DIAGNOSIS — Z85820 Personal history of malignant melanoma of skin: Secondary | ICD-10-CM | POA: Diagnosis not present

## 2015-04-03 DIAGNOSIS — M13829 Other specified arthritis, unspecified elbow: Secondary | ICD-10-CM | POA: Diagnosis not present

## 2015-04-03 DIAGNOSIS — I272 Other secondary pulmonary hypertension: Secondary | ICD-10-CM | POA: Diagnosis not present

## 2015-04-03 DIAGNOSIS — D696 Thrombocytopenia, unspecified: Secondary | ICD-10-CM | POA: Diagnosis not present

## 2015-04-03 DIAGNOSIS — M1990 Unspecified osteoarthritis, unspecified site: Secondary | ICD-10-CM | POA: Diagnosis not present

## 2015-04-03 DIAGNOSIS — I509 Heart failure, unspecified: Secondary | ICD-10-CM | POA: Diagnosis not present

## 2015-04-03 DIAGNOSIS — I4891 Unspecified atrial fibrillation: Secondary | ICD-10-CM | POA: Diagnosis not present

## 2015-04-03 DIAGNOSIS — H4311 Vitreous hemorrhage, right eye: Secondary | ICD-10-CM | POA: Diagnosis not present

## 2015-04-03 DIAGNOSIS — I482 Chronic atrial fibrillation: Secondary | ICD-10-CM | POA: Insufficient documentation

## 2015-04-03 HISTORY — DX: Dependence on supplemental oxygen: Z99.81

## 2015-04-03 HISTORY — DX: Cardiac murmur, unspecified: R01.1

## 2015-04-03 HISTORY — DX: Unspecified osteoarthritis, unspecified site: M19.90

## 2015-04-03 HISTORY — DX: Malignant (primary) neoplasm, unspecified: C80.1

## 2015-04-03 HISTORY — DX: Thrombocytopenia, unspecified: D69.6

## 2015-04-03 HISTORY — PX: PARS PLANA VITRECTOMY: SHX2166

## 2015-04-03 HISTORY — DX: Other seasonal allergic rhinitis: J30.2

## 2015-04-03 HISTORY — DX: Cardiac arrhythmia, unspecified: I49.9

## 2015-04-03 LAB — PROTIME-INR
INR: 1.85 — AB (ref 0.00–1.49)
Prothrombin Time: 21.3 seconds — ABNORMAL HIGH (ref 11.6–15.2)

## 2015-04-03 LAB — BASIC METABOLIC PANEL
Anion gap: 12 (ref 5–15)
BUN: 11 mg/dL (ref 6–20)
CHLORIDE: 100 mmol/L — AB (ref 101–111)
CO2: 25 mmol/L (ref 22–32)
Calcium: 8.8 mg/dL — ABNORMAL LOW (ref 8.9–10.3)
Creatinine, Ser: 0.89 mg/dL (ref 0.44–1.00)
GFR calc Af Amer: >60 mL/min (ref >60)
GFR calc non Af Amer: >60 mL/min (ref >60)
GLUCOSE: 93 mg/dL (ref 65–99)
POTASSIUM: 4.6 mmol/L (ref 3.5–5.1)
Sodium: 137 mmol/L (ref 135–145)

## 2015-04-03 LAB — CBC
HEMATOCRIT: 42.8 % (ref 36.0–46.0)
Hemoglobin: 13.7 g/dL (ref 12.0–15.0)
MCH: 31.1 pg (ref 26.0–34.0)
MCHC: 32 g/dL (ref 30.0–36.0)
MCV: 97.3 fL (ref 78.0–100.0)
Platelets: DECREASED 10*3/uL (ref 150–400)
RBC: 4.4 MIL/uL (ref 3.87–5.11)
RDW: 14.2 % (ref 11.5–15.5)
WBC: 4.1 10*3/uL (ref 4.0–10.5)

## 2015-04-03 SURGERY — PARS PLANA VITRECTOMY WITH 25 GAUGE
Anesthesia: Monitor Anesthesia Care | Site: Eye | Laterality: Right

## 2015-04-03 MED ORDER — FENTANYL CITRATE (PF) 250 MCG/5ML IJ SOLN
INTRAMUSCULAR | Status: DC | PRN
  Administered 2015-04-03 (×2): 25 ug via INTRAVENOUS

## 2015-04-03 MED ORDER — PROPOFOL 10 MG/ML IV BOLUS
INTRAVENOUS | Status: DC | PRN
  Administered 2015-04-03: 50 mg via INTRAVENOUS
  Administered 2015-04-03: 20 mg via INTRAVENOUS

## 2015-04-03 MED ORDER — SODIUM CHLORIDE 0.9 % IV SOLN
INTRAVENOUS | Status: DC
  Administered 2015-04-03: 14:00:00 via INTRAVENOUS

## 2015-04-03 MED ORDER — MIDAZOLAM HCL 2 MG/2ML IJ SOLN
INTRAMUSCULAR | Status: AC
  Filled 2015-04-03: qty 4

## 2015-04-03 MED ORDER — LIDOCAINE HCL 2 % IJ SOLN
INTRAMUSCULAR | Status: AC
  Filled 2015-04-03: qty 20

## 2015-04-03 MED ORDER — NA CHONDROIT SULF-NA HYALURON 40-30 MG/ML IO SOLN
INTRAOCULAR | Status: AC
  Filled 2015-04-03: qty 0.5

## 2015-04-03 MED ORDER — HYALURONIDASE HUMAN 150 UNIT/ML IJ SOLN
INTRAMUSCULAR | Status: AC
  Filled 2015-04-03: qty 1

## 2015-04-03 MED ORDER — BSS PLUS IO SOLN
INTRAOCULAR | Status: AC
  Filled 2015-04-03: qty 500

## 2015-04-03 MED ORDER — POLYMYXIN B SULFATE 500000 UNITS IJ SOLR
INTRAMUSCULAR | Status: AC
  Filled 2015-04-03: qty 1

## 2015-04-03 MED ORDER — TOBRAMYCIN-DEXAMETHASONE 0.3-0.1 % OP OINT
TOPICAL_OINTMENT | OPHTHALMIC | Status: AC
  Filled 2015-04-03: qty 3.5

## 2015-04-03 MED ORDER — EPINEPHRINE HCL 1 MG/ML IJ SOLN
INTRAOCULAR | Status: DC | PRN
  Administered 2015-04-03: 16:00:00

## 2015-04-03 MED ORDER — ATROPINE SULFATE 1 % OP SOLN
1.0000 [drp] | OPHTHALMIC | Status: AC | PRN
  Administered 2015-04-03 (×3): 1 [drp] via OPHTHALMIC
  Filled 2015-04-03: qty 5

## 2015-04-03 MED ORDER — PROMETHAZINE HCL 25 MG/ML IJ SOLN: 6.2500 mg | INTRAMUSCULAR | Status: DC | PRN

## 2015-04-03 MED ORDER — GENTAMICIN SULFATE 40 MG/ML IJ SOLN
INTRAMUSCULAR | Status: AC
  Filled 2015-04-03: qty 2

## 2015-04-03 MED ORDER — DEXAMETHASONE SODIUM PHOSPHATE 10 MG/ML IJ SOLN
INTRAMUSCULAR | Status: AC
  Filled 2015-04-03: qty 1

## 2015-04-03 MED ORDER — EPINEPHRINE HCL 1 MG/ML IJ SOLN
INTRAMUSCULAR | Status: AC
  Filled 2015-04-03: qty 1

## 2015-04-03 MED ORDER — HYDROMORPHONE HCL 1 MG/ML IJ SOLN: 0.2500 mg | INTRAMUSCULAR | Status: DC | PRN

## 2015-04-03 MED ORDER — 0.9 % SODIUM CHLORIDE (POUR BTL) OPTIME
TOPICAL | Status: DC | PRN
  Administered 2015-04-03: 100 mL

## 2015-04-03 MED ORDER — ATROPINE SULFATE 1 % OP SOLN
OPHTHALMIC | Status: AC
  Filled 2015-04-03: qty 5

## 2015-04-03 MED ORDER — STERILE WATER FOR IRRIGATION IR SOLN
Status: DC | PRN
  Administered 2015-04-03: 200 mL

## 2015-04-03 MED ORDER — OFLOXACIN 0.3 % OP SOLN
1.0000 [drp] | OPHTHALMIC | Status: AC | PRN
  Administered 2015-04-03 (×3): 1 [drp] via OPHTHALMIC
  Filled 2015-04-03: qty 5

## 2015-04-03 MED ORDER — HYPROMELLOSE (GONIOSCOPIC) 2.5 % OP SOLN
OPHTHALMIC | Status: AC
  Filled 2015-04-03: qty 15

## 2015-04-03 MED ORDER — BUPIVACAINE HCL (PF) 0.75 % IJ SOLN
INTRAMUSCULAR | Status: AC
  Filled 2015-04-03: qty 10

## 2015-04-03 MED ORDER — TETRACAINE HCL 0.5 % OP SOLN
OPHTHALMIC | Status: AC
  Filled 2015-04-03: qty 2

## 2015-04-03 MED ORDER — TOBRAMYCIN-DEXAMETHASONE 0.3-0.1 % OP OINT
TOPICAL_OINTMENT | OPHTHALMIC | Status: DC | PRN
  Administered 2015-04-03: 1 via OPHTHALMIC

## 2015-04-03 MED ORDER — HYPROMELLOSE (GONIOSCOPIC) 2.5 % OP SOLN
OPHTHALMIC | Status: DC | PRN
  Administered 2015-04-03: 2 [drp] via OPHTHALMIC

## 2015-04-03 MED ORDER — DEXAMETHASONE SODIUM PHOSPHATE 10 MG/ML IJ SOLN
INTRAMUSCULAR | Status: DC | PRN
  Administered 2015-04-03: 10 mg

## 2015-04-03 MED ORDER — PROPOFOL 10 MG/ML IV BOLUS
INTRAVENOUS | Status: AC
  Filled 2015-04-03: qty 20

## 2015-04-03 MED ORDER — FENTANYL CITRATE (PF) 250 MCG/5ML IJ SOLN
INTRAMUSCULAR | Status: AC
  Filled 2015-04-03: qty 5

## 2015-04-03 MED ORDER — LIDOCAINE HCL 2 % IJ SOLN
INTRAMUSCULAR | Status: DC | PRN
  Administered 2015-04-03: 5 mL via RETROBULBAR

## 2015-04-03 MED ORDER — PHENYLEPHRINE HCL 2.5 % OP SOLN
1.0000 [drp] | OPHTHALMIC | Status: AC | PRN
  Administered 2015-04-03 (×3): 1 [drp] via OPHTHALMIC
  Filled 2015-04-03: qty 2

## 2015-04-03 MED ORDER — SODIUM HYALURONATE 10 MG/ML IO SOLN
INTRAOCULAR | Status: AC
  Filled 2015-04-03: qty 0.85

## 2015-04-03 MED ORDER — SODIUM CHLORIDE 0.9 % IJ SOLN
INTRAMUSCULAR | Status: AC
  Filled 2015-04-03: qty 10

## 2015-04-03 SURGICAL SUPPLY — 51 items
APPLICATOR COTTON TIP 6IN STRL (MISCELLANEOUS) ×2 IMPLANT
BLADE MVR KNIFE 20G (BLADE) IMPLANT
CANNULA ANT CHAM MAIN (OPHTHALMIC RELATED) IMPLANT
CANNULA DUAL BORE 23G (CANNULA) IMPLANT
CANNULA VLV SOFT TIP 25GA (OPHTHALMIC) ×2 IMPLANT
CAUTERY EYE LOW TEMP 1300F FIN (OPHTHALMIC RELATED) IMPLANT
CORDS BIPOLAR (ELECTRODE) ×2 IMPLANT
COVER MAYO STAND STRL (DRAPES) ×2 IMPLANT
DRAPE INCISE 51X51 W/FILM STRL (DRAPES) ×2 IMPLANT
DRAPE PROXIMA HALF (DRAPES) ×2 IMPLANT
DRAPE RETRACTOR (MISCELLANEOUS) ×2 IMPLANT
ERASER HMR WETFIELD 23G BP (MISCELLANEOUS) IMPLANT
FORCEPS ECKARDT ILM 25G SERR (OPHTHALMIC RELATED) IMPLANT
FORCEPS GRIESHABER ILM 25G A (INSTRUMENTS) ×2 IMPLANT
GAS AUTO FILL CONSTEL (OPHTHALMIC)
GAS AUTO FILL CONSTELLATION (OPHTHALMIC) IMPLANT
GLOVE BIOGEL PI IND STRL 7.5 (GLOVE) ×1 IMPLANT
GLOVE BIOGEL PI INDICATOR 7.5 (GLOVE) ×1
GOWN STRL REUS W/ TWL LRG LVL3 (GOWN DISPOSABLE) ×2 IMPLANT
GOWN STRL REUS W/TWL LRG LVL3 (GOWN DISPOSABLE) ×2
HANDLE PNEUMATIC FOR CONSTEL (OPHTHALMIC) IMPLANT
KIT BASIN OR (CUSTOM PROCEDURE TRAY) ×2 IMPLANT
KIT ROOM TURNOVER OR (KITS) ×2 IMPLANT
LENS BIOM SUPER VIEW SET DISP (OPHTHALMIC RELATED) ×2 IMPLANT
MICROPICK 25G (MISCELLANEOUS)
NEEDLE 18GX1X1/2 (RX/OR ONLY) (NEEDLE) ×4 IMPLANT
NEEDLE 25GX 5/8IN NON SAFETY (NEEDLE) ×2 IMPLANT
NEEDLE FILTER BLUNT 18X 1/2SAF (NEEDLE) ×1
NEEDLE FILTER BLUNT 18X1 1/2 (NEEDLE) ×1 IMPLANT
NEEDLE HYPO 25GX1X1/2 BEV (NEEDLE) IMPLANT
NEEDLE HYPO 30X.5 LL (NEEDLE) ×4 IMPLANT
NEEDLE RETROBULBAR 25GX1.5 (NEEDLE) ×2 IMPLANT
NS IRRIG 1000ML POUR BTL (IV SOLUTION) ×2 IMPLANT
PACK VITRECTOMY CUSTOM (CUSTOM PROCEDURE TRAY) ×2 IMPLANT
PAD ARMBOARD 7.5X6 YLW CONV (MISCELLANEOUS) ×4 IMPLANT
PAK PIK VITRECTOMY CVS 25GA (OPHTHALMIC) ×2 IMPLANT
PENCIL BIPOLAR 25GA STR DISP (OPHTHALMIC RELATED) ×2 IMPLANT
PICK MICROPICK 25G (MISCELLANEOUS) IMPLANT
PROBE LASER ILLUM FLEX CVD 25G (OPHTHALMIC) IMPLANT
ROLLS DENTAL (MISCELLANEOUS) IMPLANT
SCRAPER DIAMOND 25GA (OPHTHALMIC RELATED) ×2 IMPLANT
STOPCOCK 4 WAY LG BORE MALE ST (IV SETS) IMPLANT
STRIP CLOSURE SKIN 1/2X4 (GAUZE/BANDAGES/DRESSINGS) ×2 IMPLANT
SUT VICRYL 7 0 TG140 8 (SUTURE) IMPLANT
SYR 20CC LL (SYRINGE) ×2 IMPLANT
SYR 5ML LL (SYRINGE) ×2 IMPLANT
SYR TB 1ML LUER SLIP (SYRINGE) ×2 IMPLANT
SYRINGE 10CC LL (SYRINGE) IMPLANT
TOWEL OR 17X24 6PK STRL BLUE (TOWEL DISPOSABLE) ×4 IMPLANT
WATER STERILE IRR 1000ML POUR (IV SOLUTION) ×2 IMPLANT
WIPE INSTRUMENT VISIWIPE 73X73 (MISCELLANEOUS) ×2 IMPLANT

## 2015-04-03 NOTE — Transfer of Care (Signed)
Immediate Anesthesia Transfer of Care Note  Patient: Vicki [name]  Procedure(s) Performed: Procedure(s): PARS PLANA VITRECTOMY WITH 25 GAUGE FOR VETREOUS HEMORRHAGE, ENDO LASER (Right)  Patient Location: PACU  Anesthesia Type:MAC  Level of Consciousness: awake, alert  and oriented  Airway & Oxygen Therapy: Patient Spontanous Breathing and Patient connected to nasal cannula oxygen  Post-op Assessment: Report given to RN, Post -op Vital signs reviewed and stable and Patient moving all extremities  Post vital signs: Reviewed and stable  Last Vitals:  Filed Vitals:   04/03/15 1703  BP: 132/68  Pulse:   Temp: 36.7 C  Resp: 17    Complications: No apparent anesthesia complications

## 2015-04-03 NOTE — Discharge Instructions (Signed)
Keep patch in place.  F/u in eye clinic in AM.   Sleep on side with nose pointed to pillow.  During day keep head down, facing floor.

## 2015-04-03 NOTE — Anesthesia Postprocedure Evaluation (Signed)
Anesthesia Post Note  Patient: Vicki [name]  Procedure(s) Performed: Procedure(s) (LRB): PARS PLANA VITRECTOMY WITH 25 GAUGE FOR VETREOUS HEMORRHAGE, ENDO LASER (Right)  Anesthesia type: MAC  Patient location: PACU  Post pain: Pain level controlled  Post assessment: Patient's Cardiovascular Status Stable  Last Vitals:  Filed Vitals:   04/03/15 1703  BP: 132/68  Pulse:   Temp: 36.7 C  Resp: 17    Post vital signs: Reviewed and stable  Level of consciousness: sedated  Complications: No apparent anesthesia complications

## 2015-04-03 NOTE — Anesthesia Preprocedure Evaluation (Signed)
Anesthesia Evaluation  Patient identified by MRN, date of birth, ID band Patient awake    Reviewed: Allergy & Precautions, NPO status , Patient's Chart, lab work & pertinent test results  History of Anesthesia Complications Negative for: history of anesthetic complications  Airway Mallampati: II  TM Distance: >3 FB Neck ROM: Full    Dental  (+) Teeth Intact, Dental Advisory Given   Pulmonary neg pulmonary ROS,  Pulmonary hypertension   Pulmonary exam normal        Cardiovascular +CHF  + dysrhythmias Atrial Fibrillation  Rhythm:Irregular Rate:Normal     Neuro/Psych negative neurological ROS     GI/Hepatic negative GI ROS, Neg liver ROS,   Endo/Other  negative endocrine ROS  Renal/GU negative Renal ROS     Musculoskeletal   Abdominal   Peds  Hematology   Anesthesia Other Findings   Reproductive/Obstetrics                             Anesthesia Physical Anesthesia Plan  ASA: III  Anesthesia Plan: MAC   Post-op Pain Management:    Induction:   Airway Management Planned: Simple Face Mask  Additional Equipment:   Intra-op Plan:   Post-operative Plan:   Informed Consent: I have reviewed the patients History and Physical, chart, labs and discussed the procedure including the risks, benefits and alternatives for the proposed anesthesia with the patient or authorized representative who has indicated his/her understanding and acceptance.   Dental advisory given  Plan Discussed with: CRNA, Anesthesiologist and Surgeon  Anesthesia Plan Comments:         Anesthesia Quick Evaluation

## 2015-04-03 NOTE — Progress Notes (Signed)
Dr. Posey Pronto made aware of PT-INR results.  Eye drops ordered prior to surgery started.

## 2015-04-03 NOTE — H&P (Signed)
  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 Atrial fibrillation ([place])   . CHF (congestive heart failure) ([place])   . Pulmonary hypertension ([place])   . Dysrhythmia   . Heart murmur   . Thrombocytopenia ([place]) 7/16  . Cancer ([place])     Mylenoma- top of head  . On home oxygen therapy     4 bliters  . Seasonal allergies   . 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 Heart Problems Brother   . Hypertension Brother   . Alzheimer's disease Mother   . Heart attack Father   . Thyroid disease Maternal Aunt   . Pulmonary Hypertension Cousin   . Heart Problems Maternal Aunt   . Thyroid disease Daughter   . Thyroid disease Daughter     General:  Healthy appearing patient in no distress.      VA: Acuity cc  [place]  OD CF at 6 feet    IOP:   15 mmHg OD    Pupils: normal  Exam:  External: Within normal limits      Anterior segment: Within normal limits      Fundus: Normal   Vitreous hemorrhage right eye   Heart: Regular rate and rhythm without murmur      Lungs: Clear to auscultation     Abdomen: Soft, nontender, normal bowel sounds     Impression:Non-clearing vitreous hemorrhage right eye - choroidal neovascular membrane with breakthrough bleeding and VMT  Plan: PPVx, endolaser, possible gas right eye  Reva Bores Kymani Shimabukuro   No change in examination.  Patient has non-clearing vitreous hemorrhage right eye with choroidal neovascular membrane and vitreomacular traction and breakthrough bleeding requiring PPVx, endolaser, gas right eye. Procedure confirmed with patient, consent signed and patient is ready for surgery.

## 2015-04-03 NOTE — Brief Op Note (Signed)
04/03/2015  5:06 PM  PATIENT:  Vicki [name]  74 y.o. 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  PROCEDURE:  Procedure(s): PARS PLANA VITRECTOMY WITH 25 GAUGE FOR VETREOUS HEMORRHAGE, ENDO LASER (Right), Air Fluid exchange  SURGEON:  Surgeon(s) and Role:    * Jalene Mullet, MD - Primary  PHYSICIAN ASSISTANT:   ASSISTANTS: none   ANESTHESIA:   local and MAC  EBL:  Total I/O In: 250 [I.V.:250] Out: -   BLOOD ADMINISTERED:none  DRAINS: none   LOCAL MEDICATIONS USED:  MARCAINE    and LIDOCAINE   SPECIMEN:  No Specimen  DISPOSITION OF SPECIMEN:  N/A  COUNTS:  YES  TOURNIQUET:  * No tourniquets in log *  DICTATION: .Note written in EPIC  PLAN OF CARE: Discharge to home after PACU  PATIENT DISPOSITION:  PACU - hemodynamically stable.   Delay start of Pharmacological VTE agent (>24hrs) due to surgical blood loss or risk of bleeding: no

## 2015-04-04 ENCOUNTER — Encounter (HOSPITAL_COMMUNITY): Payer: Self-pay | Admitting: Ophthalmology

## 2015-04-04 NOTE — Op Note (Signed)
Vicki [name] 04/04/2015 Diagnosis: Non-clearing vitreous hemorrhage, choroidal neovascular membrane, subretinal hemorrhage, vitreomacular traction right eye  Procedure: Pars Plana Vitrectomy, Endolaser and Fluid Gas Exchange Operative Eye:  right eye  Surgeon: Royston Cowper Estimated Blood Loss: minimal Specimens for Pathology:  None Complications: none   Time out confirmed the correct operative eye as the right eye.  Retrobulbar anesthesia was attained without complication.  The  patient was prepped and draped in the usual fashion for ocular surgery on the  right eye .  A lid speculum was placed.  Infusion line and trocar was placed at the 8 o'clock position approximately 3.5 mm from the surgical limbus.   The infusion line was allowed to run and then clamped when placed at the cannula opening. The line was inserted and secured to the drape with an adhesive strip.   Active trocars/cannula were placed at the 10 and 2 o'clock positions approximately 3.5 mm from the surgical limbus. The cannula was visualized in the vitreous cavity.  The light pipe and vitreous cutter were inserted into the vitreous cavity and a core vitrectomy was performed.  Care taken to remove the vitreous up to the vitreous base for 360 degrees.   Careful vitrectomy was performed over the macula due to the degree of traction and the extent of subretinal hemorrhage. Vitreomacular traction was noted and the vitrector was used to carefully sever the attachment to the overlying vitreous.  Old vitreous hemorrhage was carefully aspirated over the macula.  Additionally a small amount of subretinal hemorrhage was apirated using the soft tipped silicone canula.  Intraocular forceps were used to remove scar tissue over the macula.    Endocautery was used to expose the subretinal space in the inferior macula.  The silicone tipped canula was used to attempt to aspirate the subretinal hemorrhage without success.   Endolaser was  applied over the area of aspiration and then applied 360 degrees to the periphery.  An air-fluid exchange was performed.  The superior cannulas were sequentially removed with concommitant tamponade using a cotton tipped applicator and noted to be air tight.  The infusion line and trocar were removed and the sclerotomy was noted to be air tight with normal intraocular pressure by digital palpapation.  Subconjunctival injections of  Dexamethasone 74m/1ml was placed in the infero-medial quadrant.   The speculum and drapes were removed and the eye was patched with Polymixin/Bacitracin ophthalmic ointment. An eye shield was placed and the patient was transferred alert and conversant with stable vital signs to the post operative recovery area.  The patient tolerated the procedure well and no complications were noted.  NRoyston CowperMD

## 2015-04-05 ENCOUNTER — Other Ambulatory Visit (HOSPITAL_COMMUNITY): Payer: Self-pay | Admitting: *Deleted

## 2015-04-05 MED ORDER — TREPROSTINIL 0.6 MG/ML IN SOLN: 18.0000 ug | Freq: Four times a day (QID) | RESPIRATORY_TRACT | Status: DC

## 2015-04-08 ENCOUNTER — Other Ambulatory Visit (HOSPITAL_COMMUNITY): Payer: Self-pay | Admitting: Internal Medicine

## 2015-04-12 ENCOUNTER — Telehealth (HOSPITAL_COMMUNITY): Payer: Self-pay

## 2015-04-12 MED ORDER — TREPROSTINIL 0.6 MG/ML IN SOLN: 18.0000 ug | Freq: Four times a day (QID) | RESPIRATORY_TRACT | Status: DC

## 2015-04-12 NOTE — Telephone Encounter (Signed)
Pateints Tyzavo rx was sent to [facility] instead of [facility].  Re-ordered

## 2015-05-07 ENCOUNTER — Other Ambulatory Visit (HOSPITAL_COMMUNITY): Payer: Self-pay | Admitting: Internal Medicine

## 2015-05-07 DIAGNOSIS — H35051 Retinal neovascularization, unspecified, right eye: Secondary | ICD-10-CM | POA: Diagnosis not present

## 2015-05-07 DIAGNOSIS — H40013 Open angle with borderline findings, low risk, bilateral: Secondary | ICD-10-CM | POA: Diagnosis not present

## 2015-05-07 DIAGNOSIS — H35351 Cystoid macular degeneration, right eye: Secondary | ICD-10-CM | POA: Diagnosis not present

## 2015-05-07 DIAGNOSIS — H35073 Retinal telangiectasis, bilateral: Secondary | ICD-10-CM | POA: Diagnosis not present

## 2015-05-07 DIAGNOSIS — H25813 Combined forms of age-related cataract, bilateral: Secondary | ICD-10-CM | POA: Diagnosis not present

## 2015-05-07 DIAGNOSIS — H3561 Retinal hemorrhage, right eye: Secondary | ICD-10-CM | POA: Diagnosis not present

## 2015-05-08 ENCOUNTER — Other Ambulatory Visit (HOSPITAL_BASED_OUTPATIENT_CLINIC_OR_DEPARTMENT_OTHER): Payer: Medicare Other

## 2015-05-08 ENCOUNTER — Ambulatory Visit (HOSPITAL_BASED_OUTPATIENT_CLINIC_OR_DEPARTMENT_OTHER): Payer: Medicare Other | Admitting: Oncology

## 2015-05-08 VITALS — BP 117/54 | HR 61 | Temp 98.5°F | Resp 18 | Ht 62.5 in | Wt 169.7 lb

## 2015-05-08 DIAGNOSIS — D696 Thrombocytopenia, unspecified: Secondary | ICD-10-CM

## 2015-05-08 LAB — COMPREHENSIVE METABOLIC PANEL (CC13)
ALBUMIN: 3.8 g/dL (ref 3.5–5.0)
ALT: 11 U/L (ref 0–55)
ANION GAP: 9 meq/L (ref 3–11)
AST: 27 U/L (ref 5–34)
Alkaline Phosphatase: 38 U/L — ABNORMAL LOW (ref 40–150)
BUN: 16.2 mg/dL (ref 7.0–26.0)
CALCIUM: 9.5 mg/dL (ref 8.4–10.4)
CHLORIDE: 104 meq/L (ref 98–109)
CO2: 28 mEq/L (ref 22–29)
Creatinine: 1.1 mg/dL (ref 0.6–1.1)
EGFR: 59 mL/min/{1.73_m2} — AB (ref >90)
Glucose: 109 mg/dl (ref 70–140)
POTASSIUM: 3.6 meq/L (ref 3.5–5.1)
Sodium: 141 mEq/L (ref 136–145)
Total Bilirubin: 0.72 mg/dL (ref 0.20–1.20)
Total Protein: 7.4 g/dL (ref 6.4–8.3)

## 2015-05-08 LAB — CBC WITH DIFFERENTIAL/PLATELET
BASO%: 0.5 % (ref 0.0–2.0)
BASOS ABS: 0 10*3/uL (ref 0.0–0.1)
EOS ABS: 0.1 10*3/uL (ref 0.0–0.5)
EOS%: 1.2 % (ref 0.0–7.0)
HEMATOCRIT: 42.2 % (ref 34.8–46.6)
HEMOGLOBIN: 13.4 g/dL (ref 11.6–15.9)
LYMPH#: 1.6 10*3/uL (ref 0.9–3.3)
LYMPH%: 37.7 % (ref 14.0–49.7)
MCH: 31 pg (ref 25.1–34.0)
MCHC: 31.8 g/dL (ref 31.5–36.0)
MCV: 97.7 fL (ref 79.5–101.0)
MONO#: 0.5 10*3/uL (ref 0.1–0.9)
MONO%: 10.6 % (ref 0.0–14.0)
NEUT#: 2.1 10*3/uL (ref 1.5–6.5)
NEUT%: 50 % (ref 38.4–76.8)
PLATELETS: 116 10*3/uL — AB (ref 145–400)
RBC: 4.32 10*6/uL (ref 3.70–5.45)
RDW: 14.3 % (ref 11.2–14.5)
WBC: 4.2 10*3/uL (ref 3.9–10.3)

## 2015-05-08 LAB — CHCC SMEAR

## 2015-05-08 NOTE — Progress Notes (Signed)
Hematology and Oncology Follow Up Visit  Vicki [name] [id number] 18-Jan-1941 74 y.o. 05/08/2015 1:35 PM Dwan Bolt, Vicki [name], Vicki Jew, MD   Principle Diagnosis: 74 year old woman with thrombocytopenia. Her platelet count fluctuated since 2013 but for the most part have been under 100,000.   Current therapy: Observation and surveillance.  Interim History: Vicki [name] is as today for a follow-up visit. Since the last visit, she underwent an eye operation on 04/03/2015 and tolerated it well. She did not have any bleeding complications afterwards. And her vision is improving slowly. She has not reported any bruising or other bleeding issues. Her performance status activity level is unchanged.  She does not report any headaches, blurry vision, syncope or seizures. She does not report any fevers, chills, sweats or weight loss. She does not report any chest pain, palpitation orthopnea. She does not report any cough or hemoptysis or hematemesis. Does not report any nausea, vomiting, abdominal pain. She does not report any change in her bowel habits. She does not report any frequency urgency or hesitancy. She does not report any skeletal complaints. Remaining review of systems unremarkable.   Medications: I have reviewed the patient's current medications.  Current Outpatient Prescriptions  Medication Sig Dispense Refill  . acetaminophen (TYLENOL) 500 MG tablet Take 500 mg by mouth every 6 (six) hours as needed.    Marland Kitchen amLODipine (NORVASC) 2.5 MG tablet TAKE ONE TABLET BY MOUTH ONCE DAILY 30 tablet 3  . KLOR-CON M20 20 MEQ tablet TAKE TWO TABLETS BY MOUTH IN THE MORNING AND ONE IN THE EVENING 90 tablet 3  . Macitentan (OPSUMIT) 10 MG TABS Take 1 tablet by mouth daily with breakfast. 30 tablet 6  . ofloxacin (OCUFLOX) 0.3 % ophthalmic solution As directed    . prednisoLONE acetate (PRED FORTE) 1 % ophthalmic suspension As directed    . rosuvastatin (CRESTOR) 20 MG tablet Take 20 mg by mouth at bedtime.      . Tadalafil, PAH, 20 MG TABS Take 2 tablets (40 mg total) by mouth daily. 180 tablet 3  . torsemide (DEMADEX) 20 MG tablet TAKE ONE & ONE-HALF TABLETS BY MOUTH TWICE DAILY 90 tablet 0  . Treprostinil (TYVASO) 0.6 MG/ML SOLN Inhale 18 mcg into the lungs 4 (four) times daily. 3.6 mL 3  . warfarin (COUMADIN) 5 MG tablet Take 2.5-5 mg by mouth daily. Takes 1/2 tablet on Monday, Wednesday and Friday and 1 tablet all other days     No current facility-administered medications for this visit.     Allergies: No Known Allergies  Past Medical History, Surgical history, Social history, and Family History were reviewed and updated.   Physical Exam: Blood pressure 117/54, pulse 61, temperature 98.5 F (36.9 C), temperature source Oral, resp. rate 18, height 5' 2.5" (1.588 m), weight 169 lb 11.2 oz (76.975 kg), SpO2 96 %. ECOG: 0 General appearance: alert and cooperative Head: Normocephalic, without obvious abnormality Neck: no adenopathy Lymph nodes: Cervical, supraclavicular, and axillary nodes normal. Heart:regular rate and rhythm, S1, S2 normal, no murmur, click, rub or gallop Lung:chest clear, no wheezing, rales, normal symmetric air entry Abdomin: soft, non-tender, without masses or organomegaly EXT:no erythema, induration, or nodules   Lab Results: Lab Results  Component Value Date   WBC 4.2 05/08/2015   HGB 13.4 05/08/2015   HCT 42.2 05/08/2015   MCV 97.7 05/08/2015   PLT 116* 05/08/2015     Chemistry      Component Value Date/Time   NA 137 04/03/2015 1406   NA  142 06/13/2012 1345   K 4.6 04/03/2015 1406   K 3.5 06/13/2012 1345   CL 100* 04/03/2015 1406   CL 105 06/13/2012 1345   CO2 25 04/03/2015 1406   CO2 29 06/13/2012 1345   BUN 11 04/03/2015 1406   BUN 16.0 06/13/2012 1345   CREATININE 0.89 04/03/2015 1406   CREATININE 1.0 06/13/2012 1345      Component Value Date/Time   CALCIUM 8.8* 04/03/2015 1406   CALCIUM 8.9 06/13/2012 1345   ALKPHOS 44 05/29/2014 1416    ALKPHOS 45 06/13/2012 1345   AST 38* 05/29/2014 1416   AST 34 06/13/2012 1345   ALT 15 05/29/2014 1416   ALT 21 06/13/2012 1345   BILITOT 0.5 05/29/2014 1416   BILITOT 1.69* 06/13/2012 1345        Impression and Plan:   74 year old woman with the following issues:  1. Thrombocytopenia that has been mild and fluctuating since 2013. Her peripheral smear did show occasional platelet clumping which could be contributing to her mild thrombocytopenia. I see no sign of any hematological disorder at this time and no intervention is needed. Her platelet count is more than adequate at this time and her risk of bleeding is negligible.  The rest of her CBC and laboratory testing is all within normal range. I recommended no further hematological workup or follow-up at this time unless things change in the future. If her platelet counts drop in the future I'll be more than happy to reevaluate her at that time.  2. History of cardiomyopathy: Followed by cardiology.  3. Follow-up: Will be as needed.   LTEIHD,TPNSQ, MD 11/9/20161:35 PM

## 2015-05-13 DIAGNOSIS — H35351 Cystoid macular degeneration, right eye: Secondary | ICD-10-CM | POA: Diagnosis not present

## 2015-05-13 DIAGNOSIS — H35051 Retinal neovascularization, unspecified, right eye: Secondary | ICD-10-CM | POA: Diagnosis not present

## 2015-05-13 DIAGNOSIS — H35072 Retinal telangiectasis, left eye: Secondary | ICD-10-CM | POA: Diagnosis not present

## 2015-05-13 DIAGNOSIS — H35071 Retinal telangiectasis, right eye: Secondary | ICD-10-CM | POA: Diagnosis not present

## 2015-05-13 DIAGNOSIS — H3561 Retinal hemorrhage, right eye: Secondary | ICD-10-CM | POA: Diagnosis not present

## 2015-05-14 ENCOUNTER — Ambulatory Visit (INDEPENDENT_AMBULATORY_CARE_PROVIDER_SITE_OTHER): Payer: Medicare Other | Admitting: *Deleted

## 2015-05-14 DIAGNOSIS — Z5181 Encounter for therapeutic drug level monitoring: Secondary | ICD-10-CM | POA: Diagnosis not present

## 2015-05-14 DIAGNOSIS — I482 Chronic atrial fibrillation, unspecified: Secondary | ICD-10-CM

## 2015-05-14 DIAGNOSIS — I4891 Unspecified atrial fibrillation: Secondary | ICD-10-CM

## 2015-05-14 LAB — POCT INR: INR: 2.6

## 2015-05-30 DIAGNOSIS — Z23 Encounter for immunization: Secondary | ICD-10-CM | POA: Diagnosis not present

## 2015-06-12 ENCOUNTER — Other Ambulatory Visit (HOSPITAL_COMMUNITY): Payer: Self-pay | Admitting: *Deleted

## 2015-06-12 DIAGNOSIS — I272 Pulmonary hypertension, unspecified: Secondary | ICD-10-CM

## 2015-06-12 MED ORDER — TADALAFIL (PAH) 20 MG PO TABS: 40.0000 mg | ORAL_TABLET | Freq: Every day | ORAL | Status: DC

## 2015-06-14 ENCOUNTER — Telehealth (HOSPITAL_COMMUNITY): Payer: Self-pay | Admitting: *Deleted

## 2015-06-14 MED ORDER — MACITENTAN 10 MG PO TABS: 1.0000 | ORAL_TABLET | Freq: Every day | ORAL | Status: DC

## 2015-06-14 NOTE — Telephone Encounter (Signed)
Pt needed refill for Opsumit, order sent in

## 2015-06-18 ENCOUNTER — Ambulatory Visit (HOSPITAL_COMMUNITY)
Admission: RE | Admit: 2015-06-18 | Discharge: 2015-06-18 | Disposition: A | Discharge status: Home / Self Care | Payer: Medicare Other | Source: Ambulatory Visit | Attending: Cardiology | Admitting: Cardiology

## 2015-06-18 VITALS — BP 141/74 | HR 70 | Ht 62.0 in | Wt 171.8 lb

## 2015-06-18 DIAGNOSIS — E785 Hyperlipidemia, unspecified: Secondary | ICD-10-CM | POA: Diagnosis not present

## 2015-06-18 DIAGNOSIS — I272 Other secondary pulmonary hypertension: Secondary | ICD-10-CM | POA: Diagnosis not present

## 2015-06-18 DIAGNOSIS — I110 Hypertensive heart disease with heart failure: Secondary | ICD-10-CM | POA: Insufficient documentation

## 2015-06-18 DIAGNOSIS — Z7901 Long term (current) use of anticoagulants: Secondary | ICD-10-CM | POA: Diagnosis not present

## 2015-06-18 DIAGNOSIS — I482 Chronic atrial fibrillation, unspecified: Secondary | ICD-10-CM

## 2015-06-18 DIAGNOSIS — I5032 Chronic diastolic (congestive) heart failure: Secondary | ICD-10-CM | POA: Insufficient documentation

## 2015-06-18 LAB — LIPID PANEL
CHOL/HDL RATIO: 2.9 ratio
Cholesterol: 143 mg/dL (ref 0–200)
HDL: 50 mg/dL (ref >40)
LDL CALC: 54 mg/dL (ref 0–99)
TRIGLYCERIDES: 194 mg/dL — AB (ref <150)
VLDL: 39 mg/dL (ref 0–40)

## 2015-06-18 LAB — BRAIN NATRIURETIC PEPTIDE: B Natriuretic Peptide: 628.2 pg/mL — ABNORMAL HIGH (ref 0.0–100.0)

## 2015-06-18 NOTE — Progress Notes (Signed)
6 min walk test completed:  Pt ambulated 900 ft (274 m) 

## 2015-06-18 NOTE — Patient Instructions (Signed)
Labs today  Your physician has requested that you have an echocardiogram. Echocardiography is a painless test that uses sound waves to create images of your heart. It provides your doctor with information about the size and shape of your heart and how well your heart's chambers and valves are working. This procedure takes approximately one hour. There are no restrictions for this procedure.  IN 4 MONTHS  We will contact you in 4 months to schedule your next appointment.  I am working on your Tyvaso co-pay, I will let you know when I know more information

## 2015-06-20 NOTE — Progress Notes (Signed)
Patient ID: Vicki [name], female   DOB: 01/29/1941, 74 y.o.   MRN: [id number] PCP: Dr. Wilson Singer  Vicki [name] is a 74 yo with history of chronic diastolic CHF and chronic atrial fibrillation presents for followup of pulmonary hypertension.  Patient was followed by Dr. Glade Lloyd in the past for chronic atrial fibrillation.  She has been on coumadin.  She reports progressive exertional dyspnea since 2011.  This gradually worsened and became quite significant over the last few months.  She used to have significant HTN, but more recently her BP has been on the lower side.  Echo was done in 2/14, showing severe concentric LVH with EF 55-60%, moderately dilated RV, moderate to severe TR, and PA systolic pressure 86 mmHg.  I did a right heart cath in 5/14.  This showed PA pressure 104/36 with PCWP 20, suggesting pulmonary arterial HTN well out of proportion to the mildly elevate wedge pressure.  She was already on amlodipine so I did not do vasodilator testing.  V/Q scan was done, showing no evidence for chronic PEs.  PFTs showed a restrictive defect. Cardiac MRI did not show definite evidence for amyloid.  I started her on macitentan 10 mg daily.  Initially, she felt better on macitentan.  However, she was admitted in 5/14 from her sleep study due to orthopnea and dyspnea.  She was diuresed for several days and diuretic was switched over to torsemide.  I next started her on tadalafil 20 mg daily and titrated up to 40 mg daily.  She thinks that this helped.  She saw pulmonary after chest CT (showed mosaic attenuation in lungs).  This was thought to be due to air-trapping rather than ILD.  She was started on Spiriva. She wears oxygen at home.   She had an echocardiogram in 2/15 that showed severe LVH, EF 75%, small pericardial effusion, mildly dilated RV with mildly decreased systolic function, PA systolic pressure 84 mmHg.  She is not totally sure if she was taking macitentan and tadalafil at that time.  She has had a hard  month.  She ran out of her macitentan and tadalafil and her insurance company refused to refill them.  After this, she took a decided turn for the worse.  [place] in [place].  She became much more short of breath, just with walking around the house. She developed lightheaded spells, especially with micturation.  Given lightheadedness and presyncope, she was admitted last week.  She was restarted on her medications and she finally got back on her meds at home.  In the hospital, she was noted to be bradycardic with HR to 30s so metoprolol was stopped.  Of note, her abdominal fat pad biopsy did not suggest amyloidosis. Repeat RHC in 3/15 still with moderate to severe PAH and low CI.  Holter off beta blocker in 3/15 showed average HR 73.   She presents today for HF/PAH follow up. On Tyvaso, tadalafil, and macitentan for pulmonary hypertension. Weight up 3 lbs. No chest pain, lightheadedness, syncope.  No orthopnea/PND.  Has no problem with steps and can walk as long as she wants on flat ground. Using oxygen with exertion and at night.  She cooks and cleans and does some yardwork. Dyspnea with vacuuming.  She is concerned that Tyvaso is going to be too expensive this coming year.   6 minute walk (5/14): 122 m.   6 minute walk (7/14): 152  m 6 minute walk (10/14): 183 m 6 minute walk (4/15): 317 m 6 minute walk (12/15): 259 m 6 minute walk (4/16): 293 m 6 minute walk (8/16): 341 m 6 minute walk (12/16): 274 m  Labs (12/13): HCT 45.1, plts 153, K 3.5, creatinine 1.0 Labs (1/14): BNP 849 Labs (4/14): K 3.1, creatinine 1.0, ANA and anti-SCL-70 antibody negative.  Rheumatoid factor negative.  Serum immunofixation did not show monoclonal light chains.  Labs (5/14): K 3.3, creatinine 0.79, proBNP 5147 Labs (6/14): K 4, creatinine 0.9, BNP 1001 Labs (10/14): LDL 53, LDL-P 975, K 3.7, creatinine 1.2 Labs (11/14): K 3.7, creatinine 0.9,  BNP 832 Labs (2/15): K 3.5, creatinine 1.10, HCT 42.6, UPEP negative, SPEP negative.  Labs (3/15): K 3.8, creatinine 0.88 Labs (4/15): K 3.7, creatinine 0.99, proBNP 1653 Labs (7/15): K 4, creatinine 0.93 Labs (12/15): LDL 77, HDL 58, K 3.9, creatinine 1.03, LFTs normal Labs (4/16): K 3.8, creatinine 0.93, BNP 475, plts 105, HCT 42.3 Labs (11/16): K 3.6, creatinine 1.1, HCT 42.2  PMH: 1. Chronic diastolic CHF: Echo (2/26) with EF 55-60%, severe LVH (no SAM, no asymmetric hypertrophy, no LVOT gradient), moderate-severe LAE, moderately dilated RV with mildly decreased systolic function, moderate to severe RAE, PA systolic pressure 86 mmHg, moderate-severe TR, moderate MR, trivial pericardial effusion.  Cardiac MRI (5/14): EF 65%, severe LVH, no definite evidence for amyloidosis (no delayed enhancement, myocardium not difficult to null).  Echo (2/15) with EF 75%, severe LVH, grade II diastolic dysfunction, moderate MR, RV mildly dilated with mildly decreased systolic function, moderate TR, PA systolic pressure 84 mmHg, small pericardial effusion. Abdominal fat pad biopsy (2/15) showed no evidence for amyloidosis.  SPEP/UPEP negative 2/15. Echo (4/16) with EF 60-65%, severe LVH, RV mildly dilated with mildly decreased systolic function, PA systolic pressure 40 mmHg.  2. Chronic atrial fibrillation since around 2004.  Developed bradycardia and metoprolol stopped 2/15. Holter (3/15) with average HR 73, atrial fibrillation, 3.8 sec pause x 1 while asleep, PVCs.  3. HTN: For decades.  4. LHC (2/08) with no significant disease.  5. Chronic thrombocytopenia: ITP 6. Pulmonary arterial HTN: RHC (5/14) with mean RA 13, PA 104/36 (mean 63), mean PCWP 20 on right and 23 on left, CI 2.3 (Fick) and 1.6 (thermo), PVR 10.4 WU (Fick) and 15 WU (thermo).  Vasodilator testing not done as patient was already on amlodipine.  V/Q scan (5/14) with no evidence for chronic PE.  ANA, RF, and anti-SCL70 antibody negative.  PFTs  (5/14) with FEV1 60%, FVC 54%, ratio 112%, TLC 61%, DLCO 43% => restrictive defect.  Sleep study (7/14) with no OSA.  CT chest with areas of mosaic attenuation in lungs (saw pulmonary, thought air trapping and not ILD). RHC (3/15) with RA mean 5, PA 66/31 mean 43, PCWP mean 11, Cardiac Index (Fick) 1.97, PVR 9.2 WU.  Patient was started on Tyvaso in 3/15.  Echo (4/16) with mildly dilated and mildly dysfunctional RV, PA systolic pressure 40 mmHg.   SH: Married, retired from a bank, 3 children, lives in [place].   FH: No heart disease, +HTN.  ROS: All systems reviewed and negative except as per HPI.   Current Outpatient Prescriptions  Medication Sig Dispense Refill  . acetaminophen (TYLENOL) 500 MG tablet Take 500 mg by mouth every 6 (six) hours as needed.    Marland Kitchen amLODipine (NORVASC) 2.5 MG tablet TAKE ONE TABLET BY MOUTH ONCE DAILY 30 tablet 3  . KLOR-CON M20 20 MEQ tablet TAKE TWO TABLETS BY  MOUTH IN THE MORNING AND ONE IN THE EVENING 90 tablet 3  . Macitentan (OPSUMIT) 10 MG TABS Take 1 tablet by mouth daily with breakfast. 30 tablet 6  . rosuvastatin (CRESTOR) 20 MG tablet Take 20 mg by mouth at bedtime.     . Tadalafil, PAH, 20 MG TABS Take 2 tablets (40 mg total) by mouth daily. 180 tablet 3  . torsemide (DEMADEX) 20 MG tablet TAKE ONE & ONE-HALF TABLETS BY MOUTH TWICE DAILY 90 tablet 5  . Treprostinil (TYVASO) 0.6 MG/ML SOLN Inhale 18 mcg into the lungs 4 (four) times daily. 3.6 mL 3  . warfarin (COUMADIN) 5 MG tablet Take 2.5-5 mg by mouth daily. Takes 1/2 tablet on Monday, Wednesday and Friday and 1 tablet all other days     No current facility-administered medications for this encounter.    BP 141/74 mmHg  Pulse 70  Ht _0  (1.575 m)  Wt 171 lb 12.8 oz (77.928 kg)  BMI 31.41 kg/m2  SpO2 97% General: NAD Neck: JVP 7-8 cm, no thyromegaly or thyroid nodule.  Lungs: Slight dry crackles at bases bilaterally otherwise clear CV: Nondisplaced PMI.  Heart irregular S1/S2, 2/6 HSM  apex.  No edema.  No carotid bruit.  Normal pedal pulses.  Abdomen: Soft, nontender, no hepatosplenomegaly, no distention.  Neurologic: Alert and oriented x 3.  Psych: Normal affect. Extremities: No clubbing or cyanosis.   Assessment/Plan: 1. Pulmonary HTN: Patient has severe pulmonary arterial HTN.  Last RHC in 3/15 showed CI 1.97, PVR 9.  It is possible that long-standing PCWP elevation from diastolic LV dysfunction could lead to pulmonary vascular changes with pulmonary arterial hypertension out of proportion to the PCWP elevation.  However, suspect idiopathic primary pulmonary HTN (Group 1). Collagen vascular disease workup was negative (negative RF, ANA and negative anti-SCL-70).  V/Q scan was not suggestive of chronic PEs.  PFTs were suggestive of restrictive lung disease but CT chest and evaluation by pulmonary did not suggest interstitial lung disease.  Sleep study did not show OSA.  Last echo in 4/16 showed mildly dysfunctional RV with PA systolic pressure 43 mmHg.  6 minute walk today was worse than prior though she is symptomatically stable.   - Continue oxygen at night and with exertion as needed  - Continue Tyvaso, macitentan, Adcirca for now.  However, given fall in 6 minute walk and expense of Tyvaso, will look into whether she would be able to get Selexipag for reasonable pricing.  If so, would plan to titrate her off Tyvaso and onto Selexipag.    - Repeat echo in 4/17 to reassess RV.  - Check BNP today.  2. Chronic diastolic CHF: EF preserved on echo with severe concentric LVH and a small pericardial effusion.  It is possible that the LVH is due to years of HTN.  The cardiac MRI was not definitively suggestive of cardiac amyloidosis.  I was still concerned for amyloidosis given the appearance of the LV myocardium on echoes.  SPEP/UPEP negative.  Abdominal fat pad biopsy showed no evidence for amyloidosis.  - Continue current torsemide for now, volume not markedly up.  3. HTN: BP  stable, SBP in 120s at home.  Can continue current dose of amlodipine.  4. Chronic atrial fibrillation: Continue coumadin.  She is off metoprolol due to bradycardia. Holter off metoprolol did not show any high rate episodes.   5. Hyperlipidemia: On Crestor, check lipids today.   Followup in 4 months with echo.  [name]  06/20/2015

## 2015-06-27 ENCOUNTER — Ambulatory Visit (INDEPENDENT_AMBULATORY_CARE_PROVIDER_SITE_OTHER): Payer: Medicare Other | Admitting: *Deleted

## 2015-06-27 DIAGNOSIS — I482 Chronic atrial fibrillation, unspecified: Secondary | ICD-10-CM

## 2015-06-27 DIAGNOSIS — I4891 Unspecified atrial fibrillation: Secondary | ICD-10-CM

## 2015-06-27 DIAGNOSIS — Z5181 Encounter for therapeutic drug level monitoring: Secondary | ICD-10-CM

## 2015-06-27 DIAGNOSIS — I739 Peripheral vascular disease, unspecified: Secondary | ICD-10-CM | POA: Diagnosis not present

## 2015-06-27 DIAGNOSIS — E789 Disorder of lipoprotein metabolism, unspecified: Secondary | ICD-10-CM | POA: Diagnosis not present

## 2015-06-27 LAB — POCT INR: INR: 2.2

## 2015-07-04 DIAGNOSIS — I509 Heart failure, unspecified: Secondary | ICD-10-CM | POA: Diagnosis not present

## 2015-07-04 DIAGNOSIS — E789 Disorder of lipoprotein metabolism, unspecified: Secondary | ICD-10-CM | POA: Diagnosis not present

## 2015-07-22 ENCOUNTER — Other Ambulatory Visit (HOSPITAL_COMMUNITY): Payer: Self-pay | Admitting: Cardiology

## 2015-07-28 IMAGING — CR DG CHEST 2V
3 series · 3 of 3 positions shown · non-contrast
Comparison: 02/01/2013

CLINICAL DATA: Dizziness, congestive heart failure

EXAM:
CHEST  2 VIEW

[w chest pa (1 of 2)]
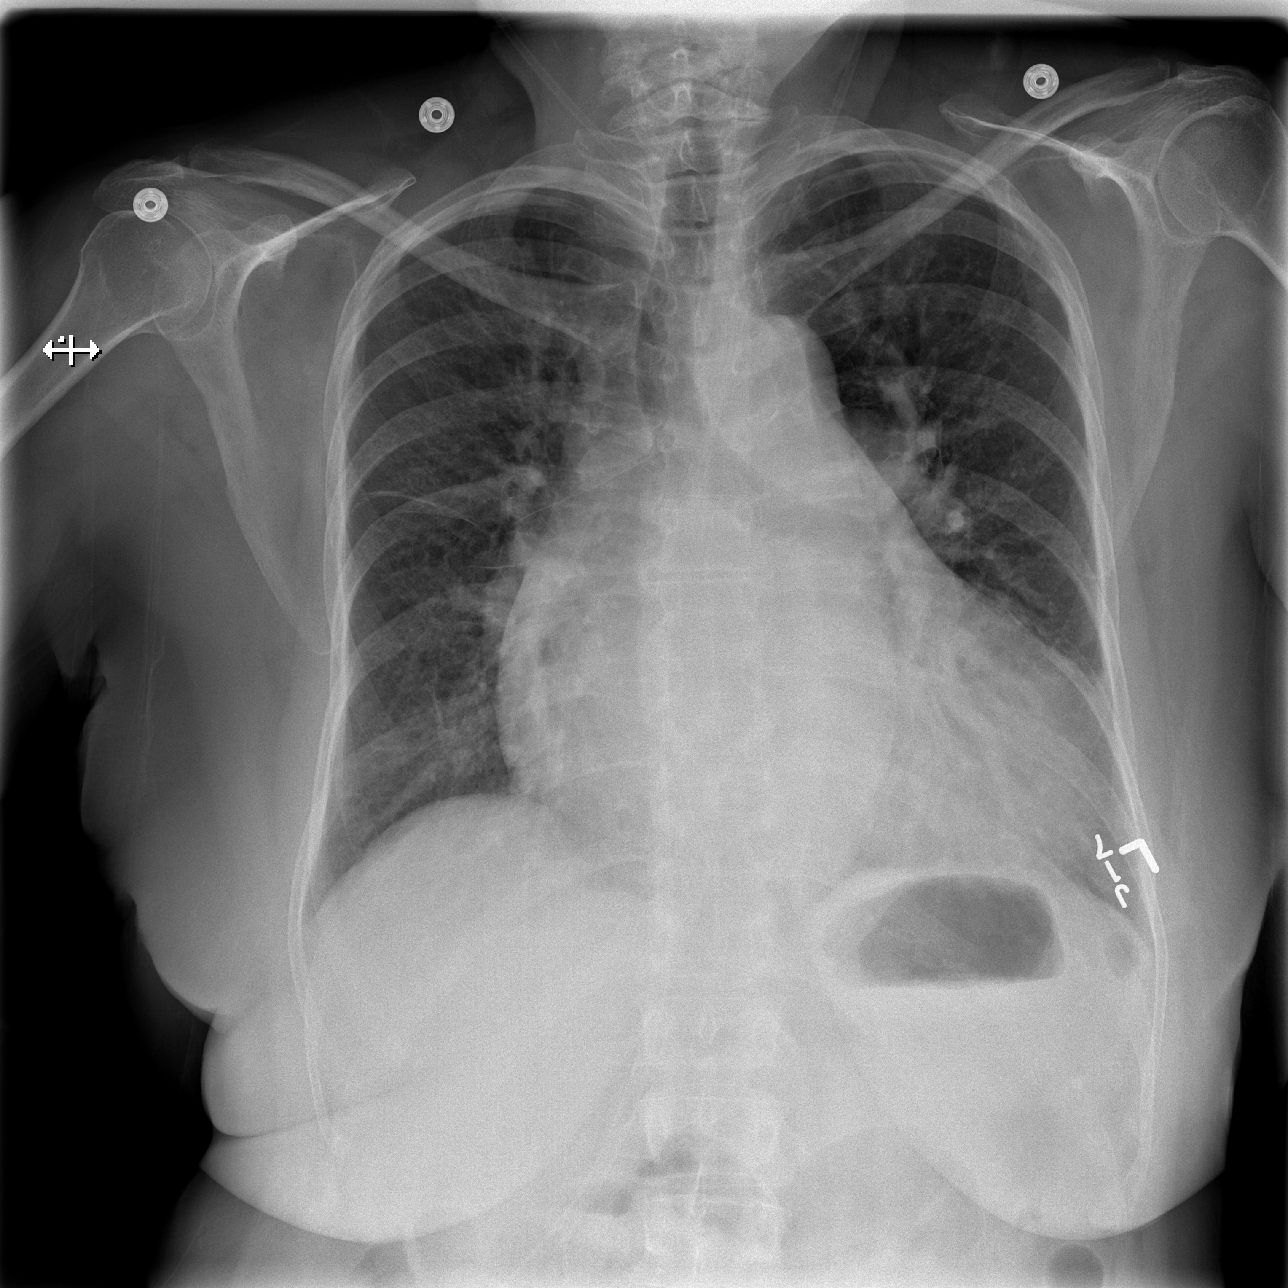

[w chest pa (2 of 2)]
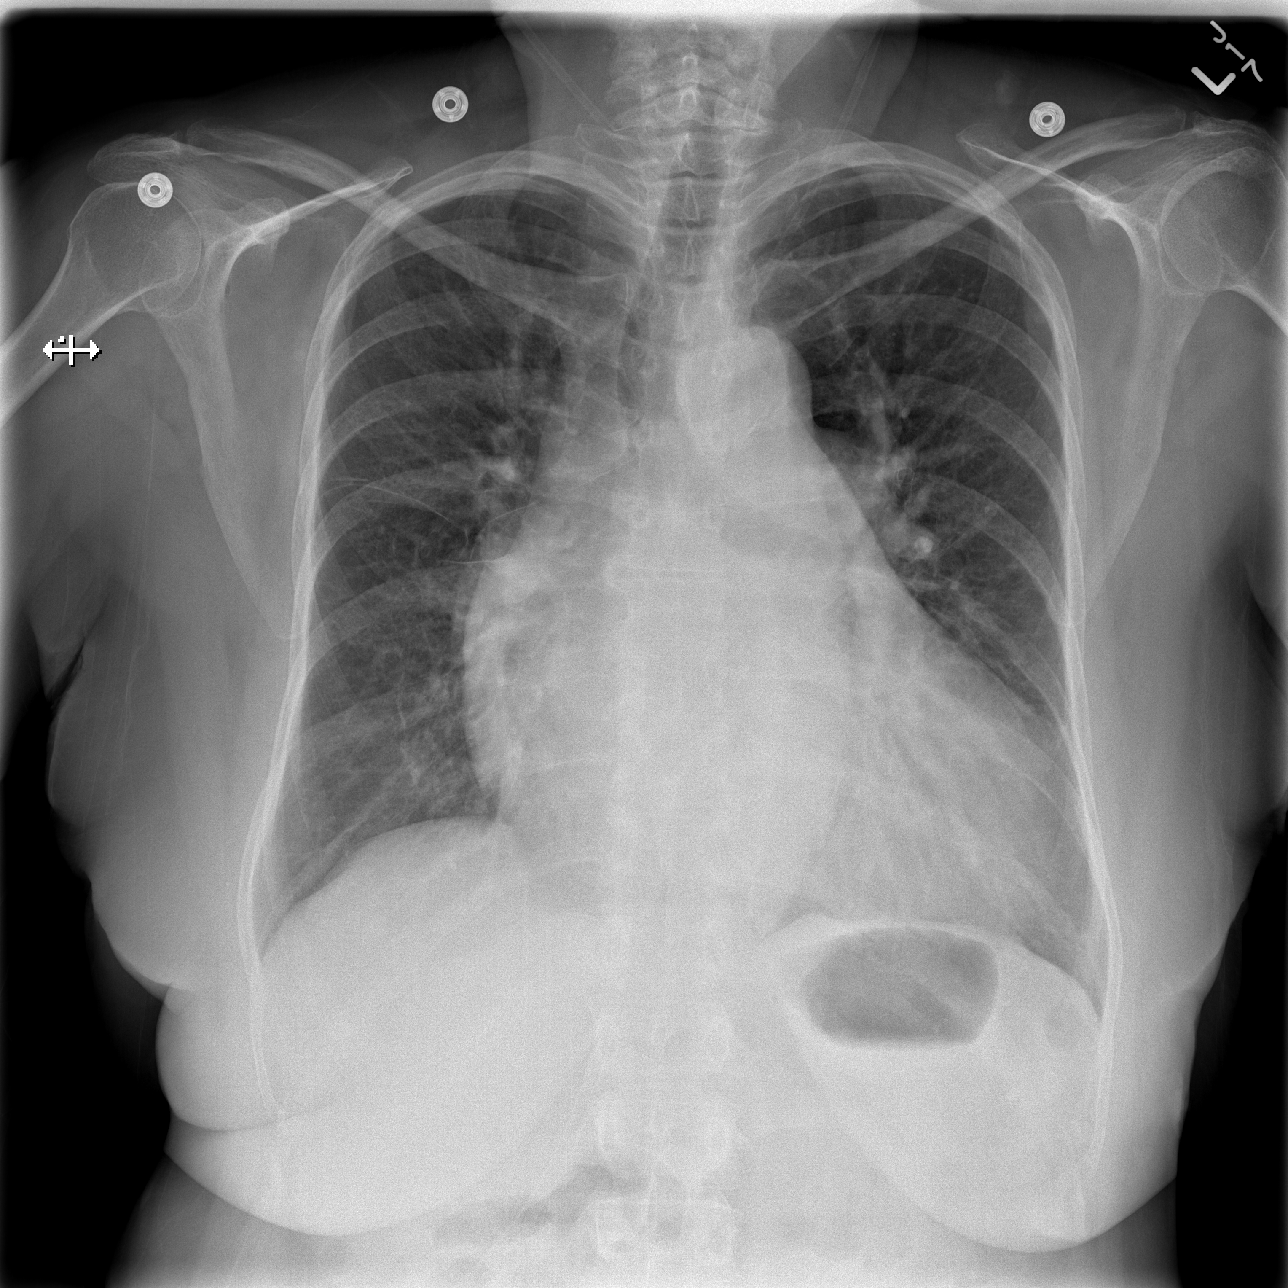

[w chest lat]
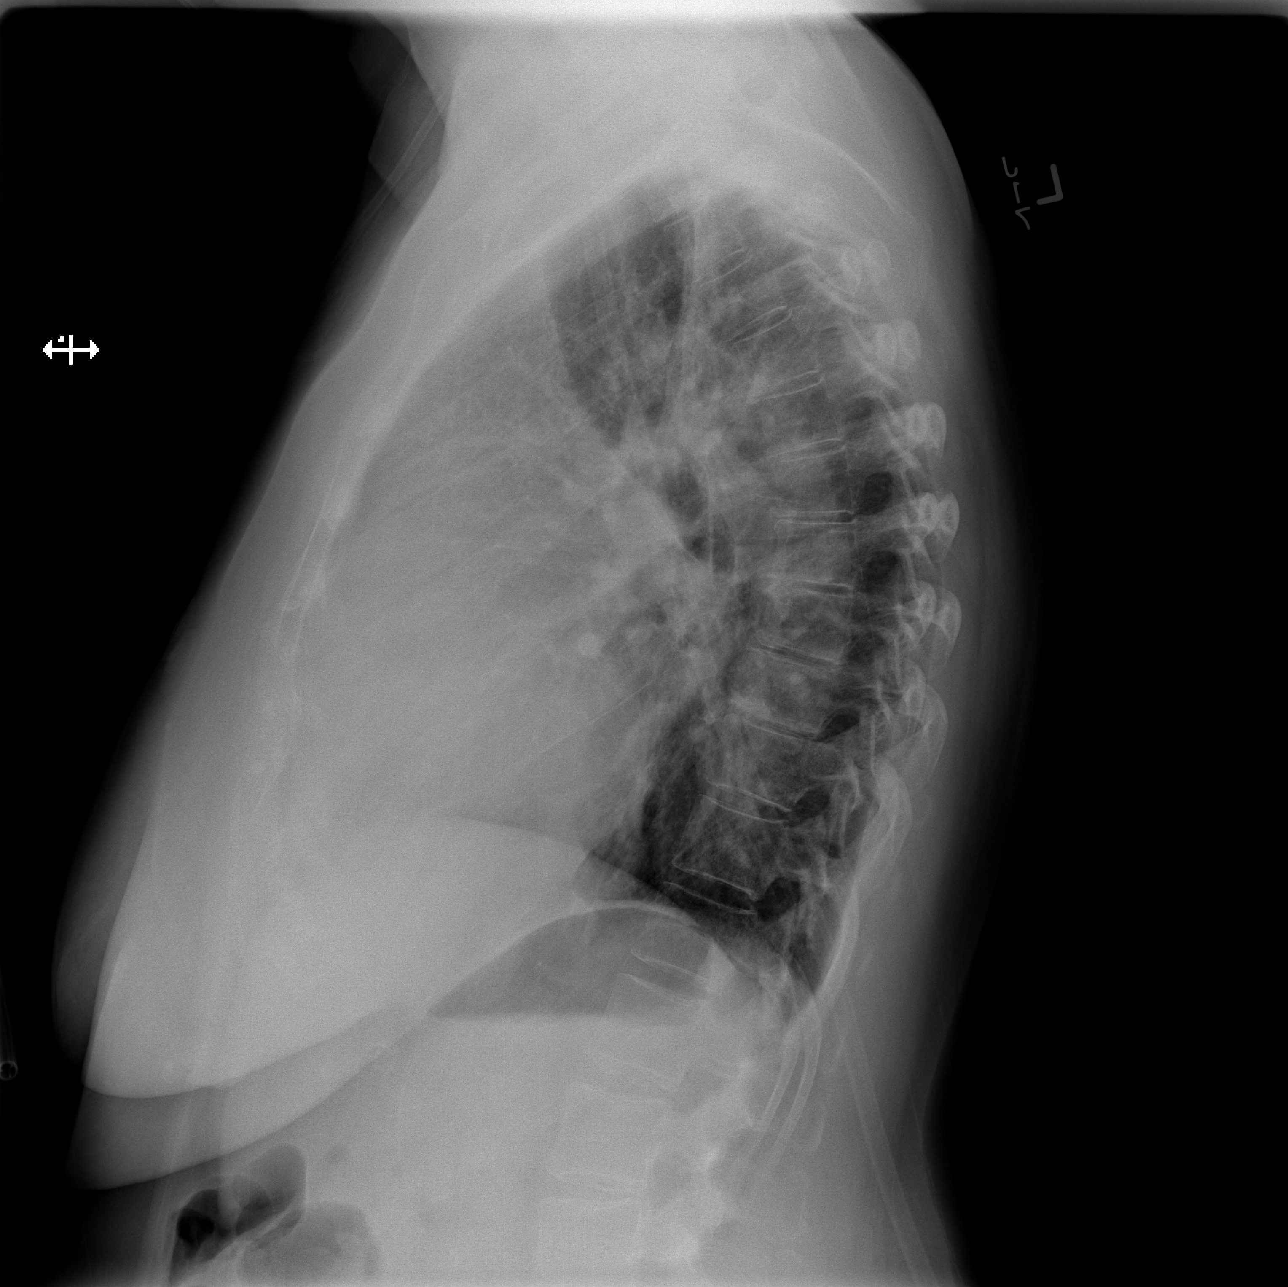

[3 of 3 positions shown; findings below may reference images not displayed]

FINDINGS: Cardiomegaly is noted. Central vascular congestion without
convincing pulmonary edema. No segmental infiltrate. Mild
degenerative changes mid thoracic spine.
IMPRESSION: Cardiomegaly. Central vascular congestion without convincing
pulmonary edema.

## 2015-07-30 IMAGING — US US BIOPSY
1 series · 7 of 7 positions shown · non-contrast
Comparison: none

CLINICAL DATA: 72-year-old female with a possible history of
amyloidosis. Ultrasound-guided random biopsy of the abdominal fat
pad is warranted to evaluate for amyloid deposits.

[Series 1: us biopsy · 0.12mm/px · 7 of 7 slices shown]
[im 1/7]
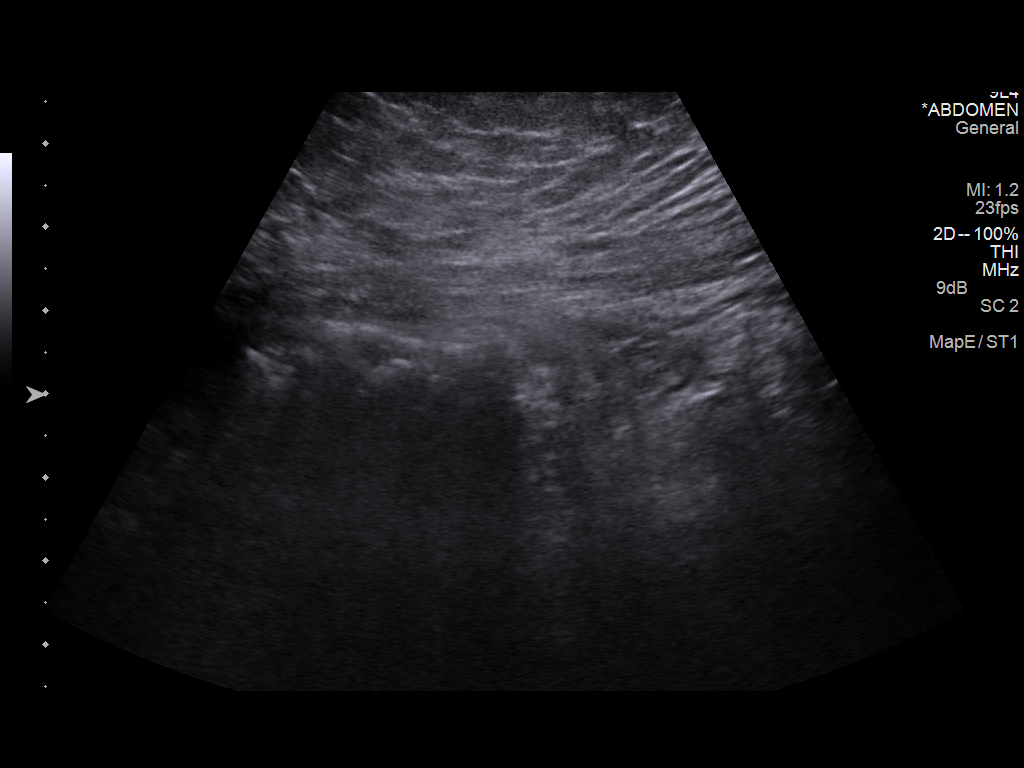
[im 2/7]
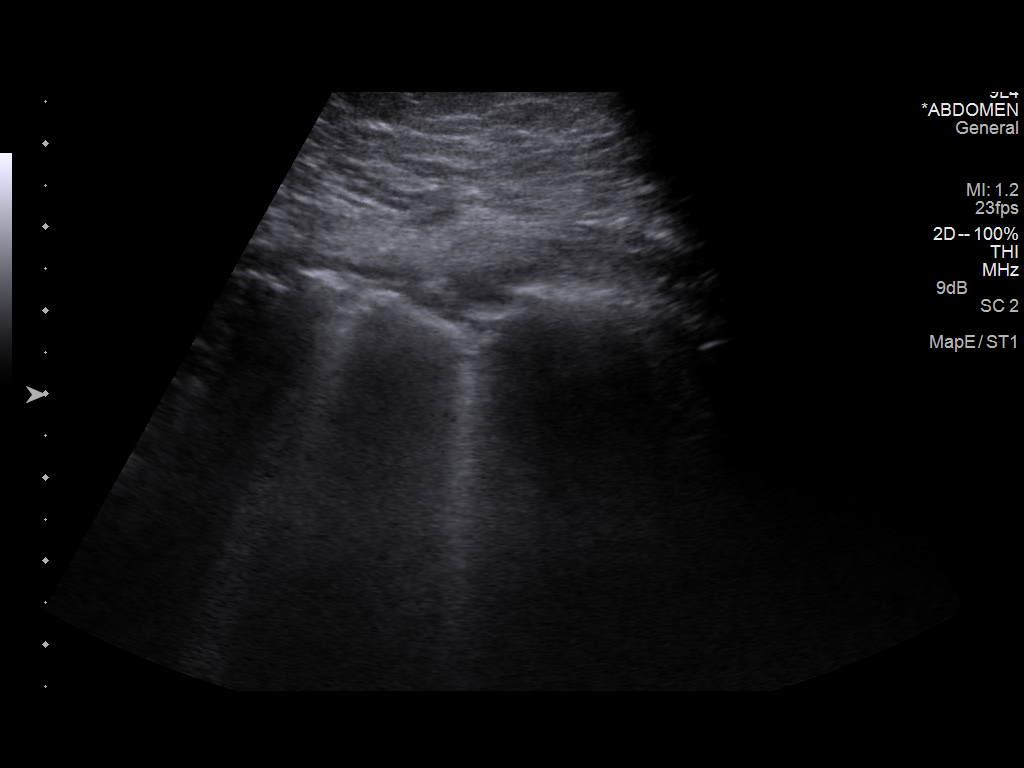
[im 3/7]
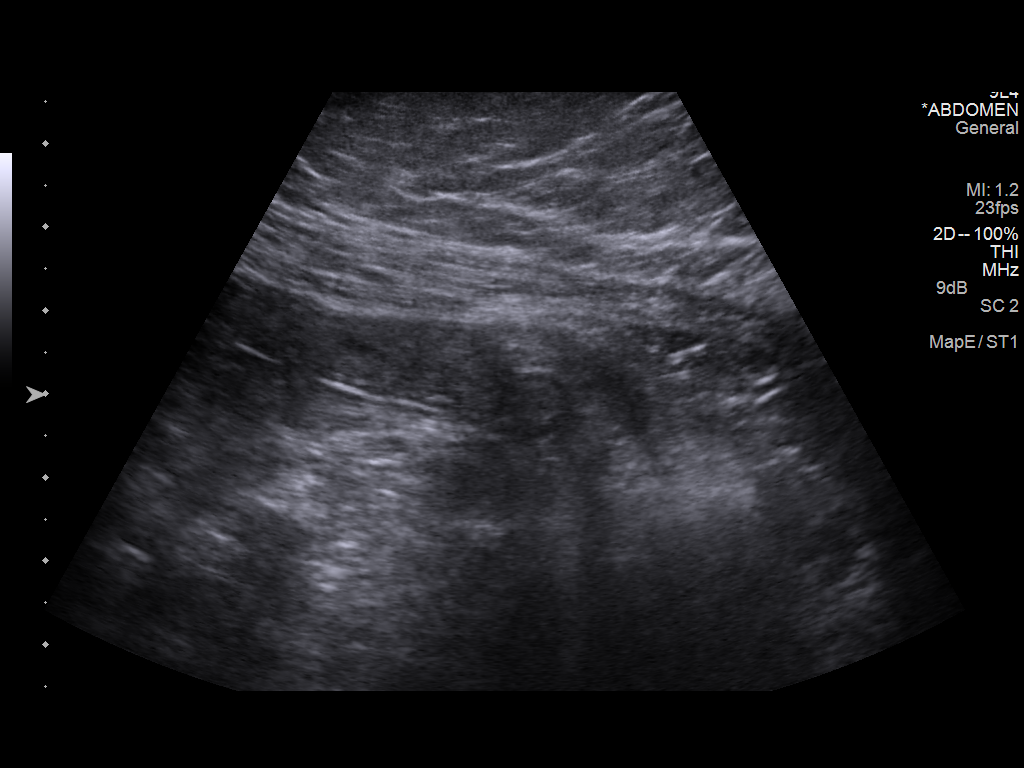
[im 4/7]
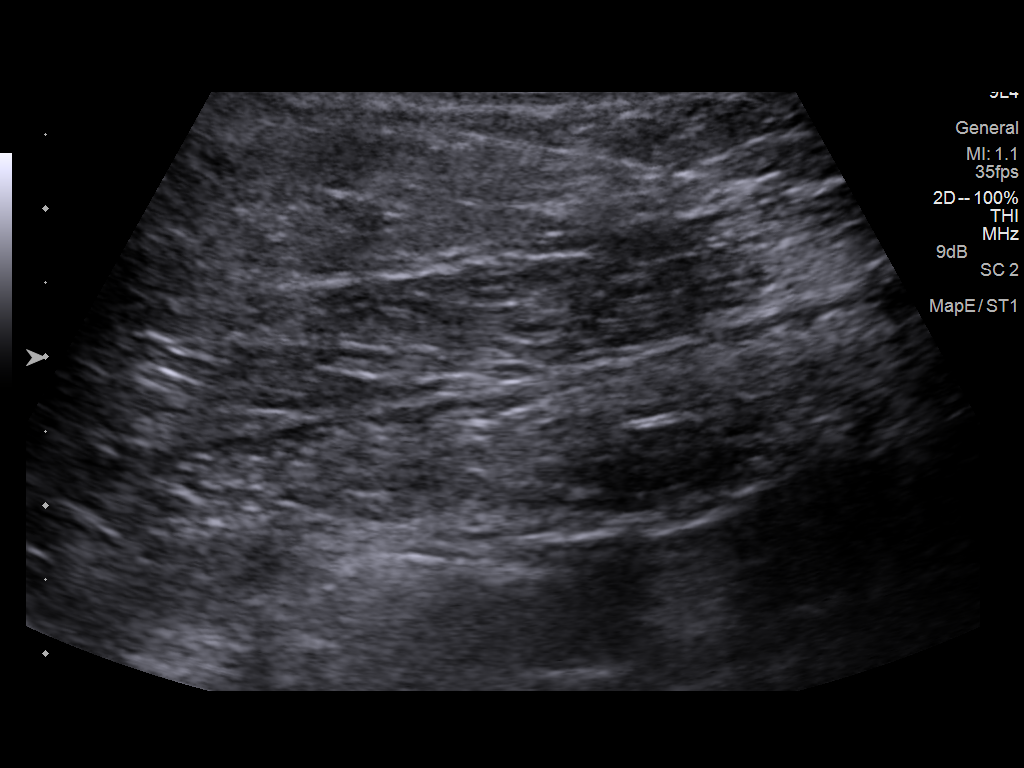
[im 5/7]
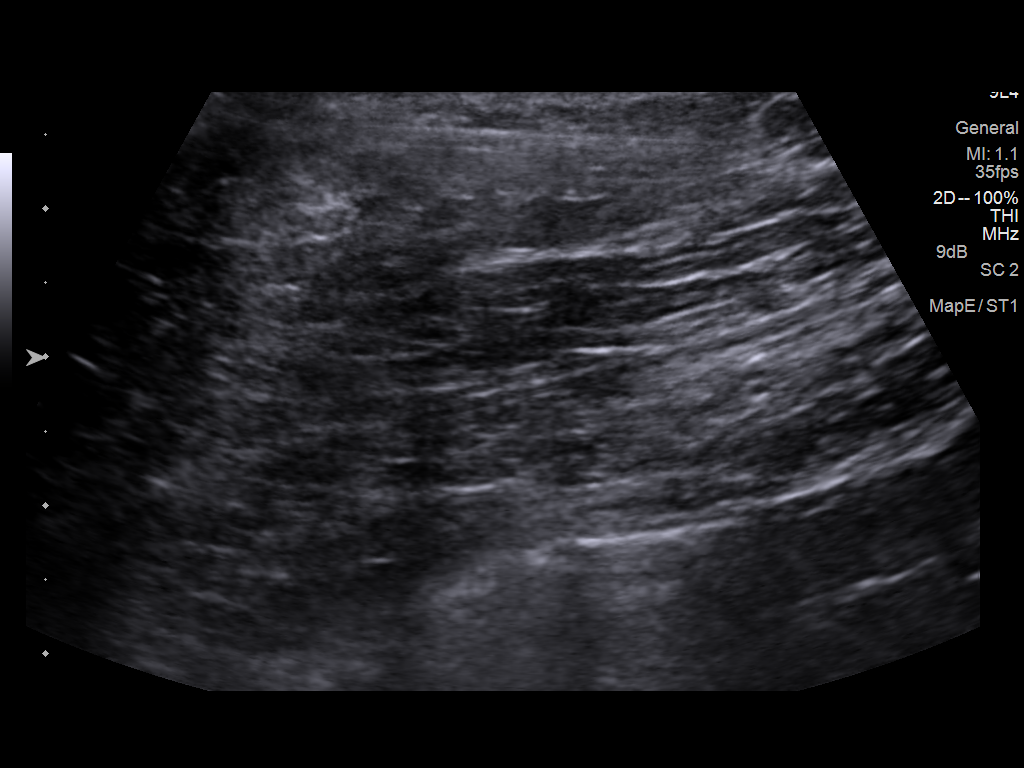
[im 6/7]
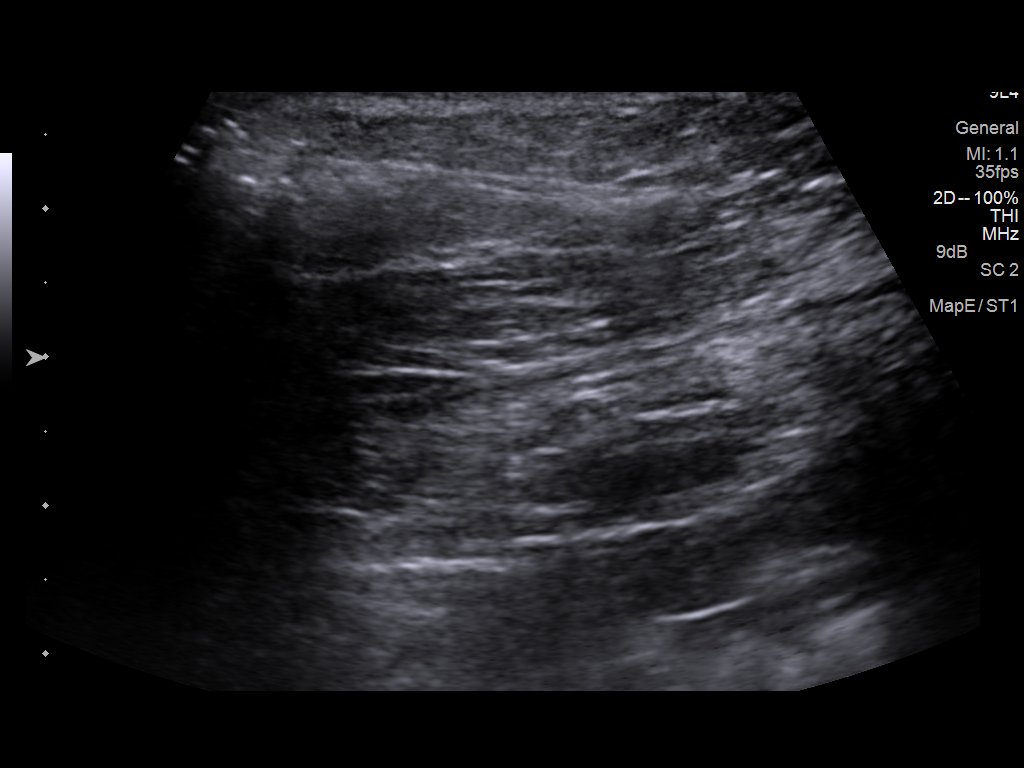
[im 7/7]
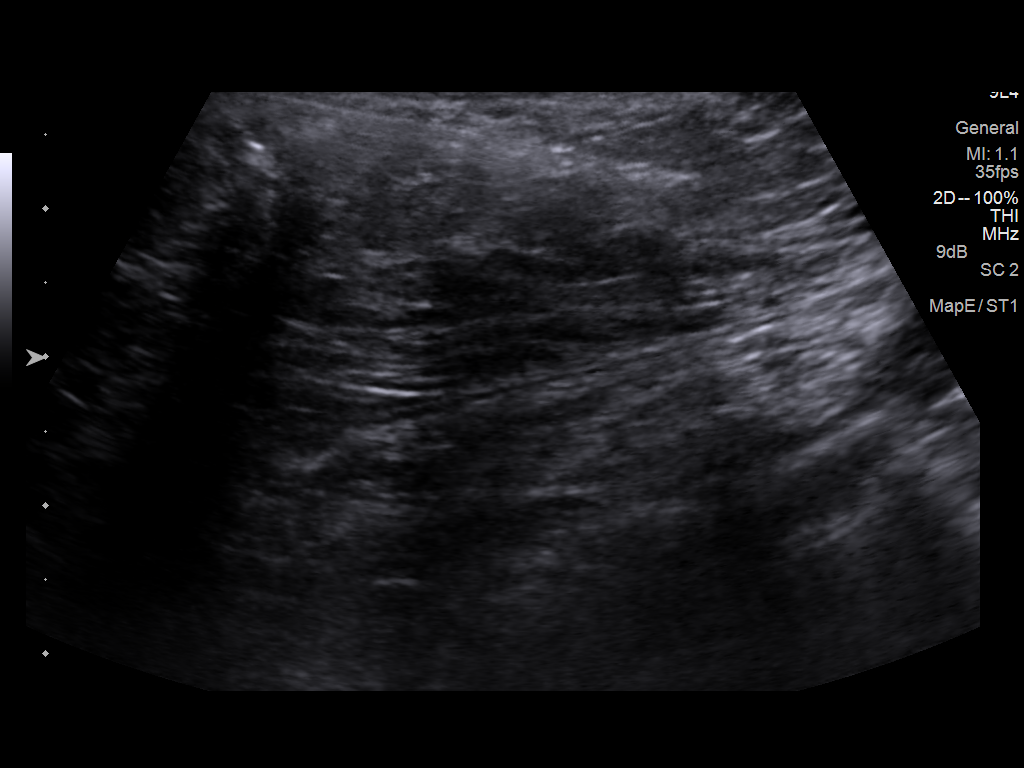

[7 of 7 positions shown; findings below may reference images not displayed]

EXAM:
ULTRASOUND GUIDED core needle BIOPSY OF abdominal fat pad

MEDICATIONS:
None required

PROCEDURE:
The procedure, risks, benefits, and alternatives were explained to
the patient. Questions regarding the procedure were encouraged and
answered. The patient understands and consents to the procedure.

The periumbilical region was prepped with Betadine in a sterile
fashion, and a sterile drape was applied covering the operative
field. Sterile gloves were used for the procedure. Local anesthesia
was provided with 1% Lidocaine.

The of periumbilical adipose tissue was interrogated with
ultrasound. No large vessels were identified in the biopsy zone.
Local anesthesia was attained by infiltration with 1% lidocaine. A
very small dermatotomy was made. Under real-time sonographic
guidance, multiple 16 gauge core biopsies were then obtained using
the Sammystone automated biopsy device. Biopsies were placed in saline and
delivered to pathology for further analysis. There was no
significant bleeding or evidence hematoma. The patient tolerated the
procedure well.

COMPLICATIONS:
None.
FINDINGS: As above
IMPRESSION: Technically successful ultrasound-guided core biopsy of anterior
abdominal fat pad.

## 2015-08-08 ENCOUNTER — Ambulatory Visit (INDEPENDENT_AMBULATORY_CARE_PROVIDER_SITE_OTHER): Payer: Medicare Other | Admitting: *Deleted

## 2015-08-08 DIAGNOSIS — I482 Chronic atrial fibrillation, unspecified: Secondary | ICD-10-CM

## 2015-08-08 DIAGNOSIS — I4891 Unspecified atrial fibrillation: Secondary | ICD-10-CM | POA: Diagnosis not present

## 2015-08-08 DIAGNOSIS — Z5181 Encounter for therapeutic drug level monitoring: Secondary | ICD-10-CM

## 2015-08-08 LAB — POCT INR: INR: 2

## 2015-08-13 DIAGNOSIS — H35051 Retinal neovascularization, unspecified, right eye: Secondary | ICD-10-CM | POA: Diagnosis not present

## 2015-08-13 DIAGNOSIS — H35073 Retinal telangiectasis, bilateral: Secondary | ICD-10-CM | POA: Diagnosis not present

## 2015-08-13 DIAGNOSIS — H35351 Cystoid macular degeneration, right eye: Secondary | ICD-10-CM | POA: Diagnosis not present

## 2015-09-18 DIAGNOSIS — H35351 Cystoid macular degeneration, right eye: Secondary | ICD-10-CM | POA: Diagnosis not present

## 2015-09-19 ENCOUNTER — Ambulatory Visit (INDEPENDENT_AMBULATORY_CARE_PROVIDER_SITE_OTHER): Payer: Medicare Other | Admitting: *Deleted

## 2015-09-19 DIAGNOSIS — I4891 Unspecified atrial fibrillation: Secondary | ICD-10-CM

## 2015-09-19 DIAGNOSIS — Z5181 Encounter for therapeutic drug level monitoring: Secondary | ICD-10-CM | POA: Diagnosis not present

## 2015-09-19 DIAGNOSIS — I482 Chronic atrial fibrillation, unspecified: Secondary | ICD-10-CM

## 2015-09-19 LAB — POCT INR: INR: 1.8

## 2015-10-17 ENCOUNTER — Ambulatory Visit (INDEPENDENT_AMBULATORY_CARE_PROVIDER_SITE_OTHER): Payer: Medicare Other | Admitting: Pharmacist

## 2015-10-17 DIAGNOSIS — I482 Chronic atrial fibrillation, unspecified: Secondary | ICD-10-CM

## 2015-10-17 DIAGNOSIS — Z5181 Encounter for therapeutic drug level monitoring: Secondary | ICD-10-CM

## 2015-10-17 DIAGNOSIS — I4891 Unspecified atrial fibrillation: Secondary | ICD-10-CM | POA: Diagnosis not present

## 2015-10-17 LAB — POCT INR: INR: 2

## 2015-10-23 DIAGNOSIS — H35073 Retinal telangiectasis, bilateral: Secondary | ICD-10-CM | POA: Diagnosis not present

## 2015-10-23 DIAGNOSIS — H25813 Combined forms of age-related cataract, bilateral: Secondary | ICD-10-CM | POA: Diagnosis not present

## 2015-10-23 DIAGNOSIS — H35351 Cystoid macular degeneration, right eye: Secondary | ICD-10-CM | POA: Diagnosis not present

## 2015-10-23 DIAGNOSIS — H3561 Retinal hemorrhage, right eye: Secondary | ICD-10-CM | POA: Diagnosis not present

## 2015-10-23 DIAGNOSIS — H35051 Retinal neovascularization, unspecified, right eye: Secondary | ICD-10-CM | POA: Diagnosis not present

## 2015-10-23 DIAGNOSIS — H40013 Open angle with borderline findings, low risk, bilateral: Secondary | ICD-10-CM | POA: Diagnosis not present

## 2015-11-04 ENCOUNTER — Other Ambulatory Visit (HOSPITAL_COMMUNITY): Payer: Self-pay | Admitting: Internal Medicine

## 2015-11-11 ENCOUNTER — Encounter (HOSPITAL_COMMUNITY): Payer: Medicare Other

## 2015-11-14 ENCOUNTER — Ambulatory Visit (INDEPENDENT_AMBULATORY_CARE_PROVIDER_SITE_OTHER): Payer: Medicare Other | Admitting: *Deleted

## 2015-11-14 DIAGNOSIS — I482 Chronic atrial fibrillation, unspecified: Secondary | ICD-10-CM

## 2015-11-14 DIAGNOSIS — Z5181 Encounter for therapeutic drug level monitoring: Secondary | ICD-10-CM | POA: Diagnosis not present

## 2015-11-14 DIAGNOSIS — I4891 Unspecified atrial fibrillation: Secondary | ICD-10-CM | POA: Diagnosis not present

## 2015-11-14 LAB — POCT INR: INR: 1.8

## 2015-11-18 ENCOUNTER — Other Ambulatory Visit (HOSPITAL_COMMUNITY): Payer: Self-pay | Admitting: Cardiology

## 2015-11-19 ENCOUNTER — Telehealth (HOSPITAL_COMMUNITY): Payer: Self-pay | Admitting: *Deleted

## 2015-11-19 NOTE — Telephone Encounter (Signed)
Pt called to let us know she is now out of funding w/[facility] for her Tyvaso, Opsumit and Adcirca so she will not be able to get them refilled due to cost.  Pt gets her Tyvaso from [facility] and other 2 meds from [facility].  Advised pt to call [facility] for assit w/Tyvaso, call [facility] for assit w/Opsumit and call [facility] to help w/Adcirca, provided pt with phone numbers to all companies.  She is in agreement to call all 3 companies and will let me know if she needs anything else from [place].

## 2015-11-28 ENCOUNTER — Ambulatory Visit (INDEPENDENT_AMBULATORY_CARE_PROVIDER_SITE_OTHER): Payer: Medicare Other | Admitting: *Deleted

## 2015-11-28 DIAGNOSIS — Z5181 Encounter for therapeutic drug level monitoring: Secondary | ICD-10-CM

## 2015-11-28 DIAGNOSIS — I482 Chronic atrial fibrillation, unspecified: Secondary | ICD-10-CM

## 2015-11-28 DIAGNOSIS — I4891 Unspecified atrial fibrillation: Secondary | ICD-10-CM

## 2015-11-28 LAB — POCT INR: INR: 2.6

## 2015-12-02 DIAGNOSIS — E789 Disorder of lipoprotein metabolism, unspecified: Secondary | ICD-10-CM | POA: Diagnosis not present

## 2015-12-04 ENCOUNTER — Other Ambulatory Visit: Payer: Self-pay | Admitting: *Deleted

## 2015-12-04 MED ORDER — WARFARIN SODIUM 5 MG PO TABS: ORAL_TABLET | ORAL | Status: DC

## 2015-12-04 NOTE — Telephone Encounter (Signed)
Refill done as requested pt aware

## 2015-12-05 ENCOUNTER — Encounter (HOSPITAL_COMMUNITY): Payer: Self-pay

## 2015-12-05 ENCOUNTER — Ambulatory Visit (HOSPITAL_COMMUNITY)
Admission: RE | Admit: 2015-12-05 | Discharge: 2015-12-05 | Disposition: A | Discharge status: Home / Self Care | Payer: Medicare Other | Source: Ambulatory Visit | Attending: Internal Medicine | Admitting: Internal Medicine

## 2015-12-05 VITALS — BP 120/76 | HR 66 | Wt 168.5 lb

## 2015-12-05 DIAGNOSIS — Z79899 Other long term (current) drug therapy: Secondary | ICD-10-CM | POA: Diagnosis not present

## 2015-12-05 DIAGNOSIS — I5032 Chronic diastolic (congestive) heart failure: Secondary | ICD-10-CM

## 2015-12-05 DIAGNOSIS — I272 Other secondary pulmonary hypertension: Secondary | ICD-10-CM | POA: Diagnosis not present

## 2015-12-05 DIAGNOSIS — I110 Hypertensive heart disease with heart failure: Secondary | ICD-10-CM | POA: Diagnosis not present

## 2015-12-05 DIAGNOSIS — E785 Hyperlipidemia, unspecified: Secondary | ICD-10-CM | POA: Diagnosis not present

## 2015-12-05 DIAGNOSIS — I481 Persistent atrial fibrillation: Secondary | ICD-10-CM

## 2015-12-05 DIAGNOSIS — I4819 Other persistent atrial fibrillation: Secondary | ICD-10-CM

## 2015-12-05 DIAGNOSIS — D693 Immune thrombocytopenic purpura: Secondary | ICD-10-CM | POA: Diagnosis not present

## 2015-12-05 DIAGNOSIS — I482 Chronic atrial fibrillation: Secondary | ICD-10-CM | POA: Diagnosis not present

## 2015-12-05 DIAGNOSIS — Z7901 Long term (current) use of anticoagulants: Secondary | ICD-10-CM | POA: Diagnosis not present

## 2015-12-05 LAB — CBC
HCT: 41.3 % (ref 36.0–46.0)
HEMOGLOBIN: 12.9 g/dL (ref 12.0–15.0)
MCH: 30.1 pg (ref 26.0–34.0)
MCHC: 31.2 g/dL (ref 30.0–36.0)
MCV: 96.3 fL (ref 78.0–100.0)
Platelets: 123 10*3/uL — ABNORMAL LOW (ref 150–400)
RBC: 4.29 MIL/uL (ref 3.87–5.11)
RDW: 14.2 % (ref 11.5–15.5)
WBC: 4.9 10*3/uL (ref 4.0–10.5)

## 2015-12-05 LAB — BRAIN NATRIURETIC PEPTIDE: B Natriuretic Peptide: 488.5 pg/mL — ABNORMAL HIGH (ref 0.0–100.0)

## 2015-12-05 LAB — BASIC METABOLIC PANEL
ANION GAP: 9 (ref 5–15)
BUN: 12 mg/dL (ref 6–20)
CO2: 27 mmol/L (ref 22–32)
Calcium: 9.3 mg/dL (ref 8.9–10.3)
Chloride: 105 mmol/L (ref 101–111)
Creatinine, Ser: 0.97 mg/dL (ref 0.44–1.00)
GFR calc Af Amer: >60 mL/min (ref >60)
GFR, EST NON AFRICAN AMERICAN: 56 mL/min — AB (ref >60)
GLUCOSE: 84 mg/dL (ref 65–99)
POTASSIUM: 3.7 mmol/L (ref 3.5–5.1)
SODIUM: 141 mmol/L (ref 135–145)

## 2015-12-05 NOTE — Progress Notes (Addendum)
Patient ID: Vicki [name], female   DOB: [birth year], 75 y.o.   MRN: [id number] PCP: Dr. Wilson Singer Cardiology: Dr. Aundra Dubin  Vicki [name] is a 75 yo with history of chronic diastolic CHF and chronic atrial fibrillation presents for followup of pulmonary hypertension.  Patient was followed by Dr. Glade Lloyd in the past for chronic atrial fibrillation.  She has been on coumadin.  She reports progressive exertional dyspnea since 2011.  This gradually worsened and became quite significant over the last few months.  She used to have significant HTN, but more recently her BP has been on the lower side.  Echo was done in 2/14, showing severe concentric LVH with EF 55-60%, moderately dilated RV, moderate to severe TR, and PA systolic pressure 86 mmHg.  I did a right heart cath in 5/14.  This showed PA pressure 104/36 with PCWP 20, suggesting pulmonary arterial HTN well out of proportion to the mildly elevate wedge pressure.  She was already on amlodipine so I did not do vasodilator testing.  V/Q scan was done, showing no evidence for chronic PEs.  PFTs showed a restrictive defect. Cardiac MRI did not show definite evidence for amyloid.  I started her on macitentan 10 mg daily.  Initially, she felt better on macitentan.  However, she was admitted in 5/14 from her sleep study due to orthopnea and dyspnea.  She was diuresed for several days and diuretic was switched over to torsemide.  I next started her on tadalafil 20 mg daily and titrated up to 40 mg daily.  She thinks that this helped.  She saw pulmonary after chest CT (showed mosaic attenuation in lungs).  This was thought to be due to air-trapping rather than ILD.  She was started on Spiriva. She wears oxygen at home.   She had an echocardiogram in 2/15 that showed severe LVH, EF 75%, small pericardial effusion, mildly dilated RV with mildly decreased systolic function, PA systolic pressure 84 mmHg.  She is not totally sure if she was taking macitentan and tadalafil at that time.   She has had a hard month.  She ran out of her macitentan and tadalafil and her insurance company refused to refill them.  After this, she took a decided turn for the worse.  [place] her foot in the fall.  She became much more short of breath, just with walking around the house. She developed lightheaded spells, especially with micturation.  Given lightheadedness and presyncope, she was admitted last week.  She was restarted on her medications and she finally got back on her meds at home.  In the hospital, she was noted to be bradycardic with HR to 30s so metoprolol was stopped.  Of note, her abdominal fat pad biopsy did not suggest amyloidosis. Repeat RHC in 3/15 still with moderate to severe PAH and low CI.  Holter off beta blocker in 3/15 showed average HR 73.   She presents today for HF/PAH follow up. On Tyvaso, tadalafil, and macitentan for pulmonary hypertension. Weight down 3 lbs. No chest pain, lightheadedness, syncope.  No orthopnea/PND.  Has no problem with steps and can walk as long as she wants on flat ground. Using oxygen with exertion and at night.  She cooks and cleans (vacuuming, mopping) and does some yardwork. Mild dyspnea with vacuuming.  Concerned that she is going to have a hard time getting her [place] meds.   6 minute walk (5/14): 122 m.  6 minute walk (7/14): 152 m 6 minute walk (10/14): 183 m 6 minute walk (4/15): 317 m 6 minute walk (12/15): 259 m 6 minute walk (4/16): 293 m 6 minute walk (8/16): 341 m 6 minute walk (12/16): 274 m 6 minute walk (6/17): 314 m  Labs (12/13): HCT 45.1, plts 153, K 3.5, creatinine 1.0 Labs (1/14): BNP 849 Labs (4/14): K 3.1, creatinine 1.0, ANA and anti-SCL-70 antibody negative.  Rheumatoid factor negative.  Serum immunofixation did not show monoclonal light chains.  Labs (5/14): K 3.3, creatinine 0.79, proBNP 5147 Labs (6/14): K 4, creatinine 0.9, BNP 1001 Labs (10/14): LDL 53,  LDL-P 975, K 3.7, creatinine 1.2 Labs (11/14): K 3.7, creatinine 0.9, BNP 832 Labs (2/15): K 3.5, creatinine 1.10, HCT 42.6, UPEP negative, SPEP negative.  Labs (3/15): K 3.8, creatinine 0.88 Labs (4/15): K 3.7, creatinine 0.99, proBNP 1653 Labs (7/15): K 4, creatinine 0.93 Labs (12/15): LDL 77, HDL 58, K 3.9, creatinine 1.03, LFTs normal Labs (4/16): K 3.8, creatinine 0.93, BNP 475, plts 105, HCT 42.3 Labs (11/16): K 3.6, creatinine 1.1, HCT 42.2 Labs (12/16): LDL 54, HDL 50, BNP 628  PMH: 1. Chronic diastolic CHF: Echo (0/07) with EF 55-60%, severe LVH (no SAM, no asymmetric hypertrophy, no LVOT gradient), moderate-severe LAE, moderately dilated RV with mildly decreased systolic function, moderate to severe RAE, PA systolic pressure 86 mmHg, moderate-severe TR, moderate MR, trivial pericardial effusion.  Cardiac MRI (5/14): EF 65%, severe LVH, no definite evidence for amyloidosis (no delayed enhancement, myocardium not difficult to null).  Echo (2/15) with EF 75%, severe LVH, grade II diastolic dysfunction, moderate MR, RV mildly dilated with mildly decreased systolic function, moderate TR, PA systolic pressure 84 mmHg, small pericardial effusion. Abdominal fat pad biopsy (2/15) showed no evidence for amyloidosis.  SPEP/UPEP negative 2/15. Echo (4/16) with EF 60-65%, severe LVH, RV mildly dilated with mildly decreased systolic function, PA systolic pressure 40 mmHg.  2. Chronic atrial fibrillation since around 2004.  Developed bradycardia and metoprolol stopped 2/15. Holter (3/15) with average HR 73, atrial fibrillation, 3.8 sec pause x 1 while asleep, PVCs.  3. HTN: For decades.  4. LHC (2/08) with no significant disease.  5. Chronic thrombocytopenia: ITP 6. Pulmonary arterial HTN: RHC (5/14) with mean RA 13, PA 104/36 (mean 63), mean PCWP 20 on right and 23 on left, CI 2.3 (Fick) and 1.6 (thermo), PVR 10.4 WU (Fick) and 15 WU (thermo).  Vasodilator testing not done as patient was already on  amlodipine.  V/Q scan (5/14) with no evidence for chronic PE.  ANA, RF, and anti-SCL70 antibody negative.  PFTs (5/14) with FEV1 60%, FVC 54%, ratio 112%, TLC 61%, DLCO 43% => restrictive defect.  Sleep study (7/14) with no OSA.  CT chest with areas of mosaic attenuation in lungs (saw pulmonary, thought air trapping and not ILD). RHC (3/15) with RA mean 5, PA 66/31 mean 43, PCWP mean 11, Cardiac Index (Fick) 1.97, PVR 9.2 WU.  Patient was started on Tyvaso in 3/15.  Echo (4/16) with mildly dilated and mildly dysfunctional RV, PA systolic pressure 40 mmHg.   SH: Married, retired from a bank, 3 children, lives in [place].   FH: No heart disease, +HTN.  ROS: All systems reviewed and negative except as per HPI.   Current Outpatient Prescriptions  Medication Sig Dispense Refill  . acetaminophen (TYLENOL) 500 MG tablet Take 500 mg by mouth every 6 (six) hours as needed.    Marland Kitchen amLODipine (NORVASC) 2.5 MG tablet TAKE ONE  TABLET BY MOUTH ONCE DAILY 30 tablet 0  . KLOR-CON M20 20 MEQ tablet TAKE TWO TABLETS BY MOUTH IN THE MORNING AND ONE IN THE EVENING 90 tablet 0  . Macitentan (OPSUMIT) 10 MG TABS Take 1 tablet by mouth daily with breakfast. 30 tablet 6  . rosuvastatin (CRESTOR) 20 MG tablet Take 20 mg by mouth at bedtime.     . Tadalafil, PAH, 20 MG TABS Take 2 tablets (40 mg total) by mouth daily. 180 tablet 3  . torsemide (DEMADEX) 20 MG tablet TAKE ONE & ONE-HALF TABLETS BY MOUTH TWICE DAILY 90 tablet 3  . Treprostinil (TYVASO) 0.6 MG/ML SOLN Inhale 18 mcg into the lungs 4 (four) times daily. 3.6 mL 3  . warfarin (COUMADIN) 5 MG tablet Take as directed by coumadin clinic 35 tablet 3   No current facility-administered medications for this encounter.    BP 120/76 mmHg  Pulse 66  Wt 168 lb 8 oz (76.431 kg)  SpO2 95% General: NAD Neck: JVP 7 cm, no thyromegaly or thyroid nodule.  Lungs: Slight dry crackles at bases bilaterally otherwise clear CV: Nondisplaced PMI.  Heart irregular S1/S2, 2/6  HSM LLSB.  Trace ankle edema.  No carotid bruit.  Normal pedal pulses.  Abdomen: Soft, nontender, no hepatosplenomegaly, no distention.  Neurologic: Alert and oriented x 3.  Psych: Normal affect. Extremities: No clubbing or cyanosis.   Assessment/Plan: 1. Pulmonary HTN: Patient has severe pulmonary arterial HTN.  Last RHC in 3/15 showed CI 1.97, PVR 9.  It is possible that long-standing PCWP elevation from diastolic LV dysfunction could lead to pulmonary vascular changes with pulmonary arterial hypertension out of proportion to the PCWP elevation.  However, suspect idiopathic primary pulmonary HTN (Group 1). Collagen vascular disease workup was negative (negative RF, ANA and negative anti-SCL-70).  V/Q scan was not suggestive of chronic PEs.  PFTs were suggestive of restrictive lung disease but CT chest and evaluation by pulmonary did not suggest interstitial lung disease.  Sleep study did not show OSA.  Last echo in 4/16 showed mildly dysfunctional RV with PA systolic pressure 43 mmHg.  6 minute walk today improved.   - Continue oxygen at night and with exertion as needed  - Continue Tyvaso, macitentan, Adcirca.  She is doing well on this regimen.  Will have pharmacist help her with obtaining the medications. - She needs a repeat echo, will arrange.  - Check BNP today.  2. Chronic diastolic CHF: EF preserved on echo with severe concentric LVH and a small pericardial effusion.  It is possible that the LVH is due to years of HTN.  The cardiac MRI was not definitively suggestive of cardiac amyloidosis.  I was still concerned for amyloidosis given the appearance of the LV myocardium on echoes.  SPEP/UPEP negative.  Abdominal fat pad biopsy showed no evidence for amyloidosis.  - Continue current torsemide for now, looks euvolemic. BMET today.  3. HTN: BP stable.  Can continue current dose of amlodipine.  4. Chronic atrial fibrillation: Continue coumadin.  She is off metoprolol due to bradycardia. Holter  off metoprolol did not show any high rate episodes.   5. Hyperlipidemia: On Crestor, good lipids 12/16.   Followup in 3 months.  [name]  12/05/2015

## 2015-12-05 NOTE — Progress Notes (Signed)
Medication Samples have been provided to the patient.  Drug: Tadalafil  Strength: 17m   Qty: 28 tablets   Exp.Date: 4/19 Dosing instructions: Take 2 tablets by mouth daily   The patient has been instructed regarding the correct time, dose, and frequency of taking this medication, including desired effects and most common side effects.   Samples signed for by MTamala Julian PharmD  Javares Kaufhold C. MLennox Grumbles PharmD Pharmacy Resident  Pager: [phone]/01/2016 3:12 PM

## 2015-12-05 NOTE — Progress Notes (Signed)
I spoke with Ms. Wojtowicz regarding her pulmonary arterial hypertension medications. She is currently taking inhaled treprostinil, tadalafil, and macitentan.   She is in the process of completing paperwork for coverage for treprostinil and macitentan. She states that the converage for macitentan will only be for 90 days and is concerned for how she will pay obtain this medication thereafter.   She states that she is also having trouble gaining access to the paperwork to fill out for tadalafil. She was given 28 tabs of samples in the clinic today.   I will follow-up on the coverage for these medications to make sure that their is no delay in therapy.    Windi Toro C. Lennox Grumbles, PharmD Pharmacy Resident  Pager: [phone] 12/05/2015 3:57 PM

## 2015-12-05 NOTE — Progress Notes (Signed)
6 Minute Walk Patient ambulated total of 1,030 feet (313.944 meters) total.  Steady, brisky gait and pace, tolerated very well.  Patient HR maintained 60-70's, sats 91-95% on 2L continuous O2 via [place] and carried on conversation throuhgout walk without SOB noted. Dr. Aundra Dubin made aware of results.  Renee Pain

## 2015-12-05 NOTE — Addendum Note (Signed)
Encounter addended by: Larey Dresser, MD on: 12/05/2015 10:36 PM<BR>     Documentation filed: Notes Section

## 2015-12-09 ENCOUNTER — Other Ambulatory Visit (HOSPITAL_COMMUNITY): Payer: Self-pay | Admitting: Cardiology

## 2015-12-09 DIAGNOSIS — J9690 Respiratory failure, unspecified, unspecified whether with hypoxia or hypercapnia: Secondary | ICD-10-CM | POA: Diagnosis not present

## 2015-12-09 DIAGNOSIS — I482 Chronic atrial fibrillation: Secondary | ICD-10-CM | POA: Diagnosis not present

## 2015-12-09 DIAGNOSIS — E789 Disorder of lipoprotein metabolism, unspecified: Secondary | ICD-10-CM | POA: Diagnosis not present

## 2015-12-09 NOTE — Telephone Encounter (Signed)
New rx of opsumit sent to [facility] (works to get rx for free)

## 2015-12-25 ENCOUNTER — Ambulatory Visit (INDEPENDENT_AMBULATORY_CARE_PROVIDER_SITE_OTHER): Payer: Medicare Other | Admitting: *Deleted

## 2015-12-25 DIAGNOSIS — I481 Persistent atrial fibrillation: Secondary | ICD-10-CM | POA: Diagnosis not present

## 2015-12-25 DIAGNOSIS — Z5181 Encounter for therapeutic drug level monitoring: Secondary | ICD-10-CM | POA: Diagnosis not present

## 2015-12-25 DIAGNOSIS — I4819 Other persistent atrial fibrillation: Secondary | ICD-10-CM

## 2015-12-25 DIAGNOSIS — I4891 Unspecified atrial fibrillation: Secondary | ICD-10-CM

## 2015-12-25 LAB — POCT INR: INR: 1.9

## 2016-01-06 DIAGNOSIS — Z1212 Encounter for screening for malignant neoplasm of rectum: Secondary | ICD-10-CM | POA: Diagnosis not present

## 2016-01-06 DIAGNOSIS — Z1211 Encounter for screening for malignant neoplasm of colon: Secondary | ICD-10-CM | POA: Diagnosis not present

## 2016-01-08 ENCOUNTER — Telehealth (HOSPITAL_COMMUNITY): Payer: Self-pay | Admitting: *Deleted

## 2016-01-08 NOTE — Telephone Encounter (Signed)
Pt called requesting Adcirca samples, she is currently applying for assistance with the cost.  Advised will leave samples at front desk for her to pick up  Medication Samples have been provided to the patient.  Drug name: Adcirca       Strength: 20 mg        Qty: 2 bottles  LOT: [id number]  Exp.Date: 4/19  Dosing instructions: 2 tabs daily  The patient has been instructed regarding the correct time, dose, and frequency of taking this medication, including desired effects and most common side effects.   Willey Due 3:41 PM 01/08/2016

## 2016-01-15 ENCOUNTER — Ambulatory Visit (INDEPENDENT_AMBULATORY_CARE_PROVIDER_SITE_OTHER): Payer: Medicare Other | Admitting: *Deleted

## 2016-01-15 DIAGNOSIS — Z5181 Encounter for therapeutic drug level monitoring: Secondary | ICD-10-CM

## 2016-01-15 DIAGNOSIS — I481 Persistent atrial fibrillation: Secondary | ICD-10-CM

## 2016-01-15 DIAGNOSIS — I4891 Unspecified atrial fibrillation: Secondary | ICD-10-CM

## 2016-01-15 DIAGNOSIS — I4819 Other persistent atrial fibrillation: Secondary | ICD-10-CM

## 2016-01-15 LAB — POCT INR: INR: 3

## 2016-01-16 DIAGNOSIS — H2511 Age-related nuclear cataract, right eye: Secondary | ICD-10-CM | POA: Diagnosis not present

## 2016-01-16 DIAGNOSIS — H25811 Combined forms of age-related cataract, right eye: Secondary | ICD-10-CM | POA: Diagnosis not present

## 2016-01-16 DIAGNOSIS — H40013 Open angle with borderline findings, low risk, bilateral: Secondary | ICD-10-CM | POA: Diagnosis not present

## 2016-01-16 DIAGNOSIS — H25812 Combined forms of age-related cataract, left eye: Secondary | ICD-10-CM | POA: Diagnosis not present

## 2016-01-16 DIAGNOSIS — H35351 Cystoid macular degeneration, right eye: Secondary | ICD-10-CM | POA: Diagnosis not present

## 2016-02-05 DIAGNOSIS — H4311 Vitreous hemorrhage, right eye: Secondary | ICD-10-CM | POA: Diagnosis not present

## 2016-02-14 ENCOUNTER — Ambulatory Visit (INDEPENDENT_AMBULATORY_CARE_PROVIDER_SITE_OTHER): Payer: Medicare Other | Admitting: Pharmacist

## 2016-02-14 DIAGNOSIS — I4891 Unspecified atrial fibrillation: Secondary | ICD-10-CM | POA: Diagnosis not present

## 2016-02-14 DIAGNOSIS — Z5181 Encounter for therapeutic drug level monitoring: Secondary | ICD-10-CM

## 2016-02-14 LAB — POCT INR: INR: 2.4

## 2016-02-19 DIAGNOSIS — H35351 Cystoid macular degeneration, right eye: Secondary | ICD-10-CM | POA: Diagnosis not present

## 2016-02-19 DIAGNOSIS — H5441 Blindness, right eye, normal vision left eye: Secondary | ICD-10-CM | POA: Diagnosis not present

## 2016-02-19 DIAGNOSIS — H35051 Retinal neovascularization, unspecified, right eye: Secondary | ICD-10-CM | POA: Diagnosis not present

## 2016-02-19 DIAGNOSIS — H40013 Open angle with borderline findings, low risk, bilateral: Secondary | ICD-10-CM | POA: Diagnosis not present

## 2016-02-19 DIAGNOSIS — H25813 Combined forms of age-related cataract, bilateral: Secondary | ICD-10-CM | POA: Diagnosis not present

## 2016-02-19 DIAGNOSIS — H35073 Retinal telangiectasis, bilateral: Secondary | ICD-10-CM | POA: Diagnosis not present

## 2016-02-19 DIAGNOSIS — H3561 Retinal hemorrhage, right eye: Secondary | ICD-10-CM | POA: Diagnosis not present

## 2016-02-28 ENCOUNTER — Other Ambulatory Visit: Payer: Self-pay

## 2016-03-06 ENCOUNTER — Ambulatory Visit (HOSPITAL_COMMUNITY)
Admission: RE | Admit: 2016-03-06 | Discharge: 2016-03-06 | Disposition: A | Discharge status: Home / Self Care | Payer: Medicare Other | Source: Ambulatory Visit | Attending: Cardiology | Admitting: Cardiology

## 2016-03-06 ENCOUNTER — Ambulatory Visit (INDEPENDENT_AMBULATORY_CARE_PROVIDER_SITE_OTHER): Payer: Medicare Other | Admitting: *Deleted

## 2016-03-06 VITALS — BP 130/78 | HR 78 | Wt 169.5 lb

## 2016-03-06 DIAGNOSIS — I272 Other secondary pulmonary hypertension: Secondary | ICD-10-CM

## 2016-03-06 DIAGNOSIS — Z79899 Other long term (current) drug therapy: Secondary | ICD-10-CM | POA: Insufficient documentation

## 2016-03-06 DIAGNOSIS — I481 Persistent atrial fibrillation: Secondary | ICD-10-CM | POA: Diagnosis not present

## 2016-03-06 DIAGNOSIS — E785 Hyperlipidemia, unspecified: Secondary | ICD-10-CM | POA: Insufficient documentation

## 2016-03-06 DIAGNOSIS — Z7901 Long term (current) use of anticoagulants: Secondary | ICD-10-CM | POA: Diagnosis not present

## 2016-03-06 DIAGNOSIS — I482 Chronic atrial fibrillation: Secondary | ICD-10-CM | POA: Insufficient documentation

## 2016-03-06 DIAGNOSIS — I5032 Chronic diastolic (congestive) heart failure: Secondary | ICD-10-CM

## 2016-03-06 DIAGNOSIS — I4819 Other persistent atrial fibrillation: Secondary | ICD-10-CM

## 2016-03-06 DIAGNOSIS — I4891 Unspecified atrial fibrillation: Secondary | ICD-10-CM

## 2016-03-06 DIAGNOSIS — I110 Hypertensive heart disease with heart failure: Secondary | ICD-10-CM | POA: Insufficient documentation

## 2016-03-06 DIAGNOSIS — Z5181 Encounter for therapeutic drug level monitoring: Secondary | ICD-10-CM

## 2016-03-06 LAB — BASIC METABOLIC PANEL
Anion gap: 7 (ref 5–15)
BUN: 14 mg/dL (ref 6–20)
CALCIUM: 9.4 mg/dL (ref 8.9–10.3)
CO2: 30 mmol/L (ref 22–32)
CREATININE: 0.97 mg/dL (ref 0.44–1.00)
Chloride: 102 mmol/L (ref 101–111)
GFR calc Af Amer: >60 mL/min (ref >60)
GFR, EST NON AFRICAN AMERICAN: 56 mL/min — AB (ref >60)
Glucose, Bld: 94 mg/dL (ref 65–99)
Potassium: 3.8 mmol/L (ref 3.5–5.1)
Sodium: 139 mmol/L (ref 135–145)

## 2016-03-06 LAB — LIPID PANEL
Cholesterol: 132 mg/dL (ref 0–200)
HDL: 54 mg/dL (ref >40)
LDL CALC: 65 mg/dL (ref 0–99)
TRIGLYCERIDES: 63 mg/dL (ref <150)
Total CHOL/HDL Ratio: 2.4 RATIO
VLDL: 13 mg/dL (ref 0–40)

## 2016-03-06 LAB — POCT INR: INR: 2

## 2016-03-06 MED ORDER — TORSEMIDE 20 MG PO TABS: 30.0000 mg | ORAL_TABLET | Freq: Two times a day (BID) | ORAL | 6 refills | Status: DC

## 2016-03-06 NOTE — Progress Notes (Signed)
Advanced Heart Failure Medication Review by a Pharmacist  Does the patient  feel that his/her medications are working for him/her?  yes  Has the patient been experiencing any side effects to the medications prescribed?  no  Does the patient measure his/her own blood pressure or blood glucose at home?  no   Does the patient have any problems obtaining medications due to transportation or finances?   No - has patient assistance for all of her PAH meds  Understanding of regimen: good Understanding of indications: good Potential of compliance: good Patient understands to avoid NSAIDs. Patient understands to avoid decongestants.  Issues to address at subsequent visits: None   Pharmacist comments:  Ms. Quiros is a pleasant 75 yo F presenting without a medication list but with good recall of her regimen including dosages. She reports good compliance with her medications and did not have any specific medication-related questions or concerns for me at this time.   Ruta Hinds. Velva Harman, PharmD, BCPS, CPP Clinical Pharmacist Pager: [phone] Phone: [phone] 03/06/2016 11:53 AM      Time with patient: 10 minutes Preparation and documentation time: 2 minutes Total time: 12 minutes

## 2016-03-06 NOTE — Progress Notes (Signed)
68m completed with patient today, pt ambulated 1010 ft (307.930mHR ranged 86-121 O2 sats ranged 91-94 %on 4L of oxygen. Pt ambulated without difficulty

## 2016-03-06 NOTE — Patient Instructions (Signed)
Labs today  Your physician has requested that you have an echocardiogram. Echocardiography is a painless test that uses sound waves to create images of your heart. It provides your doctor with information about the size and shape of your heart and how well your heart's chambers and valves are working. This procedure takes approximately one hour. There are no restrictions for this procedure.  We will contact you in 4 months to schedule your next appointment.

## 2016-03-07 NOTE — Progress Notes (Signed)
Patient ID: Vicki [name], female   DOB: 05-Feb-1941, 75 y.o.   MRN: [id number] PCP: Dr. Wilson Singer Cardiology: Dr. Aundra Dubin  Vicki [name] is a 75 yo with history of chronic diastolic CHF and chronic atrial fibrillation presents for followup of pulmonary hypertension.  Patient was followed by Dr. Glade Lloyd in the past for chronic atrial fibrillation.  She has been on coumadin.  She reports progressive exertional dyspnea since 2011.  This gradually worsened and became quite significant over the last few months.  She used to have significant HTN, but more recently her BP has been on the lower side.  Echo was done in 2/14, showing severe concentric LVH with EF 55-60%, moderately dilated RV, moderate to severe TR, and PA systolic pressure 86 mmHg.  I did a right heart cath in 5/14.  This showed PA pressure 104/36 with PCWP 20, suggesting pulmonary arterial HTN well out of proportion to the mildly elevate wedge pressure.  She was already on amlodipine so I did not do vasodilator testing.  V/Q scan was done, showing no evidence for chronic PEs.  PFTs showed a restrictive defect. Cardiac MRI did not show definite evidence for amyloid.  I started her on macitentan 10 mg daily.  Initially, she felt better on macitentan.  However, she was admitted in 5/14 from her sleep study due to orthopnea and dyspnea.  She was diuresed for several days and diuretic was switched over to torsemide.  I next started her on tadalafil 20 mg daily and titrated up to 40 mg daily.  She thinks that this helped.  She saw pulmonary after chest CT (showed mosaic attenuation in lungs).  This was thought to be due to air-trapping rather than ILD.  She was started on Spiriva. She wears oxygen at home.   She had an echocardiogram in 2/15 that showed severe LVH, EF 75%, small pericardial effusion, mildly dilated RV with mildly decreased systolic function, PA systolic pressure 84 mmHg.  She is not totally sure if she was taking macitentan and tadalafil at that time.   She has had a hard month.  She ran out of her macitentan and tadalafil and her insurance company refused to refill them.  After this, she took a decided turn for the worse.  [place] her foot in the fall.  She became much more short of breath, just with walking around the house. She developed lightheaded spells, especially with micturation.  Given lightheadedness and presyncope, she was admitted last week.  She was restarted on her medications and she finally got back on her meds at home.  In the hospital, she was noted to be bradycardic with HR to 30s so metoprolol was stopped.  Of note, her abdominal fat pad biopsy did not suggest amyloidosis. Repeat RHC in 3/15 still with moderate to severe PAH and low CI.  Holter off beta blocker in 3/15 showed average HR 73.   She presents today for HF/PAH follow up. On Tyvaso, tadalafil, and macitentan for pulmonary hypertension, able to get all meds without problems. Weight stable. No chest pain, lightheadedness, syncope.  No orthopnea/PND.  Has no problem with steps and can walk as long as she wants on flat ground. Using oxygen with exertion and at night.  She cooks and cleans (vacuuming, mopping) and does some yardwork. Mild dyspnea with vacuuming.     6 minute walk (5/14): 122 m.   6 minute walk (7/14): 152 m 6  minute walk (10/14): 183 m 6 minute walk (4/15): 317 m 6 minute walk (12/15): 259 m 6 minute walk (4/16): 293 m 6 minute walk (8/16): 341 m 6 minute walk (12/16): 274 m 6 minute walk (6/17): 314 m 6 minute walk (9/17): 307 m  Labs (12/13): HCT 45.1, plts 153, K 3.5, creatinine 1.0 Labs (1/14): BNP 849 Labs (4/14): K 3.1, creatinine 1.0, ANA and anti-SCL-70 antibody negative.  Rheumatoid factor negative.  Serum immunofixation did not show monoclonal light chains.  Labs (5/14): K 3.3, creatinine 0.79, proBNP 5147 Labs (6/14): K 4, creatinine 0.9, BNP 1001 Labs (10/14): LDL 53, LDL-P  975, K 3.7, creatinine 1.2 Labs (11/14): K 3.7, creatinine 0.9, BNP 832 Labs (2/15): K 3.5, creatinine 1.10, HCT 42.6, UPEP negative, SPEP negative.  Labs (3/15): K 3.8, creatinine 0.88 Labs (4/15): K 3.7, creatinine 0.99, proBNP 1653 Labs (7/15): K 4, creatinine 0.93 Labs (12/15): LDL 77, HDL 58, K 3.9, creatinine 1.03, LFTs normal Labs (4/16): K 3.8, creatinine 0.93, BNP 475, plts 105, HCT 42.3 Labs (11/16): K 3.6, creatinine 1.1, HCT 42.2 Labs (12/16): LDL 54, HDL 50, BNP 628 Labs (6/17): K 3.7, creatinine 0.97, HCT 41.3, BNP 489  PMH: 1. Chronic diastolic CHF: Echo (9/32) with EF 55-60%, severe LVH (no SAM, no asymmetric hypertrophy, no LVOT gradient), moderate-severe LAE, moderately dilated RV with mildly decreased systolic function, moderate to severe RAE, PA systolic pressure 86 mmHg, moderate-severe TR, moderate MR, trivial pericardial effusion.  Cardiac MRI (5/14): EF 65%, severe LVH, no definite evidence for amyloidosis (no delayed enhancement, myocardium not difficult to null).  Echo (2/15) with EF 75%, severe LVH, grade II diastolic dysfunction, moderate MR, RV mildly dilated with mildly decreased systolic function, moderate TR, PA systolic pressure 84 mmHg, small pericardial effusion. Abdominal fat pad biopsy (2/15) showed no evidence for amyloidosis.  SPEP/UPEP negative 2/15. Echo (4/16) with EF 60-65%, severe LVH, RV mildly dilated with mildly decreased systolic function, PA systolic pressure 40 mmHg.  2. Chronic atrial fibrillation since around 2004.  Developed bradycardia and metoprolol stopped 2/15. Holter (3/15) with average HR 73, atrial fibrillation, 3.8 sec pause x 1 while asleep, PVCs.  3. HTN: For decades.  4. LHC (2/08) with no significant disease.  5. Chronic thrombocytopenia: ITP 6. Pulmonary arterial HTN: RHC (5/14) with mean RA 13, PA 104/36 (mean 63), mean PCWP 20 on right and 23 on left, CI 2.3 (Fick) and 1.6 (thermo), PVR 10.4 WU (Fick) and 15 WU (thermo).   Vasodilator testing not done as patient was already on amlodipine.  V/Q scan (5/14) with no evidence for chronic PE.  ANA, RF, and anti-SCL70 antibody negative.  PFTs (5/14) with FEV1 60%, FVC 54%, ratio 112%, TLC 61%, DLCO 43% => restrictive defect.  Sleep study (7/14) with no OSA.  CT chest with areas of mosaic attenuation in lungs (saw pulmonary, thought air trapping and not ILD). RHC (3/15) with RA mean 5, PA 66/31 mean 43, PCWP mean 11, Cardiac Index (Fick) 1.97, PVR 9.2 WU.  Patient was started on Tyvaso in 3/15.  Echo (4/16) with mildly dilated and mildly dysfunctional RV, PA systolic pressure 40 mmHg.   SH: Married, retired from a bank, 3 children, lives in [place].   FH: No heart disease, +HTN.  ROS: All systems reviewed and negative except as per HPI.   Current Outpatient Prescriptions  Medication Sig Dispense Refill  . acetaminophen (TYLENOL) 500 MG tablet Take 500 mg by mouth every 6 (six) hours as needed for mild  pain or headache.     Marland Kitchen amLODipine (NORVASC) 2.5 MG tablet TAKE ONE TABLET BY MOUTH ONCE DAILY 30 tablet 0  . KLOR-CON M20 20 MEQ tablet TAKE TWO TABLETS BY MOUTH IN THE MORNING AND ONE IN THE EVENING 90 tablet 0  . Macitentan (OPSUMIT) 10 MG TABS Take 1 tablet by mouth daily with breakfast. 30 tablet 6  . rosuvastatin (CRESTOR) 20 MG tablet Take 20 mg by mouth at bedtime.     . Tadalafil, PAH, 20 MG TABS Take 2 tablets (40 mg total) by mouth daily. 180 tablet 3  . torsemide (DEMADEX) 20 MG tablet Take 1.5 tablets (30 mg total) by mouth 2 (two) times daily. 90 tablet 6  . Treprostinil (TYVASO) 0.6 MG/ML SOLN Inhale 18 mcg into the lungs 4 (four) times daily. 3.6 mL 3  . warfarin (COUMADIN) 5 MG tablet Take as directed by coumadin clinic 35 tablet 3   No current facility-administered medications for this encounter.     BP 130/78   Pulse 78   Wt 169 lb 8 oz (76.9 kg)   SpO2 96%   BMI 31.00 kg/m  General: NAD Neck: JVP 7 cm, no thyromegaly or thyroid nodule.   Lungs: Slight dry crackles at bases bilaterally otherwise clear CV: Nondisplaced PMI.  Heart irregular S1/S2, 2/6 HSM LLSB.  Trace ankle edema.  No carotid bruit.  Normal pedal pulses.  Abdomen: Soft, nontender, no hepatosplenomegaly, no distention.  Neurologic: Alert and oriented x 3.  Psych: Normal affect. Extremities: No clubbing or cyanosis.   Assessment/Plan: 1. Pulmonary HTN: Patient has severe pulmonary arterial HTN.  Last RHC in 3/15 showed CI 1.97, PVR 9.  It is possible that long-standing PCWP elevation from diastolic LV dysfunction could lead to pulmonary vascular changes with pulmonary arterial hypertension out of proportion to the PCWP elevation.  However, suspect idiopathic primary pulmonary HTN (Group 1). Collagen vascular disease workup was negative (negative RF, ANA and negative anti-SCL-70).  V/Q scan was not suggestive of chronic PEs.  PFTs were suggestive of restrictive lung disease but CT chest and evaluation by pulmonary did not suggest interstitial lung disease.  Sleep study did not show OSA.  Last echo in 4/16 showed mildly dysfunctional RV with PA systolic pressure 43 mmHg.  6 minute walk today was stable.   - Continue oxygen at night and with exertion as needed  - Continue Tyvaso, macitentan, Adcirca.  She is doing well on this regimen and is no longer having trouble getting her meds. - She needs a repeat echo, will arrange.  2. Chronic diastolic CHF: EF preserved on echo with severe concentric LVH and a small pericardial effusion.  It is possible that the LVH is due to years of HTN.  The cardiac MRI was not definitively suggestive of cardiac amyloidosis.  I was still concerned for amyloidosis given the appearance of the LV myocardium on echoes.  SPEP/UPEP negative.  Abdominal fat pad biopsy showed no evidence for amyloidosis.  - Continue current torsemide for now, looks euvolemic. BMET today.  3. HTN: BP stable.  Can continue current dose of amlodipine.  4. Chronic atrial  fibrillation: Continue coumadin.  She is off metoprolol due to bradycardia. Holter off metoprolol did not show any high rate episodes.   5. Hyperlipidemia: On Crestor, good lipids 12/16.   Followup in 4 months.  [name]  03/07/2016

## 2016-03-09 DIAGNOSIS — H2511 Age-related nuclear cataract, right eye: Secondary | ICD-10-CM | POA: Diagnosis not present

## 2016-03-16 ENCOUNTER — Ambulatory Visit (HOSPITAL_COMMUNITY)
Admission: RE | Admit: 2016-03-16 | Discharge: 2016-03-16 | Disposition: A | Discharge status: Home / Self Care | Payer: Medicare Other | Source: Ambulatory Visit | Attending: Cardiology | Admitting: Cardiology

## 2016-03-16 DIAGNOSIS — I313 Pericardial effusion (noninflammatory): Secondary | ICD-10-CM | POA: Diagnosis not present

## 2016-03-16 DIAGNOSIS — I5032 Chronic diastolic (congestive) heart failure: Secondary | ICD-10-CM | POA: Diagnosis not present

## 2016-03-16 DIAGNOSIS — I4891 Unspecified atrial fibrillation: Secondary | ICD-10-CM | POA: Insufficient documentation

## 2016-03-16 DIAGNOSIS — I272 Other secondary pulmonary hypertension: Secondary | ICD-10-CM | POA: Insufficient documentation

## 2016-03-16 DIAGNOSIS — I071 Rheumatic tricuspid insufficiency: Secondary | ICD-10-CM | POA: Insufficient documentation

## 2016-03-16 NOTE — Progress Notes (Signed)
*  PRELIMINARY RESULTS* Echocardiogram 2D Echocardiogram has been performed.  Leavy Cella 03/16/2016, 1:49 PM

## 2016-04-02 ENCOUNTER — Ambulatory Visit (INDEPENDENT_AMBULATORY_CARE_PROVIDER_SITE_OTHER): Payer: Medicare Other | Admitting: *Deleted

## 2016-04-02 DIAGNOSIS — Z5181 Encounter for therapeutic drug level monitoring: Secondary | ICD-10-CM

## 2016-04-02 DIAGNOSIS — I4891 Unspecified atrial fibrillation: Secondary | ICD-10-CM | POA: Diagnosis not present

## 2016-04-02 LAB — POCT INR: INR: 2.6

## 2016-04-08 ENCOUNTER — Other Ambulatory Visit (HOSPITAL_COMMUNITY): Payer: Self-pay | Admitting: Pharmacist

## 2016-04-08 MED ORDER — TREPROSTINIL 0.6 MG/ML IN SOLN: 54.0000 ug | Freq: Four times a day (QID) | RESPIRATORY_TRACT | 11 refills | Status: DC

## 2016-04-24 ENCOUNTER — Other Ambulatory Visit: Payer: Self-pay | Admitting: Cardiology

## 2016-04-30 DIAGNOSIS — Z23 Encounter for immunization: Secondary | ICD-10-CM | POA: Diagnosis not present

## 2016-05-14 ENCOUNTER — Ambulatory Visit (INDEPENDENT_AMBULATORY_CARE_PROVIDER_SITE_OTHER): Payer: Medicare Other

## 2016-05-14 DIAGNOSIS — I4891 Unspecified atrial fibrillation: Secondary | ICD-10-CM | POA: Diagnosis not present

## 2016-05-14 DIAGNOSIS — Z5181 Encounter for therapeutic drug level monitoring: Secondary | ICD-10-CM | POA: Diagnosis not present

## 2016-05-14 LAB — POCT INR: INR: 2.2

## 2016-06-01 DIAGNOSIS — E789 Disorder of lipoprotein metabolism, unspecified: Secondary | ICD-10-CM | POA: Diagnosis not present

## 2016-06-01 DIAGNOSIS — I4891 Unspecified atrial fibrillation: Secondary | ICD-10-CM | POA: Diagnosis not present

## 2016-06-01 DIAGNOSIS — I10 Essential (primary) hypertension: Secondary | ICD-10-CM | POA: Diagnosis not present

## 2016-06-01 DIAGNOSIS — I5040 Unspecified combined systolic (congestive) and diastolic (congestive) heart failure: Secondary | ICD-10-CM | POA: Diagnosis not present

## 2016-06-09 DIAGNOSIS — E789 Disorder of lipoprotein metabolism, unspecified: Secondary | ICD-10-CM | POA: Diagnosis not present

## 2016-06-09 DIAGNOSIS — I272 Pulmonary hypertension, unspecified: Secondary | ICD-10-CM | POA: Diagnosis not present

## 2016-06-09 DIAGNOSIS — I4891 Unspecified atrial fibrillation: Secondary | ICD-10-CM | POA: Diagnosis not present

## 2016-06-09 DIAGNOSIS — I10 Essential (primary) hypertension: Secondary | ICD-10-CM | POA: Diagnosis not present

## 2016-06-25 ENCOUNTER — Ambulatory Visit (INDEPENDENT_AMBULATORY_CARE_PROVIDER_SITE_OTHER): Payer: Medicare Other | Admitting: *Deleted

## 2016-06-25 DIAGNOSIS — I4891 Unspecified atrial fibrillation: Secondary | ICD-10-CM

## 2016-06-25 DIAGNOSIS — Z5181 Encounter for therapeutic drug level monitoring: Secondary | ICD-10-CM

## 2016-06-25 LAB — POCT INR: INR: 2.4

## 2016-06-26 ENCOUNTER — Other Ambulatory Visit: Payer: Self-pay | Admitting: Endocrinology

## 2016-06-26 DIAGNOSIS — Z1231 Encounter for screening mammogram for malignant neoplasm of breast: Secondary | ICD-10-CM

## 2016-07-02 DIAGNOSIS — R430 Anosmia: Secondary | ICD-10-CM | POA: Diagnosis not present

## 2016-07-07 ENCOUNTER — Other Ambulatory Visit: Payer: Self-pay | Admitting: Otolaryngology

## 2016-07-07 DIAGNOSIS — R430 Anosmia: Secondary | ICD-10-CM

## 2016-07-09 ENCOUNTER — Other Ambulatory Visit: Payer: Self-pay | Admitting: Endocrinology

## 2016-07-09 DIAGNOSIS — N63 Unspecified lump in unspecified breast: Secondary | ICD-10-CM | POA: Diagnosis not present

## 2016-07-09 DIAGNOSIS — N6324 Unspecified lump in the left breast, lower inner quadrant: Secondary | ICD-10-CM

## 2016-07-13 ENCOUNTER — Ambulatory Visit
Admission: RE | Admit: 2016-07-13 | Discharge: 2016-07-13 | Disposition: A | Discharge status: Home / Self Care | Payer: Medicare Other | Source: Ambulatory Visit | Attending: Otolaryngology | Admitting: Otolaryngology

## 2016-07-13 DIAGNOSIS — R430 Anosmia: Secondary | ICD-10-CM

## 2016-07-13 MED ORDER — IOPAMIDOL (ISOVUE-300) INJECTION 61%
75.0000 mL | Freq: Once | INTRAVENOUS | Status: AC | PRN
  Administered 2016-07-13: 75 mL via INTRAVENOUS

## 2016-07-15 ENCOUNTER — Other Ambulatory Visit: Payer: Medicare Other

## 2016-07-21 ENCOUNTER — Ambulatory Visit
Admission: RE | Admit: 2016-07-21 | Discharge: 2016-07-21 | Disposition: A | Discharge status: Home / Self Care | Payer: Medicare Other | Source: Ambulatory Visit | Attending: Endocrinology | Admitting: Endocrinology

## 2016-07-21 ENCOUNTER — Other Ambulatory Visit: Payer: Self-pay | Admitting: Endocrinology

## 2016-07-21 DIAGNOSIS — N6002 Solitary cyst of left breast: Secondary | ICD-10-CM | POA: Diagnosis not present

## 2016-07-21 DIAGNOSIS — N6322 Unspecified lump in the left breast, upper inner quadrant: Secondary | ICD-10-CM | POA: Diagnosis not present

## 2016-07-21 DIAGNOSIS — N6324 Unspecified lump in the left breast, lower inner quadrant: Secondary | ICD-10-CM

## 2016-08-04 ENCOUNTER — Ambulatory Visit: Payer: Self-pay | Admitting: General Surgery

## 2016-08-04 DIAGNOSIS — N6320 Unspecified lump in the left breast, unspecified quadrant: Secondary | ICD-10-CM | POA: Diagnosis not present

## 2016-08-06 ENCOUNTER — Ambulatory Visit (INDEPENDENT_AMBULATORY_CARE_PROVIDER_SITE_OTHER): Payer: Medicare Other | Admitting: *Deleted

## 2016-08-06 DIAGNOSIS — Z5181 Encounter for therapeutic drug level monitoring: Secondary | ICD-10-CM

## 2016-08-06 DIAGNOSIS — I4891 Unspecified atrial fibrillation: Secondary | ICD-10-CM | POA: Diagnosis not present

## 2016-08-06 LAB — POCT INR: INR: 2.2

## 2016-08-25 ENCOUNTER — Telehealth (HOSPITAL_COMMUNITY): Payer: Self-pay | Admitting: *Deleted

## 2016-08-25 DIAGNOSIS — H25813 Combined forms of age-related cataract, bilateral: Secondary | ICD-10-CM | POA: Diagnosis not present

## 2016-08-25 DIAGNOSIS — H3561 Retinal hemorrhage, right eye: Secondary | ICD-10-CM | POA: Diagnosis not present

## 2016-08-25 DIAGNOSIS — H35073 Retinal telangiectasis, bilateral: Secondary | ICD-10-CM | POA: Diagnosis not present

## 2016-08-25 DIAGNOSIS — H54414A Blindness right eye category 4, normal vision left eye: Secondary | ICD-10-CM | POA: Diagnosis not present

## 2016-08-25 DIAGNOSIS — H40013 Open angle with borderline findings, low risk, bilateral: Secondary | ICD-10-CM | POA: Diagnosis not present

## 2016-08-25 DIAGNOSIS — H35051 Retinal neovascularization, unspecified, right eye: Secondary | ICD-10-CM | POA: Diagnosis not present

## 2016-08-25 DIAGNOSIS — H35351 Cystoid macular degeneration, right eye: Secondary | ICD-10-CM | POA: Diagnosis not present

## 2016-08-25 NOTE — Telephone Encounter (Signed)
Surgical clearance faxed to [facility] @ [phone] as requested.  Patient is able to hold warfarin for 5 days prior to procedure and should restart afterwards per Dr. Aundra Dubin.

## 2016-09-17 ENCOUNTER — Ambulatory Visit (INDEPENDENT_AMBULATORY_CARE_PROVIDER_SITE_OTHER): Payer: Medicare Other | Admitting: *Deleted

## 2016-09-17 DIAGNOSIS — Z5181 Encounter for therapeutic drug level monitoring: Secondary | ICD-10-CM

## 2016-09-17 DIAGNOSIS — I4891 Unspecified atrial fibrillation: Secondary | ICD-10-CM

## 2016-09-17 LAB — POCT INR: INR: 2.2

## 2016-09-17 NOTE — Patient Instructions (Addendum)
Per Dr. Aundra Dubin you can hold Coumadin for 5 days. Last dose will be 10/09/16. Resume Coumadin after procedure as instructed by Dr. Marlou Starks. Call Coumadin if anything changes or any questions [phone]

## 2016-10-05 ENCOUNTER — Ambulatory Visit (HOSPITAL_COMMUNITY)
Admission: RE | Admit: 2016-10-05 | Discharge: 2016-10-05 | Disposition: A | Discharge status: Home / Self Care | Payer: Medicare Other | Source: Ambulatory Visit | Attending: Cardiology | Admitting: Cardiology

## 2016-10-05 ENCOUNTER — Encounter (HOSPITAL_COMMUNITY): Payer: Self-pay

## 2016-10-05 VITALS — BP 133/70 | HR 73 | Wt 172.8 lb

## 2016-10-05 DIAGNOSIS — E785 Hyperlipidemia, unspecified: Secondary | ICD-10-CM | POA: Diagnosis not present

## 2016-10-05 DIAGNOSIS — R001 Bradycardia, unspecified: Secondary | ICD-10-CM | POA: Diagnosis not present

## 2016-10-05 DIAGNOSIS — I4891 Unspecified atrial fibrillation: Secondary | ICD-10-CM

## 2016-10-05 DIAGNOSIS — I2721 Secondary pulmonary arterial hypertension: Secondary | ICD-10-CM | POA: Insufficient documentation

## 2016-10-05 DIAGNOSIS — I5032 Chronic diastolic (congestive) heart failure: Secondary | ICD-10-CM | POA: Diagnosis not present

## 2016-10-05 DIAGNOSIS — I272 Pulmonary hypertension, unspecified: Secondary | ICD-10-CM

## 2016-10-05 DIAGNOSIS — D696 Thrombocytopenia, unspecified: Secondary | ICD-10-CM | POA: Insufficient documentation

## 2016-10-05 DIAGNOSIS — I482 Chronic atrial fibrillation: Secondary | ICD-10-CM | POA: Diagnosis not present

## 2016-10-05 DIAGNOSIS — I110 Hypertensive heart disease with heart failure: Secondary | ICD-10-CM | POA: Insufficient documentation

## 2016-10-05 DIAGNOSIS — Z7901 Long term (current) use of anticoagulants: Secondary | ICD-10-CM | POA: Diagnosis not present

## 2016-10-05 DIAGNOSIS — I313 Pericardial effusion (noninflammatory): Secondary | ICD-10-CM | POA: Insufficient documentation

## 2016-10-05 LAB — CBC
HEMATOCRIT: 46.1 % — AB (ref 36.0–46.0)
Hemoglobin: 14.6 g/dL (ref 12.0–15.0)
MCH: 30.9 pg (ref 26.0–34.0)
MCHC: 31.7 g/dL (ref 30.0–36.0)
MCV: 97.7 fL (ref 78.0–100.0)
Platelets: 87 10*3/uL — ABNORMAL LOW (ref 150–400)
RBC: 4.72 MIL/uL (ref 3.87–5.11)
RDW: 14.3 % (ref 11.5–15.5)
WBC: 5.8 10*3/uL (ref 4.0–10.5)

## 2016-10-05 LAB — BASIC METABOLIC PANEL
Anion gap: 7 (ref 5–15)
BUN: 16 mg/dL (ref 6–20)
CHLORIDE: 103 mmol/L (ref 101–111)
CO2: 31 mmol/L (ref 22–32)
Calcium: 9.7 mg/dL (ref 8.9–10.3)
Creatinine, Ser: 1.05 mg/dL — ABNORMAL HIGH (ref 0.44–1.00)
GFR calc Af Amer: 59 mL/min — ABNORMAL LOW (ref >60)
GFR calc non Af Amer: 51 mL/min — ABNORMAL LOW (ref >60)
GLUCOSE: 95 mg/dL (ref 65–99)
POTASSIUM: 4.1 mmol/L (ref 3.5–5.1)
SODIUM: 141 mmol/L (ref 135–145)

## 2016-10-05 LAB — BRAIN NATRIURETIC PEPTIDE: B Natriuretic Peptide: 646 pg/mL — ABNORMAL HIGH (ref 0.0–100.0)

## 2016-10-05 NOTE — Progress Notes (Signed)
6 min walk  Patient ambulated 1200 feet in 6 mins.  Oxygen averaged between 92%-94% on 4-Liters of oxygen.  HR ranged between 120-140.

## 2016-10-05 NOTE — Progress Notes (Signed)
Patient ID: Vicki [name], female   DOB: 1940-07-03, 76 y.o.   MRN: [id number] PCP: Dr. Wilson Singer Cardiology: Dr. Aundra Dubin  Vicki [name] is a 75 yo with history of chronic diastolic CHF and chronic atrial fibrillation presents for followup of pulmonary hypertension.  Patient was followed by Dr. Glade Lloyd in the past for chronic atrial fibrillation.  She has been on coumadin.  She reports progressive exertional dyspnea since 2011.  This gradually worsened and became quite significant over the last few months.  She used to have significant HTN, but more recently her BP has been on the lower side.  Echo was done in 2/14, showing severe concentric LVH with EF 55-60%, moderately dilated RV, moderate to severe TR, and PA systolic pressure 86 mmHg.  I did a right heart cath in 5/14.  This showed PA pressure 104/36 with PCWP 20, suggesting pulmonary arterial HTN well out of proportion to the mildly elevate wedge pressure.  She was already on amlodipine so I did not do vasodilator testing.  V/Q scan was done, showing no evidence for chronic PEs.  PFTs showed a restrictive defect. Cardiac MRI did not show definite evidence for amyloid.  I started her on macitentan 10 mg daily.  Initially, she felt better on macitentan.  However, she was admitted in 5/14 from her sleep study due to orthopnea and dyspnea.  She was diuresed for several days and diuretic was switched over to torsemide.  I next started her on tadalafil 20 mg daily and titrated up to 40 mg daily.  She thinks that this helped.  She saw pulmonary after chest CT (showed mosaic attenuation in lungs).  This was thought to be due to air-trapping rather than ILD.  She was started on Spiriva. She wears oxygen at home.   She had an echocardiogram in 2/15 that showed severe LVH, EF 75%, small pericardial effusion, mildly dilated RV with mildly decreased systolic function, PA systolic pressure 84 mmHg.  She is not totally sure if she was taking macitentan and tadalafil at that time.   She has had a hard month.  She ran out of her macitentan and tadalafil and her insurance company refused to refill them.  After this, she took a decided turn for the worse.  [place] in [place].  She became much more short of breath, just with walking around the house. She developed lightheaded spells, especially with micturation.  Given lightheadedness and presyncope, she was admitted last week.  She was restarted on her medications and she finally got back on her meds at home.  In the hospital, she was noted to be bradycardic with HR to 30s so metoprolol was stopped.  Of note, her abdominal fat pad biopsy did not suggest amyloidosis. Repeat RHC in 3/15 still with moderate to severe PAH and low CI.  Holter off beta blocker in 3/15 showed average HR 73.   Echo in 9/17 showed EF 65-70% with moderate to severe LVH, moderate MR, normal RV size with mildly decreased systolic function, PASP 56 mmHg, small pericardial effusion.   She presents today for HF/PAH follow up. On Tyvaso, tadalafil, and macitentan for pulmonary hypertension, able to get all meds without problems. Weight is up 3 lbs. No chest pain, lightheadedness, syncope.  No orthopnea/PND. She gets mildly short of breath with stairs but can walk as long as she wants on flat ground. Using oxygen with exertion and at night.  She cooks and cleans (vacuuming, mopping) and does some yardwork. Mild dyspnea with vacuuming (unchanged).  6 minute walk was done today, showed improvement.   6 minute walk (5/14): 122 m.   6 minute walk (7/14): 152 m 6 minute walk (10/14): 183 m 6 minute walk (4/15): 317 m 6 minute walk (12/15): 259 m 6 minute walk (4/16): 293 m 6 minute walk (8/16): 341 m 6 minute walk (12/16): 274 m 6 minute walk (6/17): 314 m 6 minute walk (9/17): 307 m 6 minute walk (4/18): 366 m  Labs (12/13): HCT 45.1, plts 153, K 3.5, creatinine 1.0 Labs (1/14): BNP  849 Labs (4/14): K 3.1, creatinine 1.0, ANA and anti-SCL-70 antibody negative.  Rheumatoid factor negative.  Serum immunofixation did not show monoclonal light chains.  Labs (5/14): K 3.3, creatinine 0.79, proBNP 5147 Labs (6/14): K 4, creatinine 0.9, BNP 1001 Labs (10/14): LDL 53, LDL-P 975, K 3.7, creatinine 1.2 Labs (11/14): K 3.7, creatinine 0.9, BNP 832 Labs (2/15): K 3.5, creatinine 1.10, HCT 42.6, UPEP negative, SPEP negative.  Labs (3/15): K 3.8, creatinine 0.88 Labs (4/15): K 3.7, creatinine 0.99, proBNP 1653 Labs (7/15): K 4, creatinine 0.93 Labs (12/15): LDL 77, HDL 58, K 3.9, creatinine 1.03, LFTs normal Labs (4/16): K 3.8, creatinine 0.93, BNP 475, plts 105, HCT 42.3 Labs (11/16): K 3.6, creatinine 1.1, HCT 42.2 Labs (12/16): LDL 54, HDL 50, BNP 628 Labs (6/17): K 3.7, creatinine 0.97, HCT 41.3, BNP 489 Labs (9/17): K 3.8, creatinine 0.97, LDL 65, HDL 54  PMH: 1. Chronic diastolic CHF: Echo (3/41) with EF 55-60%, severe LVH (no SAM, no asymmetric hypertrophy, no LVOT gradient), moderate-severe LAE, moderately dilated RV with mildly decreased systolic function, moderate to severe RAE, PA systolic pressure 86 mmHg, moderate-severe TR, moderate MR, trivial pericardial effusion.  Cardiac MRI (5/14): EF 65%, severe LVH, no definite evidence for amyloidosis (no delayed enhancement, myocardium not difficult to null).  Echo (2/15) with EF 75%, severe LVH, grade II diastolic dysfunction, moderate MR, RV mildly dilated with mildly decreased systolic function, moderate TR, PA systolic pressure 84 mmHg, small pericardial effusion. Abdominal fat pad biopsy (2/15) showed no evidence for amyloidosis.  SPEP/UPEP negative 2/15.  - Echo (4/16) with EF 60-65%, severe LVH, RV mildly dilated with mildly decreased systolic function, PA systolic pressure 40 mmHg. - Echo (4/18) with EF 65-70%, severe LVH, moderate MR, normal RV size with mildly decreased systolic function, PASP 56 mmHg, small pericardial  effusion.  2. Chronic atrial fibrillation since around 2004.  Developed bradycardia and metoprolol stopped 2/15. Holter (3/15) with average HR 73, atrial fibrillation, 3.8 sec pause x 1 while asleep, PVCs.  3. HTN: For decades.  4. LHC (2/08) with no significant disease.  5. Chronic thrombocytopenia: ITP 6. Pulmonary arterial HTN: RHC (5/14) with mean RA 13, PA 104/36 (mean 63), mean PCWP 20 on right and 23 on left, CI 2.3 (Fick) and 1.6 (thermo), PVR 10.4 WU (Fick) and 15 WU (thermo).  Vasodilator testing not done as patient was already on amlodipine.  V/Q scan (5/14) with no evidence for chronic PE.  ANA, RF, and anti-SCL70 antibody negative.  PFTs (5/14) with FEV1 60%, FVC 54%, ratio 112%, TLC 61%, DLCO 43% => restrictive defect.  Sleep study (7/14) with no OSA.  CT chest with areas of mosaic attenuation in lungs (saw pulmonary, thought air trapping and not ILD). RHC (3/15) with RA mean 5, PA 66/31 mean 43, PCWP mean 11, Cardiac Index (Fick) 1.97, PVR 9.2 WU.  Patient was started on Tyvaso in 3/15.  Echo (4/16) with mildly dilated and mildly dysfunctional RV, PA systolic pressure 40 mmHg.   SH: Married, retired from a bank, 3 children, lives in [place].   FH: No heart disease, +HTN.  ROS: All systems reviewed and negative except as per HPI.   Current Outpatient Prescriptions  Medication Sig Dispense Refill  . acetaminophen (TYLENOL) 500 MG tablet Take 500 mg by mouth every 6 (six) hours as needed for mild pain or headache.     Marland Kitchen amLODipine (NORVASC) 2.5 MG tablet TAKE ONE TABLET BY MOUTH ONCE DAILY 30 tablet 0  . KLOR-CON M20 20 MEQ tablet TAKE TWO TABLETS BY MOUTH IN THE MORNING AND ONE IN THE EVENING 90 tablet 0  . Macitentan (OPSUMIT) 10 MG TABS Take 1 tablet by mouth daily with breakfast. 30 tablet 6  . rosuvastatin (CRESTOR) 20 MG tablet Take 20 mg by mouth at bedtime.     . Tadalafil, PAH, 20 MG TABS Take 2 tablets (40 mg total) by mouth daily. 180 tablet 3  . torsemide (DEMADEX) 20  MG tablet Take 1.5 tablets (30 mg total) by mouth 2 (two) times daily. 90 tablet 6  . Treprostinil (TYVASO) 0.6 MG/ML SOLN Inhale 54 mcg into the lungs 4 (four) times daily. 30 mL 11  . warfarin (COUMADIN) 5 MG tablet TAKE AS DIRECTED BY  COUMADIN  CLINIC 100 tablet 1   No current facility-administered medications for this encounter.     BP 133/70   Pulse 73   Wt 172 lb 12 oz (78.4 kg)   SpO2 95%   BMI 31.60 kg/m  General: NAD Neck: JVP not elevated, no thyromegaly or thyroid nodule.  Lungs: Slight dry crackles at bases bilaterally otherwise clear CV: Nondisplaced PMI.  Heart irregular S1/S2, 2/6 HSM LLSB/apex.  1+ ankle edema.  No carotid bruit.  Normal pedal pulses.  Abdomen: Soft, nontender, no hepatosplenomegaly, no distention.  Neurologic: Alert and oriented x 3.  Psych: Normal affect. Extremities: No clubbing or cyanosis.   Assessment/Plan: 1. Pulmonary HTN: Patient has severe pulmonary arterial HTN.  Last RHC in 3/15 showed CI 1.97, PVR 9.  It is possible that long-standing PCWP elevation from diastolic LV dysfunction could lead to pulmonary vascular changes with pulmonary arterial hypertension out of proportion to the PCWP elevation.  However, suspect idiopathic primary pulmonary HTN (Group 1). Collagen vascular disease workup was negative (negative RF, ANA and negative anti-SCL-70).  V/Q scan was not suggestive of chronic PEs.  PFTs were suggestive of restrictive lung disease but CT chest and evaluation by pulmonary did not suggest interstitial lung disease.  Sleep study did not show OSA.  Last echo in 9/17 showed mildly dysfunctional RV with PA systolic pressure 56 mmHg.  6 minute walk today was improved.   - Continue oxygen at night and with exertion as needed  - Continue Tyvaso, macitentan, Adcirca.  She is doing well on this regimen and is no longer having trouble getting her meds. 2. Chronic diastolic CHF: EF preserved on echo with severe concentric LVH and a small  pericardial effusion.  It is possible that the LVH is due to years of HTN.  The cardiac MRI was not definitively suggestive of cardiac amyloidosis.  I was still concerned for amyloidosis given the appearance of the LV myocardium on echoes.  SPEP/UPEP negative.  Abdominal fat pad biopsy showed no evidence for amyloidosis.  - Continue torsemide 30 mg bid, looks euvolemic. BMET today.  3. HTN:  BP stable.  Can continue current dose of amlodipine.  4. Chronic atrial fibrillation: Continue coumadin.  She is off metoprolol due to bradycardia. Holter off metoprolol did not show any high rate episodes.  CBC today.  5. Hyperlipidemia: On Crestor, good lipids 9/17.   Followup in 4 months.  [name]  10/05/2016

## 2016-10-05 NOTE — Patient Instructions (Signed)
Labs today  We will contact you in 4 months to schedule your next appointment.  

## 2016-10-15 ENCOUNTER — Encounter (HOSPITAL_BASED_OUTPATIENT_CLINIC_OR_DEPARTMENT_OTHER): Payer: Self-pay

## 2016-10-15 ENCOUNTER — Ambulatory Visit (HOSPITAL_BASED_OUTPATIENT_CLINIC_OR_DEPARTMENT_OTHER): Admit: 2016-10-15 | Payer: Medicare Other | Admitting: General Surgery

## 2016-10-15 SURGERY — MINOR EXCISION OF MASS
Anesthesia: LOCAL | Site: Breast | Laterality: Left

## 2016-10-27 ENCOUNTER — Other Ambulatory Visit: Payer: Self-pay | Admitting: General Surgery

## 2016-10-27 DIAGNOSIS — N6320 Unspecified lump in the left breast, unspecified quadrant: Secondary | ICD-10-CM | POA: Diagnosis not present

## 2016-10-29 ENCOUNTER — Ambulatory Visit (INDEPENDENT_AMBULATORY_CARE_PROVIDER_SITE_OTHER): Payer: Medicare Other | Admitting: *Deleted

## 2016-10-29 DIAGNOSIS — I4891 Unspecified atrial fibrillation: Secondary | ICD-10-CM | POA: Diagnosis not present

## 2016-10-29 DIAGNOSIS — Z5181 Encounter for therapeutic drug level monitoring: Secondary | ICD-10-CM | POA: Diagnosis not present

## 2016-10-29 LAB — POCT INR: INR: 2

## 2016-11-03 ENCOUNTER — Other Ambulatory Visit: Payer: Self-pay | Admitting: General Surgery

## 2016-11-03 ENCOUNTER — Ambulatory Visit
Admission: RE | Admit: 2016-11-03 | Discharge: 2016-11-03 | Disposition: A | Discharge status: Home / Self Care | Payer: Medicare Other | Source: Ambulatory Visit | Attending: General Surgery | Admitting: General Surgery

## 2016-11-03 DIAGNOSIS — N6320 Unspecified lump in the left breast, unspecified quadrant: Secondary | ICD-10-CM

## 2016-11-03 DIAGNOSIS — N6489 Other specified disorders of breast: Secondary | ICD-10-CM | POA: Diagnosis not present

## 2016-11-03 DIAGNOSIS — R928 Other abnormal and inconclusive findings on diagnostic imaging of breast: Secondary | ICD-10-CM | POA: Diagnosis not present

## 2016-11-23 ENCOUNTER — Other Ambulatory Visit: Payer: Self-pay | Admitting: Cardiology

## 2016-12-10 ENCOUNTER — Ambulatory Visit (INDEPENDENT_AMBULATORY_CARE_PROVIDER_SITE_OTHER): Payer: Medicare Other | Admitting: *Deleted

## 2016-12-10 DIAGNOSIS — I4891 Unspecified atrial fibrillation: Secondary | ICD-10-CM

## 2016-12-10 DIAGNOSIS — Z5181 Encounter for therapeutic drug level monitoring: Secondary | ICD-10-CM | POA: Diagnosis not present

## 2016-12-10 LAB — POCT INR: INR: 2.5

## 2016-12-16 ENCOUNTER — Telehealth (HOSPITAL_COMMUNITY): Payer: Self-pay | Admitting: Vascular Surgery

## 2016-12-16 NOTE — Telephone Encounter (Signed)
Pt need refill Opsumit sent to [name]

## 2016-12-17 MED ORDER — MACITENTAN 10 MG PO TABS: 1.0000 | ORAL_TABLET | Freq: Every day | ORAL | 6 refills | Status: DC

## 2016-12-24 ENCOUNTER — Other Ambulatory Visit (HOSPITAL_COMMUNITY): Payer: Self-pay | Admitting: *Deleted

## 2016-12-24 MED ORDER — MACITENTAN 10 MG PO TABS: 1.0000 | ORAL_TABLET | Freq: Every day | ORAL | 6 refills | Status: DC

## 2016-12-31 DIAGNOSIS — Z961 Presence of intraocular lens: Secondary | ICD-10-CM | POA: Diagnosis not present

## 2016-12-31 DIAGNOSIS — H401112 Primary open-angle glaucoma, right eye, moderate stage: Secondary | ICD-10-CM | POA: Diagnosis not present

## 2016-12-31 DIAGNOSIS — H35351 Cystoid macular degeneration, right eye: Secondary | ICD-10-CM | POA: Diagnosis not present

## 2016-12-31 DIAGNOSIS — H40012 Open angle with borderline findings, low risk, left eye: Secondary | ICD-10-CM | POA: Diagnosis not present

## 2016-12-31 DIAGNOSIS — H25812 Combined forms of age-related cataract, left eye: Secondary | ICD-10-CM | POA: Diagnosis not present

## 2016-12-31 DIAGNOSIS — H35371 Puckering of macula, right eye: Secondary | ICD-10-CM | POA: Diagnosis not present

## 2017-01-14 ENCOUNTER — Other Ambulatory Visit (HOSPITAL_COMMUNITY): Payer: Self-pay | Admitting: *Deleted

## 2017-01-14 DIAGNOSIS — I272 Pulmonary hypertension, unspecified: Secondary | ICD-10-CM

## 2017-01-14 MED ORDER — TADALAFIL (PAH) 20 MG PO TABS: 40.0000 mg | ORAL_TABLET | Freq: Every day | ORAL | 3 refills | Status: DC

## 2017-01-19 DIAGNOSIS — E7800 Pure hypercholesterolemia, unspecified: Secondary | ICD-10-CM | POA: Diagnosis not present

## 2017-01-19 DIAGNOSIS — I4891 Unspecified atrial fibrillation: Secondary | ICD-10-CM | POA: Diagnosis not present

## 2017-01-19 DIAGNOSIS — I272 Pulmonary hypertension, unspecified: Secondary | ICD-10-CM | POA: Diagnosis not present

## 2017-01-19 DIAGNOSIS — I10 Essential (primary) hypertension: Secondary | ICD-10-CM | POA: Diagnosis not present

## 2017-01-21 ENCOUNTER — Ambulatory Visit (INDEPENDENT_AMBULATORY_CARE_PROVIDER_SITE_OTHER): Payer: Medicare Other | Admitting: *Deleted

## 2017-01-21 DIAGNOSIS — Z5181 Encounter for therapeutic drug level monitoring: Secondary | ICD-10-CM | POA: Diagnosis not present

## 2017-01-21 DIAGNOSIS — I4891 Unspecified atrial fibrillation: Secondary | ICD-10-CM | POA: Diagnosis not present

## 2017-01-21 LAB — POCT INR: INR: 2.3

## 2017-01-25 DIAGNOSIS — I4891 Unspecified atrial fibrillation: Secondary | ICD-10-CM | POA: Diagnosis not present

## 2017-01-25 DIAGNOSIS — I509 Heart failure, unspecified: Secondary | ICD-10-CM | POA: Diagnosis not present

## 2017-01-25 DIAGNOSIS — I10 Essential (primary) hypertension: Secondary | ICD-10-CM | POA: Diagnosis not present

## 2017-02-01 DIAGNOSIS — N6320 Unspecified lump in the left breast, unspecified quadrant: Secondary | ICD-10-CM | POA: Diagnosis not present

## 2017-02-09 ENCOUNTER — Encounter (HOSPITAL_COMMUNITY): Payer: Medicare Other | Admitting: Cardiology

## 2017-02-22 DIAGNOSIS — H40013 Open angle with borderline findings, low risk, bilateral: Secondary | ICD-10-CM | POA: Diagnosis not present

## 2017-02-22 DIAGNOSIS — H35051 Retinal neovascularization, unspecified, right eye: Secondary | ICD-10-CM | POA: Diagnosis not present

## 2017-02-22 DIAGNOSIS — H35073 Retinal telangiectasis, bilateral: Secondary | ICD-10-CM | POA: Diagnosis not present

## 2017-02-22 DIAGNOSIS — H25813 Combined forms of age-related cataract, bilateral: Secondary | ICD-10-CM | POA: Diagnosis not present

## 2017-02-22 DIAGNOSIS — H35351 Cystoid macular degeneration, right eye: Secondary | ICD-10-CM | POA: Diagnosis not present

## 2017-03-04 ENCOUNTER — Ambulatory Visit (INDEPENDENT_AMBULATORY_CARE_PROVIDER_SITE_OTHER): Payer: Medicare Other | Admitting: *Deleted

## 2017-03-04 DIAGNOSIS — I4891 Unspecified atrial fibrillation: Secondary | ICD-10-CM

## 2017-03-04 DIAGNOSIS — Z5181 Encounter for therapeutic drug level monitoring: Secondary | ICD-10-CM

## 2017-03-04 LAB — POCT INR: INR: 2.2

## 2017-03-10 ENCOUNTER — Telehealth (HOSPITAL_COMMUNITY): Payer: Self-pay | Admitting: Pharmacist

## 2017-03-10 NOTE — Telephone Encounter (Signed)
Adcirca PA approved by [facility] Part D through 03/10/19.   Ruta Hinds. Velva Harman, PharmD, BCPS, CPP Clinical Pharmacist Pager: [phone] Phone: [phone] 03/10/2017 9:21 AM

## 2017-03-29 ENCOUNTER — Ambulatory Visit (HOSPITAL_COMMUNITY)
Admission: RE | Admit: 2017-03-29 | Discharge: 2017-03-29 | Disposition: A | Discharge status: Home / Self Care | Payer: Medicare Other | Source: Ambulatory Visit | Attending: Cardiology | Admitting: Cardiology

## 2017-03-29 ENCOUNTER — Encounter (HOSPITAL_COMMUNITY): Payer: Self-pay | Admitting: Cardiology

## 2017-03-29 ENCOUNTER — Telehealth (HOSPITAL_COMMUNITY): Payer: Self-pay | Admitting: Cardiology

## 2017-03-29 ENCOUNTER — Telehealth (HOSPITAL_COMMUNITY): Payer: Self-pay | Admitting: Vascular Surgery

## 2017-03-29 VITALS — BP 150/79 | HR 76 | Wt 173.5 lb

## 2017-03-29 DIAGNOSIS — I482 Chronic atrial fibrillation, unspecified: Secondary | ICD-10-CM

## 2017-03-29 DIAGNOSIS — R001 Bradycardia, unspecified: Secondary | ICD-10-CM | POA: Insufficient documentation

## 2017-03-29 DIAGNOSIS — I2721 Secondary pulmonary arterial hypertension: Secondary | ICD-10-CM | POA: Diagnosis not present

## 2017-03-29 DIAGNOSIS — E785 Hyperlipidemia, unspecified: Secondary | ICD-10-CM | POA: Diagnosis not present

## 2017-03-29 DIAGNOSIS — Z7901 Long term (current) use of anticoagulants: Secondary | ICD-10-CM | POA: Diagnosis not present

## 2017-03-29 DIAGNOSIS — I313 Pericardial effusion (noninflammatory): Secondary | ICD-10-CM | POA: Insufficient documentation

## 2017-03-29 DIAGNOSIS — I272 Pulmonary hypertension, unspecified: Secondary | ICD-10-CM

## 2017-03-29 DIAGNOSIS — E859 Amyloidosis, unspecified: Secondary | ICD-10-CM | POA: Diagnosis not present

## 2017-03-29 DIAGNOSIS — E038 Other specified hypothyroidism: Secondary | ICD-10-CM | POA: Diagnosis not present

## 2017-03-29 DIAGNOSIS — Z79899 Other long term (current) drug therapy: Secondary | ICD-10-CM | POA: Insufficient documentation

## 2017-03-29 DIAGNOSIS — D696 Thrombocytopenia, unspecified: Secondary | ICD-10-CM | POA: Insufficient documentation

## 2017-03-29 DIAGNOSIS — I110 Hypertensive heart disease with heart failure: Secondary | ICD-10-CM | POA: Insufficient documentation

## 2017-03-29 DIAGNOSIS — I5032 Chronic diastolic (congestive) heart failure: Secondary | ICD-10-CM | POA: Diagnosis not present

## 2017-03-29 LAB — COMPREHENSIVE METABOLIC PANEL
ALK PHOS: 37 U/L — AB (ref 38–126)
ALT: 14 U/L (ref 14–54)
ANION GAP: 8 (ref 5–15)
AST: 28 U/L (ref 15–41)
Albumin: 4.2 g/dL (ref 3.5–5.0)
BILIRUBIN TOTAL: 0.5 mg/dL (ref 0.3–1.2)
BUN: 16 mg/dL (ref 6–20)
CO2: 31 mmol/L (ref 22–32)
Calcium: 9.2 mg/dL (ref 8.9–10.3)
Chloride: 101 mmol/L (ref 101–111)
Creatinine, Ser: 0.97 mg/dL (ref 0.44–1.00)
GFR, EST NON AFRICAN AMERICAN: 55 mL/min — AB (ref >60)
Glucose, Bld: 83 mg/dL (ref 65–99)
POTASSIUM: 3.5 mmol/L (ref 3.5–5.1)
Sodium: 140 mmol/L (ref 135–145)
TOTAL PROTEIN: 7.5 g/dL (ref 6.5–8.1)

## 2017-03-29 LAB — LIPID PANEL
Cholesterol: 135 mg/dL (ref 0–200)
HDL: 48 mg/dL (ref >40)
LDL Cholesterol: 73 mg/dL (ref 0–99)
TRIGLYCERIDES: 70 mg/dL (ref <150)
Total CHOL/HDL Ratio: 2.8 RATIO
VLDL: 14 mg/dL (ref 0–40)

## 2017-03-29 LAB — CBC
HCT: 40.6 % (ref 36.0–46.0)
Hemoglobin: 12.5 g/dL (ref 12.0–15.0)
MCH: 29.6 pg (ref 26.0–34.0)
MCHC: 30.8 g/dL (ref 30.0–36.0)
MCV: 96.2 fL (ref 78.0–100.0)
PLATELETS: 91 10*3/uL — AB (ref 150–400)
RBC: 4.22 MIL/uL (ref 3.87–5.11)
RDW: 14.3 % (ref 11.5–15.5)
WBC: 4 10*3/uL (ref 4.0–10.5)

## 2017-03-29 LAB — BRAIN NATRIURETIC PEPTIDE: B Natriuretic Peptide: 588 pg/mL — ABNORMAL HIGH (ref 0.0–100.0)

## 2017-03-29 LAB — TSH: TSH: 2.181 u[IU]/mL (ref 0.350–4.500)

## 2017-03-29 NOTE — Progress Notes (Signed)
Pt completed 6 min walk test. Pt walked 1050 ft ( 320 meters) on 4LNC. Pt. O2 sats ranged from 94-97% and HR ranged from 97-108.

## 2017-03-29 NOTE — Progress Notes (Addendum)
Patient ID: Vicki [name], female   DOB: 22-Jul-1940, 76 y.o.   MRN: [id number] PCP: Dr. Shelia Media Cardiology: Dr. Aundra Dubin  Vicki [name] is a 76 y.o. with history of chronic diastolic CHF and chronic atrial fibrillation presents for followup of pulmonary hypertension and CHF.  Patient was followed by Dr. Glade Lloyd in the past for chronic atrial fibrillation.  She has been on coumadin.  She reports progressive exertional dyspnea since 2011.  This gradually worsened and became quite significant over the last few months.  She used to have significant HTN, but more recently her BP has been on the lower side.  Echo was done in 2/14, showing severe concentric LVH with EF 55-60%, moderately dilated RV, moderate to severe TR, and PA systolic pressure 86 mmHg.  I did a right heart cath in 5/14.  This showed PA pressure 104/36 with PCWP 20, suggesting pulmonary arterial HTN well out of proportion to the mildly elevate wedge pressure.  She was already on amlodipine so I did not do vasodilator testing.  V/Q scan was done, showing no evidence for chronic PEs.  PFTs showed a restrictive defect. Cardiac MRI did not show definite evidence for amyloid.  I started her on macitentan 10 mg daily.  Initially, she felt better on macitentan.  However, she was admitted in 5/14 from her sleep study due to orthopnea and dyspnea.  She was diuresed for several days and diuretic was switched over to torsemide.  I next started her on tadalafil 20 mg daily and titrated up to 40 mg daily.  She thinks that this helped.  She saw pulmonary after chest CT (showed mosaic attenuation in lungs).  This was thought to be due to air-trapping rather than ILD.  She was started on Spiriva. She wears oxygen at home.   She had an echocardiogram in 2/15 that showed severe LVH, EF 75%, small pericardial effusion, mildly dilated RV with mildly decreased systolic function, PA systolic pressure 84 mmHg.  She is not totally sure if she was taking macitentan and tadalafil at  that time.  She has had a hard month.  She ran out of her macitentan and tadalafil and her insurance company refused to refill them.  After this, she took a decided turn for the worse.  [place] in the fall.  She became much more short of breath, just with walking around the house. She developed lightheaded spells, especially with micturation.  Given lightheadedness and presyncope, she was admitted last week.  She was restarted on her medications and she finally got back on her meds at home.  In the hospital, she was noted to be bradycardic with HR to 30s so metoprolol was stopped.  Of note, her abdominal fat pad biopsy did not suggest amyloidosis. Repeat RHC in 3/15 still with moderate to severe PAH and low CI.  Holter off beta blocker in 3/15 showed average HR 73.   Echo in 9/17 showed EF 65-70% with moderate to severe LVH, moderate MR, normal RV size with mildly decreased systolic function, PASP 56 mmHg, small pericardial effusion.   She presents today for HF/PAH follow up. On Tyvaso, tadalafil, and macitentan for pulmonary hypertension, able to get all meds without problems. Weight is stable.  BP is high today but she just took her am meds.  SBP usually 120s-130s.  Stable breathing, dyspneic only with inclines.  No orthopnea/PND.  No palpitations/syncope/lightheadedness.    6 minute  walk (5/14): 122 m.   6 minute walk (7/14): 152 m 6 minute walk (10/14): 183 m 6 minute walk (4/15): 317 m 6 minute walk (12/15): 259 m 6 minute walk (4/16): 293 m 6 minute walk (8/16): 341 m 6 minute walk (12/16): 274 m 6 minute walk (6/17): 314 m 6 minute walk (9/17): 307 m 6 minute walk (4/18): 366 m 6 minute walk (10/18): 320 m  Labs (12/13): HCT 45.1, plts 153, K 3.5, creatinine 1.0 Labs (1/14): BNP 849 Labs (4/14): K 3.1, creatinine 1.0, ANA and anti-SCL-70 antibody negative.  Rheumatoid factor negative.  Serum immunofixation did not  show monoclonal light chains.  Labs (5/14): K 3.3, creatinine 0.79, proBNP 5147 Labs (6/14): K 4, creatinine 0.9, BNP 1001 Labs (10/14): LDL 53, LDL-P 975, K 3.7, creatinine 1.2 Labs (11/14): K 3.7, creatinine 0.9, BNP 832 Labs (2/15): K 3.5, creatinine 1.10, HCT 42.6, UPEP negative, SPEP negative.  Labs (3/15): K 3.8, creatinine 0.88 Labs (4/15): K 3.7, creatinine 0.99, proBNP 1653 Labs (7/15): K 4, creatinine 0.93 Labs (12/15): LDL 77, HDL 58, K 3.9, creatinine 1.03, LFTs normal Labs (4/16): K 3.8, creatinine 0.93, BNP 475, plts 105, HCT 42.3 Labs (11/16): K 3.6, creatinine 1.1, HCT 42.2 Labs (12/16): LDL 54, HDL 50, BNP 628 Labs (6/17): K 3.7, creatinine 0.97, HCT 41.3, BNP 489 Labs (9/17): K 3.8, creatinine 0.97, LDL 65, HDL 54 Labs (9/18): K 4.1, creatinine 1.05, hgb 14.6, BNP 646  PMH: 1. Chronic diastolic CHF: Echo (8/26) with EF 55-60%, severe LVH (no SAM, no asymmetric hypertrophy, no LVOT gradient), moderate-severe LAE, moderately dilated RV with mildly decreased systolic function, moderate to severe RAE, PA systolic pressure 86 mmHg, moderate-severe TR, moderate MR, trivial pericardial effusion.  Cardiac MRI (5/14): EF 65%, severe LVH, no definite evidence for amyloidosis (no delayed enhancement, myocardium not difficult to null).  Echo (2/15) with EF 75%, severe LVH, grade II diastolic dysfunction, moderate MR, RV mildly dilated with mildly decreased systolic function, moderate TR, PA systolic pressure 84 mmHg, small pericardial effusion. Abdominal fat pad biopsy (2/15) showed no evidence for amyloidosis.  SPEP/UPEP negative 2/15.  - Echo (4/16) with EF 60-65%, severe LVH, RV mildly dilated with mildly decreased systolic function, PA systolic pressure 40 mmHg. - Echo (9/17) with EF 65-70%, severe LVH, moderate MR, normal RV size with mildly decreased systolic function, PASP 56 mmHg, small pericardial effusion.  2. Chronic atrial fibrillation since around 2004.  Developed bradycardia  and metoprolol stopped 2/15. Holter (3/15) with average HR 73, atrial fibrillation, 3.8 sec pause x 1 while asleep, PVCs.  3. HTN: For decades.  4. LHC (2/08) with no significant disease.  5. Chronic thrombocytopenia: ITP 6. Pulmonary arterial HTN: RHC (5/14) with mean RA 13, PA 104/36 (mean 63), mean PCWP 20 on right and 23 on left, CI 2.3 (Fick) and 1.6 (thermo), PVR 10.4 WU (Fick) and 15 WU (thermo).  Vasodilator testing not done as patient was already on amlodipine.  V/Q scan (5/14) with no evidence for chronic PE.  ANA, RF, and anti-SCL70 antibody negative.  PFTs (5/14) with FEV1 60%, FVC 54%, ratio 112%, TLC 61%, DLCO 43% => restrictive defect.  Sleep study (7/14) with no OSA.  CT chest with areas of mosaic attenuation in lungs (saw pulmonary, thought air trapping and not ILD). RHC (3/15) with RA mean 5, PA 66/31 mean 43, PCWP mean 11, Cardiac Index (Fick) 1.97, PVR 9.2 WU.  Patient was started on Tyvaso in 3/15.  Echo (4/16) with mildly  dilated and mildly dysfunctional RV, PA systolic pressure 40 mmHg.   SH: Married, retired from a bank, 3 children, lives in [place].   FH: No heart disease, +HTN.  ROS: All systems reviewed and negative except as per HPI.   Current Outpatient Prescriptions  Medication Sig Dispense Refill  . acetaminophen (TYLENOL) 500 MG tablet Take 500 mg by mouth every 6 (six) hours as needed for mild pain or headache.     Marland Kitchen amLODipine (NORVASC) 2.5 MG tablet TAKE ONE TABLET BY MOUTH ONCE DAILY 30 tablet 0  . KLOR-CON M20 20 MEQ tablet TAKE TWO TABLETS BY MOUTH IN THE MORNING AND ONE IN THE EVENING 90 tablet 0  . Macitentan (OPSUMIT) 10 MG TABS Take 1 tablet (10 mg total) by mouth daily with breakfast. 30 tablet 6  . rosuvastatin (CRESTOR) 20 MG tablet Take 20 mg by mouth at bedtime.     . tadalafil, PAH, (ADCIRCA) 20 MG tablet Take 2 tablets (40 mg total) by mouth daily. 180 tablet 3  . torsemide (DEMADEX) 20 MG tablet Take 1.5 tablets (30 mg total) by mouth 2 (two)  times daily. 90 tablet 6  . Treprostinil (TYVASO) 0.6 MG/ML SOLN Inhale 54 mcg into the lungs 4 (four) times daily. 30 mL 11  . warfarin (COUMADIN) 5 MG tablet TAKE AS DIRECTED BY  COUMADIN  CLINIC  (90  DAY  SUPPLY) 100 tablet 1   No current facility-administered medications for this encounter.     BP (!) 150/79   Pulse 76   Wt 173 lb 8 oz (78.7 kg)   SpO2 96%   BMI 31.73 kg/m  General: NAD Neck: No JVD, no thyromegaly or thyroid nodule.  Lungs: Clear to auscultation bilaterally with normal respiratory effort. CV: Nondisplaced PMI.  Heart regular S1/S2, no S3/S4, 2/6 SEM RUSB.  No peripheral edema.  No carotid bruit.  Normal pedal pulses.  Abdomen: Soft, nontender, no hepatosplenomegaly, no distention.  Skin: Intact without lesions or rashes.  Neurologic: Alert and oriented x 3.  Psych: Normal affect. Extremities: No clubbing or cyanosis.  HEENT: Normal.    Assessment/Plan: 1. Pulmonary HTN: Patient has severe pulmonary arterial HTN.  Last RHC in 3/15 showed CI 1.97, PVR 9.  It is possible that long-standing PCWP elevation from diastolic LV dysfunction could lead to pulmonary vascular changes with pulmonary arterial hypertension out of proportion to the PCWP elevation.  However, suspect idiopathic primary pulmonary HTN (Group 1). Collagen vascular disease workup was negative (negative RF, ANA and negative anti-SCL-70).  V/Q scan was not suggestive of chronic PEs.  PFTs were suggestive of restrictive lung disease but CT chest and evaluation by pulmonary did not suggest interstitial lung disease.  Sleep study did not show OSA.  Last echo in 9/17 showed mildly dysfunctional RV with PA systolic pressure 56 mmHg.  6 minute walk today was fairly stable compared to the past.   - Continue oxygen at night and with exertion as needed  - Continue Tyvaso, macitentan, Adcirca.  She is doing well on this regimen and is no longer having trouble getting her meds. - She is due for repeat echo, I will  arrange.  Also check BNP today.  2. Chronic diastolic CHF: EF preserved on echo with severe concentric LVH and a small pericardial effusion.  It is possible that the LVH is due to years of HTN.  The cardiac MRI was not definitively suggestive of cardiac amyloidosis.  I was still concerned for amyloidosis given the appearance of  the LV myocardium on echoes.  SPEP/UPEP negative.  Abdominal fat pad biopsy showed no evidence for amyloidosis.  - Continue torsemide 30 mg bid, looks euvolemic. BMET today.  - I will arrange for [facility] cardiac amyloid nuclear scan to assess for TTR amyloid.  3. HTN: BP controlled at home.   4. Chronic atrial fibrillation: Continue coumadin.  She is off metoprolol due to bradycardia. Holter off metoprolol did not show any high rate episodes.  CBC today.  5. Hyperlipidemia: On Crestor, check lipids today.   Followup in 4 months.  [name]  03/29/2017

## 2017-03-29 NOTE — Telephone Encounter (Signed)
Left pt message giving nuc med/pyp test 04/07/17 @ 12 , asked pt to call back to confirm appt

## 2017-03-29 NOTE — Addendum Note (Signed)
Encounter addended by: Larey Dresser, MD on: 03/29/2017 11:02 PM<BR>    Actions taken: Sign clinical note, Visit diagnoses modified, LOS modified

## 2017-03-29 NOTE — Addendum Note (Signed)
Encounter addended by: Larey Dresser, MD on: 03/29/2017 11:03 PM<BR>    Actions taken: Sign clinical note

## 2017-03-29 NOTE — Patient Instructions (Signed)
Labs drawn today (if we do not call you, then your lab work was stable)   Your physician has requested that you have an echocardiogram. Echocardiography is a painless test that uses sound waves to create images of your heart. It provides your doctor with information about the size and shape of your heart and how well your heart's chambers and valves are working. This procedure takes approximately one hour. There are no restrictions for this procedure.   You have been referred to [facility] scan they will call you   Your physician recommends that you schedule a follow-up appointment in: 4 months

## 2017-03-31 NOTE — Telephone Encounter (Signed)
User: Cherie Dark A Date/time: 03/29/17 11:16 AM  Comment: Called pt and lmsg for her to CB to get scheduled for an echo.   Context:  Outcome: Left Message  Phone number: [phone] Phone Type: Mobile  Comm. type: Telephone Call type: Outgoing  Contact: Debby Freiberg E Relation to patient: Self

## 2017-04-01 DIAGNOSIS — E559 Vitamin D deficiency, unspecified: Secondary | ICD-10-CM | POA: Diagnosis not present

## 2017-04-01 DIAGNOSIS — E7800 Pure hypercholesterolemia, unspecified: Secondary | ICD-10-CM | POA: Diagnosis not present

## 2017-04-01 DIAGNOSIS — I10 Essential (primary) hypertension: Secondary | ICD-10-CM | POA: Diagnosis not present

## 2017-04-01 DIAGNOSIS — Z0000 Encounter for general adult medical examination without abnormal findings: Secondary | ICD-10-CM | POA: Diagnosis not present

## 2017-04-02 ENCOUNTER — Encounter (HOSPITAL_COMMUNITY): Payer: Self-pay

## 2017-04-06 DIAGNOSIS — D696 Thrombocytopenia, unspecified: Secondary | ICD-10-CM | POA: Diagnosis not present

## 2017-04-06 DIAGNOSIS — I8393 Asymptomatic varicose veins of bilateral lower extremities: Secondary | ICD-10-CM | POA: Diagnosis not present

## 2017-04-06 DIAGNOSIS — R430 Anosmia: Secondary | ICD-10-CM | POA: Diagnosis not present

## 2017-04-06 DIAGNOSIS — E559 Vitamin D deficiency, unspecified: Secondary | ICD-10-CM | POA: Diagnosis not present

## 2017-04-06 DIAGNOSIS — I509 Heart failure, unspecified: Secondary | ICD-10-CM | POA: Diagnosis not present

## 2017-04-06 DIAGNOSIS — Z6831 Body mass index (BMI) 31.0-31.9, adult: Secondary | ICD-10-CM | POA: Diagnosis not present

## 2017-04-06 DIAGNOSIS — I4891 Unspecified atrial fibrillation: Secondary | ICD-10-CM | POA: Diagnosis not present

## 2017-04-06 DIAGNOSIS — I272 Pulmonary hypertension, unspecified: Secondary | ICD-10-CM | POA: Diagnosis not present

## 2017-04-06 DIAGNOSIS — E7800 Pure hypercholesterolemia, unspecified: Secondary | ICD-10-CM | POA: Diagnosis not present

## 2017-04-06 DIAGNOSIS — Z7901 Long term (current) use of anticoagulants: Secondary | ICD-10-CM | POA: Diagnosis not present

## 2017-04-06 DIAGNOSIS — I10 Essential (primary) hypertension: Secondary | ICD-10-CM | POA: Diagnosis not present

## 2017-04-06 DIAGNOSIS — D72819 Decreased white blood cell count, unspecified: Secondary | ICD-10-CM | POA: Diagnosis not present

## 2017-04-07 ENCOUNTER — Other Ambulatory Visit: Payer: Self-pay

## 2017-04-07 ENCOUNTER — Encounter (HOSPITAL_COMMUNITY): Payer: Medicare Other

## 2017-04-08 ENCOUNTER — Other Ambulatory Visit (HOSPITAL_COMMUNITY): Payer: Self-pay | Admitting: *Deleted

## 2017-04-12 ENCOUNTER — Other Ambulatory Visit: Payer: Self-pay

## 2017-04-12 ENCOUNTER — Ambulatory Visit (HOSPITAL_COMMUNITY): Payer: Medicare Other | Attending: Cardiovascular Disease

## 2017-04-12 DIAGNOSIS — I313 Pericardial effusion (noninflammatory): Secondary | ICD-10-CM | POA: Diagnosis not present

## 2017-04-12 DIAGNOSIS — I081 Rheumatic disorders of both mitral and tricuspid valves: Secondary | ICD-10-CM | POA: Insufficient documentation

## 2017-04-12 DIAGNOSIS — I5032 Chronic diastolic (congestive) heart failure: Secondary | ICD-10-CM

## 2017-04-12 DIAGNOSIS — I371 Nonrheumatic pulmonary valve insufficiency: Secondary | ICD-10-CM | POA: Diagnosis not present

## 2017-04-12 DIAGNOSIS — I4891 Unspecified atrial fibrillation: Secondary | ICD-10-CM | POA: Diagnosis not present

## 2017-04-12 DIAGNOSIS — I110 Hypertensive heart disease with heart failure: Secondary | ICD-10-CM | POA: Insufficient documentation

## 2017-04-12 DIAGNOSIS — E785 Hyperlipidemia, unspecified: Secondary | ICD-10-CM | POA: Insufficient documentation

## 2017-04-13 ENCOUNTER — Other Ambulatory Visit (HOSPITAL_COMMUNITY): Payer: Self-pay | Admitting: *Deleted

## 2017-04-13 MED ORDER — TREPROSTINIL 0.6 MG/ML IN SOLN: 54.0000 ug | Freq: Four times a day (QID) | RESPIRATORY_TRACT | 11 refills | Status: DC

## 2017-04-15 ENCOUNTER — Ambulatory Visit (INDEPENDENT_AMBULATORY_CARE_PROVIDER_SITE_OTHER): Payer: Medicare Other | Admitting: *Deleted

## 2017-04-15 DIAGNOSIS — Z5181 Encounter for therapeutic drug level monitoring: Secondary | ICD-10-CM | POA: Diagnosis not present

## 2017-04-15 DIAGNOSIS — I4891 Unspecified atrial fibrillation: Secondary | ICD-10-CM

## 2017-04-15 LAB — POCT INR: INR: 2

## 2017-04-19 ENCOUNTER — Encounter (HOSPITAL_COMMUNITY)
Admission: RE | Admit: 2017-04-19 | Discharge: 2017-04-19 | Disposition: A | Discharge status: Home / Self Care | Payer: Medicare Other | Source: Ambulatory Visit | Attending: Cardiology | Admitting: Cardiology

## 2017-04-19 ENCOUNTER — Other Ambulatory Visit (HOSPITAL_COMMUNITY): Payer: Self-pay

## 2017-04-19 DIAGNOSIS — E854 Organ-limited amyloidosis: Secondary | ICD-10-CM | POA: Diagnosis not present

## 2017-04-19 DIAGNOSIS — E859 Amyloidosis, unspecified: Secondary | ICD-10-CM | POA: Diagnosis not present

## 2017-04-19 MED ORDER — TECHNETIUM TC 99M PYROPHOSPHATE
20.0000 | Freq: Once | INTRAVENOUS | Status: DC
  Administered 2017-04-19: 20 via INTRAVENOUS

## 2017-05-06 DIAGNOSIS — Z780 Asymptomatic menopausal state: Secondary | ICD-10-CM | POA: Diagnosis not present

## 2017-05-06 DIAGNOSIS — Z01419 Encounter for gynecological examination (general) (routine) without abnormal findings: Secondary | ICD-10-CM | POA: Diagnosis not present

## 2017-05-11 ENCOUNTER — Other Ambulatory Visit: Payer: Medicare Other

## 2017-05-27 ENCOUNTER — Ambulatory Visit (INDEPENDENT_AMBULATORY_CARE_PROVIDER_SITE_OTHER): Payer: Medicare Other | Admitting: *Deleted

## 2017-05-27 DIAGNOSIS — Z5181 Encounter for therapeutic drug level monitoring: Secondary | ICD-10-CM

## 2017-05-27 DIAGNOSIS — I4891 Unspecified atrial fibrillation: Secondary | ICD-10-CM | POA: Diagnosis not present

## 2017-05-27 LAB — POCT INR: INR: 2.4

## 2017-05-27 NOTE — Patient Instructions (Signed)
Continue same dose 1 tablet every day except 1/2 tablet on Wednesdays.  Recheck in 6 weeks. Call with any medications changes [phone].

## 2017-06-26 ENCOUNTER — Other Ambulatory Visit: Payer: Self-pay | Admitting: General Surgery

## 2017-06-26 DIAGNOSIS — N6320 Unspecified lump in the left breast, unspecified quadrant: Secondary | ICD-10-CM

## 2017-07-05 ENCOUNTER — Telehealth (HOSPITAL_COMMUNITY): Payer: Self-pay | Admitting: Pharmacist

## 2017-07-05 ENCOUNTER — Other Ambulatory Visit: Payer: Self-pay | Admitting: Cardiology

## 2017-07-05 NOTE — Telephone Encounter (Signed)
Enrolled in PANF so that Ms. Oliveri will have $5300 to use toward her [facility] medication copay costs through 06/30/18.   ID: [id number] GRP: [id number]  Nathan Moctezuma K. Velva Harman, PharmD, BCPS, CPP Clinical Pharmacist Phone: [phone] 07/05/2017 2:34 PM

## 2017-07-05 NOTE — Telephone Encounter (Signed)
Opsumit PA approved by [facility] Part D through 07/05/19.   Ruta Hinds. Velva Harman, PharmD, BCPS, CPP Clinical Pharmacist Phone: [phone] 07/05/2017 4:09 PM

## 2017-07-07 ENCOUNTER — Other Ambulatory Visit: Payer: Self-pay | Admitting: *Deleted

## 2017-07-07 ENCOUNTER — Other Ambulatory Visit (HOSPITAL_COMMUNITY): Payer: Self-pay | Admitting: Pharmacist

## 2017-07-07 MED ORDER — WARFARIN SODIUM 5 MG PO TABS: ORAL_TABLET | ORAL | 1 refills | Status: DC

## 2017-07-08 ENCOUNTER — Ambulatory Visit (INDEPENDENT_AMBULATORY_CARE_PROVIDER_SITE_OTHER): Payer: Medicare Other | Admitting: Pharmacist

## 2017-07-08 DIAGNOSIS — Z5181 Encounter for therapeutic drug level monitoring: Secondary | ICD-10-CM

## 2017-07-08 DIAGNOSIS — I4891 Unspecified atrial fibrillation: Secondary | ICD-10-CM

## 2017-07-08 LAB — POCT INR: INR: 2.8

## 2017-07-08 NOTE — Patient Instructions (Signed)
Description   Continue same dose 1 tablet every day except 1/2 tablet on Wednesdays.  Recheck in 6 weeks. Call with any medications changes [phone].

## 2017-07-15 DIAGNOSIS — I10 Essential (primary) hypertension: Secondary | ICD-10-CM | POA: Diagnosis not present

## 2017-07-15 DIAGNOSIS — I509 Heart failure, unspecified: Secondary | ICD-10-CM | POA: Diagnosis not present

## 2017-07-15 DIAGNOSIS — I4891 Unspecified atrial fibrillation: Secondary | ICD-10-CM | POA: Diagnosis not present

## 2017-07-21 ENCOUNTER — Other Ambulatory Visit: Payer: Self-pay

## 2017-07-21 ENCOUNTER — Other Ambulatory Visit (HOSPITAL_COMMUNITY): Payer: Self-pay | Admitting: *Deleted

## 2017-07-22 MED ORDER — MACITENTAN 10 MG PO TABS: 1.0000 | ORAL_TABLET | Freq: Every day | ORAL | 11 refills | Status: DC

## 2017-07-26 ENCOUNTER — Telehealth (HOSPITAL_COMMUNITY): Payer: Self-pay | Admitting: Pharmacist

## 2017-07-26 ENCOUNTER — Other Ambulatory Visit (HOSPITAL_COMMUNITY): Payer: Self-pay | Admitting: *Deleted

## 2017-07-26 MED ORDER — MACITENTAN 10 MG PO TABS: 1.0000 | ORAL_TABLET | Freq: Every day | ORAL | 11 refills | Status: DC

## 2017-07-26 NOTE — Telephone Encounter (Signed)
[facility] regarding macitentan. PA is approved through 07/05/19 as noted previously, however they never received prescription. Will fax prescription today.

## 2017-08-04 ENCOUNTER — Other Ambulatory Visit (HOSPITAL_COMMUNITY): Payer: Self-pay | Admitting: Pharmacist

## 2017-08-04 MED ORDER — MACITENTAN 10 MG PO TABS: 1.0000 | ORAL_TABLET | Freq: Every day | ORAL | 11 refills | Status: DC

## 2017-08-16 ENCOUNTER — Ambulatory Visit (INDEPENDENT_AMBULATORY_CARE_PROVIDER_SITE_OTHER): Payer: Medicare Other | Admitting: Pharmacist

## 2017-08-16 DIAGNOSIS — Z5181 Encounter for therapeutic drug level monitoring: Secondary | ICD-10-CM | POA: Diagnosis not present

## 2017-08-16 DIAGNOSIS — Z961 Presence of intraocular lens: Secondary | ICD-10-CM | POA: Diagnosis not present

## 2017-08-16 DIAGNOSIS — H35051 Retinal neovascularization, unspecified, right eye: Secondary | ICD-10-CM | POA: Diagnosis not present

## 2017-08-16 DIAGNOSIS — H2512 Age-related nuclear cataract, left eye: Secondary | ICD-10-CM | POA: Diagnosis not present

## 2017-08-16 DIAGNOSIS — H35071 Retinal telangiectasis, right eye: Secondary | ICD-10-CM | POA: Diagnosis not present

## 2017-08-16 DIAGNOSIS — I4891 Unspecified atrial fibrillation: Secondary | ICD-10-CM | POA: Diagnosis not present

## 2017-08-16 DIAGNOSIS — H35351 Cystoid macular degeneration, right eye: Secondary | ICD-10-CM | POA: Diagnosis not present

## 2017-08-16 LAB — POCT INR: INR: 1.9

## 2017-08-16 NOTE — Patient Instructions (Signed)
Description   Take 1.5 tablets today, then continue same dose 1 tablet every day except 1/2 tablet on Wednesdays.  Recheck in 5 weeks. Call with any medications changes [phone].

## 2017-08-30 ENCOUNTER — Other Ambulatory Visit (HOSPITAL_COMMUNITY): Payer: Self-pay | Admitting: Cardiology

## 2017-09-08 ENCOUNTER — Ambulatory Visit (HOSPITAL_COMMUNITY)
Admission: RE | Admit: 2017-09-08 | Discharge: 2017-09-08 | Disposition: A | Discharge status: Home / Self Care | Payer: Medicare Other | Source: Ambulatory Visit | Attending: Cardiology | Admitting: Cardiology

## 2017-09-08 VITALS — BP 138/82 | HR 65 | Wt 170.1 lb

## 2017-09-08 DIAGNOSIS — D696 Thrombocytopenia, unspecified: Secondary | ICD-10-CM | POA: Insufficient documentation

## 2017-09-08 DIAGNOSIS — I272 Pulmonary hypertension, unspecified: Secondary | ICD-10-CM

## 2017-09-08 DIAGNOSIS — I313 Pericardial effusion (noninflammatory): Secondary | ICD-10-CM | POA: Insufficient documentation

## 2017-09-08 DIAGNOSIS — I2721 Secondary pulmonary arterial hypertension: Secondary | ICD-10-CM | POA: Diagnosis not present

## 2017-09-08 DIAGNOSIS — R001 Bradycardia, unspecified: Secondary | ICD-10-CM | POA: Insufficient documentation

## 2017-09-08 DIAGNOSIS — Z79899 Other long term (current) drug therapy: Secondary | ICD-10-CM | POA: Insufficient documentation

## 2017-09-08 DIAGNOSIS — E785 Hyperlipidemia, unspecified: Secondary | ICD-10-CM | POA: Diagnosis not present

## 2017-09-08 DIAGNOSIS — I482 Chronic atrial fibrillation, unspecified: Secondary | ICD-10-CM

## 2017-09-08 DIAGNOSIS — I110 Hypertensive heart disease with heart failure: Secondary | ICD-10-CM | POA: Insufficient documentation

## 2017-09-08 DIAGNOSIS — Z7901 Long term (current) use of anticoagulants: Secondary | ICD-10-CM | POA: Insufficient documentation

## 2017-09-08 DIAGNOSIS — I5032 Chronic diastolic (congestive) heart failure: Secondary | ICD-10-CM | POA: Diagnosis not present

## 2017-09-08 LAB — BASIC METABOLIC PANEL
Anion gap: 9 (ref 5–15)
BUN: 11 mg/dL (ref 6–20)
CHLORIDE: 104 mmol/L (ref 101–111)
CO2: 27 mmol/L (ref 22–32)
CREATININE: 1.07 mg/dL — AB (ref 0.44–1.00)
Calcium: 9.1 mg/dL (ref 8.9–10.3)
GFR calc non Af Amer: 49 mL/min — ABNORMAL LOW (ref >60)
GFR, EST AFRICAN AMERICAN: 57 mL/min — AB (ref >60)
GLUCOSE: 90 mg/dL (ref 65–99)
Potassium: 4.1 mmol/L (ref 3.5–5.1)
Sodium: 140 mmol/L (ref 135–145)

## 2017-09-08 LAB — BRAIN NATRIURETIC PEPTIDE: B Natriuretic Peptide: 502.5 pg/mL — ABNORMAL HIGH (ref 0.0–100.0)

## 2017-09-08 LAB — CBC
HCT: 42.1 % (ref 36.0–46.0)
Hemoglobin: 13.2 g/dL (ref 12.0–15.0)
MCH: 30.5 pg (ref 26.0–34.0)
MCHC: 31.4 g/dL (ref 30.0–36.0)
MCV: 97.2 fL (ref 78.0–100.0)
PLATELETS: 111 10*3/uL — AB (ref 150–400)
RBC: 4.33 MIL/uL (ref 3.87–5.11)
RDW: 14.4 % (ref 11.5–15.5)
WBC: 4.2 10*3/uL (ref 4.0–10.5)

## 2017-09-08 NOTE — Progress Notes (Signed)
6 Minute walk performed.  Patient at rest was 95 % on 4 L pulse and HR 66.  Patient was able to ambulate with no issues 1250 feet in 6 mins, oxygen remained at 95-96% on 4 L pulse and HR 99-104.

## 2017-09-08 NOTE — Patient Instructions (Signed)
Labs drawn today (if we do not call you, then your lab work was stable)   We did a 6 minute walk test  Your physician recommends that you schedule a follow-up appointment in: 4 months (July, 2019) with Dr. Aundra Dubin Please Call an Schedule Appointment

## 2017-09-09 NOTE — Progress Notes (Signed)
Patient ID: Vicki [name], female   DOB: May 30, 1941, 77 y.o.   MRN: [id number] PCP: Dr. Shelia Media Cardiology: Dr. Aundra Dubin  Vicki [name] is a 77 y.o. with history of chronic diastolic CHF and chronic atrial fibrillation presents for followup of pulmonary hypertension and CHF.  Patient was followed by Dr. Glade Lloyd in the past for chronic atrial fibrillation.  She has been on coumadin.  She reports progressive exertional dyspnea since 2011.  This gradually worsened and became quite significant over the last few months.  She used to have significant HTN, but more recently her BP has been on the lower side.  Echo was done in 2/14, showing severe concentric LVH with EF 55-60%, moderately dilated RV, moderate to severe TR, and PA systolic pressure 86 mmHg.  I did a right heart cath in 5/14.  This showed PA pressure 104/36 with PCWP 20, suggesting pulmonary arterial HTN well out of proportion to the mildly elevate wedge pressure.  She was already on amlodipine so I did not do vasodilator testing.  V/Q scan was done, showing no evidence for chronic PEs.  PFTs showed a restrictive defect. Cardiac MRI did not show definite evidence for amyloid.  I started her on macitentan 10 mg daily.  Initially, she felt better on macitentan.  However, she was admitted in 5/14 from her sleep study due to orthopnea and dyspnea.  She was diuresed for several days and diuretic was switched over to torsemide.  I next started her on tadalafil 20 mg daily and titrated up to 40 mg daily.  She thinks that this helped.  She saw pulmonary after chest CT (showed mosaic attenuation in lungs).  This was thought to be due to air-trapping rather than ILD.  She was started on Spiriva. She wears oxygen at home.   She had an echocardiogram in 2/15 that showed severe LVH, EF 75%, small pericardial effusion, mildly dilated RV with mildly decreased systolic function, PA systolic pressure 84 mmHg.  She is not totally sure if she was taking macitentan and tadalafil at  that time.  She has had a hard month.  She ran out of her macitentan and tadalafil and her insurance company refused to refill them.  After this, she took a decided turn for the worse.  [place] her foot in the fall.  She became much more short of breath, just with walking around the house. She developed lightheaded spells, especially with micturation.  Given lightheadedness and presyncope, she was admitted last week.  She was restarted on her medications and she finally got back on her meds at home.  In the hospital, she was noted to be bradycardic with HR to 30s so metoprolol was stopped.  Of note, her abdominal fat pad biopsy did not suggest amyloidosis. Repeat RHC in 3/15 still with moderate to severe PAH and low CI.  Holter off beta blocker in 3/15 showed average HR 73.   Echo in 9/17 showed EF 65-70% with moderate to severe LVH, moderate MR, normal RV size with mildly decreased systolic function, PASP 56 mmHg, small pericardial effusion.   Echo in 10/18 showed EF 60-65% with moderate LVH, moderate MR, severe biatrial enlargement, PASP 62 mmHg, RV normal.  PYP scan in 10/18 was not suggestive of TTR amyloidosis.   She presents today for HF/PAH follow up. On Tyvaso, tadalafil, and macitentan for pulmonary hypertension, able to get all meds without problems. Weight is down 3 lbs.  She has bendopnea but can walk on flat ground and up a flight of stairs without significant dyspnea.  No lightheadedness or palpitations.  No chest pain.    6 minute walk (5/14): 122 m.   6 minute walk (7/14): 152 m 6 minute walk (10/14): 183 m 6 minute walk (4/15): 317 m 6 minute walk (12/15): 259 m 6 minute walk (4/16): 293 m 6 minute walk (8/16): 341 m 6 minute walk (12/16): 274 m 6 minute walk (6/17): 314 m 6 minute walk (9/17): 307 m 6 minute walk (4/18): 366 m 6 minute walk (10/18): 320 m 6 minute walk (3/19): 381 m  Labs (12/13): HCT 45.1,  plts 153, K 3.5, creatinine 1.0 Labs (1/14): BNP 849 Labs (4/14): K 3.1, creatinine 1.0, ANA and anti-SCL-70 antibody negative.  Rheumatoid factor negative.  Serum immunofixation did not show monoclonal light chains.  Labs (5/14): K 3.3, creatinine 0.79, proBNP 5147 Labs (6/14): K 4, creatinine 0.9, BNP 1001 Labs (10/14): LDL 53, LDL-P 975, K 3.7, creatinine 1.2 Labs (11/14): K 3.7, creatinine 0.9, BNP 832 Labs (2/15): K 3.5, creatinine 1.10, HCT 42.6, UPEP negative, SPEP negative.  Labs (3/15): K 3.8, creatinine 0.88 Labs (4/15): K 3.7, creatinine 0.99, proBNP 1653 Labs (7/15): K 4, creatinine 0.93 Labs (12/15): LDL 77, HDL 58, K 3.9, creatinine 1.03, LFTs normal Labs (4/16): K 3.8, creatinine 0.93, BNP 475, plts 105, HCT 42.3 Labs (11/16): K 3.6, creatinine 1.1, HCT 42.2 Labs (12/16): LDL 54, HDL 50, BNP 628 Labs (6/17): K 3.7, creatinine 0.97, HCT 41.3, BNP 489 Labs (9/17): K 3.8, creatinine 0.97, LDL 65, HDL 54 Labs (9/18): K 4.1, creatinine 1.05, hgb 14.6, BNP 646 Labs (10/18): BNP 588, TSH normal, LDL 73, HDL 48, K 3.5, creatinine 0.97, hgb 12.5, plts 91, LFTs normal  PMH: 1. Chronic diastolic CHF: Echo (5/99) with EF 55-60%, severe LVH (no SAM, no asymmetric hypertrophy, no LVOT gradient), moderate-severe LAE, moderately dilated RV with mildly decreased systolic function, moderate to severe RAE, PA systolic pressure 86 mmHg, moderate-severe TR, moderate MR, trivial pericardial effusion.  Cardiac MRI (5/14): EF 65%, severe LVH, no definite evidence for amyloidosis (no delayed enhancement, myocardium not difficult to null).  Echo (2/15) with EF 75%, severe LVH, grade II diastolic dysfunction, moderate MR, RV mildly dilated with mildly decreased systolic function, moderate TR, PA systolic pressure 84 mmHg, small pericardial effusion. Abdominal fat pad biopsy (2/15) showed no evidence for amyloidosis.  SPEP/UPEP negative 2/15.  - Echo (4/16) with EF 60-65%, severe LVH, RV mildly dilated with  mildly decreased systolic function, PA systolic pressure 40 mmHg. - Echo (9/17) with EF 65-70%, severe LVH, moderate MR, normal RV size with mildly decreased systolic function, PASP 56 mmHg, small pericardial effusion.  - Echo (10/18): EF 60-65% with moderate LVH, moderate MR, severe biatrial enlargement, PASP 62 mmHg, RV normal. - PYP scan (10/18) was not suggestive of TTR amyloidosis.  2. Chronic atrial fibrillation since around 2004.  Developed bradycardia and metoprolol stopped 2/15. Holter (3/15) with average HR 73, atrial fibrillation, 3.8 sec pause x 1 while asleep, PVCs.  3. HTN: For decades.  4. LHC (2/08) with no significant disease.  5. Chronic thrombocytopenia: ITP 6. Pulmonary arterial HTN: RHC (5/14) with mean RA 13, PA 104/36 (mean 63), mean PCWP 20 on right and 23 on left, CI 2.3 (Fick) and 1.6 (thermo), PVR 10.4 WU (Fick) and 15 WU (thermo).  Vasodilator testing not done as patient was already on amlodipine.  V/Q scan (5/14) with  no evidence for chronic PE.  ANA, RF, and anti-SCL70 antibody negative.  PFTs (5/14) with FEV1 60%, FVC 54%, ratio 112%, TLC 61%, DLCO 43% => restrictive defect.  Sleep study (7/14) with no OSA.  CT chest with areas of mosaic attenuation in lungs (saw pulmonary, thought air trapping and not ILD). RHC (3/15) with RA mean 5, PA 66/31 mean 43, PCWP mean 11, Cardiac Index (Fick) 1.97, PVR 9.2 WU.  Patient was started on Tyvaso in 3/15.  Echo (4/16) with mildly dilated and mildly dysfunctional RV, PA systolic pressure 40 mmHg.   SH: Married, retired from a bank, 3 children, lives in [place].   FH: No heart disease, +HTN.  ROS: All systems reviewed and negative except as per HPI.   Current Outpatient Medications  Medication Sig Dispense Refill  . acetaminophen (TYLENOL) 500 MG tablet Take 500 mg by mouth every 6 (six) hours as needed for mild pain or headache.     Marland Kitchen amLODipine (NORVASC) 2.5 MG tablet TAKE ONE TABLET BY MOUTH ONCE DAILY 30 tablet 0  .  Cholecalciferol (VITAMIN D3) 2000 units TABS Take 1 tablet by mouth daily.     Marland Kitchen KLOR-CON M20 20 MEQ tablet TAKE TWO TABLETS BY MOUTH IN THE MORNING AND ONE IN THE EVENING 90 tablet 0  . Macitentan (OPSUMIT) 10 MG TABS Take 1 tablet (10 mg total) by mouth daily with breakfast. 30 tablet 11  . rosuvastatin (CRESTOR) 20 MG tablet Take 20 mg by mouth at bedtime.     . tadalafil, PAH, (ADCIRCA) 20 MG tablet Take 2 tablets (40 mg total) by mouth daily. 180 tablet 3  . torsemide (DEMADEX) 20 MG tablet Take 1.5 tablets (30 mg total) by mouth 2 (two) times daily. 90 tablet 6  . Treprostinil (TYVASO) 0.6 MG/ML SOLN Inhale 54 mcg into the lungs 4 (four) times daily. 30 mL 11  . warfarin (COUMADIN) 5 MG tablet USE AS DIRECTED BY  COUMADIN  CLINIC 30 tablet 3   No current facility-administered medications for this encounter.     BP 138/82   Pulse 65   Wt 170 lb 1.9 oz (77.2 kg)   SpO2 92%   BMI 31.12 kg/m  General: NAD Neck: No JVD, no thyromegaly or thyroid nodule.  Lungs: Clear to auscultation bilaterally with normal respiratory effort. CV: Nondisplaced PMI.  Heart irregular S1/S2, no S3/S4, 2/6 HSM apex.  No peripheral edema.  No carotid bruit.  Normal pedal pulses.  Abdomen: Soft, nontender, no hepatosplenomegaly, no distention.  Skin: Intact without lesions or rashes.  Neurologic: Alert and oriented x 3.  Psych: Normal affect. Extremities: No clubbing or cyanosis.  HEENT: Normal.   Assessment/Plan: 1. Pulmonary HTN: Patient has severe pulmonary arterial HTN.  Last RHC in 3/15 showed CI 1.97, PVR 9.  It is possible that long-standing PCWP elevation from diastolic LV dysfunction could lead to pulmonary vascular changes with pulmonary arterial hypertension out of proportion to the PCWP elevation.  However, suspect idiopathic primary pulmonary HTN (Group 1). Collagen vascular disease workup was negative (negative RF, ANA and negative anti-SCL-70).  V/Q scan was not suggestive of chronic PEs.  PFTs  were suggestive of restrictive lung disease but CT chest and evaluation by pulmonary did not suggest interstitial lung disease.  Sleep study did not show OSA.  Last echo in 10/18 showed normal RV with PA systolic pressure 62 mmHg.  6 minute walk today was the best she's ever done.   - Continue oxygen at night and with  exertion as needed  - Continue Tyvaso, macitentan, Adcirca.  She is doing well on this regimen and is no longer having trouble getting her meds. - Check BNP today.  2. Chronic diastolic CHF: EF preserved on echo with severe concentric LVH and a small pericardial effusion.  It is possible that the LVH is due to years of HTN.  The cardiac MRI was not definitively suggestive of cardiac amyloidosis.  I was still concerned for amyloidosis given the appearance of the LV myocardium on echoes.  SPEP/UPEP negative.  Abdominal fat pad biopsy showed no evidence for amyloidosis. PYP scan in 10/18 was not suggestive of TTR amyloidosis.  - Continue torsemide 30 mg bid, looks euvolemic. BMET today.  3. HTN: BP controlled at home.   4. Chronic atrial fibrillation: Continue coumadin.  She is off metoprolol due to bradycardia. Holter off metoprolol did not show any high rate episodes.  CBC today.  5. Hyperlipidemia: On Crestor, good lipids in 10/18.   Followup in 4 months.  [name]  09/09/2017

## 2017-09-15 DIAGNOSIS — H40012 Open angle with borderline findings, low risk, left eye: Secondary | ICD-10-CM | POA: Diagnosis not present

## 2017-09-15 DIAGNOSIS — H35371 Puckering of macula, right eye: Secondary | ICD-10-CM | POA: Diagnosis not present

## 2017-09-15 DIAGNOSIS — H25812 Combined forms of age-related cataract, left eye: Secondary | ICD-10-CM | POA: Diagnosis not present

## 2017-09-15 DIAGNOSIS — H35351 Cystoid macular degeneration, right eye: Secondary | ICD-10-CM | POA: Diagnosis not present

## 2017-09-15 DIAGNOSIS — H401112 Primary open-angle glaucoma, right eye, moderate stage: Secondary | ICD-10-CM | POA: Diagnosis not present

## 2017-09-15 DIAGNOSIS — Z961 Presence of intraocular lens: Secondary | ICD-10-CM | POA: Diagnosis not present

## 2017-09-20 ENCOUNTER — Ambulatory Visit (INDEPENDENT_AMBULATORY_CARE_PROVIDER_SITE_OTHER): Payer: Medicare Other | Admitting: Pharmacist

## 2017-09-20 DIAGNOSIS — I4891 Unspecified atrial fibrillation: Secondary | ICD-10-CM | POA: Diagnosis not present

## 2017-09-20 DIAGNOSIS — Z5181 Encounter for therapeutic drug level monitoring: Secondary | ICD-10-CM | POA: Diagnosis not present

## 2017-09-20 LAB — POCT INR: INR: 2.3

## 2017-09-20 NOTE — Patient Instructions (Signed)
Description   Continue same dose 1 tablet every day except 1/2 tablet on Wednesdays.  Recheck in 6 weeks. Call with any medications changes [phone].

## 2017-09-27 DIAGNOSIS — D696 Thrombocytopenia, unspecified: Secondary | ICD-10-CM | POA: Diagnosis not present

## 2017-09-27 DIAGNOSIS — I10 Essential (primary) hypertension: Secondary | ICD-10-CM | POA: Diagnosis not present

## 2017-10-04 DIAGNOSIS — I10 Essential (primary) hypertension: Secondary | ICD-10-CM | POA: Diagnosis not present

## 2017-10-04 DIAGNOSIS — I509 Heart failure, unspecified: Secondary | ICD-10-CM | POA: Diagnosis not present

## 2017-11-01 ENCOUNTER — Ambulatory Visit (INDEPENDENT_AMBULATORY_CARE_PROVIDER_SITE_OTHER): Payer: Medicare Other | Admitting: *Deleted

## 2017-11-01 DIAGNOSIS — I4891 Unspecified atrial fibrillation: Secondary | ICD-10-CM

## 2017-11-01 DIAGNOSIS — Z5181 Encounter for therapeutic drug level monitoring: Secondary | ICD-10-CM

## 2017-11-01 LAB — POCT INR: INR: 2.2

## 2017-11-01 NOTE — Patient Instructions (Signed)
Description   Continue same dose 1 tablet every day except 1/2 tablet on Wednesdays.  Recheck in 6 weeks. Call with any medications changes [phone].

## 2017-11-03 ENCOUNTER — Encounter (HOSPITAL_COMMUNITY): Payer: Self-pay

## 2017-12-13 ENCOUNTER — Ambulatory Visit (INDEPENDENT_AMBULATORY_CARE_PROVIDER_SITE_OTHER): Payer: Medicare Other | Admitting: *Deleted

## 2017-12-13 DIAGNOSIS — Z5181 Encounter for therapeutic drug level monitoring: Secondary | ICD-10-CM | POA: Diagnosis not present

## 2017-12-13 DIAGNOSIS — I4891 Unspecified atrial fibrillation: Secondary | ICD-10-CM | POA: Diagnosis not present

## 2017-12-13 LAB — POCT INR: INR: 2.5 (ref 2.0–3.0)

## 2017-12-13 NOTE — Patient Instructions (Signed)
Description   Continue same dose 1 tablet every day except 1/2 tablet on Wednesdays.  Recheck in 6 weeks. Call with any medications changes [phone].

## 2017-12-29 ENCOUNTER — Other Ambulatory Visit (HOSPITAL_COMMUNITY): Payer: Self-pay

## 2017-12-29 DIAGNOSIS — I272 Pulmonary hypertension, unspecified: Secondary | ICD-10-CM

## 2017-12-29 MED ORDER — TADALAFIL (PAH) 20 MG PO TABS: 40.0000 mg | ORAL_TABLET | Freq: Every day | ORAL | 2 refills | Status: DC

## 2018-01-09 ENCOUNTER — Other Ambulatory Visit (HOSPITAL_COMMUNITY): Payer: Self-pay | Admitting: Cardiology

## 2018-01-18 ENCOUNTER — Telehealth (HOSPITAL_COMMUNITY): Payer: Self-pay | Admitting: *Deleted

## 2018-01-18 NOTE — Telephone Encounter (Signed)
Completed [facility] for pt's Tadalafil, med approved through 03/10/19, [facility] is aware

## 2018-01-25 ENCOUNTER — Ambulatory Visit (INDEPENDENT_AMBULATORY_CARE_PROVIDER_SITE_OTHER): Payer: Medicare Other | Admitting: Pharmacist

## 2018-01-25 DIAGNOSIS — Z5181 Encounter for therapeutic drug level monitoring: Secondary | ICD-10-CM | POA: Diagnosis not present

## 2018-01-25 DIAGNOSIS — I4891 Unspecified atrial fibrillation: Secondary | ICD-10-CM | POA: Diagnosis not present

## 2018-01-25 LAB — POCT INR: INR: 2.5 (ref 2.0–3.0)

## 2018-01-25 NOTE — Patient Instructions (Signed)
Description   Continue same dose 1 tablet every day except 1/2 tablet on Wednesdays.  Recheck in 6 weeks. Call with any medications changes [phone].

## 2018-02-11 ENCOUNTER — Other Ambulatory Visit (HOSPITAL_COMMUNITY): Payer: Self-pay

## 2018-02-11 MED ORDER — TREPROSTINIL 0.6 MG/ML IN SOLN: 54.0000 ug | Freq: Four times a day (QID) | RESPIRATORY_TRACT | 5 refills | Status: DC

## 2018-02-16 DIAGNOSIS — H35351 Cystoid macular degeneration, right eye: Secondary | ICD-10-CM | POA: Diagnosis not present

## 2018-02-16 DIAGNOSIS — Z961 Presence of intraocular lens: Secondary | ICD-10-CM | POA: Diagnosis not present

## 2018-02-16 DIAGNOSIS — H35071 Retinal telangiectasis, right eye: Secondary | ICD-10-CM | POA: Diagnosis not present

## 2018-02-16 DIAGNOSIS — H35051 Retinal neovascularization, unspecified, right eye: Secondary | ICD-10-CM | POA: Diagnosis not present

## 2018-02-16 DIAGNOSIS — H2512 Age-related nuclear cataract, left eye: Secondary | ICD-10-CM | POA: Diagnosis not present

## 2018-02-22 ENCOUNTER — Encounter (HOSPITAL_COMMUNITY): Payer: Medicare Other | Admitting: Cardiology

## 2018-03-10 ENCOUNTER — Ambulatory Visit (HOSPITAL_COMMUNITY)
Admission: RE | Admit: 2018-03-10 | Discharge: 2018-03-10 | Disposition: A | Discharge status: Home / Self Care | Payer: Medicare Other | Source: Ambulatory Visit | Attending: Cardiology | Admitting: Cardiology

## 2018-03-10 ENCOUNTER — Ambulatory Visit (INDEPENDENT_AMBULATORY_CARE_PROVIDER_SITE_OTHER): Payer: Medicare Other | Admitting: Pharmacist

## 2018-03-10 VITALS — BP 118/70 | HR 80 | Wt 168.2 lb

## 2018-03-10 DIAGNOSIS — I272 Pulmonary hypertension, unspecified: Secondary | ICD-10-CM

## 2018-03-10 DIAGNOSIS — E785 Hyperlipidemia, unspecified: Secondary | ICD-10-CM | POA: Insufficient documentation

## 2018-03-10 DIAGNOSIS — I2721 Secondary pulmonary arterial hypertension: Secondary | ICD-10-CM | POA: Diagnosis not present

## 2018-03-10 DIAGNOSIS — I110 Hypertensive heart disease with heart failure: Secondary | ICD-10-CM | POA: Insufficient documentation

## 2018-03-10 DIAGNOSIS — I482 Chronic atrial fibrillation, unspecified: Secondary | ICD-10-CM

## 2018-03-10 DIAGNOSIS — Z79899 Other long term (current) drug therapy: Secondary | ICD-10-CM | POA: Diagnosis not present

## 2018-03-10 DIAGNOSIS — I5032 Chronic diastolic (congestive) heart failure: Secondary | ICD-10-CM | POA: Diagnosis not present

## 2018-03-10 DIAGNOSIS — Z5181 Encounter for therapeutic drug level monitoring: Secondary | ICD-10-CM

## 2018-03-10 DIAGNOSIS — I4891 Unspecified atrial fibrillation: Secondary | ICD-10-CM

## 2018-03-10 DIAGNOSIS — Z8249 Family history of ischemic heart disease and other diseases of the circulatory system: Secondary | ICD-10-CM | POA: Diagnosis not present

## 2018-03-10 DIAGNOSIS — Z7901 Long term (current) use of anticoagulants: Secondary | ICD-10-CM | POA: Diagnosis not present

## 2018-03-10 LAB — CBC
HEMATOCRIT: 41.3 % (ref 36.0–46.0)
HEMOGLOBIN: 12.6 g/dL (ref 12.0–15.0)
MCH: 30.2 pg (ref 26.0–34.0)
MCHC: 30.5 g/dL (ref 30.0–36.0)
MCV: 99 fL (ref 78.0–100.0)
Platelets: 103 10*3/uL — ABNORMAL LOW (ref 150–400)
RBC: 4.17 MIL/uL (ref 3.87–5.11)
RDW: 13.6 % (ref 11.5–15.5)
WBC: 4.3 10*3/uL (ref 4.0–10.5)

## 2018-03-10 LAB — BASIC METABOLIC PANEL
ANION GAP: 8 (ref 5–15)
BUN: 15 mg/dL (ref 8–23)
CHLORIDE: 106 mmol/L (ref 98–111)
CO2: 28 mmol/L (ref 22–32)
Calcium: 9.3 mg/dL (ref 8.9–10.3)
Creatinine, Ser: 1.1 mg/dL — ABNORMAL HIGH (ref 0.44–1.00)
GFR calc Af Amer: 55 mL/min — ABNORMAL LOW (ref >60)
GFR calc non Af Amer: 48 mL/min — ABNORMAL LOW (ref >60)
GLUCOSE: 107 mg/dL — AB (ref 70–99)
Potassium: 3.8 mmol/L (ref 3.5–5.1)
Sodium: 142 mmol/L (ref 135–145)

## 2018-03-10 LAB — POCT INR: INR: 2.2 (ref 2.0–3.0)

## 2018-03-10 NOTE — Patient Instructions (Signed)
Labs today  Your physician has requested that you have an echocardiogram. Echocardiography is a painless test that uses sound waves to create images of your heart. It provides your doctor with information about the size and shape of your heart and how well your heart's chambers and valves are working. This procedure takes approximately one hour. There are no restrictions for this procedure.  THIS WILL BE DONE AT [facility], they will call you to schedule  We will contact you in 4 months to schedule your next appointment.

## 2018-03-10 NOTE — Patient Instructions (Signed)
Description   Continue same dose 1 tablet every day except 1/2 tablet on Wednesdays.  Recheck in 6 weeks. Call with any medications changes [phone].

## 2018-03-10 NOTE — Progress Notes (Signed)
Patient performed 6 minute walk test. Patient ambulated 1200 feet. Patient's SpO2 ranging from 92-96% on 4LNC, HR ranging 78-129. Patient denied having any chest pain during the test. Patient only slightly SOB at the end of test. Patient's SpO2 and HR returned to baseline at rest. Patient currently asymptomatic.

## 2018-03-11 NOTE — Progress Notes (Signed)
Patient ID: Vicki [name], female   DOB: 1940-08-24, 77 y.o.   MRN: [id number] PCP: Dr. Shelia Media Cardiology: Dr. Aundra Dubin  Vicki [name] is a 77 y.o. with history of chronic diastolic CHF and chronic atrial fibrillation presents for followup of pulmonary hypertension and CHF.  Patient was followed by Dr. Glade Lloyd in the past for chronic atrial fibrillation.  She has been on coumadin.  She reports progressive exertional dyspnea since 2011.  This gradually worsened and became quite significant over the last few months.  She used to have significant HTN, but more recently her BP has been on the lower side.  Echo was done in 2/14, showing severe concentric LVH with EF 55-60%, moderately dilated RV, moderate to severe TR, and PA systolic pressure 86 mmHg.  I did a right heart cath in 5/14.  This showed PA pressure 104/36 with PCWP 20, suggesting pulmonary arterial HTN well out of proportion to the mildly elevate wedge pressure.  She was already on amlodipine so I did not do vasodilator testing.  V/Q scan was done, showing no evidence for chronic PEs.  PFTs showed a restrictive defect. Cardiac MRI did not show definite evidence for amyloid.  I started her on macitentan 10 mg daily.  Initially, she felt better on macitentan.  However, she was admitted in 5/14 from her sleep study due to orthopnea and dyspnea.  She was diuresed for several days and diuretic was switched over to torsemide.  I next started her on tadalafil 20 mg daily and titrated up to 40 mg daily.  She thinks that this helped.  She saw pulmonary after chest CT (showed mosaic attenuation in lungs).  This was thought to be due to air-trapping rather than ILD.  She was started on Spiriva. She wears oxygen at home.   She had an echocardiogram in 2/15 that showed severe LVH, EF 75%, small pericardial effusion, mildly dilated RV with mildly decreased systolic function, PA systolic pressure 84 mmHg.  She is not totally sure if she was taking macitentan and tadalafil at  that time.  She has had a hard month.  She ran out of her macitentan and tadalafil and her insurance company refused to refill them.  After this, she took a decided turn for the worse.  [place] in the fall.  She became much more short of breath, just with walking around the house. She developed lightheaded spells, especially with micturation.  Given lightheadedness and presyncope, she was admitted last week.  She was restarted on her medications and she finally got back on her meds at home.  In the hospital, she was noted to be bradycardic with HR to 30s so metoprolol was stopped.  Of note, her abdominal fat pad biopsy did not suggest amyloidosis. Repeat RHC in 3/15 still with moderate to severe PAH and low CI.  Holter off beta blocker in 3/15 showed average HR 73.   Echo in 9/17 showed EF 65-70% with moderate to severe LVH, moderate MR, normal RV size with mildly decreased systolic function, PASP 56 mmHg, small pericardial effusion.   Echo in 10/18 showed EF 60-65% with moderate LVH, moderate MR, severe biatrial enlargement, PASP 62 mmHg, RV normal.  PYP scan in 10/18 was not suggestive of TTR amyloidosis.   She presents today for HF/PAH follow up. On Tyvaso, tadalafil, and macitentan for pulmonary hypertension, able to get all meds without problems. Weight is down 2 lbs.  She is short of breath walking long distances.  No lightheadedness, no chest pain.  No orthopnea/PND.  BP controlled.  Occasional palpitations, tend to be mild.  She uses oxygen at night.  She checks oxygen saturation during the day and it is ok. She had a recent trip to the beach, did fine.   6 minute walk (5/14): 122 m.   6 minute walk (7/14): 152 m 6 minute walk (10/14): 183 m 6 minute walk (4/15): 317 m 6 minute walk (12/15): 259 m 6 minute walk (4/16): 293 m 6 minute walk (8/16): 341 m 6 minute walk (12/16): 274 m 6 minute walk (6/17): 314 m 6 minute  walk (9/17): 307 m 6 minute walk (4/18): 366 m 6 minute walk (10/18): 320 m 6 minute walk (3/19): 381 m 6 minute walk (9/19): 366 m  Labs (12/13): HCT 45.1, plts 153, K 3.5, creatinine 1.0 Labs (1/14): BNP 849 Labs (4/14): K 3.1, creatinine 1.0, ANA and anti-SCL-70 antibody negative.  Rheumatoid factor negative.  Serum immunofixation did not show monoclonal light chains.  Labs (5/14): K 3.3, creatinine 0.79, proBNP 5147 Labs (6/14): K 4, creatinine 0.9, BNP 1001 Labs (10/14): LDL 53, LDL-P 975, K 3.7, creatinine 1.2 Labs (11/14): K 3.7, creatinine 0.9, BNP 832 Labs (2/15): K 3.5, creatinine 1.10, HCT 42.6, UPEP negative, SPEP negative.  Labs (3/15): K 3.8, creatinine 0.88 Labs (4/15): K 3.7, creatinine 0.99, proBNP 1653 Labs (7/15): K 4, creatinine 0.93 Labs (12/15): LDL 77, HDL 58, K 3.9, creatinine 1.03, LFTs normal Labs (4/16): K 3.8, creatinine 0.93, BNP 475, plts 105, HCT 42.3 Labs (11/16): K 3.6, creatinine 1.1, HCT 42.2 Labs (12/16): LDL 54, HDL 50, BNP 628 Labs (6/17): K 3.7, creatinine 0.97, HCT 41.3, BNP 489 Labs (9/17): K 3.8, creatinine 0.97, LDL 65, HDL 54 Labs (9/18): K 4.1, creatinine 1.05, hgb 14.6, BNP 646 Labs (10/18): BNP 588, TSH normal, LDL 73, HDL 48, K 3.5, creatinine 0.97, hgb 12.5, plts 91, LFTs normal Labs (3/19): K 4.1, creatinine 1.07, hgb 13.2  PMH: 1. Chronic diastolic CHF: Echo (7/84) with EF 55-60%, severe LVH (no SAM, no asymmetric hypertrophy, no LVOT gradient), moderate-severe LAE, moderately dilated RV with mildly decreased systolic function, moderate to severe RAE, PA systolic pressure 86 mmHg, moderate-severe TR, moderate MR, trivial pericardial effusion.  Cardiac MRI (5/14): EF 65%, severe LVH, no definite evidence for amyloidosis (no delayed enhancement, myocardium not difficult to null).  Echo (2/15) with EF 75%, severe LVH, grade II diastolic dysfunction, moderate MR, RV mildly dilated with mildly decreased systolic function, moderate TR, PA  systolic pressure 84 mmHg, small pericardial effusion. Abdominal fat pad biopsy (2/15) showed no evidence for amyloidosis.  SPEP/UPEP negative 2/15.  - Echo (4/16) with EF 60-65%, severe LVH, RV mildly dilated with mildly decreased systolic function, PA systolic pressure 40 mmHg. - Echo (9/17) with EF 65-70%, severe LVH, moderate MR, normal RV size with mildly decreased systolic function, PASP 56 mmHg, small pericardial effusion.  - Echo (10/18): EF 60-65% with moderate LVH, moderate MR, severe biatrial enlargement, PASP 62 mmHg, RV normal. - PYP scan (10/18) was not suggestive of TTR amyloidosis.  2. Chronic atrial fibrillation since around 2004.  Developed bradycardia and metoprolol stopped 2/15. Holter (3/15) with average HR 73, atrial fibrillation, 3.8 sec pause x 1 while asleep, PVCs.  3. HTN: For decades.  4. LHC (2/08) with no significant disease.  5. Chronic thrombocytopenia: ITP 6. Pulmonary arterial HTN: RHC (5/14) with mean RA 13, PA 104/36 (  mean 63), mean PCWP 20 on right and 23 on left, CI 2.3 (Fick) and 1.6 (thermo), PVR 10.4 WU (Fick) and 15 WU (thermo).  Vasodilator testing not done as patient was already on amlodipine.  V/Q scan (5/14) with no evidence for chronic PE.  ANA, RF, and anti-SCL70 antibody negative.  PFTs (5/14) with FEV1 60%, FVC 54%, ratio 112%, TLC 61%, DLCO 43% => restrictive defect.  Sleep study (7/14) with no OSA.  CT chest with areas of mosaic attenuation in lungs (saw pulmonary, thought air trapping and not ILD). RHC (3/15) with RA mean 5, PA 66/31 mean 43, PCWP mean 11, Cardiac Index (Fick) 1.97, PVR 9.2 WU.  Patient was started on Tyvaso in 3/15.  Echo (4/16) with mildly dilated and mildly dysfunctional RV, PA systolic pressure 40 mmHg.   SH: Married, retired from a bank, 3 children, lives in [place].   FH: No heart disease, +HTN.  ROS: All systems reviewed and negative except as per HPI.   Current Outpatient Medications  Medication Sig Dispense Refill  .  acetaminophen (TYLENOL) 500 MG tablet Take 500 mg by mouth every 6 (six) hours as needed for mild pain or headache.     Marland Kitchen amLODipine (NORVASC) 2.5 MG tablet TAKE ONE TABLET BY MOUTH ONCE DAILY 30 tablet 0  . Cholecalciferol (VITAMIN D3) 2000 units TABS Take 1 tablet by mouth daily.     Marland Kitchen KLOR-CON M20 20 MEQ tablet TAKE TWO TABLETS BY MOUTH IN THE MORNING AND ONE IN THE EVENING 90 tablet 0  . Macitentan (OPSUMIT) 10 MG TABS Take 1 tablet (10 mg total) by mouth daily with breakfast. 30 tablet 11  . rosuvastatin (CRESTOR) 20 MG tablet Take 20 mg by mouth at bedtime.     . tadalafil, PAH, (ADCIRCA) 20 MG tablet Take 2 tablets (40 mg total) by mouth daily. 180 tablet 2  . torsemide (DEMADEX) 20 MG tablet Take 1.5 tablets (30 mg total) by mouth 2 (two) times daily. 90 tablet 6  . Treprostinil (TYVASO) 0.6 MG/ML SOLN Inhale 54 mcg into the lungs 4 (four) times daily. 30 mL 5  . warfarin (COUMADIN) 5 MG tablet USE AS DIRECTED BY  THE  COUMADIN  CLINIC 30 tablet 3   No current facility-administered medications for this encounter.     BP 118/70   Pulse 80   Wt 76.3 kg (168 lb 3.2 oz)   SpO2 91%   BMI 30.76 kg/m  General: NAD Neck: No JVD, no thyromegaly or thyroid nodule.  Lungs: Clear to auscultation bilaterally with normal respiratory effort. CV: Nondisplaced PMI.  Heart irregular S1/S2, no S3/S4, 2/6 SEM RUSB.  No peripheral edema.  No carotid bruit.  Normal pedal pulses.  Abdomen: Soft, nontender, no hepatosplenomegaly, no distention.  Skin: Intact without lesions or rashes.  Neurologic: Alert and oriented x 3.  Psych: Normal affect. Extremities: No clubbing or cyanosis.  HEENT: Normal.   Assessment/Plan: 1. Pulmonary HTN: Patient has severe pulmonary arterial HTN.  Last RHC in 3/15 showed CI 1.97, PVR 9.  It is possible that long-standing PCWP elevation from diastolic LV dysfunction could lead to pulmonary vascular changes with pulmonary arterial hypertension out of proportion to the PCWP  elevation.  However, suspect idiopathic primary pulmonary HTN (Group 1). Collagen vascular disease workup was negative (negative RF, ANA and negative anti-SCL-70).  V/Q scan was not suggestive of chronic PEs.  PFTs were suggestive of restrictive lung disease but CT chest and evaluation by pulmonary did not suggest interstitial  lung disease.  Sleep study did not show OSA.  Last echo in 10/18 showed normal RV with PA systolic pressure 62 mmHg.  6 minute walk today was stable.    - Continue oxygen at night and with exertion as needed (has not needed during the day).  - Continue Tyvaso, macitentan, Adcirca.  She is doing well on this regimen and is no longer having trouble getting her meds. - I will arrange for repeat echo in 10/19.  2. Chronic diastolic CHF: EF preserved on echo with severe concentric LVH and a small pericardial effusion.  It is possible that the LVH is due to years of HTN.  The cardiac MRI was not definitively suggestive of cardiac amyloidosis.  I was still concerned for amyloidosis given the appearance of the LV myocardium on echoes.  SPEP/UPEP negative.  Abdominal fat pad biopsy showed no evidence for amyloidosis. PYP scan in 10/18 was not suggestive of TTR amyloidosis. Volume status looks ok today, NYHA class II.  - Continue torsemide 30 mg bid. BMET today.  3. HTN: BP is controlled.  4. Chronic atrial fibrillation: Continue coumadin.  She is off metoprolol due to bradycardia. Holter off metoprolol did not show any high rate episodes.  CBC today.  5. Hyperlipidemia: On Crestor, good lipids in 10/18.   Followup in 4 months.  [name]  03/11/2018

## 2018-03-24 DIAGNOSIS — H35371 Puckering of macula, right eye: Secondary | ICD-10-CM | POA: Diagnosis not present

## 2018-03-24 DIAGNOSIS — H25812 Combined forms of age-related cataract, left eye: Secondary | ICD-10-CM | POA: Diagnosis not present

## 2018-03-24 DIAGNOSIS — H401112 Primary open-angle glaucoma, right eye, moderate stage: Secondary | ICD-10-CM | POA: Diagnosis not present

## 2018-03-24 DIAGNOSIS — Z961 Presence of intraocular lens: Secondary | ICD-10-CM | POA: Diagnosis not present

## 2018-03-25 ENCOUNTER — Other Ambulatory Visit (HOSPITAL_COMMUNITY): Payer: Self-pay | Admitting: *Deleted

## 2018-03-25 DIAGNOSIS — I272 Pulmonary hypertension, unspecified: Secondary | ICD-10-CM

## 2018-03-25 MED ORDER — TADALAFIL (PAH) 20 MG PO TABS: 40.0000 mg | ORAL_TABLET | Freq: Every day | ORAL | 2 refills | Status: DC

## 2018-03-25 MED ORDER — TREPROSTINIL 0.6 MG/ML IN SOLN: 54.0000 ug | Freq: Four times a day (QID) | RESPIRATORY_TRACT | 5 refills | Status: DC

## 2018-03-28 ENCOUNTER — Telehealth (HOSPITAL_COMMUNITY): Payer: Self-pay | Admitting: Pharmacist

## 2018-03-28 NOTE — Telephone Encounter (Signed)
P[employer] foundation grant approved for [facility] medications. Patient will have $5300 to use toward her [facility] medication copays through 06/30/18.   ID: [id number] Group: [id number]  Ruta Hinds. Velva Harman, PharmD, BCPS, CPP Clinical Pharmacist Phone: [phone] 03/28/2018 9:12 AM

## 2018-03-31 ENCOUNTER — Other Ambulatory Visit: Payer: Self-pay

## 2018-03-31 ENCOUNTER — Ambulatory Visit (HOSPITAL_COMMUNITY): Payer: Medicare Other | Attending: Cardiovascular Disease

## 2018-03-31 DIAGNOSIS — I110 Hypertensive heart disease with heart failure: Secondary | ICD-10-CM | POA: Insufficient documentation

## 2018-03-31 DIAGNOSIS — I081 Rheumatic disorders of both mitral and tricuspid valves: Secondary | ICD-10-CM | POA: Diagnosis not present

## 2018-03-31 DIAGNOSIS — R001 Bradycardia, unspecified: Secondary | ICD-10-CM | POA: Insufficient documentation

## 2018-03-31 DIAGNOSIS — I5032 Chronic diastolic (congestive) heart failure: Secondary | ICD-10-CM | POA: Diagnosis not present

## 2018-03-31 DIAGNOSIS — E785 Hyperlipidemia, unspecified: Secondary | ICD-10-CM | POA: Insufficient documentation

## 2018-03-31 DIAGNOSIS — I313 Pericardial effusion (noninflammatory): Secondary | ICD-10-CM | POA: Insufficient documentation

## 2018-03-31 DIAGNOSIS — I272 Pulmonary hypertension, unspecified: Secondary | ICD-10-CM

## 2018-03-31 DIAGNOSIS — I4891 Unspecified atrial fibrillation: Secondary | ICD-10-CM | POA: Insufficient documentation

## 2018-04-07 ENCOUNTER — Telehealth (HOSPITAL_COMMUNITY): Payer: Self-pay

## 2018-04-07 NOTE — Telephone Encounter (Signed)
Pt called voice mail left for pt to call back to get ECHO results Normal EF, mild pulmonary hypertension

## 2018-04-07 NOTE — Telephone Encounter (Signed)
Pt called back given results of her Echo,

## 2018-04-15 ENCOUNTER — Telehealth (HOSPITAL_COMMUNITY): Payer: Self-pay | Admitting: Pharmacist

## 2018-04-15 NOTE — Telephone Encounter (Signed)
[facility] RN visit for Tyvaso on 04/12/18:  Teaching completed for TD 300 inhalation device. Patient able to demonstrate usage of device appropriately. Her current dose is 9 breaths QID.   Ruta Hinds. Velva Harman, PharmD, BCPS, CPP Clinical Pharmacist Phone: [phone] 04/15/2018 12:34 PM

## 2018-04-21 ENCOUNTER — Ambulatory Visit (INDEPENDENT_AMBULATORY_CARE_PROVIDER_SITE_OTHER): Payer: Medicare Other | Admitting: *Deleted

## 2018-04-21 DIAGNOSIS — I4891 Unspecified atrial fibrillation: Secondary | ICD-10-CM

## 2018-04-21 DIAGNOSIS — Z5181 Encounter for therapeutic drug level monitoring: Secondary | ICD-10-CM

## 2018-04-21 LAB — POCT INR: INR: 2.1 (ref 2.0–3.0)

## 2018-04-21 NOTE — Patient Instructions (Signed)
Description   Continue same dose 1 tablet every day except 1/2 tablet on Wednesdays.  Recheck in 6 weeks. Call with any medications changes [phone].

## 2018-04-22 DIAGNOSIS — Z23 Encounter for immunization: Secondary | ICD-10-CM | POA: Diagnosis not present

## 2018-05-22 ENCOUNTER — Other Ambulatory Visit (HOSPITAL_COMMUNITY): Payer: Self-pay | Admitting: Cardiology

## 2018-05-30 ENCOUNTER — Telehealth (HOSPITAL_COMMUNITY): Payer: Self-pay | Admitting: Pharmacist

## 2018-05-30 NOTE — Telephone Encounter (Signed)
[employer] patient assistance approved for Tyvaso for up to 1 calendar year.   Ruta Hinds. Velva Harman, PharmD, BCPS, CPP Clinical Pharmacist Phone: [phone] 05/30/2018 11:28 AM

## 2018-06-02 ENCOUNTER — Ambulatory Visit (INDEPENDENT_AMBULATORY_CARE_PROVIDER_SITE_OTHER): Payer: Medicare Other | Admitting: *Deleted

## 2018-06-02 DIAGNOSIS — Z5181 Encounter for therapeutic drug level monitoring: Secondary | ICD-10-CM

## 2018-06-02 DIAGNOSIS — I4891 Unspecified atrial fibrillation: Secondary | ICD-10-CM

## 2018-06-02 LAB — POCT INR: INR: 2.7 (ref 2.0–3.0)

## 2018-06-02 NOTE — Patient Instructions (Signed)
Description   Continue same dose 1 tablet every day except 1/2 tablet on Wednesdays.  Recheck in 6 weeks. Call with any medications changes [phone].     

## 2018-06-30 DIAGNOSIS — I10 Essential (primary) hypertension: Secondary | ICD-10-CM | POA: Diagnosis not present

## 2018-06-30 DIAGNOSIS — R778 Other specified abnormalities of plasma proteins: Secondary | ICD-10-CM | POA: Diagnosis not present

## 2018-06-30 DIAGNOSIS — D696 Thrombocytopenia, unspecified: Secondary | ICD-10-CM | POA: Diagnosis not present

## 2018-06-30 DIAGNOSIS — E7800 Pure hypercholesterolemia, unspecified: Secondary | ICD-10-CM | POA: Diagnosis not present

## 2018-07-07 DIAGNOSIS — Z0000 Encounter for general adult medical examination without abnormal findings: Secondary | ICD-10-CM | POA: Diagnosis not present

## 2018-07-07 DIAGNOSIS — I10 Essential (primary) hypertension: Secondary | ICD-10-CM | POA: Diagnosis not present

## 2018-07-07 DIAGNOSIS — E7800 Pure hypercholesterolemia, unspecified: Secondary | ICD-10-CM | POA: Diagnosis not present

## 2018-07-07 DIAGNOSIS — Z1212 Encounter for screening for malignant neoplasm of rectum: Secondary | ICD-10-CM | POA: Diagnosis not present

## 2018-07-07 DIAGNOSIS — I4891 Unspecified atrial fibrillation: Secondary | ICD-10-CM | POA: Diagnosis not present

## 2018-07-14 ENCOUNTER — Ambulatory Visit (INDEPENDENT_AMBULATORY_CARE_PROVIDER_SITE_OTHER): Payer: Medicare HMO

## 2018-07-14 DIAGNOSIS — Z5181 Encounter for therapeutic drug level monitoring: Secondary | ICD-10-CM

## 2018-07-14 DIAGNOSIS — I4891 Unspecified atrial fibrillation: Secondary | ICD-10-CM | POA: Diagnosis not present

## 2018-07-14 LAB — POCT INR: INR: 2.3 (ref 2.0–3.0)

## 2018-07-14 NOTE — Patient Instructions (Signed)
Description   Continue same dose 1 tablet every day except 1/2 tablet on Wednesdays.  Recheck in 6 weeks. Call with any medications changes [phone].

## 2018-07-26 DIAGNOSIS — I509 Heart failure, unspecified: Secondary | ICD-10-CM | POA: Diagnosis not present

## 2018-08-03 ENCOUNTER — Other Ambulatory Visit (HOSPITAL_COMMUNITY): Payer: Self-pay | Admitting: Cardiology

## 2018-08-03 DIAGNOSIS — Z9861 Coronary angioplasty status: Secondary | ICD-10-CM

## 2018-08-03 DIAGNOSIS — I4891 Unspecified atrial fibrillation: Secondary | ICD-10-CM

## 2018-08-25 ENCOUNTER — Ambulatory Visit: Payer: Medicare HMO | Admitting: Pharmacist

## 2018-08-25 DIAGNOSIS — H3561 Retinal hemorrhage, right eye: Secondary | ICD-10-CM | POA: Diagnosis not present

## 2018-08-25 DIAGNOSIS — H35073 Retinal telangiectasis, bilateral: Secondary | ICD-10-CM | POA: Diagnosis not present

## 2018-08-25 DIAGNOSIS — Z5181 Encounter for therapeutic drug level monitoring: Secondary | ICD-10-CM

## 2018-08-25 DIAGNOSIS — H40013 Open angle with borderline findings, low risk, bilateral: Secondary | ICD-10-CM | POA: Diagnosis not present

## 2018-08-25 DIAGNOSIS — I4891 Unspecified atrial fibrillation: Secondary | ICD-10-CM | POA: Diagnosis not present

## 2018-08-25 DIAGNOSIS — H35351 Cystoid macular degeneration, right eye: Secondary | ICD-10-CM | POA: Diagnosis not present

## 2018-08-25 DIAGNOSIS — H35051 Retinal neovascularization, unspecified, right eye: Secondary | ICD-10-CM | POA: Diagnosis not present

## 2018-08-25 DIAGNOSIS — Z961 Presence of intraocular lens: Secondary | ICD-10-CM | POA: Diagnosis not present

## 2018-08-25 LAB — POCT INR: INR: 1.8 — AB (ref 2.0–3.0)

## 2018-08-25 NOTE — Patient Instructions (Signed)
Continue same dose 1 tablet every day except 1/2 tablet on Wednesdays.  Recheck in 6 weeks. Call with any medications changes [phone].

## 2018-08-26 DIAGNOSIS — I509 Heart failure, unspecified: Secondary | ICD-10-CM | POA: Diagnosis not present

## 2018-08-29 ENCOUNTER — Other Ambulatory Visit (HOSPITAL_COMMUNITY): Payer: Self-pay | Admitting: Cardiology

## 2018-09-15 ENCOUNTER — Encounter (HOSPITAL_COMMUNITY): Payer: Medicare Other | Admitting: Cardiology

## 2018-09-24 DIAGNOSIS — I509 Heart failure, unspecified: Secondary | ICD-10-CM | POA: Diagnosis not present

## 2018-09-26 DIAGNOSIS — I272 Pulmonary hypertension, unspecified: Secondary | ICD-10-CM | POA: Diagnosis not present

## 2018-09-26 DIAGNOSIS — I110 Hypertensive heart disease with heart failure: Secondary | ICD-10-CM | POA: Diagnosis not present

## 2018-09-26 DIAGNOSIS — Z7901 Long term (current) use of anticoagulants: Secondary | ICD-10-CM | POA: Diagnosis not present

## 2018-09-26 DIAGNOSIS — Z6830 Body mass index (BMI) 30.0-30.9, adult: Secondary | ICD-10-CM | POA: Diagnosis not present

## 2018-09-26 DIAGNOSIS — I4891 Unspecified atrial fibrillation: Secondary | ICD-10-CM | POA: Diagnosis not present

## 2018-09-26 DIAGNOSIS — E669 Obesity, unspecified: Secondary | ICD-10-CM | POA: Diagnosis not present

## 2018-09-26 DIAGNOSIS — I509 Heart failure, unspecified: Secondary | ICD-10-CM | POA: Diagnosis not present

## 2018-09-26 DIAGNOSIS — E785 Hyperlipidemia, unspecified: Secondary | ICD-10-CM | POA: Diagnosis not present

## 2018-09-26 DIAGNOSIS — Z9981 Dependence on supplemental oxygen: Secondary | ICD-10-CM | POA: Diagnosis not present

## 2018-09-26 DIAGNOSIS — Z8249 Family history of ischemic heart disease and other diseases of the circulatory system: Secondary | ICD-10-CM | POA: Diagnosis not present

## 2018-10-05 ENCOUNTER — Telehealth: Payer: Self-pay

## 2018-10-05 ENCOUNTER — Telehealth: Payer: Self-pay | Admitting: *Deleted

## 2018-10-05 NOTE — Telephone Encounter (Signed)
1. Do you currently have a fever? NO (yes = cancel and refer to pcp for e-visit) 2. Have you recently travelled on a cruise, internationally, or to [place], [place], [place], [place], [place], or [place], [place] [employer])? NO (yes = cancel, stay home, monitor symptoms, and contact pcp or initiate e-visit if symptoms develop) 3. Have you been in contact with someone that is currently pending confirmation of Covid19 testing or has been confirmed to have the [place] virus? NO (yes = cancel, stay home, away from tested individual, monitor symptoms, and contact pcp or initiate e-visit if symptoms develop) 4. Are you currently experiencing fatigue or cough? NO (yes = pt should be prepared to have a mask placed at the time of their visit).  Pt. Advised that we are restricting visitors at this time and anyone present in the vehicle should meet the above criteria as well. Advised that visit will be at curbside for finger stick ONLY and will receive call with instructions. Pt also advised to please bring own pen for signature of arrival document.

## 2018-10-05 NOTE — Telephone Encounter (Signed)
lmom for prescreen/drive thru

## 2018-10-06 ENCOUNTER — Ambulatory Visit (INDEPENDENT_AMBULATORY_CARE_PROVIDER_SITE_OTHER): Payer: Medicare HMO | Admitting: *Deleted

## 2018-10-06 ENCOUNTER — Other Ambulatory Visit: Payer: Self-pay

## 2018-10-06 DIAGNOSIS — I4891 Unspecified atrial fibrillation: Secondary | ICD-10-CM | POA: Diagnosis not present

## 2018-10-06 DIAGNOSIS — Z5181 Encounter for therapeutic drug level monitoring: Secondary | ICD-10-CM

## 2018-10-06 LAB — POCT INR: INR: 1.9 — AB (ref 2.0–3.0)

## 2018-10-06 NOTE — Patient Instructions (Signed)
Description   Spoke with pt and instructed to take 1.5 tablets today then start taking 1 tablet every day. Recheck in 4 weeks. Call with any medications changes [phone].

## 2018-10-25 DIAGNOSIS — I509 Heart failure, unspecified: Secondary | ICD-10-CM | POA: Diagnosis not present

## 2018-11-01 ENCOUNTER — Telehealth: Payer: Self-pay | Admitting: *Deleted

## 2018-11-01 ENCOUNTER — Telehealth: Payer: Self-pay

## 2018-11-01 NOTE — Telephone Encounter (Signed)
lmom for prescreen  

## 2018-11-01 NOTE — Telephone Encounter (Signed)
1. Do you currently have a fever? No (yes = cancel and refer to pcp for e-visit) 2. Have you recently travelled on a cruise, internationally, or to [place], [place], [place], [place], [place], or [place], [place] [employer])? No (yes = cancel, stay home, monitor symptoms, and contact pcp or initiate e-visit if symptoms develop) 3. Have you been in contact with someone that is currently pending confirmation of Covid19 testing or has been confirmed to have the [place] virus? No (yes = cancel, stay home, away from tested individual, monitor symptoms, and contact pcp or initiate e-visit if symptoms develop) 4. Are you currently experiencing fatigue or cough? No (yes = pt should be prepared to have a mask placed at the time of their visit).  Pt. Advised that we are restricting visitors at this time and anyone present in the vehicle should meet the above criteria as well. Advised that visit will be at curbside for finger stick ONLY and will receive call with instructions. Pt also advised to please bring own pen for signature of arrival document.

## 2018-11-03 ENCOUNTER — Ambulatory Visit (HOSPITAL_BASED_OUTPATIENT_CLINIC_OR_DEPARTMENT_OTHER)
Admission: RE | Admit: 2018-11-03 | Discharge: 2018-11-03 | Disposition: A | Discharge status: Home / Self Care | Payer: Medicare HMO | Source: Ambulatory Visit | Attending: Cardiology | Admitting: Cardiology

## 2018-11-03 ENCOUNTER — Ambulatory Visit (INDEPENDENT_AMBULATORY_CARE_PROVIDER_SITE_OTHER): Payer: Medicare HMO | Admitting: *Deleted

## 2018-11-03 ENCOUNTER — Ambulatory Visit (HOSPITAL_COMMUNITY)
Admission: RE | Admit: 2018-11-03 | Discharge: 2018-11-03 | Disposition: A | Discharge status: Home / Self Care | Payer: Medicare HMO | Source: Ambulatory Visit | Attending: Cardiology | Admitting: Cardiology

## 2018-11-03 ENCOUNTER — Other Ambulatory Visit: Payer: Self-pay

## 2018-11-03 DIAGNOSIS — I313 Pericardial effusion (noninflammatory): Secondary | ICD-10-CM | POA: Insufficient documentation

## 2018-11-03 DIAGNOSIS — I480 Paroxysmal atrial fibrillation: Secondary | ICD-10-CM | POA: Diagnosis not present

## 2018-11-03 DIAGNOSIS — Z7901 Long term (current) use of anticoagulants: Secondary | ICD-10-CM | POA: Insufficient documentation

## 2018-11-03 DIAGNOSIS — I4891 Unspecified atrial fibrillation: Secondary | ICD-10-CM

## 2018-11-03 DIAGNOSIS — Z79899 Other long term (current) drug therapy: Secondary | ICD-10-CM | POA: Diagnosis not present

## 2018-11-03 DIAGNOSIS — I272 Pulmonary hypertension, unspecified: Secondary | ICD-10-CM

## 2018-11-03 DIAGNOSIS — R0602 Shortness of breath: Secondary | ICD-10-CM | POA: Diagnosis not present

## 2018-11-03 DIAGNOSIS — E785 Hyperlipidemia, unspecified: Secondary | ICD-10-CM | POA: Diagnosis not present

## 2018-11-03 DIAGNOSIS — I5032 Chronic diastolic (congestive) heart failure: Secondary | ICD-10-CM

## 2018-11-03 DIAGNOSIS — I2721 Secondary pulmonary arterial hypertension: Secondary | ICD-10-CM | POA: Diagnosis not present

## 2018-11-03 DIAGNOSIS — Z5181 Encounter for therapeutic drug level monitoring: Secondary | ICD-10-CM | POA: Diagnosis not present

## 2018-11-03 DIAGNOSIS — I482 Chronic atrial fibrillation, unspecified: Secondary | ICD-10-CM | POA: Insufficient documentation

## 2018-11-03 DIAGNOSIS — Z8249 Family history of ischemic heart disease and other diseases of the circulatory system: Secondary | ICD-10-CM | POA: Insufficient documentation

## 2018-11-03 DIAGNOSIS — I110 Hypertensive heart disease with heart failure: Secondary | ICD-10-CM | POA: Insufficient documentation

## 2018-11-03 LAB — BASIC METABOLIC PANEL
Anion gap: 8 (ref 5–15)
BUN: 14 mg/dL (ref 8–23)
CO2: 28 mmol/L (ref 22–32)
Calcium: 9.3 mg/dL (ref 8.9–10.3)
Chloride: 104 mmol/L (ref 98–111)
Creatinine, Ser: 1.3 mg/dL — ABNORMAL HIGH (ref 0.44–1.00)
GFR calc Af Amer: 46 mL/min — ABNORMAL LOW (ref >60)
GFR calc non Af Amer: 40 mL/min — ABNORMAL LOW (ref >60)
Glucose, Bld: 98 mg/dL (ref 70–99)
Potassium: 3.9 mmol/L (ref 3.5–5.1)
Sodium: 140 mmol/L (ref 135–145)

## 2018-11-03 LAB — LIPID PANEL
Cholesterol: 132 mg/dL (ref 0–200)
HDL: 55 mg/dL (ref >40)
LDL Cholesterol: 60 mg/dL (ref 0–99)
Total CHOL/HDL Ratio: 2.4 RATIO
Triglycerides: 83 mg/dL (ref <150)
VLDL: 17 mg/dL (ref 0–40)

## 2018-11-03 LAB — CBC
HCT: 39.6 % (ref 36.0–46.0)
Hemoglobin: 12.2 g/dL (ref 12.0–15.0)
MCH: 30.6 pg (ref 26.0–34.0)
MCHC: 30.8 g/dL (ref 30.0–36.0)
MCV: 99.2 fL (ref 80.0–100.0)
Platelets: 104 10*3/uL — ABNORMAL LOW (ref 150–400)
RBC: 3.99 MIL/uL (ref 3.87–5.11)
RDW: 13.7 % (ref 11.5–15.5)
WBC: 4.7 10*3/uL (ref 4.0–10.5)
nRBC: 0 % (ref 0.0–0.2)

## 2018-11-03 LAB — POCT INR: INR: 2.6 (ref 2.0–3.0)

## 2018-11-03 NOTE — Patient Instructions (Signed)
Lab work was done today. We will notify you of any abnormal lab work.  Please follow up with Dr. Aundra Dubin Sept. 9th at 2:00pm. The parking code for Sept is [id number].

## 2018-11-04 NOTE — Progress Notes (Signed)
Heart Failure TeleHealth Note  Due to national recommendations of social distancing due to [place] 19, Audio/video telehealth visit is felt to be most appropriate for this patient at this time.  See MyChart message from today for patient consent regarding telehealth for [facility].  Date:  11/04/2018   ID:  Vicki [name], DOB March 18, 1941, MRN [id number]  Location: Home  Provider location: [place] [facility] Type of Visit: Established patient   PCP:  Deland Pretty, MD  Cardiologist:  Dr. Aundra Dubin  Chief Complaint: Shortness of breath   History of Present Illness: Vicki [name] is a 78 y.o. female who presents via audio/video conferencing for a telehealth visit today.     she denies symptoms worrisome for COVID 19.   Patient has a history of chronic diastolic CHF, pulmonary hypertension and chronic atrial fibrillation.  Patient was followed by Dr. Glade Lloyd in the past for chronic atrial fibrillation.  She has been on coumadin.  She reports progressive exertional dyspnea since 2011.  This gradually worsened and became quite significant over the last few months.  She used to have significant HTN, but more recently her BP has been on the lower side.  Echo was done in 2/14, showing severe concentric LVH with EF 55-60%, moderately dilated RV, moderate to severe TR, and PA systolic pressure 86 mmHg.  I did a right heart cath in 5/14.  This showed PA pressure 104/36 with PCWP 20, suggesting pulmonary arterial HTN well out of proportion to the mildly elevate wedge pressure.  She was already on amlodipine so I did not do vasodilator testing.  V/Q scan was done, showing no evidence for chronic PEs.  PFTs showed a restrictive defect. Cardiac MRI did not show definite evidence for amyloid.  I started her on macitentan 10 mg daily.  Initially, she felt better on macitentan.  However, she was admitted in 5/14 from her sleep study due to orthopnea and dyspnea.  She was diuresed for several days  and diuretic was switched over to torsemide.  I next started her on tadalafil 20 mg daily and titrated up to 40 mg daily.  She thinks that this helped.  She saw pulmonary after chest CT (showed mosaic attenuation in lungs).  This was thought to be due to air-trapping rather than ILD.  She was started on Spiriva. She wears oxygen at home.   She had an echocardiogram in 2/15 that showed severe LVH, EF 75%, small pericardial effusion, mildly dilated RV with mildly decreased systolic function, PA systolic pressure 84 mmHg.  She is not totally sure if she was taking macitentan and tadalafil at that time.  She has had a hard month.  She ran out of her macitentan and tadalafil and her insurance company refused to refill them.  After this, she took a decided turn for the worse.  [place] her [place] in the fall.  She became much more short of breath, just with walking around the house. She developed lightheaded spells, especially with micturation.  Given lightheadedness and presyncope, she was admitted last week.  She was restarted on her medications and she finally got back on her meds at home.  In the hospital, she was noted to be bradycardic with HR to 30s so metoprolol was stopped.  Of note, her abdominal fat pad biopsy did not suggest amyloidosis. Repeat RHC in 3/15 still with moderate to severe PAH and low CI.  Holter off beta blocker in 3/15 showed average HR 73.   Echo in 9/17 showed EF 65-70% with moderate to severe LVH, moderate MR, normal RV size with mildly decreased systolic function, PASP 56 mmHg, small pericardial effusion.   Echo in 10/18 showed EF 60-65% with moderate LVH, moderate MR, severe biatrial enlargement, PASP 62 mmHg, RV normal.  PYP scan in 10/18 was not suggestive of TTR amyloidosis.   Echo in 10/19 showed EF 65-70%, moderate MR, normal RV size and systolic function, PASP 43 mmHg.   She has been doing well recently. On  Tyvaso, tadalafil, and macitentan for pulmonary hypertension, able to get all meds without problems. Weight has been stable.  Good oxygen saturation.  She has chronic bendopnea.  No chest pain.  No dyspnea walking on flat ground.  No lightheadedness/syncope.  No edema.    6 minute walk (5/14): 122 m.   6 minute walk (7/14): 152 m 6 minute walk (10/14): 183 m 6 minute walk (4/15): 317 m 6 minute walk (12/15): 259 m 6 minute walk (4/16): 293 m 6 minute walk (8/16): 341 m 6 minute walk (12/16): 274 m 6 minute walk (6/17): 314 m 6 minute walk (9/17): 307 m 6 minute walk (4/18): 366 m 6 minute walk (10/18): 320 m 6 minute walk (3/19): 381 m 6 minute walk (9/19): 366 m  Labs (12/13): HCT 45.1, plts 153, K 3.5, creatinine 1.0 Labs (1/14): BNP 849 Labs (4/14): K 3.1, creatinine 1.0, ANA and anti-SCL-70 antibody negative.  Rheumatoid factor negative.  Serum immunofixation did not show monoclonal light chains.  Labs (5/14): K 3.3, creatinine 0.79, proBNP 5147 Labs (6/14): K 4, creatinine 0.9, BNP 1001 Labs (10/14): LDL 53, LDL-P 975, K 3.7, creatinine 1.2 Labs (11/14): K 3.7, creatinine 0.9, BNP 832 Labs (2/15): K 3.5, creatinine 1.10, HCT 42.6, UPEP negative, SPEP negative.  Labs (3/15): K 3.8, creatinine 0.88 Labs (4/15): K 3.7, creatinine 0.99, proBNP 1653 Labs (7/15): K 4, creatinine 0.93 Labs (12/15): LDL 77, HDL 58, K 3.9, creatinine 1.03, LFTs normal Labs (4/16): K 3.8, creatinine 0.93, BNP 475, plts 105, HCT 42.3 Labs (11/16): K 3.6, creatinine 1.1, HCT 42.2 Labs (12/16): LDL 54, HDL 50, BNP 628 Labs (6/17): K 3.7, creatinine 0.97, HCT 41.3, BNP 489 Labs (9/17): K 3.8, creatinine 0.97, LDL 65, HDL 54 Labs (9/18): K 4.1, creatinine 1.05, hgb 14.6, BNP 646 Labs (10/18): BNP 588, TSH normal, LDL 73, HDL 48, K 3.5, creatinine 0.97, hgb 12.5, plts 91, LFTs normal Labs (3/19): K 4.1, creatinine 1.07, hgb 13.2 Labs (9/19): K 3.8, creatinine 1.1  PMH: 1. Chronic diastolic CHF: Echo  (2/58) with EF 55-60%, severe LVH (no SAM, no asymmetric hypertrophy, no LVOT gradient), moderate-severe LAE, moderately dilated RV with mildly decreased systolic function, moderate to severe RAE, PA systolic pressure 86 mmHg, moderate-severe TR, moderate MR, trivial pericardial effusion.  Cardiac MRI (5/14): EF 65%, severe LVH, no definite evidence for amyloidosis (no delayed enhancement, myocardium not difficult to null).  Echo (2/15) with EF 75%, severe LVH, grade II diastolic dysfunction, moderate MR, RV mildly dilated with mildly decreased systolic function, moderate TR, PA systolic pressure 84 mmHg, small pericardial effusion. Abdominal fat pad biopsy (2/15) showed no evidence for amyloidosis.  SPEP/UPEP negative 2/15.  - Echo (4/16) with EF 60-65%, severe LVH, RV mildly dilated with mildly decreased systolic function, PA systolic pressure 40 mmHg. - Echo (9/17) with EF 65-70%, severe LVH, moderate MR, normal RV size with mildly decreased systolic function, PASP  56 mmHg, small pericardial effusion.  - Echo (10/18): EF 60-65% with moderate LVH, moderate MR, severe biatrial enlargement, PASP 62 mmHg, RV normal. - PYP scan (10/18) was not suggestive of TTR amyloidosis.  - Echo (10/19): EF 65-70%, moderate MR, normal RV size and systolic function, PASP 43 mmHg. 2. Chronic atrial fibrillation since around 2004.  Developed bradycardia and metoprolol stopped 2/15. Holter (3/15) with average HR 73, atrial fibrillation, 3.8 sec pause x 1 while asleep, PVCs.  3. HTN: For decades.  4. LHC (2/08) with no significant disease.  5. Chronic thrombocytopenia: ITP 6. Pulmonary arterial HTN: RHC (5/14) with mean RA 13, PA 104/36 (mean 63), mean PCWP 20 on right and 23 on left, CI 2.3 (Fick) and 1.6 (thermo), PVR 10.4 WU (Fick) and 15 WU (thermo).  Vasodilator testing not done as patient was already on amlodipine.  V/Q scan (5/14) with no evidence for chronic PE.  ANA, RF, and anti-SCL70 antibody negative.  PFTs (5/14)  with FEV1 60%, FVC 54%, ratio 112%, TLC 61%, DLCO 43% => restrictive defect.  Sleep study (7/14) with no OSA.  CT chest with areas of mosaic attenuation in lungs (saw pulmonary, thought air trapping and not ILD). RHC (3/15) with RA mean 5, PA 66/31 mean 43, PCWP mean 11, Cardiac Index (Fick) 1.97, PVR 9.2 WU.  Patient was started on Tyvaso in 3/15.  Echo (4/16) with mildly dilated and mildly dysfunctional RV, PA systolic pressure 40 mmHg.   Current Outpatient Medications  Medication Sig Dispense Refill   acetaminophen (TYLENOL) 500 MG tablet Take 500 mg by mouth every 6 (six) hours as needed for mild pain or headache.      amLODipine (NORVASC) 2.5 MG tablet TAKE ONE TABLET BY MOUTH ONCE DAILY 30 tablet 0   Cholecalciferol (VITAMIN D3) 2000 units TABS Take 1 tablet by mouth daily.      KLOR-CON M20 20 MEQ tablet TAKE TWO TABLETS BY MOUTH IN THE MORNING AND ONE IN THE EVENING 90 tablet 0   OPSUMIT 10 MG tablet TAKE 1 TABLET (10 MG TOTAL) BY MOUTH DAILY WITH BREAKFAST. 30 tablet 11   rosuvastatin (CRESTOR) 20 MG tablet Take 20 mg by mouth at bedtime.      tadalafil, PAH, (ADCIRCA) 20 MG tablet Take 2 tablets (40 mg total) by mouth daily. 180 tablet 2   torsemide (DEMADEX) 20 MG tablet Take 1.5 tablets (30 mg total) by mouth 2 (two) times daily. 90 tablet 6   Treprostinil (TYVASO) 0.6 MG/ML SOLN Inhale 54 mcg into the lungs 4 (four) times daily. 30 mL 5   warfarin (COUMADIN) 5 MG tablet TAKE 1/2 TO 1 (ONE-HALF TO ONE) TABLET BY MOUTH ONCE DAILY AS DIRECTED BY  COUMADIN  CLINIC 90 tablet 0   No current facility-administered medications for this encounter.     Allergies:   Patient has no known allergies.   Social History:  The patient  reports that she has never smoked. She has never used smokeless tobacco. She reports current alcohol use of about 3.0 standard drinks of alcohol per week. She reports that she does not use drugs.   Family History:  The patient's family history includes  Alzheimer's disease in her mother; Heart Problems in her brother and maternal aunt; Heart attack in her father; Hypertension in her brother; Pulmonary Hypertension in her cousin; Thyroid disease in her daughter, daughter, and maternal aunt.   ROS:  Please see the history of present illness.   All other systems are personally reviewed  and negative.   Exam:  (Video/Tele Health Call; Exam is subjective and or/visual.) General:  Speaks in full sentences. No resp difficulty. Lungs: Normal respiratory effort with conversation.  Abdomen: Non-distended per patient report Extremities: Pt denies edema. Neuro: Alert & oriented x 3.   Recent Labs: 11/03/2018: BUN 14; Creatinine, Ser 1.30; Hemoglobin 12.2; Platelets 104; Potassium 3.9; Sodium 140  Personally reviewed   Wt Readings from Last 3 Encounters:  03/10/18 76.3 kg (168 lb 3.2 oz)  09/08/17 77.2 kg (170 lb 1.9 oz)  03/29/17 78.7 kg (173 lb 8 oz)      ASSESSMENT AND PLAN:  1. Pulmonary HTN: Patient has severe pulmonary arterial HTN.  Last RHC in 3/15 showed CI 1.97, PVR 9.  It is possible that long-standing PCWP elevation from diastolic LV dysfunction could lead to pulmonary vascular changes with pulmonary arterial hypertension out of proportion to the PCWP elevation.  However, suspect idiopathic primary pulmonary HTN (Group 1). Collagen vascular disease workup was negative (negative RF, ANA and negative anti-SCL-70).  V/Q scan was not suggestive of chronic PEs.  PFTs were suggestive of restrictive lung disease but CT chest and evaluation by pulmonary did not suggest interstitial lung disease.  Sleep study did not show OSA.  Last echo in 10/19 showed normal RV with PA systolic pressure 43 mmHg.   - 6 minute walk needs to be done at next appt.     - Continue oxygen at night and with exertion as needed (has not needed during the day).  - Continue Tyvaso, macitentan, Adcirca.  She is doing well on this regimen and is no longer having trouble getting  her meds. - I will arrange for repeat echo in 10/20.  2. Chronic diastolic CHF: EF preserved on echo with severe concentric LVH and a small pericardial effusion.  It is possible that the LVH is due to years of HTN.  The cardiac MRI was not definitively suggestive of cardiac amyloidosis.  I was still concerned for amyloidosis given the appearance of the LV myocardium on echoes.  SPEP/UPEP negative.  Abdominal fat pad biopsy showed no evidence for amyloidosis. PYP scan in 10/18 was not suggestive of TTR amyloidosis. Weight is stable, NYHA class II.  - Continue torsemide 30 mg bid. I will arrange for BMET.   3. HTN: BP is controlled.  4. Chronic atrial fibrillation: Continue coumadin.  She is off metoprolol due to bradycardia. Holter off metoprolol did not show any high rate episodes.  I will arrange for CBC.  5. Hyperlipidemia: On Crestor, I will check lipids.    COVID screen The patient does not have any symptoms that suggest any further testing/ screening at this time.  Social distancing reinforced today.  Patient Risk: After full review of this patients clinical status, I feel that they are at moderate risk for cardiac decompensation at this time.  Relevant cardiac medications were reviewed at length with the patient today. The patient does not have concerns regarding their medications at this time.   Recommended follow-up:  4 months  Today, I have spent 16 minutes with the patient with telehealth technology discussing the above issues .    Signed, [name], MD  11/04/2018  [facility] [address] [facility] [place] [address] [phone] (office) [phone] (fax)

## 2018-11-24 DIAGNOSIS — I509 Heart failure, unspecified: Secondary | ICD-10-CM | POA: Diagnosis not present

## 2018-11-27 ENCOUNTER — Other Ambulatory Visit (HOSPITAL_COMMUNITY): Payer: Self-pay | Admitting: Cardiology

## 2018-11-28 ENCOUNTER — Telehealth: Payer: Self-pay

## 2018-11-28 NOTE — Telephone Encounter (Signed)
lmom to move appt due to maximum capacity of 30 at the [facility] and to prescreen

## 2018-11-29 NOTE — Telephone Encounter (Signed)

## 2018-12-01 ENCOUNTER — Other Ambulatory Visit: Payer: Self-pay

## 2018-12-01 ENCOUNTER — Ambulatory Visit (INDEPENDENT_AMBULATORY_CARE_PROVIDER_SITE_OTHER): Payer: Medicare HMO

## 2018-12-01 DIAGNOSIS — I480 Paroxysmal atrial fibrillation: Secondary | ICD-10-CM | POA: Diagnosis not present

## 2018-12-01 DIAGNOSIS — I4891 Unspecified atrial fibrillation: Secondary | ICD-10-CM

## 2018-12-01 DIAGNOSIS — Z5181 Encounter for therapeutic drug level monitoring: Secondary | ICD-10-CM

## 2018-12-01 LAB — POCT INR: INR: 2.4 (ref 2.0–3.0)

## 2018-12-01 NOTE — Patient Instructions (Signed)
Description   Spoke with pt and instructed to continue 1 tablet every day. Recheck in 5 weeks. Call with any medications changes [phone].

## 2018-12-25 DIAGNOSIS — I509 Heart failure, unspecified: Secondary | ICD-10-CM | POA: Diagnosis not present

## 2018-12-29 ENCOUNTER — Telehealth: Payer: Self-pay

## 2018-12-29 NOTE — Telephone Encounter (Signed)
1. COVID-19 Pre-Screening Questions:  . In the past 7 to 10 days have you had a cough,  shortness of breath, headache, congestion, fever (100 or greater) body aches, chills, sore throat, or sudden loss of taste or sense of smell?  no . Have you been around anyone with known Covid 19.  no . Have you been around anyone who is awaiting Covid 19 test results in the past 7 to 10 days?  no . Have you been around anyone who has been exposed to Covid 19, or has mentioned symptoms of Covid 19 within the past 7 to 10 days?  no    2. Pt advised of visitor restrictions (no visitors allowed except if needed to conduct the visit). Also advised to arrive at appointment time and wear a mask.

## 2019-01-05 ENCOUNTER — Other Ambulatory Visit: Payer: Self-pay

## 2019-01-05 ENCOUNTER — Ambulatory Visit (INDEPENDENT_AMBULATORY_CARE_PROVIDER_SITE_OTHER): Payer: Medicare HMO | Admitting: *Deleted

## 2019-01-05 DIAGNOSIS — I4891 Unspecified atrial fibrillation: Secondary | ICD-10-CM

## 2019-01-05 DIAGNOSIS — Z5181 Encounter for therapeutic drug level monitoring: Secondary | ICD-10-CM | POA: Diagnosis not present

## 2019-01-05 DIAGNOSIS — I480 Paroxysmal atrial fibrillation: Secondary | ICD-10-CM

## 2019-01-05 LAB — POCT INR: INR: 2.9 (ref 2.0–3.0)

## 2019-01-05 NOTE — Patient Instructions (Signed)
Description    Continue taking 1 tablet every day. Recheck in 6 weeks. Call with any medications changes [phone].

## 2019-01-09 DIAGNOSIS — E7800 Pure hypercholesterolemia, unspecified: Secondary | ICD-10-CM | POA: Diagnosis not present

## 2019-01-12 DIAGNOSIS — I272 Pulmonary hypertension, unspecified: Secondary | ICD-10-CM | POA: Diagnosis not present

## 2019-01-12 DIAGNOSIS — I4891 Unspecified atrial fibrillation: Secondary | ICD-10-CM | POA: Diagnosis not present

## 2019-01-12 DIAGNOSIS — I10 Essential (primary) hypertension: Secondary | ICD-10-CM | POA: Diagnosis not present

## 2019-01-12 DIAGNOSIS — E7800 Pure hypercholesterolemia, unspecified: Secondary | ICD-10-CM | POA: Diagnosis not present

## 2019-01-18 DIAGNOSIS — Z1211 Encounter for screening for malignant neoplasm of colon: Secondary | ICD-10-CM | POA: Diagnosis not present

## 2019-01-18 DIAGNOSIS — Z1212 Encounter for screening for malignant neoplasm of rectum: Secondary | ICD-10-CM | POA: Diagnosis not present

## 2019-01-24 DIAGNOSIS — I509 Heart failure, unspecified: Secondary | ICD-10-CM | POA: Diagnosis not present

## 2019-02-07 ENCOUNTER — Other Ambulatory Visit (HOSPITAL_COMMUNITY): Payer: Self-pay

## 2019-02-07 DIAGNOSIS — I272 Pulmonary hypertension, unspecified: Secondary | ICD-10-CM

## 2019-02-07 MED ORDER — TADALAFIL (PAH) 20 MG PO TABS: 40.0000 mg | ORAL_TABLET | Freq: Every day | ORAL | 2 refills | Status: DC

## 2019-02-09 ENCOUNTER — Other Ambulatory Visit (HOSPITAL_COMMUNITY): Payer: Self-pay | Admitting: *Deleted

## 2019-02-16 ENCOUNTER — Ambulatory Visit (INDEPENDENT_AMBULATORY_CARE_PROVIDER_SITE_OTHER): Payer: Medicare HMO | Admitting: *Deleted

## 2019-02-16 ENCOUNTER — Other Ambulatory Visit: Payer: Self-pay

## 2019-02-16 DIAGNOSIS — Z5181 Encounter for therapeutic drug level monitoring: Secondary | ICD-10-CM | POA: Diagnosis not present

## 2019-02-16 DIAGNOSIS — I4891 Unspecified atrial fibrillation: Secondary | ICD-10-CM

## 2019-02-16 DIAGNOSIS — I480 Paroxysmal atrial fibrillation: Secondary | ICD-10-CM

## 2019-02-16 LAB — POCT INR: INR: 2.3 (ref 2.0–3.0)

## 2019-02-16 NOTE — Patient Instructions (Signed)
Description    Continue taking 1 tablet every day. Recheck in 6 weeks. Call with any medications changes [phone].

## 2019-02-23 DIAGNOSIS — Z961 Presence of intraocular lens: Secondary | ICD-10-CM | POA: Diagnosis not present

## 2019-02-23 DIAGNOSIS — H35051 Retinal neovascularization, unspecified, right eye: Secondary | ICD-10-CM | POA: Diagnosis not present

## 2019-02-23 DIAGNOSIS — H35351 Cystoid macular degeneration, right eye: Secondary | ICD-10-CM | POA: Diagnosis not present

## 2019-02-23 DIAGNOSIS — H35073 Retinal telangiectasis, bilateral: Secondary | ICD-10-CM | POA: Diagnosis not present

## 2019-02-23 DIAGNOSIS — H25812 Combined forms of age-related cataract, left eye: Secondary | ICD-10-CM | POA: Diagnosis not present

## 2019-02-23 DIAGNOSIS — H40013 Open angle with borderline findings, low risk, bilateral: Secondary | ICD-10-CM | POA: Diagnosis not present

## 2019-02-23 DIAGNOSIS — H35371 Puckering of macula, right eye: Secondary | ICD-10-CM | POA: Diagnosis not present

## 2019-02-24 DIAGNOSIS — I509 Heart failure, unspecified: Secondary | ICD-10-CM | POA: Diagnosis not present

## 2019-02-26 ENCOUNTER — Other Ambulatory Visit (HOSPITAL_COMMUNITY): Payer: Self-pay | Admitting: Cardiology

## 2019-02-27 ENCOUNTER — Other Ambulatory Visit (HOSPITAL_COMMUNITY): Payer: Self-pay

## 2019-02-27 MED ORDER — TYVASO 0.6 MG/ML IN SOLN: 54.0000 ug | Freq: Four times a day (QID) | RESPIRATORY_TRACT | 5 refills | Status: DC

## 2019-02-28 ENCOUNTER — Other Ambulatory Visit (HOSPITAL_COMMUNITY): Payer: Self-pay

## 2019-02-28 MED ORDER — TYVASO 0.6 MG/ML IN SOLN: 54.0000 ug | Freq: Four times a day (QID) | RESPIRATORY_TRACT | 5 refills | Status: DC

## 2019-03-02 ENCOUNTER — Other Ambulatory Visit (HOSPITAL_COMMUNITY): Payer: Self-pay

## 2019-03-02 MED ORDER — TYVASO 0.6 MG/ML IN SOLN: 54.0000 ug | Freq: Four times a day (QID) | RESPIRATORY_TRACT | 5 refills | Status: DC

## 2019-03-03 ENCOUNTER — Other Ambulatory Visit (HOSPITAL_COMMUNITY): Payer: Self-pay

## 2019-03-03 DIAGNOSIS — I272 Pulmonary hypertension, unspecified: Secondary | ICD-10-CM

## 2019-03-03 MED ORDER — TADALAFIL (PAH) 20 MG PO TABS: 40.0000 mg | ORAL_TABLET | Freq: Every day | ORAL | 2 refills | Status: DC

## 2019-03-03 NOTE — Telephone Encounter (Signed)
Team member received call from pt stating that she needs a refill of med to [facility] as she only has 5 days left. Tadalafil refilled to [facility]

## 2019-03-04 DIAGNOSIS — R69 Illness, unspecified: Secondary | ICD-10-CM | POA: Diagnosis not present

## 2019-03-08 ENCOUNTER — Ambulatory Visit (HOSPITAL_COMMUNITY)
Admission: RE | Admit: 2019-03-08 | Discharge: 2019-03-08 | Disposition: A | Discharge status: Home / Self Care | Payer: Medicare HMO | Source: Ambulatory Visit | Attending: Cardiology | Admitting: Cardiology

## 2019-03-08 ENCOUNTER — Other Ambulatory Visit: Payer: Self-pay

## 2019-03-08 ENCOUNTER — Encounter (HOSPITAL_COMMUNITY): Payer: Self-pay | Admitting: Cardiology

## 2019-03-08 VITALS — BP 124/76 | HR 78 | Wt 166.4 lb

## 2019-03-08 DIAGNOSIS — I5032 Chronic diastolic (congestive) heart failure: Secondary | ICD-10-CM | POA: Diagnosis not present

## 2019-03-08 DIAGNOSIS — I110 Hypertensive heart disease with heart failure: Secondary | ICD-10-CM | POA: Diagnosis not present

## 2019-03-08 DIAGNOSIS — I482 Chronic atrial fibrillation, unspecified: Secondary | ICD-10-CM | POA: Insufficient documentation

## 2019-03-08 DIAGNOSIS — Z7901 Long term (current) use of anticoagulants: Secondary | ICD-10-CM | POA: Diagnosis not present

## 2019-03-08 DIAGNOSIS — I272 Pulmonary hypertension, unspecified: Secondary | ICD-10-CM

## 2019-03-08 DIAGNOSIS — E785 Hyperlipidemia, unspecified: Secondary | ICD-10-CM | POA: Diagnosis not present

## 2019-03-08 DIAGNOSIS — Z79899 Other long term (current) drug therapy: Secondary | ICD-10-CM | POA: Insufficient documentation

## 2019-03-08 DIAGNOSIS — I480 Paroxysmal atrial fibrillation: Secondary | ICD-10-CM | POA: Diagnosis not present

## 2019-03-08 DIAGNOSIS — Z8249 Family history of ischemic heart disease and other diseases of the circulatory system: Secondary | ICD-10-CM | POA: Insufficient documentation

## 2019-03-08 DIAGNOSIS — I2721 Secondary pulmonary arterial hypertension: Secondary | ICD-10-CM | POA: Insufficient documentation

## 2019-03-08 LAB — BRAIN NATRIURETIC PEPTIDE: B Natriuretic Peptide: 632.1 pg/mL — ABNORMAL HIGH (ref 0.0–100.0)

## 2019-03-08 LAB — CBC
HCT: 39.8 % (ref 36.0–46.0)
Hemoglobin: 12.5 g/dL (ref 12.0–15.0)
MCH: 31.2 pg (ref 26.0–34.0)
MCHC: 31.4 g/dL (ref 30.0–36.0)
MCV: 99.3 fL (ref 80.0–100.0)
Platelets: 113 10*3/uL — ABNORMAL LOW (ref 150–400)
RBC: 4.01 MIL/uL (ref 3.87–5.11)
RDW: 13.9 % (ref 11.5–15.5)
WBC: 4.5 10*3/uL (ref 4.0–10.5)
nRBC: 0 % (ref 0.0–0.2)

## 2019-03-08 LAB — BASIC METABOLIC PANEL
Anion gap: 9 (ref 5–15)
BUN: 13 mg/dL (ref 8–23)
CO2: 27 mmol/L (ref 22–32)
Calcium: 9.3 mg/dL (ref 8.9–10.3)
Chloride: 104 mmol/L (ref 98–111)
Creatinine, Ser: 1.05 mg/dL — ABNORMAL HIGH (ref 0.44–1.00)
GFR calc Af Amer: 59 mL/min — ABNORMAL LOW (ref >60)
GFR calc non Af Amer: 51 mL/min — ABNORMAL LOW (ref >60)
Glucose, Bld: 103 mg/dL — ABNORMAL HIGH (ref 70–99)
Potassium: 4.2 mmol/L (ref 3.5–5.1)
Sodium: 140 mmol/L (ref 135–145)

## 2019-03-08 NOTE — Patient Instructions (Signed)
Labs today We will only contact you if something comes back abnormal or we need to make some changes. Otherwise no news is good news!  Your physician has requested that you have an echocardiogram. Echocardiography is a painless test that uses sound waves to create images of your heart. It provides your doctor with information about the size and shape of your heart and how well your heart's chambers and valves are working. This procedure takes approximately one hour. There are no restrictions for this procedure.  Your physician recommends that you schedule a follow-up appointment in: 3 months with an ECHO.  At the [facility], you and your health needs are our priority. As part of our continuing mission to provide you with exceptional heart care, we have created designated Provider Care Teams. These Care Teams include your primary Cardiologist (physician) and Advanced Practice Providers (APPs- Physician Assistants and Nurse Practitioners) who all work together to provide you with the care you need, when you need it.   You may see any of the following providers on your designated Care Team at your next follow up: Marland Kitchen Dr Glori Bickers . Dr [name] . Darrick Grinder, NP   Please be sure to bring in all your medications bottles to every appointment.

## 2019-03-09 NOTE — Progress Notes (Signed)
Date:  03/09/2019   ID:  Vicki [name], DOB 1941/03/05, MRN [id number]   Provider location: [facility] Type of Visit: Established patient   PCP:  Deland Pretty, MD  Cardiologist:  Dr. Aundra Dubin   History of Present Illness: Vicki [name] is a 78 y.o. female who has a history of chronic diastolic CHF, pulmonary hypertension and chronic atrial fibrillation.  Patient was followed by Dr. Glade Lloyd in the past for chronic atrial fibrillation.  She has been on coumadin.  She reports progressive exertional dyspnea since 2011.  This gradually worsened and became quite significant over the last few months.  She used to have significant HTN, but more recently her BP has been on the lower side.  Echo was done in 2/14, showing severe concentric LVH with EF 55-60%, moderately dilated RV, moderate to severe TR, and PA systolic pressure 86 mmHg.  I did a right heart cath in 5/14.  This showed PA pressure 104/36 with PCWP 20, suggesting pulmonary arterial HTN well out of proportion to the mildly elevate wedge pressure.  She was already on amlodipine so I did not do vasodilator testing.  V/Q scan was done, showing no evidence for chronic PEs.  PFTs showed a restrictive defect. Cardiac MRI did not show definite evidence for amyloid.  I started her on macitentan 10 mg daily.  Initially, she felt better on macitentan.  However, she was admitted in 5/14 from her sleep study due to orthopnea and dyspnea.  She was diuresed for several days and diuretic was switched over to torsemide.  I next started her on tadalafil 20 mg daily and titrated up to 40 mg daily.  She thinks that this helped.  She saw pulmonary after chest CT (showed mosaic attenuation in lungs).  This was thought to be due to air-trapping rather than ILD.  She was started on Spiriva. She wears oxygen at home.   She had an echocardiogram in 2/15 that showed severe LVH, EF 75%, small pericardial effusion, mildly dilated RV with mildly  decreased systolic function, PA systolic pressure 84 mmHg.  She is not totally sure if she was taking macitentan and tadalafil at that time.  She has had a hard month.  She ran out of her macitentan and tadalafil and her insurance company refused to refill them.  After this, she took a decided turn for the worse.  [place] in the fall.  She became much more short of breath, just with walking around the house. She developed lightheaded spells, especially with micturation.  Given lightheadedness and presyncope, she was admitted last week.  She was restarted on her medications and she finally got back on her meds at home.  In the hospital, she was noted to be bradycardic with HR to 30s so metoprolol was stopped.  Of note, her abdominal fat pad biopsy did not suggest amyloidosis. Repeat RHC in 3/15 still with moderate to severe PAH and low CI.  Holter off beta blocker in 3/15 showed average HR 73.   Echo in 9/17 showed EF 65-70% with moderate to severe LVH, moderate MR, normal RV size with mildly decreased systolic function, PASP 56 mmHg, small pericardial effusion.   Echo in 10/18 showed EF 60-65% with moderate LVH, moderate MR, severe biatrial enlargement, PASP 62 mmHg, RV normal.  PYP scan in 10/18 was not suggestive of TTR amyloidosis.   Echo in 10/19  showed EF 65-70%, moderate MR, normal RV size and systolic function, PASP 43 mmHg.   She returns for followup of CHF and pulmonary hypertension. On Tyvaso, tadalafil, and macitentan for pulmonary hypertension, able to get all meds without problems. Weight is down 2 lbs.  BP was initially elevated today but recheck as 124/76.  Rare palpitations.  No dyspnea walking on flat ground as long as she uses her oxygen.  No lightheadedness/syncope.  No chest pain.  No orthopnea/PND.      6 minute walk (5/14): 122 m.   6 minute walk (7/14): 152 m 6 minute walk (10/14): 183 m 6 minute walk  (4/15): 317 m 6 minute walk (12/15): 259 m 6 minute walk (4/16): 293 m 6 minute walk (8/16): 341 m 6 minute walk (12/16): 274 m 6 minute walk (6/17): 314 m 6 minute walk (9/17): 307 m 6 minute walk (4/18): 366 m 6 minute walk (10/18): 320 m 6 minute walk (3/19): 381 m 6 minute walk (9/19): 366 m 6 minute walk (9/20): 305 m  Labs (12/13): HCT 45.1, plts 153, K 3.5, creatinine 1.0 Labs (1/14): BNP 849 Labs (4/14): K 3.1, creatinine 1.0, ANA and anti-SCL-70 antibody negative.  Rheumatoid factor negative.  Serum immunofixation did not show monoclonal light chains.  Labs (5/14): K 3.3, creatinine 0.79, proBNP 5147 Labs (6/14): K 4, creatinine 0.9, BNP 1001 Labs (10/14): LDL 53, LDL-P 975, K 3.7, creatinine 1.2 Labs (11/14): K 3.7, creatinine 0.9, BNP 832 Labs (2/15): K 3.5, creatinine 1.10, HCT 42.6, UPEP negative, SPEP negative.  Labs (3/15): K 3.8, creatinine 0.88 Labs (4/15): K 3.7, creatinine 0.99, proBNP 1653 Labs (7/15): K 4, creatinine 0.93 Labs (12/15): LDL 77, HDL 58, K 3.9, creatinine 1.03, LFTs normal Labs (4/16): K 3.8, creatinine 0.93, BNP 475, plts 105, HCT 42.3 Labs (11/16): K 3.6, creatinine 1.1, HCT 42.2 Labs (12/16): LDL 54, HDL 50, BNP 628 Labs (6/17): K 3.7, creatinine 0.97, HCT 41.3, BNP 489 Labs (9/17): K 3.8, creatinine 0.97, LDL 65, HDL 54 Labs (9/18): K 4.1, creatinine 1.05, hgb 14.6, BNP 646 Labs (10/18): BNP 588, TSH normal, LDL 73, HDL 48, K 3.5, creatinine 0.97, hgb 12.5, plts 91, LFTs normal Labs (3/19): K 4.1, creatinine 1.07, hgb 13.2 Labs (9/19): K 3.8, creatinine 1.1 Labs (7/20): K 3.9, creatinine 1.3, LDL 63  PMH: 1. Chronic diastolic CHF: Echo (7/84) with EF 55-60%, severe LVH (no SAM, no asymmetric hypertrophy, no LVOT gradient), moderate-severe LAE, moderately dilated RV with mildly decreased systolic function, moderate to severe RAE, PA systolic pressure 86 mmHg, moderate-severe TR, moderate MR, trivial pericardial effusion.  Cardiac MRI  (5/14): EF 65%, severe LVH, no definite evidence for amyloidosis (no delayed enhancement, myocardium not difficult to null).  Echo (2/15) with EF 75%, severe LVH, grade II diastolic dysfunction, moderate MR, RV mildly dilated with mildly decreased systolic function, moderate TR, PA systolic pressure 84 mmHg, small pericardial effusion. Abdominal fat pad biopsy (2/15) showed no evidence for amyloidosis.  SPEP/UPEP negative 2/15.  - Echo (4/16) with EF 60-65%, severe LVH, RV mildly dilated with mildly decreased systolic function, PA systolic pressure 40 mmHg. - Echo (9/17) with EF 65-70%, severe LVH, moderate MR, normal RV size with mildly decreased systolic function, PASP 56 mmHg, small pericardial effusion.  - Echo (10/18): EF 60-65% with moderate LVH, moderate MR, severe biatrial enlargement, PASP 62 mmHg, RV normal. - PYP scan (10/18) was not suggestive of TTR amyloidosis.  - Echo (10/19): EF 65-70%, moderate MR, normal RV size  and systolic function, PASP 43 mmHg. 2. Chronic atrial fibrillation since around 2004.  Developed bradycardia and metoprolol stopped 2/15. Holter (3/15) with average HR 73, atrial fibrillation, 3.8 sec pause x 1 while asleep, PVCs.  3. HTN: For decades.  4. LHC (2/08) with no significant disease.  5. Chronic thrombocytopenia: ITP 6. Pulmonary arterial HTN: RHC (5/14) with mean RA 13, PA 104/36 (mean 63), mean PCWP 20 on right and 23 on left, CI 2.3 (Fick) and 1.6 (thermo), PVR 10.4 WU (Fick) and 15 WU (thermo).  Vasodilator testing not done as patient was already on amlodipine.  V/Q scan (5/14) with no evidence for chronic PE.  ANA, RF, and anti-SCL70 antibody negative.  PFTs (5/14) with FEV1 60%, FVC 54%, ratio 112%, TLC 61%, DLCO 43% => restrictive defect.  Sleep study (7/14) with no OSA.  CT chest with areas of mosaic attenuation in lungs (saw pulmonary, thought air trapping and not ILD). RHC (3/15) with RA mean 5, PA 66/31 mean 43, PCWP mean 11, Cardiac Index (Fick) 1.97, PVR  9.2 WU.  Patient was started on Tyvaso in 3/15.  Echo (4/16) with mildly dilated and mildly dysfunctional RV, PA systolic pressure 40 mmHg.   Current Outpatient Medications  Medication Sig Dispense Refill   acetaminophen (TYLENOL) 500 MG tablet Take 500 mg by mouth every 6 (six) hours as needed for mild pain or headache.      amLODipine (NORVASC) 2.5 MG tablet TAKE ONE TABLET BY MOUTH ONCE DAILY 30 tablet 0   Cholecalciferol (VITAMIN D3) 2000 units TABS Take 1 tablet by mouth daily.      KLOR-CON M20 20 MEQ tablet TAKE TWO TABLETS BY MOUTH IN THE MORNING AND ONE IN THE EVENING 90 tablet 0   OPSUMIT 10 MG tablet TAKE 1 TABLET (10 MG TOTAL) BY MOUTH DAILY WITH BREAKFAST. 30 tablet 11   rosuvastatin (CRESTOR) 20 MG tablet Take 20 mg by mouth at bedtime.      tadalafil, PAH, (ADCIRCA) 20 MG tablet Take 2 tablets (40 mg total) by mouth daily. 180 tablet 2   torsemide (DEMADEX) 20 MG tablet Take 1.5 tablets (30 mg total) by mouth 2 (two) times daily. 90 tablet 6   Treprostinil (TYVASO) 0.6 MG/ML SOLN Inhale 54 mcg into the lungs 4 (four) times daily. 30 mL 5   warfarin (COUMADIN) 5 MG tablet TAKE 1/2 TO 1 TABLET BY MOUTH ONCE DAILY AS  DIRECTED  BY  THE  COUMADIN  CLINIC 90 tablet 0   No current facility-administered medications for this encounter.     Allergies:   Patient has no known allergies.   Social History:  The patient  reports that she has never smoked. She has never used smokeless tobacco. She reports current alcohol use of about 3.0 standard drinks of alcohol per week. She reports that she does not use drugs.   Family History:  The patient's family history includes Alzheimer's disease in her mother; Heart Problems in her brother and maternal aunt; Heart attack in her father; Hypertension in her brother; Pulmonary Hypertension in her cousin; Thyroid disease in her daughter, daughter, and maternal aunt.   ROS:  Please see the history of present illness.   All other systems are  personally reviewed and negative.   Exam:   BP 124/76    Pulse 78    Wt 75.5 kg (166 lb 6.4 oz)    SpO2 94%    BMI 30.43 kg/m  General: NAD Neck: JVP 8 cm, no thyromegaly  or thyroid nodule.  Lungs: Clear to auscultation bilaterally with normal respiratory effort. CV: Nondisplaced PMI.  Heart regular S1/S2, no S3/S4, 2/6 early SEM RUSB.  No peripheral edema.  No carotid bruit.  Normal pedal pulses.  Abdomen: Soft, nontender, no hepatosplenomegaly, no distention.  Skin: Intact without lesions or rashes.  Neurologic: Alert and oriented x 3.  Psych: Normal affect. Extremities: No clubbing or cyanosis.  HEENT: Normal.   Recent Labs: 03/08/2019: B Natriuretic Peptide 632.1; BUN 13; Creatinine, Ser 1.05; Hemoglobin 12.5; Platelets 113; Potassium 4.2; Sodium 140  Personally reviewed   Wt Readings from Last 3 Encounters:  03/08/19 75.5 kg (166 lb 6.4 oz)  03/10/18 76.3 kg (168 lb 3.2 oz)  09/08/17 77.2 kg (170 lb 1.9 oz)    ASSESSMENT AND PLAN:  1. Pulmonary HTN: Patient has severe pulmonary arterial HTN.  Last RHC in 3/15 showed CI 1.97, PVR 9.  It is possible that long-standing PCWP elevation from diastolic LV dysfunction could lead to pulmonary vascular changes with pulmonary arterial hypertension out of proportion to the PCWP elevation.  However, suspect idiopathic primary pulmonary HTN (Group 1). Collagen vascular disease workup was negative (negative RF, ANA and negative anti-SCL-70).  V/Q scan was not suggestive of chronic PEs.  PFTs were suggestive of restrictive lung disease but CT chest and evaluation by pulmonary did not suggest interstitial lung disease.  Sleep study did not show OSA.  Last echo in 10/19 showed normal RV with PA systolic pressure 43 mmHg.  6 minute walk was done today, fairly stable.     - Continue oxygen at night and with exertion as needed (has not needed during the day).  - Continue Tyvaso, macitentan, Adcirca.  She is doing well on this regimen and is no longer  having trouble getting her meds. - BNP today.  - Repeat echo at followup in 3 months.  2. Chronic diastolic CHF: EF preserved on echo with severe concentric LVH and a small pericardial effusion.  It is possible that the LVH is due to years of HTN.  The cardiac MRI was not definitively suggestive of cardiac amyloidosis.  I was still concerned for amyloidosis given the appearance of the LV myocardium on echoes. SPEP/UPEP negative.  Abdominal fat pad biopsy showed no evidence for amyloidosis. PYP scan in 10/18 was not suggestive of TTR amyloidosis. Weight is down 2 lbs, NYHA class II.  - Continue torsemide 30 mg bid. BMET today.   3. HTN: BP is controlled.  4. Chronic atrial fibrillation: Continue coumadin.  She is off metoprolol due to bradycardia. Holter off metoprolol did not show any high rate episodes.  - CBC tdoay.  5. Hyperlipidemia: On Crestor, good lipids in 7/20.    Signed, [name], MD  03/09/2019  [facility] [place] [address] [facility] [place] [address] [phone] (office) [phone] (fax)

## 2019-03-10 ENCOUNTER — Telehealth (HOSPITAL_COMMUNITY): Payer: Self-pay

## 2019-03-10 NOTE — Telephone Encounter (Signed)
Pt assistance form faxed to [employer] for [employer]. Confirmation received.

## 2019-03-27 ENCOUNTER — Telehealth (HOSPITAL_COMMUNITY): Payer: Self-pay | Admitting: Pharmacy Technician

## 2019-03-27 DIAGNOSIS — I509 Heart failure, unspecified: Secondary | ICD-10-CM | POA: Diagnosis not present

## 2019-03-27 NOTE — Telephone Encounter (Signed)
Pt was approved to receive Tyvaso through the ASSIST program via [place].  ID: [id number]  Effective Dates: 03/23/2019 through 03/22/2020  [facility] Phone #: [phone]  Charlann Boxer, CPhT

## 2019-03-30 ENCOUNTER — Other Ambulatory Visit: Payer: Self-pay

## 2019-03-30 ENCOUNTER — Ambulatory Visit (INDEPENDENT_AMBULATORY_CARE_PROVIDER_SITE_OTHER): Payer: Medicare HMO | Admitting: *Deleted

## 2019-03-30 DIAGNOSIS — I480 Paroxysmal atrial fibrillation: Secondary | ICD-10-CM | POA: Diagnosis not present

## 2019-03-30 DIAGNOSIS — Z5181 Encounter for therapeutic drug level monitoring: Secondary | ICD-10-CM | POA: Diagnosis not present

## 2019-03-30 DIAGNOSIS — I4891 Unspecified atrial fibrillation: Secondary | ICD-10-CM | POA: Diagnosis not present

## 2019-03-30 LAB — POCT INR: INR: 2.2 (ref 2.0–3.0)

## 2019-03-30 NOTE — Patient Instructions (Signed)
Description    Continue taking 1 tablet every day. Recheck in 6 weeks. Call with any medications changes [phone].

## 2019-04-26 DIAGNOSIS — I509 Heart failure, unspecified: Secondary | ICD-10-CM | POA: Diagnosis not present

## 2019-05-11 ENCOUNTER — Telehealth: Payer: Self-pay | Admitting: *Deleted

## 2019-05-11 NOTE — Telephone Encounter (Addendum)
Received a call from [place] at the [facility] table downstairs and she stated the pt informed them that her son that she sees on a regular basis tested positive for COVID 19 today. She states she has no symptoms and just found out today he is Covid positive and after finding out she decided to come to her appt. Advised that she may want to get tested for Covid 19 and we recommend her calling her PCP for more recommendations. Also, advised her to call back after she shows no symptoms for 10-14 days or per her MD recommendations. She is aware that I will cancel today's appt and await for her to call back.

## 2019-05-12 ENCOUNTER — Other Ambulatory Visit: Payer: Self-pay

## 2019-05-12 DIAGNOSIS — Z20822 Contact with and (suspected) exposure to covid-19: Secondary | ICD-10-CM

## 2019-05-15 ENCOUNTER — Telehealth: Payer: Self-pay | Admitting: Internal Medicine

## 2019-05-15 LAB — NOVEL CORONAVIRUS, NAA: SARS-CoV-2, NAA: NOT DETECTED

## 2019-05-15 NOTE — Telephone Encounter (Signed)
Negative COVID results given. Patient results "NOT Detected." Caller expressed understanding. ° °

## 2019-05-27 DIAGNOSIS — I509 Heart failure, unspecified: Secondary | ICD-10-CM | POA: Diagnosis not present

## 2019-05-28 ENCOUNTER — Other Ambulatory Visit (HOSPITAL_COMMUNITY): Payer: Self-pay | Admitting: Cardiology

## 2019-05-29 NOTE — Telephone Encounter (Signed)
LMOVM for pt to call back to schedule her an appt.

## 2019-05-29 NOTE — Telephone Encounter (Signed)
Pt states she has enough Coumadin to get her to her appointment on 12/2. Will refill medication when she comes for her INR visit.

## 2019-05-31 ENCOUNTER — Ambulatory Visit (INDEPENDENT_AMBULATORY_CARE_PROVIDER_SITE_OTHER): Payer: Medicare HMO

## 2019-05-31 ENCOUNTER — Other Ambulatory Visit: Payer: Self-pay

## 2019-05-31 DIAGNOSIS — Z5181 Encounter for therapeutic drug level monitoring: Secondary | ICD-10-CM | POA: Diagnosis not present

## 2019-05-31 DIAGNOSIS — I4891 Unspecified atrial fibrillation: Secondary | ICD-10-CM

## 2019-05-31 LAB — POCT INR: INR: 3.2 — AB (ref 2.0–3.0)

## 2019-05-31 NOTE — Patient Instructions (Signed)
Description   Skip today's dosage of Coumadin, then resume same dosage 1 tablet every day. Recheck in 5 weeks. Call with any medications changes [phone].

## 2019-06-02 ENCOUNTER — Telehealth (HOSPITAL_COMMUNITY): Payer: Self-pay | Admitting: *Deleted

## 2019-06-02 DIAGNOSIS — Z961 Presence of intraocular lens: Secondary | ICD-10-CM | POA: Diagnosis not present

## 2019-06-02 DIAGNOSIS — H35371 Puckering of macula, right eye: Secondary | ICD-10-CM | POA: Diagnosis not present

## 2019-06-02 DIAGNOSIS — H25812 Combined forms of age-related cataract, left eye: Secondary | ICD-10-CM | POA: Diagnosis not present

## 2019-06-02 DIAGNOSIS — H35073 Retinal telangiectasis, bilateral: Secondary | ICD-10-CM | POA: Diagnosis not present

## 2019-06-02 DIAGNOSIS — H35351 Cystoid macular degeneration, right eye: Secondary | ICD-10-CM | POA: Diagnosis not present

## 2019-06-02 DIAGNOSIS — H35051 Retinal neovascularization, unspecified, right eye: Secondary | ICD-10-CM | POA: Diagnosis not present

## 2019-06-02 NOTE — Telephone Encounter (Signed)
Left VM offering to change office visit to virtual visit due to covid19 precautions. Requested pt return my call to determine if they will keep office visit or change to virtual visit.

## 2019-06-08 ENCOUNTER — Encounter (HOSPITAL_COMMUNITY): Payer: Self-pay | Admitting: Cardiology

## 2019-06-08 ENCOUNTER — Ambulatory Visit (HOSPITAL_BASED_OUTPATIENT_CLINIC_OR_DEPARTMENT_OTHER)
Admission: RE | Admit: 2019-06-08 | Discharge: 2019-06-08 | Disposition: A | Discharge status: Home / Self Care | Payer: Medicare HMO | Source: Ambulatory Visit | Attending: Cardiology | Admitting: Cardiology

## 2019-06-08 ENCOUNTER — Ambulatory Visit (HOSPITAL_COMMUNITY)
Admission: RE | Admit: 2019-06-08 | Discharge: 2019-06-08 | Disposition: A | Discharge status: Home / Self Care | Payer: Medicare HMO | Source: Ambulatory Visit | Attending: Cardiology | Admitting: Cardiology

## 2019-06-08 ENCOUNTER — Other Ambulatory Visit: Payer: Self-pay

## 2019-06-08 VITALS — BP 131/61 | HR 81 | Wt 165.6 lb

## 2019-06-08 DIAGNOSIS — I482 Chronic atrial fibrillation, unspecified: Secondary | ICD-10-CM | POA: Insufficient documentation

## 2019-06-08 DIAGNOSIS — I272 Pulmonary hypertension, unspecified: Secondary | ICD-10-CM

## 2019-06-08 DIAGNOSIS — E785 Hyperlipidemia, unspecified: Secondary | ICD-10-CM | POA: Diagnosis not present

## 2019-06-08 DIAGNOSIS — I5032 Chronic diastolic (congestive) heart failure: Secondary | ICD-10-CM | POA: Diagnosis not present

## 2019-06-08 DIAGNOSIS — Z79899 Other long term (current) drug therapy: Secondary | ICD-10-CM | POA: Diagnosis not present

## 2019-06-08 DIAGNOSIS — I110 Hypertensive heart disease with heart failure: Secondary | ICD-10-CM | POA: Diagnosis not present

## 2019-06-08 DIAGNOSIS — Z7901 Long term (current) use of anticoagulants: Secondary | ICD-10-CM | POA: Diagnosis not present

## 2019-06-08 DIAGNOSIS — I2721 Secondary pulmonary arterial hypertension: Secondary | ICD-10-CM | POA: Insufficient documentation

## 2019-06-08 DIAGNOSIS — I313 Pericardial effusion (noninflammatory): Secondary | ICD-10-CM | POA: Diagnosis not present

## 2019-06-08 DIAGNOSIS — Z8249 Family history of ischemic heart disease and other diseases of the circulatory system: Secondary | ICD-10-CM | POA: Insufficient documentation

## 2019-06-08 LAB — BASIC METABOLIC PANEL
Anion gap: 11 (ref 5–15)
BUN: 16 mg/dL (ref 8–23)
CO2: 27 mmol/L (ref 22–32)
Calcium: 9.4 mg/dL (ref 8.9–10.3)
Chloride: 104 mmol/L (ref 98–111)
Creatinine, Ser: 1.11 mg/dL — ABNORMAL HIGH (ref 0.44–1.00)
GFR calc Af Amer: 55 mL/min — ABNORMAL LOW (ref >60)
GFR calc non Af Amer: 48 mL/min — ABNORMAL LOW (ref >60)
Glucose, Bld: 93 mg/dL (ref 70–99)
Potassium: 3.9 mmol/L (ref 3.5–5.1)
Sodium: 142 mmol/L (ref 135–145)

## 2019-06-08 LAB — BRAIN NATRIURETIC PEPTIDE: B Natriuretic Peptide: 649.7 pg/mL — ABNORMAL HIGH (ref 0.0–100.0)

## 2019-06-08 NOTE — Patient Instructions (Addendum)
Labs today We will only contact you if something comes back abnormal or we need to make some changes. Otherwise no news is good news!  You have been ordered a PYP Scan.  This is done in the Radiology Department of [facility].  When you come for this test please plan to be there 2-3 hours. You will receive a call from radiology to schedule this appointment.   Your physician recommends that you schedule a follow-up appointment in:  3 months with Dr Aundra Dubin  At the [facility], you and your health needs are our priority. As part of our continuing mission to provide you with exceptional heart care, we have created designated Provider Care Teams. These Care Teams include your primary Cardiologist (physician) and Advanced Practice Providers (APPs- Physician Assistants and Nurse Practitioners) who all work together to provide you with the care you need, when you need it.   You may see any of the following providers on your designated Care Team at your next follow up: Marland Kitchen Dr Glori Bickers . Dr [name] . Darrick Grinder, NP . Lyda Jester, PA . [name], PharmD   Please be sure to bring in all your medications bottles to every appointment.

## 2019-06-08 NOTE — Progress Notes (Signed)
  Echocardiogram 2D Echocardiogram has been performed.  Vicki [name] 06/08/2019, 2:47 PM

## 2019-06-08 NOTE — Progress Notes (Signed)
6 minute walk completed.  Pt ambulated 800 feet (243 meters).  HR range 65-117.  O2 range 94-96% on 4 L portable oxygen. Tolerated well

## 2019-06-10 NOTE — Progress Notes (Signed)
Date:  06/10/2019   ID:  Vicki [name], DOB Apr 10, 1941, MRN [id number]   Provider location: [facility] Type of Visit: Established patient   PCP:  Deland Pretty, MD  Cardiologist:  Dr. Aundra Dubin   History of Present Illness: Vicki [name] is a 78 y.o. female who has a history of chronic diastolic CHF, pulmonary hypertension and chronic atrial fibrillation.  Patient was followed by Dr. Glade Lloyd in the past for chronic atrial fibrillation.  She has been on coumadin.  She reports progressive exertional dyspnea since 2011.  This gradually worsened and became quite significant over the last few months.  She used to have significant HTN, but more recently her BP has been on the lower side.  Echo was done in 2/14, showing severe concentric LVH with EF 55-60%, moderately dilated RV, moderate to severe TR, and PA systolic pressure 86 mmHg.  I did a right heart cath in 5/14.  This showed PA pressure 104/36 with PCWP 20, suggesting pulmonary arterial HTN well out of proportion to the mildly elevate wedge pressure.  She was already on amlodipine so I did not do vasodilator testing.  V/Q scan was done, showing no evidence for chronic PEs.  PFTs showed a restrictive defect. Cardiac MRI did not show definite evidence for amyloid.  I started her on macitentan 10 mg daily.  Initially, she felt better on macitentan.  However, she was admitted in 5/14 from her sleep study due to orthopnea and dyspnea.  She was diuresed for several days and diuretic was switched over to torsemide.  I next started her on tadalafil 20 mg daily and titrated up to 40 mg daily.  She thinks that this helped.  She saw pulmonary after chest CT (showed mosaic attenuation in lungs).  This was thought to be due to air-trapping rather than ILD.  She was started on Spiriva. She wears oxygen at home.   She had an echocardiogram in 2/15 that showed severe LVH, EF 75%, small pericardial effusion, mildly dilated RV with mildly  decreased systolic function, PA systolic pressure 84 mmHg.  She is not totally sure if she was taking macitentan and tadalafil at that time.  She has had a hard month.  She ran out of her macitentan and tadalafil and her insurance company refused to refill them.  After this, she took a decided turn for the worse.  [place] in the fall.  She became much more short of breath, just with walking around the house. She developed lightheaded spells, especially with micturation.  Given lightheadedness and presyncope, she was admitted last week.  She was restarted on her medications and she finally got back on her meds at home.  In the hospital, she was noted to be bradycardic with HR to 30s so metoprolol was stopped.  Of note, her abdominal fat pad biopsy did not suggest amyloidosis. Repeat RHC in 3/15 still with moderate to severe PAH and low CI.  Holter off beta blocker in 3/15 showed average HR 73.   Echo in 9/17 showed EF 65-70% with moderate to severe LVH, moderate MR, normal RV size with mildly decreased systolic function, PASP 56 mmHg, small pericardial effusion.   Echo in 10/18 showed EF 60-65% with moderate LVH, moderate MR, severe biatrial enlargement, PASP 62 mmHg, RV normal.  PYP scan in 10/18 was not suggestive of TTR amyloidosis.   Echo in 10/19  showed EF 65-70%, moderate MR, normal RV size and systolic function, PASP 43 mmHg.   Echo was done today and reviewed, EF 65-70%, moderate LVH, severe biatrial enlargement, mild-moderate MR, small pericardial effusion, unable to estimate PA systolic pressure.   She returns for followup of CHF and pulmonary hypertension. On Tyvaso, tadalafil, and macitentan for pulmonary hypertension, able to get all meds without problems. Weight down 1 lb.  Breathing is stable, no dyspnea walking on flat ground.  +Bendopnea.  No chest pain. No lightheadedness.       6 minute walk (5/14): 122 m.     6 minute walk (7/14): 152 m 6 minute walk (10/14): 183 m 6 minute walk (4/15): 317 m 6 minute walk (12/15): 259 m 6 minute walk (4/16): 293 m 6 minute walk (8/16): 341 m 6 minute walk (12/16): 274 m 6 minute walk (6/17): 314 m 6 minute walk (9/17): 307 m 6 minute walk (4/18): 366 m 6 minute walk (10/18): 320 m 6 minute walk (3/19): 381 m 6 minute walk (9/19): 366 m 6 minute walk (9/20): 305 m 6 minute walk (12/20): 243 m  Labs (12/13): HCT 45.1, plts 153, K 3.5, creatinine 1.0 Labs (1/14): BNP 849 Labs (4/14): K 3.1, creatinine 1.0, ANA and anti-SCL-70 antibody negative.  Rheumatoid factor negative.  Serum immunofixation did not show monoclonal light chains.  Labs (5/14): K 3.3, creatinine 0.79, proBNP 5147 Labs (6/14): K 4, creatinine 0.9, BNP 1001 Labs (10/14): LDL 53, LDL-P 975, K 3.7, creatinine 1.2 Labs (11/14): K 3.7, creatinine 0.9, BNP 832 Labs (2/15): K 3.5, creatinine 1.10, HCT 42.6, UPEP negative, SPEP negative.  Labs (3/15): K 3.8, creatinine 0.88 Labs (4/15): K 3.7, creatinine 0.99, proBNP 1653 Labs (7/15): K 4, creatinine 0.93 Labs (12/15): LDL 77, HDL 58, K 3.9, creatinine 1.03, LFTs normal Labs (4/16): K 3.8, creatinine 0.93, BNP 475, plts 105, HCT 42.3 Labs (11/16): K 3.6, creatinine 1.1, HCT 42.2 Labs (12/16): LDL 54, HDL 50, BNP 628 Labs (6/17): K 3.7, creatinine 0.97, HCT 41.3, BNP 489 Labs (9/17): K 3.8, creatinine 0.97, LDL 65, HDL 54 Labs (9/18): K 4.1, creatinine 1.05, hgb 14.6, BNP 646 Labs (10/18): BNP 588, TSH normal, LDL 73, HDL 48, K 3.5, creatinine 0.97, hgb 12.5, plts 91, LFTs normal Labs (3/19): K 4.1, creatinine 1.07, hgb 13.2 Labs (9/19): K 3.8, creatinine 1.1 Labs (7/20): K 3.9, creatinine 1.3, LDL 63 Labs (9/20): K 4.2, creatinine 1.05, hgb 12.6, BNP 632  PMH: 1. Chronic diastolic CHF: Echo (6/22) with EF 55-60%, severe LVH (no SAM, no asymmetric hypertrophy, no LVOT gradient), moderate-severe LAE, moderately dilated RV with mildly  decreased systolic function, moderate to severe RAE, PA systolic pressure 86 mmHg, moderate-severe TR, moderate MR, trivial pericardial effusion.  Cardiac MRI (5/14): EF 65%, severe LVH, no definite evidence for amyloidosis (no delayed enhancement, myocardium not difficult to null).  Echo (2/15) with EF 75%, severe LVH, grade II diastolic dysfunction, moderate MR, RV mildly dilated with mildly decreased systolic function, moderate TR, PA systolic pressure 84 mmHg, small pericardial effusion. Abdominal fat pad biopsy (2/15) showed no evidence for amyloidosis.  SPEP/UPEP negative 2/15.  - Echo (4/16) with EF 60-65%, severe LVH, RV mildly dilated with mildly decreased systolic function, PA systolic pressure 40 mmHg. - Echo (9/17) with EF 65-70%, severe LVH, moderate MR, normal RV size with mildly decreased systolic function, PASP 56 mmHg, small pericardial effusion.  - Echo (10/18): EF 60-65% with moderate LVH, moderate MR, severe biatrial enlargement, PASP 62  mmHg, RV normal. - PYP scan (10/18) was not suggestive of TTR amyloidosis.  - Echo (10/19): EF 65-70%, moderate MR, normal RV size and systolic function, PASP 43 mmHg. - Echo (12/20): EF 65-70%, moderate LVH, severe biatrial enlargement, mild-moderate MR, small pericardial effusion, unable to estimate PA systolic pressure.  2. Chronic atrial fibrillation since around 2004.  Developed bradycardia and metoprolol stopped 2/15. Holter (3/15) with average HR 73, atrial fibrillation, 3.8 sec pause x 1 while asleep, PVCs.  3. HTN: For decades.  4. LHC (2/08) with no significant disease.  5. Chronic thrombocytopenia: ITP 6. Pulmonary arterial HTN: RHC (5/14) with mean RA 13, PA 104/36 (mean 63), mean PCWP 20 on right and 23 on left, CI 2.3 (Fick) and 1.6 (thermo), PVR 10.4 WU (Fick) and 15 WU (thermo).  Vasodilator testing not done as patient was already on amlodipine.  V/Q scan (5/14) with no evidence for chronic PE.  ANA, RF, and anti-SCL70 antibody  negative.  PFTs (5/14) with FEV1 60%, FVC 54%, ratio 112%, TLC 61%, DLCO 43% => restrictive defect.  Sleep study (7/14) with no OSA.  CT chest with areas of mosaic attenuation in lungs (saw pulmonary, thought air trapping and not ILD). RHC (3/15) with RA mean 5, PA 66/31 mean 43, PCWP mean 11, Cardiac Index (Fick) 1.97, PVR 9.2 WU.  Patient was started on Tyvaso in 3/15.  Echo (4/16) with mildly dilated and mildly dysfunctional RV, PA systolic pressure 40 mmHg.   Current Outpatient Medications  Medication Sig Dispense Refill  . acetaminophen (TYLENOL) 500 MG tablet Take 500 mg by mouth every 6 (six) hours as needed for mild pain or headache.     Marland Kitchen amLODipine (NORVASC) 2.5 MG tablet TAKE ONE TABLET BY MOUTH ONCE DAILY 30 tablet 0  . Cholecalciferol (VITAMIN D3) 2000 units TABS Take 1 tablet by mouth daily.     Marland Kitchen KLOR-CON M20 20 MEQ tablet TAKE TWO TABLETS BY MOUTH IN THE MORNING AND ONE IN THE EVENING 90 tablet 0  . OPSUMIT 10 MG tablet TAKE 1 TABLET (10 MG TOTAL) BY MOUTH DAILY WITH BREAKFAST. 30 tablet 11  . rosuvastatin (CRESTOR) 20 MG tablet Take 20 mg by mouth at bedtime.     . tadalafil, PAH, (ADCIRCA) 20 MG tablet Take 2 tablets (40 mg total) by mouth daily. 180 tablet 2  . torsemide (DEMADEX) 20 MG tablet Take 1.5 tablets (30 mg total) by mouth 2 (two) times daily. 90 tablet 6  . Treprostinil (TYVASO) 0.6 MG/ML SOLN Inhale 54 mcg into the lungs 4 (four) times daily. 30 mL 5  . warfarin (COUMADIN) 5 MG tablet TAKE  AS DIRECTED BY THE COUMADIN  CLINIC. 90 tablet 0   No current facility-administered medications for this encounter.    Allergies:   Patient has no known allergies.   Social History:  The patient  reports that she has never smoked. She has never used smokeless tobacco. She reports current alcohol use of about 3.0 standard drinks of alcohol per week. She reports that she does not use drugs.   Family History:  The patient's family history includes Alzheimer's disease in her  mother; Heart Problems in her brother and maternal aunt; Heart attack in her father; Hypertension in her brother; Pulmonary Hypertension in her cousin; Thyroid disease in her daughter, daughter, and maternal aunt.   ROS:  Please see the history of present illness.   All other systems are personally reviewed and negative.   Exam:   BP 131/61  Pulse 81   Wt 75.1 kg (165 lb 9.6 oz)   SpO2 97%   BMI 30.29 kg/m  General: NAD Neck: No JVD, no thyromegaly or thyroid nodule.  Lungs: Clear to auscultation bilaterally with normal respiratory effort. CV: Nondisplaced PMI.  Heart irregular S1/S2, no S3/S4, 2/6 SEM RUSB.  No peripheral edema.  No carotid bruit.  Normal pedal pulses.  Abdomen: Soft, nontender, no hepatosplenomegaly, no distention.  Skin: Intact without lesions or rashes.  Neurologic: Alert and oriented x 3.  Psych: Normal affect. Extremities: No clubbing or cyanosis.  HEENT: Normal.   Recent Labs: 03/08/2019: Hemoglobin 12.5; Platelets 113 06/08/2019: B Natriuretic Peptide 649.7; BUN 16; Creatinine, Ser 1.11; Potassium 3.9; Sodium 142  Personally reviewed   Wt Readings from Last 3 Encounters:  06/08/19 75.1 kg (165 lb 9.6 oz)  03/08/19 75.5 kg (166 lb 6.4 oz)  03/10/18 76.3 kg (168 lb 3.2 oz)    ASSESSMENT AND PLAN:  1. Pulmonary HTN: Patient has severe pulmonary arterial HTN.  Last RHC in 3/15 showed CI 1.97, PVR 9.  It is possible that long-standing PCWP elevation from diastolic LV dysfunction could lead to pulmonary vascular changes with pulmonary arterial hypertension out of proportion to the PCWP elevation.  However, suspect idiopathic primary pulmonary HTN (Group 1). Collagen vascular disease workup was negative (negative RF, ANA and negative anti-SCL-70).  V/Q scan was not suggestive of chronic PEs.  PFTs were suggestive of restrictive lung disease but CT chest and evaluation by pulmonary did not suggest interstitial lung disease.  Sleep study did not show OSA.  Last echo  in 12/20 showed normal RV size and systolic function, unable to estimate PA systolic pressure.  She is stable symptomatically though 6 minute walk is less today.      - Continue oxygen at night and with exertion as needed (has not needed during the day).  - Continue Tyvaso, macitentan, Adcirca.  She is doing well on this regimen and is no longer having trouble getting her meds. - BNP today.  2. Chronic diastolic CHF: EF preserved on echo with moderate concentric LVH and a small pericardial effusion.  It is possible that the LVH is due to years of HTN.  The cardiac MRI was not definitively suggestive of cardiac amyloidosis.  I was still concerned for amyloidosis given the appearance of the LV myocardium on echoes. SPEP/UPEP negative.  Abdominal fat pad biopsy showed no evidence for amyloidosis. PYP scan in 10/18 was not suggestive of TTR amyloidosis. NYHA class II symptoms.  - I am going to repeat the PYP scan due to significant suspension for amyloidosis based on echo.  - Continue torsemide 30 mg bid. BMET today.   3. HTN: BP is controlled.  4. Chronic atrial fibrillation: Continue coumadin.  She is off metoprolol due to bradycardia. Holter off metoprolol did not show any high rate episodes.  5. Hyperlipidemia: On Crestor, good lipids in 7/20.   Followup in 3 months.    Signed, [name], MD  06/10/2019  [facility] [address] [facility] [place] [address] [phone] (office) [phone] (fax)

## 2019-06-12 LAB — MULTIPLE MYELOMA PANEL, SERUM
Albumin SerPl Elph-Mcnc: 3.6 g/dL (ref 2.9–4.4)
Albumin/Glob SerPl: 1 (ref 0.7–1.7)
Alpha 1: 0.2 g/dL (ref 0.0–0.4)
Alpha2 Glob SerPl Elph-Mcnc: 0.8 g/dL (ref 0.4–1.0)
B-Globulin SerPl Elph-Mcnc: 1.2 g/dL (ref 0.7–1.3)
Gamma Glob SerPl Elph-Mcnc: 1.5 g/dL (ref 0.4–1.8)
Globulin, Total: 3.7 g/dL (ref 2.2–3.9)
IgA: 331 mg/dL (ref 64–422)
IgG (Immunoglobin G), Serum: 1666 mg/dL — ABNORMAL HIGH (ref 586–1602)
IgM (Immunoglobulin M), Srm: 96 mg/dL (ref 26–217)
Total Protein ELP: 7.3 g/dL (ref 6.0–8.5)

## 2019-06-12 LAB — IMMUNOFIXATION, URINE

## 2019-06-14 ENCOUNTER — Telehealth (HOSPITAL_COMMUNITY): Payer: Self-pay | Admitting: Vascular Surgery

## 2019-06-14 NOTE — Telephone Encounter (Signed)
Left pt detailed message giving pt PYP scan appt 06/26/19@ 11 am, asked pt to call back to confirm appt

## 2019-06-26 ENCOUNTER — Encounter (HOSPITAL_COMMUNITY)
Admission: RE | Admit: 2019-06-26 | Discharge: 2019-06-26 | Disposition: A | Discharge status: Home / Self Care | Payer: Medicare HMO | Source: Ambulatory Visit | Attending: Cardiology | Admitting: Cardiology

## 2019-06-26 ENCOUNTER — Other Ambulatory Visit: Payer: Self-pay

## 2019-06-26 DIAGNOSIS — I509 Heart failure, unspecified: Secondary | ICD-10-CM | POA: Diagnosis not present

## 2019-06-26 DIAGNOSIS — I5032 Chronic diastolic (congestive) heart failure: Secondary | ICD-10-CM | POA: Insufficient documentation

## 2019-06-26 MED ORDER — TECHNETIUM TC 99M PYROPHOSPHATE
21.0000 | Freq: Once | INTRAVENOUS | Status: AC | PRN
  Administered 2019-06-26: 21 via INTRAVENOUS
  Filled 2019-06-26: qty 21

## 2019-06-27 ENCOUNTER — Telehealth (HOSPITAL_COMMUNITY): Payer: Self-pay | Admitting: Pharmacy Technician

## 2019-06-27 NOTE — Telephone Encounter (Signed)
Patient Advocate Encounter   Received notification from [facility] that prior authorization for Opsumit is required.   PA submitted via fax 12/28. Follow up questions faxed 12/29. Status is pending   Will continue to follow.  Charlann Boxer, CPhT

## 2019-06-27 NOTE — Telephone Encounter (Signed)
[facility] Patient Advocate Encounter  Prior Authorization for Opsumit has been approved.     Effective dates: 06/29/2018 through 06/28/2020  Charlann Boxer, CPhT

## 2019-07-05 ENCOUNTER — Ambulatory Visit (INDEPENDENT_AMBULATORY_CARE_PROVIDER_SITE_OTHER): Payer: Medicare HMO | Admitting: *Deleted

## 2019-07-05 ENCOUNTER — Other Ambulatory Visit: Payer: Self-pay

## 2019-07-05 DIAGNOSIS — I4891 Unspecified atrial fibrillation: Secondary | ICD-10-CM | POA: Diagnosis not present

## 2019-07-05 DIAGNOSIS — Z5181 Encounter for therapeutic drug level monitoring: Secondary | ICD-10-CM

## 2019-07-05 LAB — POCT INR: INR: 2.4 (ref 2.0–3.0)

## 2019-07-05 NOTE — Patient Instructions (Signed)
Description    Continue taking 1 tablet every day. Recheck in 6 weeks. Call with any medications changes [phone].    

## 2019-07-10 DIAGNOSIS — I10 Essential (primary) hypertension: Secondary | ICD-10-CM | POA: Diagnosis not present

## 2019-07-10 DIAGNOSIS — E7800 Pure hypercholesterolemia, unspecified: Secondary | ICD-10-CM | POA: Diagnosis not present

## 2019-07-10 DIAGNOSIS — E789 Disorder of lipoprotein metabolism, unspecified: Secondary | ICD-10-CM | POA: Diagnosis not present

## 2019-07-10 DIAGNOSIS — Z0000 Encounter for general adult medical examination without abnormal findings: Secondary | ICD-10-CM | POA: Diagnosis not present

## 2019-07-13 DIAGNOSIS — I10 Essential (primary) hypertension: Secondary | ICD-10-CM | POA: Diagnosis not present

## 2019-07-13 DIAGNOSIS — I4891 Unspecified atrial fibrillation: Secondary | ICD-10-CM | POA: Diagnosis not present

## 2019-07-13 DIAGNOSIS — Z0000 Encounter for general adult medical examination without abnormal findings: Secondary | ICD-10-CM | POA: Diagnosis not present

## 2019-07-13 DIAGNOSIS — E7800 Pure hypercholesterolemia, unspecified: Secondary | ICD-10-CM | POA: Diagnosis not present

## 2019-07-18 ENCOUNTER — Telehealth (HOSPITAL_COMMUNITY): Payer: Self-pay | Admitting: Pharmacist

## 2019-07-18 ENCOUNTER — Other Ambulatory Visit (HOSPITAL_COMMUNITY): Payer: Self-pay | Admitting: Pharmacist

## 2019-07-18 DIAGNOSIS — I4891 Unspecified atrial fibrillation: Secondary | ICD-10-CM

## 2019-07-18 DIAGNOSIS — Z9861 Coronary angioplasty status: Secondary | ICD-10-CM

## 2019-07-18 NOTE — Progress Notes (Signed)
Entered in error

## 2019-07-18 NOTE — Telephone Encounter (Signed)
Patient Advocate Encounter   Received notification from [facility] that prior authorization for Tadalafil is required.   PA submitted via fax Status is pending   Will continue to follow.  [name], PharmD, BCPS, BCCP, CPP [facility] Pharmacist [phone]

## 2019-07-23 ENCOUNTER — Ambulatory Visit: Payer: Medicare HMO | Attending: Internal Medicine

## 2019-07-23 DIAGNOSIS — Z23 Encounter for immunization: Secondary | ICD-10-CM

## 2019-07-23 NOTE — Progress Notes (Signed)
   Covid-19 Vaccination Clinic  Name:  YANET BALLIET    MRN: [id number] DOB: 09-09-1940  07/23/2019  Ms. Pech was observed post Covid-19 immunization for 15 minutes without incidence. She was provided with Vaccine Information Sheet and instruction to access the V-Safe system.   Ms. Latouche was instructed to call 911 with any severe reactions post vaccine: Marland Kitchen Difficulty breathing  . Swelling of your face and throat  . A fast heartbeat  . A bad rash all over your body  . Dizziness and weakness    Immunizations Administered    Name Date Dose VIS Date Route   Pfizer COVID-19 Vaccine 07/23/2019 10:21 AM 0.3 mL 06/09/2019 Intramuscular   Manufacturer: [place]   Lot: [id number]   [place]: [id number]

## 2019-07-27 DIAGNOSIS — I509 Heart failure, unspecified: Secondary | ICD-10-CM | POA: Diagnosis not present

## 2019-08-02 ENCOUNTER — Telehealth (HOSPITAL_COMMUNITY): Payer: Self-pay | Admitting: Pharmacy Technician

## 2019-08-02 NOTE — Telephone Encounter (Signed)
Spoke with [employer] about the hold on patient's grant. They need patient to fill out a Household Size and Income Worksheet and send proof of income. Will mail her the worksheet and she will send it back. Foundation stated they also sent her the sheet to fill in, she has yet to receive it.  Will follow up.  Charlann Boxer, CPhT

## 2019-08-04 NOTE — Telephone Encounter (Signed)
Patient received paperwork. Walked her through what was needed. She is going to fill it in and send it back to [facility] [employer].  Will follow up.  Charlann Boxer, CPhT

## 2019-08-11 ENCOUNTER — Telehealth (HOSPITAL_COMMUNITY): Payer: Self-pay | Admitting: Pharmacist

## 2019-08-11 NOTE — Telephone Encounter (Signed)
Received income documentation via mail from patient. Faxed in documents to the [employer]. Grant for Pulmonary Hypertension is on hold until income documents are processed.   Will continue to follow.   [name], PharmD, BCPS, BCCP, CPP Heart Failure Clinic Pharmacist [phone]

## 2019-08-14 ENCOUNTER — Ambulatory Visit: Payer: Medicare HMO | Attending: Internal Medicine

## 2019-08-14 DIAGNOSIS — Z23 Encounter for immunization: Secondary | ICD-10-CM

## 2019-08-14 NOTE — Progress Notes (Signed)
   Covid-19 Vaccination Clinic  Name:  Vicki [name]    MRN: [id number] DOB: 10-Sep-1940  08/14/2019  [name] was observed post Covid-19 immunization for 15 minutes without incidence. She was provided with Vaccine Information Sheet and instruction to access the V-Safe system.   [name] was instructed to call 911 with any severe reactions post vaccine: Marland Kitchen Difficulty breathing  . Swelling of your face and throat  . A fast heartbeat  . A bad rash all over your body  . Dizziness and weakness    Immunizations Administered    Name Date Dose VIS Date Route   Pfizer COVID-19 Vaccine 08/14/2019  9:20 AM 0.3 mL 06/09/2019 Intramuscular   Manufacturer: [place]   Lot: [id number]   [place]: [id number]

## 2019-08-16 ENCOUNTER — Ambulatory Visit (INDEPENDENT_AMBULATORY_CARE_PROVIDER_SITE_OTHER): Payer: Medicare HMO

## 2019-08-16 ENCOUNTER — Other Ambulatory Visit: Payer: Self-pay

## 2019-08-16 DIAGNOSIS — I4891 Unspecified atrial fibrillation: Secondary | ICD-10-CM

## 2019-08-16 DIAGNOSIS — Z5181 Encounter for therapeutic drug level monitoring: Secondary | ICD-10-CM

## 2019-08-16 LAB — POCT INR: INR: 3.1 — AB (ref 2.0–3.0)

## 2019-08-16 NOTE — Patient Instructions (Signed)
Description   Skip today's dosage of Coumadin, then resume same dosage 1 tablet every day. Recheck in 6 weeks. Call with any medications changes [phone].

## 2019-08-24 NOTE — Telephone Encounter (Signed)
Patient has been approved for Pulmonary Hypertension [employer] Grant. Information provided to [facility]. Unable to leave patient VM.  Pharmacy Card ID: [id number]  Group [id number] PCN [id number]  BIN [id number] [place] Amount $10,000.00 Start Date 06/27/2019 End Date 06/25/2020  [name], PharmD, BCPS, BCCP, CPP Heart Failure Clinic Pharmacist [phone]

## 2019-08-27 DIAGNOSIS — I509 Heart failure, unspecified: Secondary | ICD-10-CM | POA: Diagnosis not present

## 2019-08-28 ENCOUNTER — Other Ambulatory Visit (HOSPITAL_COMMUNITY): Payer: Self-pay | Admitting: Cardiology

## 2019-08-28 NOTE — Telephone Encounter (Signed)
Followed by the coumadin clinic

## 2019-08-29 ENCOUNTER — Other Ambulatory Visit (HOSPITAL_COMMUNITY): Payer: Self-pay

## 2019-08-29 DIAGNOSIS — Z9861 Coronary angioplasty status: Secondary | ICD-10-CM

## 2019-08-29 DIAGNOSIS — I4891 Unspecified atrial fibrillation: Secondary | ICD-10-CM

## 2019-08-29 MED ORDER — OPSUMIT 10 MG PO TABS: ORAL_TABLET | ORAL | 3 refills | Status: DC

## 2019-09-06 ENCOUNTER — Telehealth (HOSPITAL_COMMUNITY): Payer: Self-pay

## 2019-09-06 NOTE — Telephone Encounter (Signed)

## 2019-09-07 ENCOUNTER — Other Ambulatory Visit: Payer: Self-pay

## 2019-09-07 ENCOUNTER — Ambulatory Visit (HOSPITAL_COMMUNITY)
Admission: RE | Admit: 2019-09-07 | Discharge: 2019-09-07 | Disposition: A | Discharge status: Home / Self Care | Payer: Medicare HMO | Source: Ambulatory Visit | Attending: Cardiology | Admitting: Cardiology

## 2019-09-07 ENCOUNTER — Encounter (HOSPITAL_COMMUNITY): Payer: Self-pay | Admitting: Cardiology

## 2019-09-07 ENCOUNTER — Encounter: Payer: Self-pay | Admitting: Cardiology

## 2019-09-07 VITALS — BP 108/60 | HR 65 | Wt 167.4 lb

## 2019-09-07 DIAGNOSIS — Z9981 Dependence on supplemental oxygen: Secondary | ICD-10-CM | POA: Diagnosis not present

## 2019-09-07 DIAGNOSIS — Z7901 Long term (current) use of anticoagulants: Secondary | ICD-10-CM | POA: Insufficient documentation

## 2019-09-07 DIAGNOSIS — I482 Chronic atrial fibrillation, unspecified: Secondary | ICD-10-CM | POA: Insufficient documentation

## 2019-09-07 DIAGNOSIS — I313 Pericardial effusion (noninflammatory): Secondary | ICD-10-CM | POA: Insufficient documentation

## 2019-09-07 DIAGNOSIS — I2721 Secondary pulmonary arterial hypertension: Secondary | ICD-10-CM | POA: Insufficient documentation

## 2019-09-07 DIAGNOSIS — Z8349 Family history of other endocrine, nutritional and metabolic diseases: Secondary | ICD-10-CM | POA: Insufficient documentation

## 2019-09-07 DIAGNOSIS — Z79899 Other long term (current) drug therapy: Secondary | ICD-10-CM | POA: Diagnosis not present

## 2019-09-07 DIAGNOSIS — Z820 Family history of epilepsy and other diseases of the nervous system: Secondary | ICD-10-CM | POA: Diagnosis not present

## 2019-09-07 DIAGNOSIS — E785 Hyperlipidemia, unspecified: Secondary | ICD-10-CM | POA: Insufficient documentation

## 2019-09-07 DIAGNOSIS — I5032 Chronic diastolic (congestive) heart failure: Secondary | ICD-10-CM | POA: Diagnosis not present

## 2019-09-07 DIAGNOSIS — I110 Hypertensive heart disease with heart failure: Secondary | ICD-10-CM | POA: Insufficient documentation

## 2019-09-07 DIAGNOSIS — Z8249 Family history of ischemic heart disease and other diseases of the circulatory system: Secondary | ICD-10-CM | POA: Diagnosis not present

## 2019-09-07 LAB — BASIC METABOLIC PANEL
Anion gap: 9 (ref 5–15)
BUN: 16 mg/dL (ref 8–23)
CO2: 28 mmol/L (ref 22–32)
Calcium: 9.2 mg/dL (ref 8.9–10.3)
Chloride: 105 mmol/L (ref 98–111)
Creatinine, Ser: 1.13 mg/dL — ABNORMAL HIGH (ref 0.44–1.00)
GFR calc Af Amer: 54 mL/min — ABNORMAL LOW (ref >60)
GFR calc non Af Amer: 47 mL/min — ABNORMAL LOW (ref >60)
Glucose, Bld: 99 mg/dL (ref 70–99)
Potassium: 3.8 mmol/L (ref 3.5–5.1)
Sodium: 142 mmol/L (ref 135–145)

## 2019-09-07 NOTE — Patient Instructions (Signed)
Genetic test has been done, this has to be sent to [place] to be processed and can take 1-2 weeks to get results back.  We will let you know the results.  Labs today We will only contact you if something comes back abnormal or we need to make some changes. Otherwise no news is good news!  Your physician recommends that you schedule a follow-up appointment in: 2 months with Dr Aundra Dubin  Please call office at [phone] option 2 if you have any questions or concerns.   At the [facility], you and your health needs are our priority. As part of our continuing mission to provide you with exceptional heart care, we have created designated Provider Care Teams. These Care Teams include your primary Cardiologist (physician) and Advanced Practice Providers (APPs- Physician Assistants and Nurse Practitioners) who all work together to provide you with the care you need, when you need it.   You may see any of the following providers on your designated Care Team at your next follow up: Marland Kitchen Dr Glori Bickers . Dr [name] . Darrick Grinder, NP . Lyda Jester, PA . [name], PharmD   Please be sure to bring in all your medications bottles to every appointment.

## 2019-09-10 NOTE — Progress Notes (Signed)
Date:  09/10/2019   ID:  Vicki [name], DOB 23-Feb-1941, MRN [id number]   Provider location: [facility] Type of Visit: Established patient   PCP:  Deland Pretty, MD  Cardiologist:  Dr. Aundra Dubin   History of Present Illness: Vicki [name] is a 79 y.o. female who has a history of chronic diastolic CHF, pulmonary hypertension and chronic atrial fibrillation.  Patient was followed by Dr. Glade Lloyd in the past for chronic atrial fibrillation.  She has been on coumadin.  She reports progressive exertional dyspnea since 2011.  This gradually worsened and became quite significant over the last few months.  She used to have significant HTN, but more recently her BP has been on the lower side.  Echo was done in 2/14, showing severe concentric LVH with EF 55-60%, moderately dilated RV, moderate to severe TR, and PA systolic pressure 86 mmHg.  I did a right heart cath in 5/14.  This showed PA pressure 104/36 with PCWP 20, suggesting pulmonary arterial HTN well out of proportion to the mildly elevate wedge pressure.  She was already on amlodipine so I did not do vasodilator testing.  V/Q scan was done, showing no evidence for chronic PEs.  PFTs showed a restrictive defect. Cardiac MRI did not show definite evidence for amyloid.  I started her on macitentan 10 mg daily.  Initially, she felt better on macitentan.  However, she was admitted in 5/14 from her sleep study due to orthopnea and dyspnea.  She was diuresed for several days and diuretic was switched over to torsemide.  I next started her on tadalafil 20 mg daily and titrated up to 40 mg daily.  She thinks that this helped.  She saw pulmonary after chest CT (showed mosaic attenuation in lungs).  This was thought to be due to air-trapping rather than ILD.  She was started on Spiriva. She wears oxygen at home.   She had an echocardiogram in 2/15 that showed severe LVH, EF 75%, small pericardial effusion, mildly dilated RV with mildly  decreased systolic function, PA systolic pressure 84 mmHg.  She is not totally sure if she was taking macitentan and tadalafil at that time.  She has had a hard month.  She ran out of her macitentan and tadalafil and her insurance company refused to refill them.  After this, she took a decided turn for the worse.  [place] her foot in the fall.  She became much more short of breath, just with walking around the house. She developed lightheaded spells, especially with micturation.  Given lightheadedness and presyncope, she was admitted last week.  She was restarted on her medications and she finally got back on her meds at home.  In the hospital, she was noted to be bradycardic with HR to 30s so metoprolol was stopped.  Of note, her abdominal fat pad biopsy did not suggest amyloidosis. Repeat RHC in 3/15 still with moderate to severe PAH and low CI.  Holter off beta blocker in 3/15 showed average HR 73.   Echo in 9/17 showed EF 65-70% with moderate to severe LVH, moderate MR, normal RV size with mildly decreased systolic function, PASP 56 mmHg, small pericardial effusion.   Echo in 10/18 showed EF 60-65% with moderate LVH, moderate MR, severe biatrial enlargement, PASP 62 mmHg, RV normal.  PYP scan in 10/18 was not suggestive of TTR amyloidosis.   Echo in 10/19  showed EF 65-70%, moderate MR, normal RV size and systolic function, PASP 43 mmHg.   Echo in 12/20 showed EF 65-70%, moderate LVH, severe biatrial enlargement, mild-moderate MR, small pericardial effusion, unable to estimate PA systolic pressure.   PYP scan in 12/20 was visually grade 2 with H/CL 1.96 but activity appeared localized to the atria.   She returns for followup of CHF and pulmonary hypertension. On Tyvaso, tadalafil, and macitentan for pulmonary hypertension, able to get all meds without problems. Weight up 2 lbs.  Breathing is stable, uses oxygen only at night.  No  lightheadedness, no chest pain.  Gets short of breath with stairs/inclines, ok on flat ground.  She has pain in the bottom of her feet bilaterally.   ECG (personally reviewed): Atrial fibrillation, lateral TWIs, not low voltage.       6 minute walk (5/14): 122 m.   6 minute walk (7/14): 152 m 6 minute walk (10/14): 183 m 6 minute walk (4/15): 317 m 6 minute walk (12/15): 259 m 6 minute walk (4/16): 293 m 6 minute walk (8/16): 341 m 6 minute walk (12/16): 274 m 6 minute walk (6/17): 314 m 6 minute walk (9/17): 307 m 6 minute walk (4/18): 366 m 6 minute walk (10/18): 320 m 6 minute walk (3/19): 381 m 6 minute walk (9/19): 366 m 6 minute walk (9/20): 305 m 6 minute walk (12/20): 243 m  Labs (12/13): HCT 45.1, plts 153, K 3.5, creatinine 1.0 Labs (1/14): BNP 849 Labs (4/14): K 3.1, creatinine 1.0, ANA and anti-SCL-70 antibody negative.  Rheumatoid factor negative.  Serum immunofixation did not show monoclonal light chains.  Labs (5/14): K 3.3, creatinine 0.79, proBNP 5147 Labs (6/14): K 4, creatinine 0.9, BNP 1001 Labs (10/14): LDL 53, LDL-P 975, K 3.7, creatinine 1.2 Labs (11/14): K 3.7, creatinine 0.9, BNP 832 Labs (2/15): K 3.5, creatinine 1.10, HCT 42.6, UPEP negative, SPEP negative.  Labs (3/15): K 3.8, creatinine 0.88 Labs (4/15): K 3.7, creatinine 0.99, proBNP 1653 Labs (7/15): K 4, creatinine 0.93 Labs (12/15): LDL 77, HDL 58, K 3.9, creatinine 1.03, LFTs normal Labs (4/16): K 3.8, creatinine 0.93, BNP 475, plts 105, HCT 42.3 Labs (11/16): K 3.6, creatinine 1.1, HCT 42.2 Labs (12/16): LDL 54, HDL 50, BNP 628 Labs (6/17): K 3.7, creatinine 0.97, HCT 41.3, BNP 489 Labs (9/17): K 3.8, creatinine 0.97, LDL 65, HDL 54 Labs (9/18): K 4.1, creatinine 1.05, hgb 14.6, BNP 646 Labs (10/18): BNP 588, TSH normal, LDL 73, HDL 48, K 3.5, creatinine 0.97, hgb 12.5, plts 91, LFTs normal Labs (3/19): K 4.1, creatinine 1.07, hgb 13.2 Labs (9/19): K 3.8, creatinine 1.1 Labs (7/20): K  3.9, creatinine 1.3, LDL 63 Labs (9/20): K 4.2, creatinine 1.05, hgb 12.6, BNP 632  Labs (12/20): Myeloma panel negative, urine immunofixation negative.  Labs (1/21): K 4.3, creatinine 1.12, LDL 63  PMH: 1. Chronic diastolic CHF: Echo (1/19) with EF 55-60%, severe LVH (no SAM, no asymmetric hypertrophy, no LVOT gradient), moderate-severe LAE, moderately dilated RV with mildly decreased systolic function, moderate to severe RAE, PA systolic pressure 86 mmHg, moderate-severe TR, moderate MR, trivial pericardial effusion.  Cardiac MRI (5/14): EF 65%, severe LVH, no definite evidence for amyloidosis (no delayed enhancement, myocardium not difficult to null).  Echo (2/15) with EF 75%, severe LVH, grade II diastolic dysfunction, moderate MR, RV mildly dilated with mildly decreased systolic function, moderate TR, PA systolic pressure 84 mmHg, small pericardial effusion. Abdominal fat pad biopsy (2/15) showed no evidence for amyloidosis.  SPEP/UPEP negative 2/15.  - Echo (4/16) with EF 60-65%, severe LVH, RV mildly dilated with mildly decreased systolic function, PA systolic pressure 40 mmHg. - Echo (9/17) with EF 65-70%, severe LVH, moderate MR, normal RV size with mildly decreased systolic function, PASP 56 mmHg, small pericardial effusion.  - Echo (10/18): EF 60-65% with moderate LVH, moderate MR, severe biatrial enlargement, PASP 62 mmHg, RV normal. - PYP scan (10/18) was not suggestive of TTR amyloidosis.  - Echo (10/19): EF 65-70%, moderate MR, normal RV size and systolic function, PASP 43 mmHg. - Echo (12/20): EF 65-70%, moderate LVH, severe biatrial enlargement, mild-moderate MR, small pericardial effusion, unable to estimate PA systolic pressure.  - PYP scan (12/20): grade 2 with H/CL 1.96 but activity localized to the atria.  2. Chronic atrial fibrillation since around 2004.  Developed bradycardia and metoprolol stopped 2/15. Holter (3/15) with average HR 73, atrial fibrillation, 3.8 sec pause x 1  while asleep, PVCs.  3. HTN: For decades.  4. LHC (2/08) with no significant disease.  5. Chronic thrombocytopenia: ITP 6. Pulmonary arterial HTN: RHC (5/14) with mean RA 13, PA 104/36 (mean 63), mean PCWP 20 on right and 23 on left, CI 2.3 (Fick) and 1.6 (thermo), PVR 10.4 WU (Fick) and 15 WU (thermo).  Vasodilator testing not done as patient was already on amlodipine.  V/Q scan (5/14) with no evidence for chronic PE.  ANA, RF, and anti-SCL70 antibody negative.  PFTs (5/14) with FEV1 60%, FVC 54%, ratio 112%, TLC 61%, DLCO 43% => restrictive defect.  Sleep study (7/14) with no OSA.  CT chest with areas of mosaic attenuation in lungs (saw pulmonary, thought air trapping and not ILD). RHC (3/15) with RA mean 5, PA 66/31 mean 43, PCWP mean 11, Cardiac Index (Fick) 1.97, PVR 9.2 WU.  Patient was started on Tyvaso in 3/15.  Echo (4/16) with mildly dilated and mildly dysfunctional RV, PA systolic pressure 40 mmHg.   Current Outpatient Medications  Medication Sig Dispense Refill  . acetaminophen (TYLENOL) 500 MG tablet Take 500 mg by mouth every 6 (six) hours as needed for mild pain or headache.     Marland Kitchen amLODipine (NORVASC) 2.5 MG tablet TAKE ONE TABLET BY MOUTH ONCE DAILY 30 tablet 0  . Cholecalciferol (VITAMIN D3) 2000 units TABS Take 1 tablet by mouth daily.     Marland Kitchen KLOR-CON M20 20 MEQ tablet TAKE TWO TABLETS BY MOUTH IN THE MORNING AND ONE IN THE EVENING 90 tablet 0  . macitentan (OPSUMIT) 10 MG tablet TAKE 1 TABLET (10 MG TOTAL) BY MOUTH DAILY WITH BREAKFAST. 90 tablet 3  . rosuvastatin (CRESTOR) 20 MG tablet Take 20 mg by mouth at bedtime.     . tadalafil, PAH, (ADCIRCA) 20 MG tablet Take 2 tablets (40 mg total) by mouth daily. 180 tablet 2  . torsemide (DEMADEX) 20 MG tablet Take 1.5 tablets (30 mg total) by mouth 2 (two) times daily. 90 tablet 6  . Treprostinil (TYVASO) 0.6 MG/ML SOLN Inhale 54 mcg into the lungs 4 (four) times daily. 30 mL 5  . warfarin (COUMADIN) 5 MG tablet TAKE AS DIRECTED BY   COUMADIN  CLINIC 90 tablet 1   No current facility-administered medications for this encounter.    Allergies:   Patient has no known allergies.   Social History:  The patient  reports that she has never smoked. She has never used smokeless tobacco. She reports current alcohol use of about 3.0 standard drinks of alcohol per week. She reports  that she does not use drugs.   Family History:  The patient's family history includes Alzheimer's disease in her mother; Heart Problems in her brother and maternal aunt; Heart attack in her father; Hypertension in her brother; Pulmonary Hypertension in her cousin; Thyroid disease in her daughter, daughter, and maternal aunt.   ROS:  Please see the history of present illness.   All other systems are personally reviewed and negative.   Exam:   BP 108/60   Pulse 65   Wt 75.9 kg (167 lb 6.4 oz)   SpO2 95%   BMI 30.62 kg/m   General: NAD Neck: No JVD, no thyromegaly or thyroid nodule.  Lungs: Clear to auscultation bilaterally with normal respiratory effort. CV: Nondisplaced PMI.  Heart regular S1/S2, no S3/S4, no murmur.  No peripheral edema.  No carotid bruit.  Normal pedal pulses.  Abdomen: Soft, nontender, no hepatosplenomegaly, no distention.  Skin: Intact without lesions or rashes.  Neurologic: Alert and oriented x 3.  Psych: Normal affect. Extremities: No clubbing or cyanosis.  HEENT: Normal.   Recent Labs: 03/08/2019: Hemoglobin 12.5; Platelets 113 06/08/2019: B Natriuretic Peptide 649.7 09/07/2019: BUN 16; Creatinine, Ser 1.13; Potassium 3.8; Sodium 142  Personally reviewed   Wt Readings from Last 3 Encounters:  09/07/19 75.9 kg (167 lb 6.4 oz)  06/08/19 75.1 kg (165 lb 9.6 oz)  03/08/19 75.5 kg (166 lb 6.4 oz)    ASSESSMENT AND PLAN:  1. Pulmonary HTN: Patient has severe pulmonary arterial HTN.  Last RHC in 3/15 showed CI 1.97, PVR 9.  It is possible that long-standing PCWP elevation from diastolic LV dysfunction could lead to  pulmonary vascular changes with pulmonary arterial hypertension out of proportion to the PCWP elevation.  However, suspect idiopathic primary pulmonary HTN (Group 1). Collagen vascular disease workup was negative (negative RF, ANA and negative anti-SCL-70).  V/Q scan was not suggestive of chronic PEs.  PFTs were suggestive of restrictive lung disease but CT chest and evaluation by pulmonary did not suggest interstitial lung disease.  Sleep study did not show OSA.  Last echo in 12/20 showed normal RV size and systolic function, unable to estimate PA systolic pressure.  She is stable symptomatically.      - Continue oxygen at night and with exertion as needed (has not needed during the day).  - Continue Tyvaso, macitentan, Adcirca.  She is doing well on this regimen and is no longer having trouble getting her meds. 2. Chronic diastolic CHF: EF preserved on echo with moderate concentric LVH and a small pericardial effusion.  It is possible that the LVH is due to years of HTN.  The cardiac MRI was not definitively suggestive of cardiac amyloidosis.  I was still concerned for amyloidosis given the appearance of the LV myocardium on echoes.  Abdominal fat pad biopsy in 2015 showed no evidence for amyloidosis. PYP scan in 10/18 was not suggestive of TTR amyloidosis. I assessed her again for cardiac amyloidosis this year: myeloma panel and urine immunofixation negative, PYP scan in 12/20 was grade 2 with H/CL 1.97 but activity was localized to the atria.  NYHA class II symptoms.  - I have reviewed her PYP scan with colleagues, unusual pattern with uptake localized primarily to the atria.  Have asked for review of data by the [facility].  She does not think that she could do an MRI again (very claustrophobic the first time).   - Continue torsemide 30 mg bid. BMET today.   3. HTN: BP is  controlled.  4. Chronic atrial fibrillation: Continue coumadin.  She is off metoprolol due to bradycardia. Holter off metoprolol did  not show any high rate episodes.  5. Hyperlipidemia: On Crestor, good lipids in 7/20.   Followup in 3 months.    Signed, [name], MD  09/10/2019  [facility] [address] [facility] [place] [address] [phone] (office) [phone] (fax)

## 2019-09-24 DIAGNOSIS — I509 Heart failure, unspecified: Secondary | ICD-10-CM | POA: Diagnosis not present

## 2019-09-27 ENCOUNTER — Ambulatory Visit: Payer: Medicare HMO | Admitting: *Deleted

## 2019-09-27 ENCOUNTER — Other Ambulatory Visit: Payer: Self-pay

## 2019-09-27 DIAGNOSIS — Z5181 Encounter for therapeutic drug level monitoring: Secondary | ICD-10-CM

## 2019-09-27 DIAGNOSIS — I4891 Unspecified atrial fibrillation: Secondary | ICD-10-CM | POA: Diagnosis not present

## 2019-09-27 LAB — POCT INR: INR: 2.6 (ref 2.0–3.0)

## 2019-09-27 NOTE — Patient Instructions (Signed)
Description   Continue same dosage 1 tablet every day. Recheck in 6 weeks. Call with any medications changes [phone].

## 2019-10-23 ENCOUNTER — Telehealth (HOSPITAL_COMMUNITY): Payer: Self-pay | Admitting: *Deleted

## 2019-10-23 NOTE — Telephone Encounter (Signed)
Pt called for her genetic study results. Results are negative for hATTR amyloidosis. Pt aware and thanked me for the call.

## 2019-10-25 DIAGNOSIS — I509 Heart failure, unspecified: Secondary | ICD-10-CM | POA: Diagnosis not present

## 2019-11-08 ENCOUNTER — Ambulatory Visit (INDEPENDENT_AMBULATORY_CARE_PROVIDER_SITE_OTHER): Payer: Medicare HMO | Admitting: *Deleted

## 2019-11-08 ENCOUNTER — Other Ambulatory Visit: Payer: Self-pay

## 2019-11-08 DIAGNOSIS — Z5181 Encounter for therapeutic drug level monitoring: Secondary | ICD-10-CM | POA: Diagnosis not present

## 2019-11-08 DIAGNOSIS — I4891 Unspecified atrial fibrillation: Secondary | ICD-10-CM | POA: Diagnosis not present

## 2019-11-08 LAB — POCT INR: INR: 2.5 (ref 2.0–3.0)

## 2019-11-08 NOTE — Patient Instructions (Signed)
Description   Continue same dosage 1 tablet every day. Recheck in 6 weeks. Call with any medications changes [phone].

## 2019-11-24 DIAGNOSIS — I509 Heart failure, unspecified: Secondary | ICD-10-CM | POA: Diagnosis not present

## 2019-11-27 ENCOUNTER — Other Ambulatory Visit: Payer: Self-pay

## 2019-11-27 ENCOUNTER — Emergency Department (HOSPITAL_COMMUNITY)
Admission: EM | Admit: 2019-11-27 | Discharge: 2019-11-27 | Disposition: A | Discharge status: Home / Self Care | Payer: Medicare HMO | Attending: Emergency Medicine | Admitting: Emergency Medicine

## 2019-11-27 ENCOUNTER — Encounter (HOSPITAL_COMMUNITY): Payer: Self-pay

## 2019-11-27 DIAGNOSIS — Z5321 Procedure and treatment not carried out due to patient leaving prior to being seen by health care provider: Secondary | ICD-10-CM | POA: Insufficient documentation

## 2019-11-27 DIAGNOSIS — R2241 Localized swelling, mass and lump, right lower limb: Secondary | ICD-10-CM | POA: Diagnosis not present

## 2019-11-27 NOTE — ED Triage Notes (Signed)
Pt arrives POV for eval of bump/localized swelling to R lateral leg.  Very small bump noted that is TTP, no generalized edema. Pt denies known Injury or trauma. Does take coumadin for afib.

## 2019-11-27 NOTE — ED Notes (Signed)
Pt states she would like to leave.

## 2019-12-11 DIAGNOSIS — R58 Hemorrhage, not elsewhere classified: Secondary | ICD-10-CM | POA: Diagnosis not present

## 2019-12-19 ENCOUNTER — Encounter (HOSPITAL_COMMUNITY): Payer: Self-pay | Admitting: Cardiology

## 2019-12-19 ENCOUNTER — Ambulatory Visit: Payer: Medicare HMO

## 2019-12-19 ENCOUNTER — Other Ambulatory Visit: Payer: Self-pay

## 2019-12-19 ENCOUNTER — Ambulatory Visit (HOSPITAL_COMMUNITY)
Admission: RE | Admit: 2019-12-19 | Discharge: 2019-12-19 | Disposition: A | Discharge status: Home / Self Care | Payer: Medicare HMO | Source: Ambulatory Visit | Attending: Cardiology | Admitting: Cardiology

## 2019-12-19 VITALS — BP 120/60 | HR 62 | Wt 166.8 lb

## 2019-12-19 DIAGNOSIS — I272 Pulmonary hypertension, unspecified: Secondary | ICD-10-CM | POA: Diagnosis not present

## 2019-12-19 DIAGNOSIS — Z7901 Long term (current) use of anticoagulants: Secondary | ICD-10-CM | POA: Diagnosis not present

## 2019-12-19 DIAGNOSIS — I5032 Chronic diastolic (congestive) heart failure: Secondary | ICD-10-CM | POA: Diagnosis not present

## 2019-12-19 DIAGNOSIS — Z5181 Encounter for therapeutic drug level monitoring: Secondary | ICD-10-CM | POA: Diagnosis not present

## 2019-12-19 DIAGNOSIS — Z79899 Other long term (current) drug therapy: Secondary | ICD-10-CM | POA: Diagnosis not present

## 2019-12-19 DIAGNOSIS — I4891 Unspecified atrial fibrillation: Secondary | ICD-10-CM | POA: Diagnosis not present

## 2019-12-19 DIAGNOSIS — I110 Hypertensive heart disease with heart failure: Secondary | ICD-10-CM | POA: Insufficient documentation

## 2019-12-19 DIAGNOSIS — I482 Chronic atrial fibrillation, unspecified: Secondary | ICD-10-CM | POA: Diagnosis not present

## 2019-12-19 DIAGNOSIS — I2721 Secondary pulmonary arterial hypertension: Secondary | ICD-10-CM | POA: Insufficient documentation

## 2019-12-19 DIAGNOSIS — Z8249 Family history of ischemic heart disease and other diseases of the circulatory system: Secondary | ICD-10-CM | POA: Diagnosis not present

## 2019-12-19 DIAGNOSIS — I313 Pericardial effusion (noninflammatory): Secondary | ICD-10-CM | POA: Diagnosis not present

## 2019-12-19 DIAGNOSIS — E785 Hyperlipidemia, unspecified: Secondary | ICD-10-CM | POA: Insufficient documentation

## 2019-12-19 LAB — BASIC METABOLIC PANEL
Anion gap: 11 (ref 5–15)
BUN: 17 mg/dL (ref 8–23)
CO2: 27 mmol/L (ref 22–32)
Calcium: 9.3 mg/dL (ref 8.9–10.3)
Chloride: 104 mmol/L (ref 98–111)
Creatinine, Ser: 1.16 mg/dL — ABNORMAL HIGH (ref 0.44–1.00)
GFR calc Af Amer: 52 mL/min — ABNORMAL LOW (ref >60)
GFR calc non Af Amer: 45 mL/min — ABNORMAL LOW (ref >60)
Glucose, Bld: 104 mg/dL — ABNORMAL HIGH (ref 70–99)
Potassium: 3.9 mmol/L (ref 3.5–5.1)
Sodium: 142 mmol/L (ref 135–145)

## 2019-12-19 LAB — POCT INR: INR: 2.1 (ref 2.0–3.0)

## 2019-12-19 NOTE — Patient Instructions (Signed)
Continue same dosage 1 tablet every day. Recheck in 6 weeks. Call with any medications changes [phone].

## 2019-12-19 NOTE — Progress Notes (Addendum)
6 Min Walk Test Completed  Pt ambulated 1000 ft (304 m) O2 Sat ranged 96-97% on 4L oxygen HR ranged 63-99

## 2019-12-19 NOTE — Patient Instructions (Addendum)
Labs done today, your results will be available in MyChart, we will contact you for abnormal readings.  You have been referred to Dr Mervin Hack at the [facility] clinic for a virtual appointment, they will contact you to schedule this  Please call our office in October to schedule your follow up appointment  If you have any questions or concerns before your next appointment please send [place] a message through [place] or call our office at [phone].    TO LEAVE A MESSAGE FOR THE NURSE SELECT OPTION 2, PLEASE LEAVE A MESSAGE INCLUDING: . YOUR NAME . DATE OF BIRTH . CALL BACK NUMBER . REASON FOR CALL**this is important as we prioritize the call backs  [place] AS LONG AS YOU CALL BEFORE 4:00 PM  At the [facility], you and your health needs are our priority. As part of our continuing mission to provide you with exceptional heart care, we have created designated Provider Care Teams. These Care Teams include your primary Cardiologist (physician) and Advanced Practice Providers (APPs- Physician Assistants and Nurse Practitioners) who all work together to provide you with the care you need, when you need it.   You may see any of the following providers on your designated Care Team at your next follow up: Marland Kitchen Dr Glori Bickers . Dr [name] . Darrick Grinder, NP . Lyda Jester, PA . [name], PharmD   Please be sure to bring in all your medications bottles to every appointment.

## 2019-12-20 NOTE — Progress Notes (Signed)
Date:  12/20/2019   ID:  Vicki [name], DOB 11-18-1940, MRN [id number]   Provider location: [place] [facility] Type of Visit: Established patient   PCP:  Deland Pretty, MD  Cardiologist:  Dr. Aundra Dubin   History of Present Illness: Vicki [name] is a 79 y.o. female who has a history of chronic diastolic CHF, pulmonary hypertension and chronic atrial fibrillation.  Patient was followed by Dr. Glade Lloyd in the past for chronic atrial fibrillation.  She has been on coumadin.  She reports progressive exertional dyspnea since 2011.  This gradually worsened and became quite significant over the last few months.  She used to have significant HTN, but more recently her BP has been on the lower side.  Echo was done in 2/14, showing severe concentric LVH with EF 55-60%, moderately dilated RV, moderate to severe TR, and PA systolic pressure 86 mmHg.  I did a right heart cath in 5/14.  This showed PA pressure 104/36 with PCWP 20, suggesting pulmonary arterial HTN well out of proportion to the mildly elevate wedge pressure.  She was already on amlodipine so I did not do vasodilator testing.  V/Q scan was done, showing no evidence for chronic PEs.  PFTs showed a restrictive defect. Cardiac MRI did not show definite evidence for amyloid.  I started her on macitentan 10 mg daily.  Initially, she felt better on macitentan.  However, she was admitted in 5/14 from her sleep study due to orthopnea and dyspnea.  She was diuresed for several days and diuretic was switched over to torsemide.  I next started her on tadalafil 20 mg daily and titrated up to 40 mg daily.  She thinks that this helped.  She saw pulmonary after chest CT (showed mosaic attenuation in lungs).  This was thought to be due to air-trapping rather than ILD.  She was started on Spiriva. She wears oxygen at home.   She had an echocardiogram in 2/15 that showed severe LVH, EF 75%, small pericardial effusion, mildly dilated RV with mildly  decreased systolic function, PA systolic pressure 84 mmHg.  She is not totally sure if she was taking macitentan and tadalafil at that time.  She has had a hard month.  She ran out of her macitentan and tadalafil and her insurance company refused to refill them.  After this, she took a decided turn for the worse.  [place] in the fall.  She became much more short of breath, just with walking around the house. She developed lightheaded spells, especially with micturation.  Given lightheadedness and presyncope, she was admitted last week.  She was restarted on her medications and she finally got back on her meds at home.  In the hospital, she was noted to be bradycardic with HR to 30s so metoprolol was stopped.  Of note, her abdominal fat pad biopsy did not suggest amyloidosis. Repeat RHC in 3/15 still with moderate to severe PAH and low CI.  Holter off beta blocker in 3/15 showed average HR 73.   Echo in 9/17 showed EF 65-70% with moderate to severe LVH, moderate MR, normal RV size with mildly decreased systolic function, PASP 56 mmHg, small pericardial effusion.   Echo in 10/18 showed EF 60-65% with moderate LVH, moderate MR, severe biatrial enlargement, PASP 62 mmHg, RV normal.  PYP scan in 10/18 was not suggestive of TTR amyloidosis.   Echo in 10/19  showed EF 65-70%, moderate MR, normal RV size and systolic function, PASP 43 mmHg.   Echo in 12/20 showed EF 65-70%, moderate LVH, severe biatrial enlargement, mild-moderate MR, small pericardial effusion, unable to estimate PA systolic pressure.   PYP scan in 12/20 was visually grade 2 with H/CL 1.96 but activity appeared localized to the atria.   She returns for followup of CHF and pulmonary hypertension. On Tyvaso, tadalafil, and macitentan for pulmonary hypertension, able to get all meds without problems. Weight down 1 lb.  No lightheadedness or chest pain.  Stable dyspnea  walking up hills/inclines, no dyspnea on flat ground.  Mild paresthesias on the bottoms of her feet. Recently went to the [employer] for a week with her kids and did quite well, able to participate in the family's vacation activities.     6 minute walk (5/14): 122 m.   6 minute walk (7/14): 152 m 6 minute walk (10/14): 183 m 6 minute walk (4/15): 317 m 6 minute walk (12/15): 259 m 6 minute walk (4/16): 293 m 6 minute walk (8/16): 341 m 6 minute walk (12/16): 274 m 6 minute walk (6/17): 314 m 6 minute walk (9/17): 307 m 6 minute walk (4/18): 366 m 6 minute walk (10/18): 320 m 6 minute walk (3/19): 381 m 6 minute walk (9/19): 366 m 6 minute walk (9/20): 305 m 6 minute walk (12/20): 243 m 6 minute walk (6/21): 304 m  Labs (12/13): HCT 45.1, plts 153, K 3.5, creatinine 1.0 Labs (1/14): BNP 849 Labs (4/14): K 3.1, creatinine 1.0, ANA and anti-SCL-70 antibody negative.  Rheumatoid factor negative.  Serum immunofixation did not show monoclonal light chains.  Labs (5/14): K 3.3, creatinine 0.79, proBNP 5147 Labs (6/14): K 4, creatinine 0.9, BNP 1001 Labs (10/14): LDL 53, LDL-P 975, K 3.7, creatinine 1.2 Labs (11/14): K 3.7, creatinine 0.9, BNP 832 Labs (2/15): K 3.5, creatinine 1.10, HCT 42.6, UPEP negative, SPEP negative.  Labs (3/15): K 3.8, creatinine 0.88 Labs (4/15): K 3.7, creatinine 0.99, proBNP 1653 Labs (7/15): K 4, creatinine 0.93 Labs (12/15): LDL 77, HDL 58, K 3.9, creatinine 1.03, LFTs normal Labs (4/16): K 3.8, creatinine 0.93, BNP 475, plts 105, HCT 42.3 Labs (11/16): K 3.6, creatinine 1.1, HCT 42.2 Labs (12/16): LDL 54, HDL 50, BNP 628 Labs (6/17): K 3.7, creatinine 0.97, HCT 41.3, BNP 489 Labs (9/17): K 3.8, creatinine 0.97, LDL 65, HDL 54 Labs (9/18): K 4.1, creatinine 1.05, hgb 14.6, BNP 646 Labs (10/18): BNP 588, TSH normal, LDL 73, HDL 48, K 3.5, creatinine 0.97, hgb 12.5, plts 91, LFTs normal Labs (3/19): K 4.1, creatinine 1.07, hgb 13.2 Labs (9/19): K 3.8,  creatinine 1.1 Labs (7/20): K 3.9, creatinine 1.3, LDL 63 Labs (9/20): K 4.2, creatinine 1.05, hgb 12.6, BNP 632  Labs (12/20): Myeloma panel negative, urine immunofixation negative.  Labs (1/21): K 4.3, creatinine 1.12, LDL 63 Labs (3/21): K 3.8, creatinine 1.13  PMH: 1. Chronic diastolic CHF: Echo (0/16) with EF 55-60%, severe LVH (no SAM, no asymmetric hypertrophy, no LVOT gradient), moderate-severe LAE, moderately dilated RV with mildly decreased systolic function, moderate to severe RAE, PA systolic pressure 86 mmHg, moderate-severe TR, moderate MR, trivial pericardial effusion.  Cardiac MRI (5/14): EF 65%, severe LVH, no definite evidence for amyloidosis (no delayed enhancement, myocardium not difficult to null).  Echo (2/15) with EF 75%, severe LVH, grade II diastolic dysfunction, moderate MR, RV mildly dilated with mildly decreased systolic function, moderate TR, PA systolic pressure 84 mmHg, small pericardial  effusion. Abdominal fat pad biopsy (2/15) showed no evidence for amyloidosis.  SPEP/UPEP negative 2/15.  - Echo (4/16) with EF 60-65%, severe LVH, RV mildly dilated with mildly decreased systolic function, PA systolic pressure 40 mmHg. - Echo (9/17) with EF 65-70%, severe LVH, moderate MR, normal RV size with mildly decreased systolic function, PASP 56 mmHg, small pericardial effusion.  - Echo (10/18): EF 60-65% with moderate LVH, moderate MR, severe biatrial enlargement, PASP 62 mmHg, RV normal. - PYP scan (10/18) was not suggestive of TTR amyloidosis.  - Echo (10/19): EF 65-70%, moderate MR, normal RV size and systolic function, PASP 43 mmHg. - Echo (12/20): EF 65-70%, moderate LVH, severe biatrial enlargement, mild-moderate MR, small pericardial effusion, unable to estimate PA systolic pressure.  - PYP scan (12/20): grade 2 with H/CL 1.96 but activity localized to the atria.  2. Chronic atrial fibrillation since around 2004.  Developed bradycardia and metoprolol stopped 2/15. Holter  (3/15) with average HR 73, atrial fibrillation, 3.8 sec pause x 1 while asleep, PVCs.  3. HTN: For decades.  4. LHC (2/08) with no significant disease.  5. Chronic thrombocytopenia: ITP 6. Pulmonary arterial HTN: RHC (5/14) with mean RA 13, PA 104/36 (mean 63), mean PCWP 20 on right and 23 on left, CI 2.3 (Fick) and 1.6 (thermo), PVR 10.4 WU (Fick) and 15 WU (thermo).  Vasodilator testing not done as patient was already on amlodipine.  V/Q scan (5/14) with no evidence for chronic PE.  ANA, RF, and anti-SCL70 antibody negative.  PFTs (5/14) with FEV1 60%, FVC 54%, ratio 112%, TLC 61%, DLCO 43% => restrictive defect.  Sleep study (7/14) with no OSA.  CT chest with areas of mosaic attenuation in lungs (saw pulmonary, thought air trapping and not ILD). RHC (3/15) with RA mean 5, PA 66/31 mean 43, PCWP mean 11, Cardiac Index (Fick) 1.97, PVR 9.2 WU.  Patient was started on Tyvaso in 3/15.  Echo (4/16) with mildly dilated and mildly dysfunctional RV, PA systolic pressure 40 mmHg.   Current Outpatient Medications  Medication Sig Dispense Refill  . acetaminophen (TYLENOL) 500 MG tablet Take 500 mg by mouth every 6 (six) hours as needed for mild pain or headache.     Marland Kitchen amLODipine (NORVASC) 2.5 MG tablet TAKE ONE TABLET BY MOUTH ONCE DAILY 30 tablet 0  . Cholecalciferol (VITAMIN D3) 2000 units TABS Take 1 tablet by mouth daily.     Marland Kitchen KLOR-CON M20 20 MEQ tablet TAKE TWO TABLETS BY MOUTH IN THE MORNING AND ONE IN THE EVENING 90 tablet 0  . macitentan (OPSUMIT) 10 MG tablet TAKE 1 TABLET (10 MG TOTAL) BY MOUTH DAILY WITH BREAKFAST. 90 tablet 3  . rosuvastatin (CRESTOR) 20 MG tablet Take 20 mg by mouth at bedtime.     . tadalafil, PAH, (ADCIRCA) 20 MG tablet Take 2 tablets (40 mg total) by mouth daily. 180 tablet 2  . torsemide (DEMADEX) 20 MG tablet Take 1.5 tablets (30 mg total) by mouth 2 (two) times daily. 90 tablet 6  . Treprostinil (TYVASO) 0.6 MG/ML SOLN Inhale 54 mcg into the lungs 4 (four) times daily. 30  mL 5  . warfarin (COUMADIN) 5 MG tablet TAKE AS DIRECTED BY  COUMADIN  CLINIC 90 tablet 1   No current facility-administered medications for this encounter.    Allergies:   Patient has no known allergies.   Social History:  The patient  reports that she has never smoked. She has never used smokeless tobacco. She reports current alcohol  use of about 3.0 standard drinks of alcohol per week. She reports that she does not use drugs.   Family History:  The patient's family history includes Alzheimer's disease in her mother; Heart Problems in her brother and maternal aunt; Heart attack in her father; Hypertension in her brother; Pulmonary Hypertension in her cousin; Thyroid disease in her daughter, daughter, and maternal aunt.   ROS:  Please see the history of present illness.   All other systems are personally reviewed and negative.   Exam:   BP 120/60   Pulse 62   Wt 75.7 kg (166 lb 12.8 oz)   SpO2 94%   BMI 30.02 kg/m   General: NAD Neck: No JVD, no thyromegaly or thyroid nodule.  Lungs: Clear to auscultation bilaterally with normal respiratory effort. CV: Nondisplaced PMI.  Heart regular S1/S2, no S3/S4, no murmur.  No peripheral edema.  No carotid bruit.  Normal pedal pulses.  Abdomen: Soft, nontender, no hepatosplenomegaly, no distention.  Skin: Intact without lesions or rashes.  Neurologic: Alert and oriented x 3.  Psych: Normal affect. Extremities: No clubbing or cyanosis.  HEENT: Normal.   Recent Labs: 03/08/2019: Hemoglobin 12.5; Platelets 113 06/08/2019: B Natriuretic Peptide 649.7 12/19/2019: BUN 17; Creatinine, Ser 1.16; Potassium 3.9; Sodium 142  Personally reviewed   Wt Readings from Last 3 Encounters:  12/19/19 75.7 kg (166 lb 12.8 oz)  11/27/19 72.6 kg (160 lb)  09/07/19 75.9 kg (167 lb 6.4 oz)    ASSESSMENT AND PLAN:  1. Pulmonary HTN: Patient has severe pulmonary arterial HTN.  Last RHC in 3/15 showed CI 1.97, PVR 9.  It is possible that long-standing PCWP  elevation from diastolic LV dysfunction could lead to pulmonary vascular changes with pulmonary arterial hypertension out of proportion to the PCWP elevation.  However, suspect idiopathic primary pulmonary HTN (Group 1). Collagen vascular disease workup was negative (negative RF, ANA and negative anti-SCL-70).  V/Q scan was not suggestive of chronic PEs.  PFTs were suggestive of restrictive lung disease but CT chest and evaluation by pulmonary did not suggest interstitial lung disease.  Sleep study did not show OSA.  Last echo in 12/20 showed normal RV size and systolic function, unable to estimate PA systolic pressure.  She is stable symptomatically, stable 6 minute walk today.  - Continue oxygen at night and with exertion as needed (has not needed during the day).  - Continue Tyvaso, macitentan, Adcirca.  She is doing well on this regimen and is no longer having trouble getting her meds. 2. Chronic diastolic CHF: EF preserved on echo with moderate concentric LVH and a small pericardial effusion.  It is possible that the LVH is due to years of HTN.  The cardiac MRI was not definitively suggestive of cardiac amyloidosis.  I was still concerned for amyloidosis given the appearance of the LV myocardium on echoes.  Abdominal fat pad biopsy in 2015 showed no evidence for amyloidosis. She has some symptoms suggestive of mild peripheral neuropathy.  PYP scan in 10/18 was not suggestive of TTR amyloidosis. I assessed her again for cardiac amyloidosis again in 2020: myeloma panel and urine immunofixation negative, PYP scan in 12/20 was grade 2 with H/CL 1.97 but activity was localized to the atria.  NYHA class II symptoms.  - I have reviewed her PYP scan with colleagues, unusual pattern with uptake localized primarily to the atria.  Have asked for review of data by the [facility].  She does not think that she could do an MRI  again (very claustrophobic the first time).   - Continue torsemide 30 mg bid. BMET today.    3. HTN: BP is controlled.  4. Chronic atrial fibrillation: Continue coumadin.  She is off metoprolol due to bradycardia. Holter off metoprolol did not show any high rate episodes.  5. Hyperlipidemia: On Crestor, good lipids in 7/20.   Followup in 4 months.   Signed, [name], MD  12/20/2019  [place] [address] [facility] [place] [address] [phone] (office) [phone] (fax)

## 2019-12-25 DIAGNOSIS — I509 Heart failure, unspecified: Secondary | ICD-10-CM | POA: Diagnosis not present

## 2019-12-27 DIAGNOSIS — H35051 Retinal neovascularization, unspecified, right eye: Secondary | ICD-10-CM | POA: Diagnosis not present

## 2019-12-27 DIAGNOSIS — H35073 Retinal telangiectasis, bilateral: Secondary | ICD-10-CM | POA: Diagnosis not present

## 2019-12-27 DIAGNOSIS — Z961 Presence of intraocular lens: Secondary | ICD-10-CM | POA: Diagnosis not present

## 2019-12-27 DIAGNOSIS — H35351 Cystoid macular degeneration, right eye: Secondary | ICD-10-CM | POA: Diagnosis not present

## 2019-12-27 DIAGNOSIS — H35371 Puckering of macula, right eye: Secondary | ICD-10-CM | POA: Diagnosis not present

## 2020-01-05 ENCOUNTER — Telehealth (HOSPITAL_COMMUNITY): Payer: Self-pay | Admitting: *Deleted

## 2020-01-05 DIAGNOSIS — E7800 Pure hypercholesterolemia, unspecified: Secondary | ICD-10-CM | POA: Diagnosis not present

## 2020-01-05 DIAGNOSIS — I4891 Unspecified atrial fibrillation: Secondary | ICD-10-CM | POA: Diagnosis not present

## 2020-01-05 NOTE — Telephone Encounter (Signed)
Larey Dresser, MD  Scarlette Calico, RN     Previous Messages   ----- Message -----  From: Elouise Munroe, MD  Sent: 09/14/2019 11:42 AM EDT  To: Larey Dresser, MD   For [facility] virtual consult with [facility] - Dr. Valentina Lucks:   Call [phone] to set up virtual consult - speak to Suanne Marker or Aldona Bar  Send images for overread: PYPs, MRI, Echo   You can mention in referral that Dr. Mervin Hack and I spoke about this patient so it gets directed to her.   Let me know if I can do anything else to help!  [place]

## 2020-01-05 NOTE — Telephone Encounter (Signed)
Called and spoke w/Rhonda at [facility], gave her pt's info, she request we fax records and mail imaging to them once they review all of that they will call pt for appt.  OV note, 2 pyp test, echo, demographic, and insurance info all faxed to them at [phone] atten: Dr Mervin Hack  Will obtain disc w/past 2 PYP test and echo and mail them to:  [facility] Atten: Dr Valentina Lucks [address] [place] [address]

## 2020-01-10 DIAGNOSIS — N1831 Chronic kidney disease, stage 3a: Secondary | ICD-10-CM | POA: Diagnosis not present

## 2020-01-10 DIAGNOSIS — L299 Pruritus, unspecified: Secondary | ICD-10-CM | POA: Diagnosis not present

## 2020-01-10 DIAGNOSIS — I10 Essential (primary) hypertension: Secondary | ICD-10-CM | POA: Diagnosis not present

## 2020-01-10 DIAGNOSIS — I4891 Unspecified atrial fibrillation: Secondary | ICD-10-CM | POA: Diagnosis not present

## 2020-01-17 ENCOUNTER — Other Ambulatory Visit (HOSPITAL_COMMUNITY): Payer: Self-pay | Admitting: *Deleted

## 2020-01-17 DIAGNOSIS — I272 Pulmonary hypertension, unspecified: Secondary | ICD-10-CM

## 2020-01-17 MED ORDER — TADALAFIL (PAH) 20 MG PO TABS: 40.0000 mg | ORAL_TABLET | Freq: Every day | ORAL | 2 refills | Status: DC

## 2020-01-24 DIAGNOSIS — I509 Heart failure, unspecified: Secondary | ICD-10-CM | POA: Diagnosis not present

## 2020-01-30 ENCOUNTER — Other Ambulatory Visit: Payer: Self-pay

## 2020-01-30 ENCOUNTER — Ambulatory Visit (INDEPENDENT_AMBULATORY_CARE_PROVIDER_SITE_OTHER): Payer: Medicare HMO | Admitting: *Deleted

## 2020-01-30 DIAGNOSIS — Z5181 Encounter for therapeutic drug level monitoring: Secondary | ICD-10-CM

## 2020-01-30 DIAGNOSIS — I4891 Unspecified atrial fibrillation: Secondary | ICD-10-CM | POA: Diagnosis not present

## 2020-01-30 LAB — POCT INR: INR: 2.9 (ref 2.0–3.0)

## 2020-01-30 NOTE — Patient Instructions (Signed)
Description   Continue same dosage 1 tablet every day. Recheck in 6 weeks. Call with any medications changes [phone].

## 2020-02-15 ENCOUNTER — Other Ambulatory Visit (HOSPITAL_COMMUNITY): Payer: Self-pay

## 2020-02-15 MED ORDER — TYVASO 0.6 MG/ML IN SOLN: 54.0000 ug | Freq: Four times a day (QID) | RESPIRATORY_TRACT | 5 refills | Status: DC

## 2020-02-18 ENCOUNTER — Other Ambulatory Visit (HOSPITAL_COMMUNITY): Payer: Self-pay | Admitting: Cardiology

## 2020-02-24 DIAGNOSIS — I509 Heart failure, unspecified: Secondary | ICD-10-CM | POA: Diagnosis not present

## 2020-03-01 ENCOUNTER — Other Ambulatory Visit (HOSPITAL_COMMUNITY): Payer: Self-pay | Admitting: *Deleted

## 2020-03-12 ENCOUNTER — Ambulatory Visit (INDEPENDENT_AMBULATORY_CARE_PROVIDER_SITE_OTHER): Payer: Medicare HMO | Admitting: *Deleted

## 2020-03-12 ENCOUNTER — Other Ambulatory Visit: Payer: Self-pay

## 2020-03-12 DIAGNOSIS — I4891 Unspecified atrial fibrillation: Secondary | ICD-10-CM

## 2020-03-12 DIAGNOSIS — Z5181 Encounter for therapeutic drug level monitoring: Secondary | ICD-10-CM | POA: Diagnosis not present

## 2020-03-12 DIAGNOSIS — I482 Chronic atrial fibrillation, unspecified: Secondary | ICD-10-CM | POA: Diagnosis not present

## 2020-03-12 LAB — POCT INR: INR: 3 (ref 2.0–3.0)

## 2020-03-12 NOTE — Patient Instructions (Signed)
Description   ?Continue taking Warfarin 1 tablet everyday. Recheck in 6 weeks. Call with any medications changes [phone]. ?  ?  ?

## 2020-03-26 ENCOUNTER — Telehealth (HOSPITAL_COMMUNITY): Payer: Self-pay | Admitting: Cardiology

## 2020-03-26 DIAGNOSIS — I509 Heart failure, unspecified: Secondary | ICD-10-CM | POA: Diagnosis not present

## 2020-03-26 NOTE — Telephone Encounter (Signed)
Pt stated she never received a phone call to set up a virtual visit from Dr Mervin Hack office, that was stated in last visit, please advise

## 2020-03-27 NOTE — Telephone Encounter (Signed)
[facility] to f/u, they pulled pt's chart and verified they had received records and imaging and state they will reach out to pt to sch virtual visit. Pt is aware

## 2020-03-29 DIAGNOSIS — R69 Illness, unspecified: Secondary | ICD-10-CM | POA: Diagnosis not present

## 2020-04-10 NOTE — Telephone Encounter (Signed)
error

## 2020-04-23 ENCOUNTER — Other Ambulatory Visit: Payer: Self-pay

## 2020-04-23 ENCOUNTER — Ambulatory Visit (INDEPENDENT_AMBULATORY_CARE_PROVIDER_SITE_OTHER): Payer: Medicare HMO | Admitting: *Deleted

## 2020-04-23 DIAGNOSIS — I4891 Unspecified atrial fibrillation: Secondary | ICD-10-CM | POA: Diagnosis not present

## 2020-04-23 DIAGNOSIS — I482 Chronic atrial fibrillation, unspecified: Secondary | ICD-10-CM

## 2020-04-23 DIAGNOSIS — Z5181 Encounter for therapeutic drug level monitoring: Secondary | ICD-10-CM | POA: Diagnosis not present

## 2020-04-23 LAB — POCT INR: INR: 2.6 (ref 2.0–3.0)

## 2020-04-23 NOTE — Patient Instructions (Signed)
Description   Continue taking Warfarin 1 tablet every day. Recheck in 7 weeks. Call with any medications changes [phone].

## 2020-04-25 DIAGNOSIS — I509 Heart failure, unspecified: Secondary | ICD-10-CM | POA: Diagnosis not present

## 2020-04-29 ENCOUNTER — Telehealth (HOSPITAL_COMMUNITY): Payer: Self-pay | Admitting: Pharmacist

## 2020-04-29 NOTE — Telephone Encounter (Signed)
Sent in Tyvaso re-enrollment application to [place], PharmD, BCPS, BCCP, CPP [facility] Pharmacist [phone]

## 2020-05-06 ENCOUNTER — Ambulatory Visit (HOSPITAL_COMMUNITY)
Admission: RE | Admit: 2020-05-06 | Discharge: 2020-05-06 | Disposition: A | Discharge status: Home / Self Care | Payer: Medicare HMO | Source: Ambulatory Visit | Attending: Cardiology | Admitting: Cardiology

## 2020-05-06 ENCOUNTER — Encounter (HOSPITAL_COMMUNITY): Payer: Self-pay | Admitting: Cardiology

## 2020-05-06 ENCOUNTER — Other Ambulatory Visit: Payer: Self-pay

## 2020-05-06 VITALS — BP 126/60 | HR 74 | Wt 166.2 lb

## 2020-05-06 DIAGNOSIS — Z9981 Dependence on supplemental oxygen: Secondary | ICD-10-CM | POA: Insufficient documentation

## 2020-05-06 DIAGNOSIS — I110 Hypertensive heart disease with heart failure: Secondary | ICD-10-CM | POA: Insufficient documentation

## 2020-05-06 DIAGNOSIS — I5022 Chronic systolic (congestive) heart failure: Secondary | ICD-10-CM | POA: Diagnosis not present

## 2020-05-06 DIAGNOSIS — I5032 Chronic diastolic (congestive) heart failure: Secondary | ICD-10-CM

## 2020-05-06 DIAGNOSIS — Z7901 Long term (current) use of anticoagulants: Secondary | ICD-10-CM | POA: Insufficient documentation

## 2020-05-06 DIAGNOSIS — R079 Chest pain, unspecified: Secondary | ICD-10-CM

## 2020-05-06 DIAGNOSIS — Z79899 Other long term (current) drug therapy: Secondary | ICD-10-CM | POA: Diagnosis not present

## 2020-05-06 DIAGNOSIS — I482 Chronic atrial fibrillation, unspecified: Secondary | ICD-10-CM

## 2020-05-06 DIAGNOSIS — I272 Pulmonary hypertension, unspecified: Secondary | ICD-10-CM | POA: Diagnosis not present

## 2020-05-06 DIAGNOSIS — E785 Hyperlipidemia, unspecified: Secondary | ICD-10-CM | POA: Insufficient documentation

## 2020-05-06 DIAGNOSIS — R0789 Other chest pain: Secondary | ICD-10-CM | POA: Diagnosis not present

## 2020-05-06 DIAGNOSIS — I2721 Secondary pulmonary arterial hypertension: Secondary | ICD-10-CM | POA: Insufficient documentation

## 2020-05-06 LAB — BASIC METABOLIC PANEL
Anion gap: 9 (ref 5–15)
BUN: 14 mg/dL (ref 8–23)
CO2: 27 mmol/L (ref 22–32)
Calcium: 9.2 mg/dL (ref 8.9–10.3)
Chloride: 104 mmol/L (ref 98–111)
Creatinine, Ser: 1.19 mg/dL — ABNORMAL HIGH (ref 0.44–1.00)
GFR, Estimated: 47 mL/min — ABNORMAL LOW (ref >60)
Glucose, Bld: 96 mg/dL (ref 70–99)
Potassium: 4.1 mmol/L (ref 3.5–5.1)
Sodium: 140 mmol/L (ref 135–145)

## 2020-05-06 LAB — LIPID PANEL
Cholesterol: 125 mg/dL (ref 0–200)
HDL: 50 mg/dL (ref >40)
LDL Cholesterol: 63 mg/dL (ref 0–99)
Total CHOL/HDL Ratio: 2.5 RATIO
Triglycerides: 61 mg/dL (ref <150)
VLDL: 12 mg/dL (ref 0–40)

## 2020-05-06 NOTE — Progress Notes (Signed)
6 Min Walk Test Completed  Pt ambulated 1060 ft (323 m) O2 Sat ranged 94-96% on 4L oxygen HR ranged 83-105

## 2020-05-06 NOTE — Progress Notes (Signed)
Date:  05/06/2020   ID:  Vicki [name], DOB 1940-12-22, MRN [id number]   Provider location: [facility] Type of Visit: Established patient   PCP:  Deland Pretty, MD  Cardiologist:  Dr. Aundra Dubin   History of Present Illness: Vicki [name] is a 79 y.o. female who has a history of chronic diastolic CHF, pulmonary hypertension and chronic atrial fibrillation.  Patient was followed by Dr. Glade Lloyd in the past for chronic atrial fibrillation.  She has been on coumadin.  She reports progressive exertional dyspnea since 2011.  This gradually worsened and became quite significant over the last few months.  She used to have significant HTN, but more recently her BP has been on the lower side.  Echo was done in 2/14, showing severe concentric LVH with EF 55-60%, moderately dilated RV, moderate to severe TR, and PA systolic pressure 86 mmHg.  I did a right heart cath in 5/14.  This showed PA pressure 104/36 with PCWP 20, suggesting pulmonary arterial HTN well out of proportion to the mildly elevate wedge pressure.  She was already on amlodipine so I did not do vasodilator testing.  V/Q scan was done, showing no evidence for chronic PEs.  PFTs showed a restrictive defect. Cardiac MRI did not show definite evidence for amyloid.  I started her on macitentan 10 mg daily.  Initially, she felt better on macitentan.  However, she was admitted in 5/14 from her sleep study due to orthopnea and dyspnea.  She was diuresed for several days and diuretic was switched over to torsemide.  I next started her on tadalafil 20 mg daily and titrated up to 40 mg daily.  She thinks that this helped.  She saw pulmonary after chest CT (showed mosaic attenuation in lungs).  This was thought to be due to air-trapping rather than ILD.  She was started on Spiriva. She wears oxygen at home.   She had an echocardiogram in 2/15 that showed severe LVH, EF 75%, small pericardial effusion, mildly dilated RV with mildly  decreased systolic function, PA systolic pressure 84 mmHg.  She is not totally sure if she was taking macitentan and tadalafil at that time.  She has had a hard month.  She ran out of her macitentan and tadalafil and her insurance company refused to refill them.  After this, she took a decided turn for the worse.  [place] her [place] in the fall.  She became much more short of breath, just with walking around the house. She developed lightheaded spells, especially with micturation.  Given lightheadedness and presyncope, she was admitted last week.  She was restarted on her medications and she finally got back on her meds at home.  In the hospital, she was noted to be bradycardic with HR to 30s so metoprolol was stopped.  Of note, her abdominal fat pad biopsy did not suggest amyloidosis. Repeat RHC in 3/15 still with moderate to severe PAH and low CI.  Holter off beta blocker in 3/15 showed average HR 73.   Echo in 9/17 showed EF 65-70% with moderate to severe LVH, moderate MR, normal RV size with mildly decreased systolic function, PASP 56 mmHg, small pericardial effusion.   Echo in 10/18 showed EF 60-65% with moderate LVH, moderate MR, severe biatrial enlargement, PASP 62 mmHg, RV normal.  PYP scan in 10/18 was not suggestive of TTR amyloidosis.   Echo in 10/19  showed EF 65-70%, moderate MR, normal RV size and systolic function, PASP 43 mmHg.   Echo in 12/20 showed EF 65-70%, moderate LVH, severe biatrial enlargement, mild-moderate MR, small pericardial effusion, unable to estimate PA systolic pressure.   PYP scan in 12/20 was visually grade 2 with H/CL 1.96 but activity appeared localized to the atria. Invitae gene testing for TTR amyloidosis in 3/21 was negative.   She returns for followup of CHF and pulmonary hypertension. On Tyvaso, tadalafil, and macitentan for pulmonary hypertension, able to get all meds without problems. Weight  stable.  No lightheadedness.  She has had 3-4 episodes a month where she will get heaviness in her chest that will last for a few minutes then resolve.  She associates this with pulling her trashcans to the street but thinks that it happens the day AFTER she pulls the trashcans.  Stable dyspnea walking up hills/inclines, no dyspnea on flat ground for short distances.  She gets mildly short of breath walking from one side of [employer] to the other.  Mild paresthesias on the bottoms of her feet.   ECG (personally reviewed): Atrial fibrillation, lateral TWIs  6 minute walk (5/14): 122 m.   6 minute walk (7/14): 152 m 6 minute walk (10/14): 183 m 6 minute walk (4/15): 317 m 6 minute walk (12/15): 259 m 6 minute walk (4/16): 293 m 6 minute walk (8/16): 341 m 6 minute walk (12/16): 274 m 6 minute walk (6/17): 314 m 6 minute walk (9/17): 307 m 6 minute walk (4/18): 366 m 6 minute walk (10/18): 320 m 6 minute walk (3/19): 381 m 6 minute walk (9/19): 366 m 6 minute walk (9/20): 305 m 6 minute walk (12/20): 243 m 6 minute walk (6/21): 304 m 6 minute walk (11/21): 323 m  Labs (12/13): HCT 45.1, plts 153, K 3.5, creatinine 1.0 Labs (1/14): BNP 849 Labs (4/14): K 3.1, creatinine 1.0, ANA and anti-SCL-70 antibody negative.  Rheumatoid factor negative.  Serum immunofixation did not show monoclonal light chains.  Labs (5/14): K 3.3, creatinine 0.79, proBNP 5147 Labs (6/14): K 4, creatinine 0.9, BNP 1001 Labs (10/14): LDL 53, LDL-P 975, K 3.7, creatinine 1.2 Labs (11/14): K 3.7, creatinine 0.9, BNP 832 Labs (2/15): K 3.5, creatinine 1.10, HCT 42.6, UPEP negative, SPEP negative.  Labs (3/15): K 3.8, creatinine 0.88 Labs (4/15): K 3.7, creatinine 0.99, proBNP 1653 Labs (7/15): K 4, creatinine 0.93 Labs (12/15): LDL 77, HDL 58, K 3.9, creatinine 1.03, LFTs normal Labs (4/16): K 3.8, creatinine 0.93, BNP 475, plts 105, HCT 42.3 Labs (11/16): K 3.6, creatinine 1.1, HCT 42.2 Labs (12/16): LDL 54, HDL  50, BNP 628 Labs (6/17): K 3.7, creatinine 0.97, HCT 41.3, BNP 489 Labs (9/17): K 3.8, creatinine 0.97, LDL 65, HDL 54 Labs (9/18): K 4.1, creatinine 1.05, hgb 14.6, BNP 646 Labs (10/18): BNP 588, TSH normal, LDL 73, HDL 48, K 3.5, creatinine 0.97, hgb 12.5, plts 91, LFTs normal Labs (3/19): K 4.1, creatinine 1.07, hgb 13.2 Labs (9/19): K 3.8, creatinine 1.1 Labs (7/20): K 3.9, creatinine 1.3, LDL 63 Labs (9/20): K 4.2, creatinine 1.05, hgb 12.6, BNP 632  Labs (12/20): Myeloma panel negative, urine immunofixation negative.  Labs (1/21): K 4.3, creatinine 1.12, LDL 63 Labs (3/21): K 3.8, creatinine 1.13 Labs (6/21): K 3.9, creatinine 1.16  PMH: 1. Chronic diastolic CHF: Echo (7/03) with EF 55-60%, severe LVH (no SAM, no asymmetric hypertrophy, no LVOT gradient), moderate-severe LAE, moderately dilated RV with mildly decreased systolic function, moderate to  severe RAE, PA systolic pressure 86 mmHg, moderate-severe TR, moderate MR, trivial pericardial effusion.  Cardiac MRI (5/14): EF 65%, severe LVH, no definite evidence for amyloidosis (no delayed enhancement, myocardium not difficult to null).  Echo (2/15) with EF 75%, severe LVH, grade II diastolic dysfunction, moderate MR, RV mildly dilated with mildly decreased systolic function, moderate TR, PA systolic pressure 84 mmHg, small pericardial effusion. Abdominal fat pad biopsy (2/15) showed no evidence for amyloidosis.  SPEP/UPEP negative 2/15.  - Echo (4/16) with EF 60-65%, severe LVH, RV mildly dilated with mildly decreased systolic function, PA systolic pressure 40 mmHg. - Echo (9/17) with EF 65-70%, severe LVH, moderate MR, normal RV size with mildly decreased systolic function, PASP 56 mmHg, small pericardial effusion.  - Echo (10/18): EF 60-65% with moderate LVH, moderate MR, severe biatrial enlargement, PASP 62 mmHg, RV normal. - PYP scan (10/18) was not suggestive of TTR amyloidosis.  - Echo (10/19): EF 65-70%, moderate MR, normal RV  size and systolic function, PASP 43 mmHg. - Echo (12/20): EF 65-70%, moderate LVH, severe biatrial enlargement, mild-moderate MR, small pericardial effusion, unable to estimate PA systolic pressure.  - PYP scan (12/20): grade 2 with H/CL 1.96 but activity localized to the atria.  - Invitae gene testing for TTR amyloidosis in 3/21 was negative.  2. Chronic atrial fibrillation since around 2004.  Developed bradycardia and metoprolol stopped 2/15. Holter (3/15) with average HR 73, atrial fibrillation, 3.8 sec pause x 1 while asleep, PVCs.  3. HTN: For decades.  4. LHC (2/08) with no significant disease.  5. Chronic thrombocytopenia: ITP 6. Pulmonary arterial HTN: RHC (5/14) with mean RA 13, PA 104/36 (mean 63), mean PCWP 20 on right and 23 on left, CI 2.3 (Fick) and 1.6 (thermo), PVR 10.4 WU (Fick) and 15 WU (thermo).  Vasodilator testing not done as patient was already on amlodipine.  V/Q scan (5/14) with no evidence for chronic PE.  ANA, RF, and anti-SCL70 antibody negative.  PFTs (5/14) with FEV1 60%, FVC 54%, ratio 112%, TLC 61%, DLCO 43% => restrictive defect.  Sleep study (7/14) with no OSA.  CT chest with areas of mosaic attenuation in lungs (saw pulmonary, thought air trapping and not ILD). RHC (3/15) with RA mean 5, PA 66/31 mean 43, PCWP mean 11, Cardiac Index (Fick) 1.97, PVR 9.2 WU.  Patient was started on Tyvaso in 3/15.  Echo (4/16) with mildly dilated and mildly dysfunctional RV, PA systolic pressure 40 mmHg.   Current Outpatient Medications  Medication Sig Dispense Refill  . acetaminophen (TYLENOL) 500 MG tablet Take 500 mg by mouth every 6 (six) hours as needed for mild pain or headache.     Marland Kitchen amLODipine (NORVASC) 2.5 MG tablet TAKE ONE TABLET BY MOUTH ONCE DAILY 30 tablet 0  . Cholecalciferol (VITAMIN D3) 2000 units TABS Take 1 tablet by mouth daily.     Marland Kitchen KLOR-CON M20 20 MEQ tablet TAKE TWO TABLETS BY MOUTH IN THE MORNING AND ONE IN THE EVENING 90 tablet 0  . macitentan (OPSUMIT) 10  MG tablet TAKE 1 TABLET (10 MG TOTAL) BY MOUTH DAILY WITH BREAKFAST. 90 tablet 3  . rosuvastatin (CRESTOR) 20 MG tablet Take 20 mg by mouth at bedtime.     . tadalafil, PAH, (ADCIRCA) 20 MG tablet Take 2 tablets (40 mg total) by mouth daily. 180 tablet 2  . torsemide (DEMADEX) 20 MG tablet Take 1.5 tablets (30 mg total) by mouth 2 (two) times daily. 90 tablet 6  . Treprostinil (TYVASO)  0.6 MG/ML SOLN Inhale 54 mcg into the lungs 4 (four) times daily. 30 mL 5  . warfarin (COUMADIN) 5 MG tablet TAKE AS DIRECTED BY  COUMADIN  CLINIC 90 tablet 0   No current facility-administered medications for this encounter.    Allergies:   Patient has no known allergies.   Social History:  The patient  reports that she has never smoked. She has never used smokeless tobacco. She reports current alcohol use of about 3.0 standard drinks of alcohol per week. She reports that she does not use drugs.   Family History:  The patient's family history includes Alzheimer's disease in her mother; Heart Problems in her brother and maternal aunt; Heart attack in her father; Hypertension in her brother; Pulmonary Hypertension in her cousin; Thyroid disease in her daughter, daughter, and maternal aunt.   ROS:  Please see the history of present illness.   All other systems are personally reviewed and negative.   Exam:   BP 126/60   Pulse 74   Wt 75.4 kg (166 lb 3.2 oz)   SpO2 96%   BMI 29.91 kg/m   General: NAD Neck: No JVD, no thyromegaly or thyroid nodule.  Lungs: Clear to auscultation bilaterally with normal respiratory effort. CV: Nondisplaced PMI.  Heart regular S1/S2, no S3/S4, 2/6 SEM RUSB.  No peripheral edema.  No carotid bruit.  Normal pedal pulses.  Abdomen: Soft, nontender, no hepatosplenomegaly, no distention.  Skin: Intact without lesions or rashes.  Neurologic: Alert and oriented x 3.  Psych: Normal affect. Extremities: No clubbing or cyanosis.  HEENT: Normal.   Recent Labs: 06/08/2019: B  Natriuretic Peptide 649.7 05/06/2020: BUN 14; Creatinine, Ser 1.19; Potassium 4.1; Sodium 140  Personally reviewed   Wt Readings from Last 3 Encounters:  05/06/20 75.4 kg (166 lb 3.2 oz)  12/19/19 75.7 kg (166 lb 12.8 oz)  11/27/19 72.6 kg (160 lb)    ASSESSMENT AND PLAN:  1. Pulmonary HTN: Patient has severe pulmonary arterial HTN.  Last RHC in 3/15 showed CI 1.97, PVR 9.  It is possible that long-standing PCWP elevation from diastolic LV dysfunction could lead to pulmonary vascular changes with pulmonary arterial hypertension out of proportion to the PCWP elevation.  However, suspect idiopathic primary pulmonary HTN (Group 1). Collagen vascular disease workup was negative (negative RF, ANA and negative anti-SCL-70).  V/Q scan was not suggestive of chronic PEs.  PFTs were suggestive of restrictive lung disease but CT chest and evaluation by pulmonary did not suggest interstitial lung disease.  Sleep study did not show OSA.  Last echo in 12/20 showed normal RV size and systolic function, unable to estimate PA systolic pressure.  She is stable symptomatically, improved 6 minute walk today.  - Continue oxygen at night and with exertion as needed (has not needed during the day).  - Continue Tyvaso, macitentan, Adcirca.  She is doing well on this regimen and is no longer having trouble getting her meds. - Repeat echo at followup in 3 months.  2. Chronic diastolic CHF: EF preserved on echo with moderate concentric LVH and a small pericardial effusion.  It is possible that the LVH is due to years of HTN.  The cardiac MRI was not definitively suggestive of cardiac amyloidosis.  I was still concerned for amyloidosis given the appearance of the LV myocardium on echoes.  Abdominal fat pad biopsy in 2015 showed no evidence for amyloidosis. She has some symptoms suggestive of mild peripheral neuropathy.  PYP scan in 10/18 was not  suggestive of TTR amyloidosis. I assessed her again for cardiac amyloidosis in 2020:  myeloma panel and urine immunofixation negative, PYP scan in 12/20 was grade 2 with H/CL 1.97 but activity was localized to the atria.  Invitae gene testing for TTR amyloidosis was negative.  NYHA class II symptoms.  She is not volume overloaded.  - I have reviewed her PYP scan with colleagues, unusual pattern with uptake localized primarily to the atria and not the ventricles.  She does not think that she could do an MRI again (very claustrophobic the first time).  I am going to refer her to the amyloidosis clinic at [facility] for another opinion on treatment here => should we treat her for TTR cardiac amyloidosis?  - Continue torsemide 30 mg bid. BMET today.   3. HTN: BP is controlled.  4. Chronic atrial fibrillation: Continue coumadin.  She is off metoprolol due to bradycardia. Holter off metoprolol did not show any high rate episodes.  5. Hyperlipidemia: On Crestor, check lipids today.  6. Chest pain: Atypical.   - I will arrange for Lexiscan Cardiolite to risk stratify.   Followup in 3 months with echo.   Signed, [name], MD  05/06/2020  [place] [address] [facility] [place] [address] [phone] (office) [phone] (fax)

## 2020-05-06 NOTE — Patient Instructions (Addendum)
No medication changes today   You will be referred to the [facility]. You will get a call to schedule this appointment.  Labs today We will only contact you if something comes back abnormal or we need to make some changes. Otherwise no news is good news!   You have been ordered for a Lexiscan (Myoview) of your heart for chest pain.  You will get a call to schedule this appointment.    How to Prepare for Your Myoview Test (stress test):  1. Please do not take these medications before your test: NA 2. Your remaining medications may be taken with water. 3. Nothing to eat or drink, except water, 4 hours prior to arrival time.  NO caffeine/decaffeinated products, or chocolate 12 hours prior to arrival. 4. Ladies, please do not wear dresses.  Skirts or pants are approprate, please wear a short sleeve shirt. 5. NO perfume, cologne or lotion 6. Wear comfortable walking shoes.  NO HEELS! 7. Total time is 3 to 4 hours; you may want to bring reading material for the waiting time. 8. Please report to [facility] for your test  What to expect after you arrive:  Once you arrive and check in for your appointment an IV will be started in your arm.  Then the Technoligist will inject a small amount of radioactive tracer.  There will be a 1 hour waiting period after this injection.  A series of pictures will be taken of your heart following this waiting period.  You will be prepped for the stress portion of the test.  During the stress portion of your test you will either walk on a treadmill or receive a small, safe amount of radioactive tracer injected in your IV.  After the stress portion, there is a short rest period during which time your heart and blood pressure will be monitored.  After the short rest period the Technologist will begin your second set of pictures.  Your doctor will inform you of your test results within 7-10 business days.  In preparation for your appointment, medication  and supplies will be purchased.  Appointment availability is limited, so if you need to cancel or reschedule please call the office at [phone] 24 hours in advance to avoid a cancellation fee of $100.00   Your physician has requested that you have an echocardiogram. Echocardiography is a painless test that uses sound waves to create images of your heart. It provides your doctor with information about the size and shape of your heart and how well your heart's chambers and valves are working. This procedure takes approximately one hour. There are no restrictions for this procedure.  Your physician recommends that you schedule a follow-up appointment in: 3 months with Dr Aundra Dubin  Please call office at [phone] option 2 if you have any questions or concerns.   At the [facility], you and your health needs are our priority. As part of our continuing mission to provide you with exceptional heart care, we have created designated Provider Care Teams. These Care Teams include your primary Cardiologist (physician) and Advanced Practice Providers (APPs- Physician Assistants and Nurse Practitioners) who all work together to provide you with the care you need, when you need it.   You may see any of the following providers on your designated Care Team at your next follow up: Marland Kitchen Dr Glori Bickers . Dr [name] . Darrick Grinder, NP . Lyda Jester, PA . [name], PharmD   Please  be sure to bring in all your medications bottles to every appointment.

## 2020-05-21 ENCOUNTER — Telehealth: Payer: Self-pay

## 2020-05-21 NOTE — Telephone Encounter (Signed)
Spoke with the patient, detailed instructions given. She stated that she would be here for her test. Asked to call back with any questions. S.Zacory Fiola EMTP

## 2020-05-22 ENCOUNTER — Encounter (HOSPITAL_COMMUNITY): Payer: Medicare HMO

## 2020-05-26 DIAGNOSIS — I509 Heart failure, unspecified: Secondary | ICD-10-CM | POA: Diagnosis not present

## 2020-05-28 ENCOUNTER — Other Ambulatory Visit: Payer: Self-pay

## 2020-05-28 ENCOUNTER — Ambulatory Visit (HOSPITAL_COMMUNITY): Payer: Medicare HMO | Attending: Cardiovascular Disease

## 2020-05-28 DIAGNOSIS — R079 Chest pain, unspecified: Secondary | ICD-10-CM

## 2020-05-28 LAB — MYOCARDIAL PERFUSION IMAGING
LV dias vol: 77 mL (ref 46–106)
LV sys vol: 46 mL
Peak HR: 81 {beats}/min
Rest HR: 56 {beats}/min
SDS: 3
SRS: 3
SSS: 6
TID: 1.02

## 2020-05-28 MED ORDER — TECHNETIUM TC 99M TETROFOSMIN IV KIT
7.9000 | PACK | Freq: Once | INTRAVENOUS | Status: AC | PRN
  Administered 2020-05-28: 7.9 via INTRAVENOUS
  Filled 2020-05-28: qty 8

## 2020-05-28 MED ORDER — REGADENOSON 0.4 MG/5ML IV SOLN
0.4000 mg | Freq: Once | INTRAVENOUS | Status: AC
  Administered 2020-05-28: 0.4 mg via INTRAVENOUS

## 2020-05-28 MED ORDER — TECHNETIUM TC 99M TETROFOSMIN IV KIT
28.3000 | PACK | Freq: Once | INTRAVENOUS | Status: AC | PRN
  Administered 2020-05-28: 28.3 via INTRAVENOUS
  Filled 2020-05-28: qty 29

## 2020-05-31 ENCOUNTER — Other Ambulatory Visit (HOSPITAL_COMMUNITY): Payer: Self-pay | Admitting: Cardiology

## 2020-05-31 ENCOUNTER — Telehealth (HOSPITAL_COMMUNITY): Payer: Self-pay

## 2020-05-31 NOTE — Telephone Encounter (Signed)
Malena Edman, RN  05/31/2020 10:19 AM EST Back to Top    Patient advised and verbalized understanding. Echo sch for Feb 2022 with f/u appt   Valeda Malm, RN  05/29/2020 9:09 AM EST     LM for patient to return call

## 2020-05-31 NOTE — Telephone Encounter (Signed)
-----  Message from Larey Dresser, MD sent at 05/28/2020 10:27 PM EST ----- No ischemia, EF likely inaccurate but reading low.  Would go ahead and get echo to reassess LV function.

## 2020-06-03 ENCOUNTER — Other Ambulatory Visit (HOSPITAL_COMMUNITY): Payer: Self-pay | Admitting: Cardiology

## 2020-06-03 ENCOUNTER — Other Ambulatory Visit: Payer: Self-pay | Admitting: *Deleted

## 2020-06-03 MED ORDER — WARFARIN SODIUM 5 MG PO TABS
ORAL_TABLET | ORAL | 0 refills | Status: DC
Start: 2020-06-03 — End: 2020-06-04

## 2020-06-05 DIAGNOSIS — E785 Hyperlipidemia, unspecified: Secondary | ICD-10-CM | POA: Diagnosis not present

## 2020-06-05 DIAGNOSIS — E261 Secondary hyperaldosteronism: Secondary | ICD-10-CM | POA: Diagnosis not present

## 2020-06-05 DIAGNOSIS — I272 Pulmonary hypertension, unspecified: Secondary | ICD-10-CM | POA: Diagnosis not present

## 2020-06-05 DIAGNOSIS — J309 Allergic rhinitis, unspecified: Secondary | ICD-10-CM | POA: Diagnosis not present

## 2020-06-05 DIAGNOSIS — K219 Gastro-esophageal reflux disease without esophagitis: Secondary | ICD-10-CM | POA: Diagnosis not present

## 2020-06-05 DIAGNOSIS — E669 Obesity, unspecified: Secondary | ICD-10-CM | POA: Diagnosis not present

## 2020-06-05 DIAGNOSIS — D6869 Other thrombophilia: Secondary | ICD-10-CM | POA: Diagnosis not present

## 2020-06-05 DIAGNOSIS — I4891 Unspecified atrial fibrillation: Secondary | ICD-10-CM | POA: Diagnosis not present

## 2020-06-05 DIAGNOSIS — Z008 Encounter for other general examination: Secondary | ICD-10-CM | POA: Diagnosis not present

## 2020-06-05 DIAGNOSIS — I110 Hypertensive heart disease with heart failure: Secondary | ICD-10-CM | POA: Diagnosis not present

## 2020-06-05 DIAGNOSIS — I509 Heart failure, unspecified: Secondary | ICD-10-CM | POA: Diagnosis not present

## 2020-06-06 ENCOUNTER — Other Ambulatory Visit: Payer: Self-pay | Admitting: Pharmacist

## 2020-06-06 MED ORDER — WARFARIN SODIUM 5 MG PO TABS
ORAL_TABLET | ORAL | 0 refills | Status: DC
Start: 2020-06-06 — End: 2020-09-03

## 2020-06-11 ENCOUNTER — Other Ambulatory Visit: Payer: Self-pay

## 2020-06-11 ENCOUNTER — Ambulatory Visit: Payer: Medicare HMO

## 2020-06-11 DIAGNOSIS — I4891 Unspecified atrial fibrillation: Secondary | ICD-10-CM

## 2020-06-11 DIAGNOSIS — I482 Chronic atrial fibrillation, unspecified: Secondary | ICD-10-CM | POA: Diagnosis not present

## 2020-06-11 DIAGNOSIS — Z5181 Encounter for therapeutic drug level monitoring: Secondary | ICD-10-CM

## 2020-06-11 LAB — POCT INR: INR: 2.8 (ref 2.0–3.0)

## 2020-06-11 NOTE — Patient Instructions (Signed)
Description   Continue taking Warfarin 1 tablet every day. Recheck in 8 weeks. Call with any medications changes [phone].

## 2020-06-25 DIAGNOSIS — I509 Heart failure, unspecified: Secondary | ICD-10-CM | POA: Diagnosis not present

## 2020-07-03 DIAGNOSIS — H35073 Retinal telangiectasis, bilateral: Secondary | ICD-10-CM | POA: Diagnosis not present

## 2020-07-03 DIAGNOSIS — H35351 Cystoid macular degeneration, right eye: Secondary | ICD-10-CM | POA: Diagnosis not present

## 2020-07-03 DIAGNOSIS — Z961 Presence of intraocular lens: Secondary | ICD-10-CM | POA: Diagnosis not present

## 2020-07-03 DIAGNOSIS — H3561 Retinal hemorrhage, right eye: Secondary | ICD-10-CM | POA: Diagnosis not present

## 2020-07-03 DIAGNOSIS — H54414A Blindness right eye category 4, normal vision left eye: Secondary | ICD-10-CM | POA: Diagnosis not present

## 2020-07-03 DIAGNOSIS — H35371 Puckering of macula, right eye: Secondary | ICD-10-CM | POA: Diagnosis not present

## 2020-07-11 ENCOUNTER — Telehealth (HOSPITAL_COMMUNITY): Payer: Self-pay | Admitting: Pharmacist

## 2020-07-11 NOTE — Telephone Encounter (Signed)
Patient Advocate Encounter   Received notification from [facility] that prior authorization for tadalafil is required.   PA submitted on CoverMyMeds Key [id number] Status is pending   Will continue to follow.   [name], PharmD, BCPS, BCCP, CPP Heart Failure Clinic Pharmacist [phone]

## 2020-07-11 NOTE — Telephone Encounter (Signed)
Advanced Heart Failure Patient Advocate Encounter  Prior Authorization for tadalafil has been approved.    Effective dates: 06/29/20 through 06/28/21  [name], PharmD, BCPS, BCCP, CPP Heart Failure Clinic Pharmacist [phone]

## 2020-07-18 ENCOUNTER — Telehealth (HOSPITAL_COMMUNITY): Payer: Self-pay | Admitting: Pharmacist

## 2020-07-18 NOTE — Telephone Encounter (Signed)
Attempted PA for Opsumit. Per Cover My Meds,  patient currently has access to the requested medication and a Prior Authorization is not needed.   [name], PharmD, BCPS, BCCP, CPP Heart Failure Clinic Pharmacist [phone]

## 2020-07-26 DIAGNOSIS — I509 Heart failure, unspecified: Secondary | ICD-10-CM | POA: Diagnosis not present

## 2020-07-31 ENCOUNTER — Telehealth (HOSPITAL_COMMUNITY): Payer: Self-pay | Admitting: Pharmacy Technician

## 2020-07-31 NOTE — Telephone Encounter (Signed)
The patient called and left a message stating that she was having issues with her Opsumit. [place] and they needed updated insurance information. Provided them with her new [employer] information.   Also, confirmed they had her current [employer] on file. The patient stated that she only had two days of Opsumit left at this time. The representative stated that he would send an expedited review to the management/insurance team. They are hoping to have the insurance verified today, to send out to her tomorrow.  Called and left the patient a message with this update. Advised her to call me with any other issues.  Charlann Boxer, CPhT

## 2020-08-02 ENCOUNTER — Telehealth (HOSPITAL_COMMUNITY): Payer: Self-pay | Admitting: Pharmacy Technician

## 2020-08-02 NOTE — Telephone Encounter (Signed)
Advanced Heart Failure Patient Advocate Encounter   Patient was approved to receive Tyvaso from [employer]  Patient ID: [id number] Effective dates: 06/29/20 through 06/28/21  Charlann Boxer, CPhT

## 2020-08-06 ENCOUNTER — Ambulatory Visit: Payer: Medicare HMO | Admitting: *Deleted

## 2020-08-06 ENCOUNTER — Other Ambulatory Visit: Payer: Self-pay

## 2020-08-06 DIAGNOSIS — I4891 Unspecified atrial fibrillation: Secondary | ICD-10-CM | POA: Diagnosis not present

## 2020-08-06 DIAGNOSIS — Z5181 Encounter for therapeutic drug level monitoring: Secondary | ICD-10-CM

## 2020-08-06 LAB — POCT INR: INR: 2.3 (ref 2.0–3.0)

## 2020-08-06 NOTE — Patient Instructions (Signed)
Description   Continue taking Warfarin 1 tablet every day. Recheck in 8 weeks. Call with any medications changes [phone].

## 2020-08-09 ENCOUNTER — Ambulatory Visit (HOSPITAL_BASED_OUTPATIENT_CLINIC_OR_DEPARTMENT_OTHER)
Admission: RE | Admit: 2020-08-09 | Discharge: 2020-08-09 | Disposition: A | Discharge status: Home / Self Care | Payer: Medicare HMO | Source: Ambulatory Visit | Attending: Cardiology | Admitting: Cardiology

## 2020-08-09 ENCOUNTER — Other Ambulatory Visit: Payer: Self-pay

## 2020-08-09 ENCOUNTER — Ambulatory Visit (HOSPITAL_COMMUNITY)
Admission: RE | Admit: 2020-08-09 | Discharge: 2020-08-09 | Disposition: A | Discharge status: Home / Self Care | Payer: Medicare HMO | Source: Ambulatory Visit | Attending: Cardiology | Admitting: Cardiology

## 2020-08-09 ENCOUNTER — Encounter (HOSPITAL_COMMUNITY): Payer: Self-pay | Admitting: Cardiology

## 2020-08-09 VITALS — BP 122/64 | HR 60 | Wt 162.8 lb

## 2020-08-09 DIAGNOSIS — R001 Bradycardia, unspecified: Secondary | ICD-10-CM | POA: Insufficient documentation

## 2020-08-09 DIAGNOSIS — D696 Thrombocytopenia, unspecified: Secondary | ICD-10-CM | POA: Diagnosis not present

## 2020-08-09 DIAGNOSIS — I110 Hypertensive heart disease with heart failure: Secondary | ICD-10-CM | POA: Insufficient documentation

## 2020-08-09 DIAGNOSIS — I2721 Secondary pulmonary arterial hypertension: Secondary | ICD-10-CM | POA: Diagnosis not present

## 2020-08-09 DIAGNOSIS — E785 Hyperlipidemia, unspecified: Secondary | ICD-10-CM | POA: Diagnosis not present

## 2020-08-09 DIAGNOSIS — I313 Pericardial effusion (noninflammatory): Secondary | ICD-10-CM | POA: Diagnosis not present

## 2020-08-09 DIAGNOSIS — I5032 Chronic diastolic (congestive) heart failure: Secondary | ICD-10-CM

## 2020-08-09 DIAGNOSIS — I482 Chronic atrial fibrillation, unspecified: Secondary | ICD-10-CM | POA: Insufficient documentation

## 2020-08-09 DIAGNOSIS — Z79899 Other long term (current) drug therapy: Secondary | ICD-10-CM | POA: Diagnosis not present

## 2020-08-09 DIAGNOSIS — Z8249 Family history of ischemic heart disease and other diseases of the circulatory system: Secondary | ICD-10-CM | POA: Diagnosis not present

## 2020-08-09 DIAGNOSIS — Z7901 Long term (current) use of anticoagulants: Secondary | ICD-10-CM | POA: Diagnosis not present

## 2020-08-09 DIAGNOSIS — I272 Pulmonary hypertension, unspecified: Secondary | ICD-10-CM

## 2020-08-09 LAB — CBC
HCT: 40.6 % (ref 36.0–46.0)
Hemoglobin: 12.5 g/dL (ref 12.0–15.0)
MCH: 30.3 pg (ref 26.0–34.0)
MCHC: 30.8 g/dL (ref 30.0–36.0)
MCV: 98.5 fL (ref 80.0–100.0)
Platelets: 72 10*3/uL — ABNORMAL LOW (ref 150–400)
RBC: 4.12 MIL/uL (ref 3.87–5.11)
RDW: 13.8 % (ref 11.5–15.5)
WBC: 4 10*3/uL (ref 4.0–10.5)
nRBC: 0 % (ref 0.0–0.2)

## 2020-08-09 LAB — ECHOCARDIOGRAM COMPLETE
Calc EF: 57.2 %
MV M vel: 5.2 m/s
MV Peak grad: 108.2 mmHg
S' Lateral: 3.3 cm
Single Plane A2C EF: 61 %
Single Plane A4C EF: 52.1 %

## 2020-08-09 LAB — BASIC METABOLIC PANEL
Anion gap: 9 (ref 5–15)
BUN: 18 mg/dL (ref 8–23)
CO2: 28 mmol/L (ref 22–32)
Calcium: 9.9 mg/dL (ref 8.9–10.3)
Chloride: 106 mmol/L (ref 98–111)
Creatinine, Ser: 1.14 mg/dL — ABNORMAL HIGH (ref 0.44–1.00)
GFR, Estimated: 49 mL/min — ABNORMAL LOW (ref >60)
Glucose, Bld: 97 mg/dL (ref 70–99)
Potassium: 4.3 mmol/L (ref 3.5–5.1)
Sodium: 143 mmol/L (ref 135–145)

## 2020-08-09 LAB — BRAIN NATRIURETIC PEPTIDE: B Natriuretic Peptide: 914.9 pg/mL — ABNORMAL HIGH (ref 0.0–100.0)

## 2020-08-09 NOTE — Progress Notes (Signed)
Six Minute Walk - 08/09/20 1725      Six Minute Walk   Supplemental oxygen during test? Yes    O2 Flow Rate (L/min) 4 L/min    Type Continuous    Baseline Heartrate 54    Baseline SPO2 94 %      Interval Oxygen Saturation and HR    2 Minute Oxygen Saturation % 97 %    2 Minute HR 72    4 Minute Oxygen Saturation % 96 %    4 Minute HR 83    6 Minute Oxygen Saturation % 96 %    6 Minute HR 101      End of Test Values   Heartrate 103    SPO2 96 %      2 Minutes Post Walk Values   Heartrate 92    SPO2 96 %    Stopped or paused before six minutes? No      Interpretation   Tech Comments: walked 1064fet;304.8meters

## 2020-08-09 NOTE — Progress Notes (Signed)
  Echocardiogram 2D Echocardiogram has been performed.  Michiel Cowboy 08/09/2020, 11:51 AM

## 2020-08-09 NOTE — Patient Instructions (Signed)
Labs done today. We will contact you only if your labs are abnormal.  No medication changes were made. Please continue all current medications as prescribed.  Your physician recommends that you schedule a follow-up appointment in: 3 months  You have been referred to [facility]. They will contact you to schedule an appointment.  If you have any questions or concerns before your next appointment please send [place] a message through [place] or call our office at [phone].    TO LEAVE A MESSAGE FOR THE NURSE SELECT OPTION 2, PLEASE LEAVE A MESSAGE INCLUDING: . YOUR NAME . DATE OF BIRTH . CALL BACK NUMBER . REASON FOR CALL**this is important as we prioritize the call backs  YOU WILL RECEIVE A CALL BACK THE SAME DAY AS LONG AS YOU CALL BEFORE 4:00 PM   Do the following things EVERYDAY: 1) Weigh yourself in the morning before breakfast. Write it down and keep it in a log. 2) Take your medicines as prescribed 3) Eat low salt foods--Limit salt (sodium) to 2000 mg per day.  4) Stay as active as you can everyday 5) Limit all fluids for the day to less than 2 liters   At the [facility], you and your health needs are our priority. As part of our continuing mission to provide you with exceptional heart care, we have created designated Provider Care Teams. These Care Teams include your primary Cardiologist (physician) and Advanced Practice Providers (APPs- Physician Assistants and Nurse Practitioners) who all work together to provide you with the care you need, when you need it.   You may see any of the following providers on your designated Care Team at your next follow up: Marland Kitchen Dr Glori Bickers . Dr [name] . Darrick Grinder, NP . Lyda Jester, PA . [name], PharmD   Please be sure to bring in all your medications bottles to every appointment.

## 2020-08-11 NOTE — Progress Notes (Signed)
Date:  08/11/2020   ID:  Vicki [name], DOB July 14, 1940, MRN [id number]   Provider location: [place] [facility] Type of Visit: Established patient   PCP:  Deland Pretty, MD  Cardiologist:  Dr. Aundra Dubin   History of Present Illness: Vicki [name] is a 80 y.o. female who has a history of chronic diastolic CHF, pulmonary hypertension and chronic atrial fibrillation.  Patient was followed by Dr. Glade Lloyd in the past for chronic atrial fibrillation.  She has been on coumadin.  She reports progressive exertional dyspnea since 2011.  This gradually worsened and became quite significant over the last few months.  She used to have significant HTN, but more recently her BP has been on the lower side.  Echo was done in 2/14, showing severe concentric LVH with EF 55-60%, moderately dilated RV, moderate to severe TR, and PA systolic pressure 86 mmHg.  I did a right heart cath in 5/14.  This showed PA pressure 104/36 with PCWP 20, suggesting pulmonary arterial HTN well out of proportion to the mildly elevate wedge pressure.  She was already on amlodipine so I did not do vasodilator testing.  V/Q scan was done, showing no evidence for chronic PEs.  PFTs showed a restrictive defect. Cardiac MRI did not show definite evidence for amyloid.  I started her on macitentan 10 mg daily.  Initially, she felt better on macitentan.  However, she was admitted in 5/14 from her sleep study due to orthopnea and dyspnea.  She was diuresed for several days and diuretic was switched over to torsemide.  I next started her on tadalafil 20 mg daily and titrated up to 40 mg daily.  She thinks that this helped.  She saw pulmonary after chest CT (showed mosaic attenuation in lungs).  This was thought to be due to air-trapping rather than ILD.  She was started on Spiriva. She wears oxygen at home.   She had an echocardiogram in 2/15 that showed severe LVH, EF 75%, small pericardial effusion, mildly dilated RV with mildly  decreased systolic function, PA systolic pressure 84 mmHg.  She is not totally sure if she was taking macitentan and tadalafil at that time.  She has had a hard month.  She ran out of her macitentan and tadalafil and her insurance company refused to refill them.  After this, she took a decided turn for the worse.  [place] her [place] in the fall.  She became much more short of breath, just with walking around the house. She developed lightheaded spells, especially with micturation.  Given lightheadedness and presyncope, she was admitted last week.  She was restarted on her medications and she finally got back on her meds at home.  In the hospital, she was noted to be bradycardic with HR to 30s so metoprolol was stopped.  Of note, her abdominal fat pad biopsy did not suggest amyloidosis. Repeat RHC in 3/15 still with moderate to severe PAH and low CI.  Holter off beta blocker in 3/15 showed average HR 73.   Echo in 9/17 showed EF 65-70% with moderate to severe LVH, moderate MR, normal RV size with mildly decreased systolic function, PASP 56 mmHg, small pericardial effusion.   Echo in 10/18 showed EF 60-65% with moderate LVH, moderate MR, severe biatrial enlargement, PASP 62 mmHg, RV normal.  PYP scan in 10/18 was not suggestive of TTR amyloidosis.   Echo in 10/19  showed EF 65-70%, moderate MR, normal RV size and systolic function, PASP 43 mmHg.   Echo in 12/20 showed EF 65-70%, moderate LVH, severe biatrial enlargement, mild-moderate MR, small pericardial effusion, unable to estimate PA systolic pressure.   PYP scan in 12/20 was visually grade 2 with H/CL 1.96 but activity appeared localized to the atria. Invitae gene testing for TTR amyloidosis in 3/21 was negative.   Echo was done today and reviewed, EF 60-65%, moderate LVH, normal RV, PASP 37 mmHg, moderate central MR.   She returns for followup of CHF and pulmonary hypertension. On  Tyvaso, tadalafil, and macitentan for pulmonary hypertension, able to get all meds without problems.  Weight down 4 lbs.  Dyspnea walking up stairs, no dyspnea walking on flat ground.  Mild dyspnea moving her trashcans to the street.  No chest pain.  No lightheadedness.  Occasionally feels palpitations (chronically in atrial fibrillation).   6 minute walk (5/14): 122 m.   6 minute walk (7/14): 152 m 6 minute walk (10/14): 183 m 6 minute walk (4/15): 317 m 6 minute walk (12/15): 259 m 6 minute walk (4/16): 293 m 6 minute walk (8/16): 341 m 6 minute walk (12/16): 274 m 6 minute walk (6/17): 314 m 6 minute walk (9/17): 307 m 6 minute walk (4/18): 366 m 6 minute walk (10/18): 320 m 6 minute walk (3/19): 381 m 6 minute walk (9/19): 366 m 6 minute walk (9/20): 305 m 6 minute walk (12/20): 243 m 6 minute walk (6/21): 304 m 6 minute walk (11/21): 323 m 6 minute walk (2/22): 305 m  Labs (12/13): HCT 45.1, plts 153, K 3.5, creatinine 1.0 Labs (1/14): BNP 849 Labs (4/14): K 3.1, creatinine 1.0, ANA and anti-SCL-70 antibody negative.  Rheumatoid factor negative.  Serum immunofixation did not show monoclonal light chains.  Labs (5/14): K 3.3, creatinine 0.79, proBNP 5147 Labs (6/14): K 4, creatinine 0.9, BNP 1001 Labs (10/14): LDL 53, LDL-P 975, K 3.7, creatinine 1.2 Labs (11/14): K 3.7, creatinine 0.9, BNP 832 Labs (2/15): K 3.5, creatinine 1.10, HCT 42.6, UPEP negative, SPEP negative.  Labs (3/15): K 3.8, creatinine 0.88 Labs (4/15): K 3.7, creatinine 0.99, proBNP 1653 Labs (7/15): K 4, creatinine 0.93 Labs (12/15): LDL 77, HDL 58, K 3.9, creatinine 1.03, LFTs normal Labs (4/16): K 3.8, creatinine 0.93, BNP 475, plts 105, HCT 42.3 Labs (11/16): K 3.6, creatinine 1.1, HCT 42.2 Labs (12/16): LDL 54, HDL 50, BNP 628 Labs (6/17): K 3.7, creatinine 0.97, HCT 41.3, BNP 489 Labs (9/17): K 3.8, creatinine 0.97, LDL 65, HDL 54 Labs (9/18): K 4.1, creatinine 1.05, hgb 14.6, BNP 646 Labs (10/18):  BNP 588, TSH normal, LDL 73, HDL 48, K 3.5, creatinine 0.97, hgb 12.5, plts 91, LFTs normal Labs (3/19): K 4.1, creatinine 1.07, hgb 13.2 Labs (9/19): K 3.8, creatinine 1.1 Labs (7/20): K 3.9, creatinine 1.3, LDL 63 Labs (9/20): K 4.2, creatinine 1.05, hgb 12.6, BNP 632  Labs (12/20): Myeloma panel negative, urine immunofixation negative.  Labs (1/21): K 4.3, creatinine 1.12, LDL 63 Labs (3/21): K 3.8, creatinine 1.13 Labs (6/21): K 3.9, creatinine 1.16 Labs (11/21): LDL 63, K 4.1, creatinine 1.19  PMH: 1. Chronic diastolic CHF: Echo (9/56) with EF 55-60%, severe LVH (no SAM, no asymmetric hypertrophy, no LVOT gradient), moderate-severe LAE, moderately dilated RV with mildly decreased systolic function, moderate to severe RAE, PA systolic pressure 86 mmHg, moderate-severe TR, moderate MR, trivial pericardial effusion.  Cardiac MRI (5/14): EF 65%, severe LVH, no definite evidence for  amyloidosis (no delayed enhancement, myocardium not difficult to null).  Echo (2/15) with EF 75%, severe LVH, grade II diastolic dysfunction, moderate MR, RV mildly dilated with mildly decreased systolic function, moderate TR, PA systolic pressure 84 mmHg, small pericardial effusion. Abdominal fat pad biopsy (2/15) showed no evidence for amyloidosis.  SPEP/UPEP negative 2/15.  - Echo (4/16) with EF 60-65%, severe LVH, RV mildly dilated with mildly decreased systolic function, PA systolic pressure 40 mmHg. - Echo (9/17) with EF 65-70%, severe LVH, moderate MR, normal RV size with mildly decreased systolic function, PASP 56 mmHg, small pericardial effusion.  - Echo (10/18): EF 60-65% with moderate LVH, moderate MR, severe biatrial enlargement, PASP 62 mmHg, RV normal. - PYP scan (10/18) was not suggestive of TTR amyloidosis.  - Echo (10/19): EF 65-70%, moderate MR, normal RV size and systolic function, PASP 43 mmHg. - Echo (12/20): EF 65-70%, moderate LVH, severe biatrial enlargement, mild-moderate MR, small pericardial  effusion, unable to estimate PA systolic pressure.  - PYP scan (12/20): grade 2 with H/CL 1.96 but activity localized to the atria.  - Inv[employer]ae gene testing for TTR amyloidosis in 3/21 was negative.  - Echo (2/22): EF 60-65%, moderate LVH, normal RV, PASP 37 mmHg, moderate central MR. 2. Chronic atrial fibrillation since around 2004.  Developed bradycardia and metoprolol stopped 2/15. Holter (3/15) with average HR 73, atrial fibrillation, 3.8 sec pause x 1 while asleep, PVCs.  3. HTN: For decades.  4. LHC (2/08) with no significant disease.  5. Chronic thrombocytopenia: ITP 6. Pulmonary arterial HTN: RHC (5/14) with mean RA 13, PA 104/36 (mean 63), mean PCWP 20 on right and 23 on left, CI 2.3 (Fick) and 1.6 (thermo), PVR 10.4 WU (Fick) and 15 WU (thermo).  Vasodilator testing not done as patient was already on amlodipine.  V/Q scan (5/14) with no evidence for chronic PE.  ANA, RF, and anti-SCL70 antibody negative.  PFTs (5/14) with FEV1 60%, FVC 54%, ratio 112%, TLC 61%, DLCO 43% => restrictive defect.  Sleep study (7/14) with no OSA.  CT chest with areas of mosaic attenuation in lungs (saw pulmonary, thought air trapping and not ILD). RHC (3/15) with RA mean 5, PA 66/31 mean 43, PCWP mean 11, Cardiac Index (Fick) 1.97, PVR 9.2 WU.  Patient was started on Tyvaso in 3/15.  Echo (4/16) with mildly dilated and mildly dysfunctional RV, PA systolic pressure 40 mmHg.  7. Chest pain: Cardiolite 11/21 with no ischemia.   Current Outpatient Medications  Medication Sig Dispense Refill  . acetaminophen (TYLENOL) 500 MG tablet Take 500 mg by mouth every 6 (six) hours as needed for mild pain or headache.     Marland Kitchen amLODipine (NORVASC) 2.5 MG tablet TAKE ONE TABLET BY MOUTH ONCE DAILY 30 tablet 0  . Cholecalciferol (VITAMIN D3) 2000 units TABS Take 1 tablet by mouth daily.     Marland Kitchen KLOR-CON M20 20 MEQ tablet TAKE TWO TABLETS BY MOUTH IN THE MORNING AND ONE IN THE EVENING 90 tablet 0  . macitentan (OPSUMIT) 10 MG tablet  TAKE 1 TABLET (10 MG TOTAL) BY MOUTH DAILY WITH BREAKFAST. 90 tablet 3  . rosuvastatin (CRESTOR) 20 MG tablet Take 20 mg by mouth at bedtime.     . tadalafil, PAH, (ADCIRCA) 20 MG tablet Take 2 tablets (40 mg total) by mouth daily. 180 tablet 2  . torsemide (DEMADEX) 20 MG tablet Take 1.5 tablets (30 mg total) by mouth 2 (two) times daily. 90 tablet 6  . Treprostinil (TYVASO) 0.6 MG/ML  SOLN Inhale 54 mcg into the lungs 4 (four) times daily. 30 mL 5  . warfarin (COUMADIN) 5 MG tablet Take one tablet by mouth daily or AS DIRECTED BY  COUMADIN  CLINIC 95 tablet 0   No current facility-administered medications for this encounter.    Allergies:   Patient has no known allergies.   Social History:  The patient  reports that she has never smoked. She has never used smokeless tobacco. She reports current alcohol use of about 3.0 standard drinks of alcohol per week. She reports that she does not use drugs.   Family History:  The patient's family history includes Alzheimer's disease in her mother; Heart Problems in her brother and maternal aunt; Heart attack in her father; Hypertension in her brother; Pulmonary Hypertension in her cousin; Thyroid disease in her daughter, daughter, and maternal aunt.   ROS:  Please see the history of present illness.   All other systems are personally reviewed and negative.   Exam:   BP 122/64   Pulse 60   Wt 73.8 kg (162 lb 12.8 oz)   SpO2 94%   BMI 29.78 kg/m   General: NAD Neck: No JVD, no thyromegaly or thyroid nodule.  Lungs: Clear to auscultation bilaterally with normal respiratory effort. CV: Nondisplaced PMI.  Heart irregular S1/S2, no S3/S4, 2/6 early SEM RUSB.  No peripheral edema.  No carotid bruit.  Normal pedal pulses.  Abdomen: Soft, nontender, no hepatosplenomegaly, no distention.  Skin: Intact without lesions or rashes.  Neurologic: Alert and oriented x 3.  Psych: Normal affect. Extremities: No clubbing or cyanosis.  HEENT: Normal.   Recent  Labs: 08/09/2020: B Natriuretic Peptide 914.9; BUN 18; Creatinine, Ser 1.14; Hemoglobin 12.5; Platelets 72; Potassium 4.3; Sodium 143  Personally reviewed   Wt Readings from Last 3 Encounters:  08/09/20 73.8 kg (162 lb 12.8 oz)  05/28/20 75.3 kg (166 lb)  05/06/20 75.4 kg (166 lb 3.2 oz)    ASSESSMENT AND PLAN:  1. Pulmonary HTN: Patient has severe pulmonary arterial HTN.  RHC in 3/15 showed CI 1.97, PVR 9. 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 Collagen vascular disease workup was negative (negative RF, ANA and negative anti-SCL-70).  V/Q scan was not suggestive of chronic PEs.  PFTs were suggestive of restrictive lung disease but CT chest and evaluation by pulmonary did not suggest interstitial lung disease.  Sleep study did not show OSA.  Echo today showed normal RV size and systolic function, PASP estimation 37 mmHg.  She is stable symptomatically, stable 6 minute walk today.  - Continue oxygen at night and with exertion as needed (has not needed during the day).  - Continue Tyvaso, macitentan, Adcirca.  She is doing well on this regimen and is no longer having trouble getting her meds. - BNP today.  2. Chronic diastolic CHF: EF preserved on echo with moderate concentric LVH and a small pericardial effusion.  It is possible that the LVH is due to years of HTN.  The cardiac MRI was not definitively suggestive of cardiac amyloidosis.  I was still concerned for amyloidosis given the appearance of the LV myocardium on echoes.  Abdominal fat pad biopsy in 2015 showed no evidence for amyloidosis. She has some symptoms suggestive of mild peripheral neuropathy.  PYP scan in 10/18 was not suggestive of TTR amyloidosis. I assessed her again for cardiac amyloidosis in 2020: myeloma panel and urine immunofixation negative, PYP scan in 12/20 was grade 2 with H/CL 1.97 but activity was localized  to the atria.  [employer]ae gene testing for TTR amyloidosis was negative.  NYHA class II symptoms.  She is  not volume overloaded.  - I have reviewed her PYP scan with colleagues, unusual pattern with uptake localized primarily to the atria and not the ventricles.  She does not think that she could do an MRI again (very claustrophobic the first time).  I am going to refer her to the amyloidosis clinic at [facility] for another opinion on treatment here => should we treat her for TTR cardiac amyloidosis?  - Continue torsemide 30 mg bid. BMET today.   3. HTN: BP is controlled.  4. Chronic atrial fibrillation: Continue coumadin.  She is off metoprolol due to bradycardia. Holter off metoprolol did not show any high rate episodes.  5. Hyperlipidemia: On Crestor, good lipids in 11/21.   Refer to [facility] amyloidosis clinic as above and followup with me in 3 months.   Signed, [name], MD  08/11/2020  [facility] [address] [facility] [place] [address] [phone] (office) [phone] (fax)

## 2020-08-26 DIAGNOSIS — I509 Heart failure, unspecified: Secondary | ICD-10-CM | POA: Diagnosis not present

## 2020-09-02 ENCOUNTER — Other Ambulatory Visit: Payer: Self-pay | Admitting: Cardiology

## 2020-09-12 ENCOUNTER — Other Ambulatory Visit (HOSPITAL_COMMUNITY): Payer: Self-pay | Admitting: Cardiology

## 2020-09-12 DIAGNOSIS — I272 Pulmonary hypertension, unspecified: Secondary | ICD-10-CM

## 2020-09-18 ENCOUNTER — Other Ambulatory Visit (HOSPITAL_COMMUNITY): Payer: Self-pay

## 2020-09-18 DIAGNOSIS — Z9861 Coronary angioplasty status: Secondary | ICD-10-CM

## 2020-09-18 DIAGNOSIS — I4891 Unspecified atrial fibrillation: Secondary | ICD-10-CM

## 2020-09-18 MED ORDER — OPSUMIT 10 MG PO TABS: ORAL_TABLET | ORAL | 3 refills | Status: DC

## 2020-09-23 DIAGNOSIS — I509 Heart failure, unspecified: Secondary | ICD-10-CM | POA: Diagnosis not present

## 2020-10-01 ENCOUNTER — Ambulatory Visit: Payer: Medicare HMO

## 2020-10-01 ENCOUNTER — Other Ambulatory Visit: Payer: Self-pay

## 2020-10-01 DIAGNOSIS — Z5181 Encounter for therapeutic drug level monitoring: Secondary | ICD-10-CM | POA: Diagnosis not present

## 2020-10-01 DIAGNOSIS — I4891 Unspecified atrial fibrillation: Secondary | ICD-10-CM

## 2020-10-01 LAB — POCT INR: INR: 2.3 (ref 2.0–3.0)

## 2020-10-01 NOTE — Patient Instructions (Signed)
Description   Continue taking Warfarin 1 tablet every day. Recheck in 8 weeks. Call with any medications changes [phone].     

## 2020-10-10 DIAGNOSIS — I10 Essential (primary) hypertension: Secondary | ICD-10-CM | POA: Diagnosis not present

## 2020-10-16 DIAGNOSIS — Z9981 Dependence on supplemental oxygen: Secondary | ICD-10-CM | POA: Diagnosis not present

## 2020-10-16 DIAGNOSIS — D696 Thrombocytopenia, unspecified: Secondary | ICD-10-CM | POA: Diagnosis not present

## 2020-10-16 DIAGNOSIS — E7800 Pure hypercholesterolemia, unspecified: Secondary | ICD-10-CM | POA: Diagnosis not present

## 2020-10-16 DIAGNOSIS — Z0000 Encounter for general adult medical examination without abnormal findings: Secondary | ICD-10-CM | POA: Diagnosis not present

## 2020-10-16 DIAGNOSIS — I10 Essential (primary) hypertension: Secondary | ICD-10-CM | POA: Diagnosis not present

## 2020-10-16 DIAGNOSIS — N1831 Chronic kidney disease, stage 3a: Secondary | ICD-10-CM | POA: Diagnosis not present

## 2020-10-16 DIAGNOSIS — D72819 Decreased white blood cell count, unspecified: Secondary | ICD-10-CM | POA: Diagnosis not present

## 2020-10-16 DIAGNOSIS — I5032 Chronic diastolic (congestive) heart failure: Secondary | ICD-10-CM | POA: Diagnosis not present

## 2020-10-16 DIAGNOSIS — D6869 Other thrombophilia: Secondary | ICD-10-CM | POA: Diagnosis not present

## 2020-10-16 DIAGNOSIS — I482 Chronic atrial fibrillation, unspecified: Secondary | ICD-10-CM | POA: Diagnosis not present

## 2020-10-18 ENCOUNTER — Telehealth (HOSPITAL_COMMUNITY): Payer: Self-pay | Admitting: Cardiology

## 2020-10-18 NOTE — Telephone Encounter (Signed)
Referral to [facility] amyloidosis clinic refaxed to [phone] as patient has not heard from office

## 2020-10-24 DIAGNOSIS — I509 Heart failure, unspecified: Secondary | ICD-10-CM | POA: Diagnosis not present

## 2020-10-26 DIAGNOSIS — I482 Chronic atrial fibrillation, unspecified: Secondary | ICD-10-CM | POA: Diagnosis not present

## 2020-10-26 DIAGNOSIS — N1831 Chronic kidney disease, stage 3a: Secondary | ICD-10-CM | POA: Diagnosis not present

## 2020-10-26 DIAGNOSIS — I130 Hypertensive heart and chronic kidney disease with heart failure and stage 1 through stage 4 chronic kidney disease, or unspecified chronic kidney disease: Secondary | ICD-10-CM | POA: Diagnosis not present

## 2020-10-26 DIAGNOSIS — I5032 Chronic diastolic (congestive) heart failure: Secondary | ICD-10-CM | POA: Diagnosis not present

## 2020-11-06 DIAGNOSIS — H35071 Retinal telangiectasis, right eye: Secondary | ICD-10-CM | POA: Diagnosis not present

## 2020-11-06 DIAGNOSIS — H401121 Primary open-angle glaucoma, left eye, mild stage: Secondary | ICD-10-CM | POA: Diagnosis not present

## 2020-11-06 DIAGNOSIS — H401112 Primary open-angle glaucoma, right eye, moderate stage: Secondary | ICD-10-CM | POA: Diagnosis not present

## 2020-11-06 DIAGNOSIS — H25812 Combined forms of age-related cataract, left eye: Secondary | ICD-10-CM | POA: Diagnosis not present

## 2020-11-06 DIAGNOSIS — Z961 Presence of intraocular lens: Secondary | ICD-10-CM | POA: Diagnosis not present

## 2020-11-06 DIAGNOSIS — H35351 Cystoid macular degeneration, right eye: Secondary | ICD-10-CM | POA: Diagnosis not present

## 2020-11-15 ENCOUNTER — Ambulatory Visit (HOSPITAL_COMMUNITY)
Admission: RE | Admit: 2020-11-15 | Discharge: 2020-11-15 | Disposition: A | Discharge status: Home / Self Care | Payer: Medicare HMO | Source: Ambulatory Visit | Attending: Cardiology | Admitting: Cardiology

## 2020-11-15 ENCOUNTER — Encounter (HOSPITAL_COMMUNITY): Payer: Self-pay | Admitting: Cardiology

## 2020-11-15 ENCOUNTER — Other Ambulatory Visit: Payer: Self-pay

## 2020-11-15 VITALS — BP 120/60 | HR 51 | Wt 161.8 lb

## 2020-11-15 DIAGNOSIS — I2721 Secondary pulmonary arterial hypertension: Secondary | ICD-10-CM | POA: Diagnosis not present

## 2020-11-15 DIAGNOSIS — Z7901 Long term (current) use of anticoagulants: Secondary | ICD-10-CM | POA: Insufficient documentation

## 2020-11-15 DIAGNOSIS — I482 Chronic atrial fibrillation, unspecified: Secondary | ICD-10-CM | POA: Diagnosis not present

## 2020-11-15 DIAGNOSIS — I5032 Chronic diastolic (congestive) heart failure: Secondary | ICD-10-CM | POA: Diagnosis not present

## 2020-11-15 DIAGNOSIS — Z79899 Other long term (current) drug therapy: Secondary | ICD-10-CM | POA: Diagnosis not present

## 2020-11-15 DIAGNOSIS — Z9981 Dependence on supplemental oxygen: Secondary | ICD-10-CM | POA: Diagnosis not present

## 2020-11-15 DIAGNOSIS — R0609 Other forms of dyspnea: Secondary | ICD-10-CM | POA: Insufficient documentation

## 2020-11-15 DIAGNOSIS — Z13220 Encounter for screening for lipoid disorders: Secondary | ICD-10-CM

## 2020-11-15 DIAGNOSIS — I272 Pulmonary hypertension, unspecified: Secondary | ICD-10-CM

## 2020-11-15 DIAGNOSIS — Z8249 Family history of ischemic heart disease and other diseases of the circulatory system: Secondary | ICD-10-CM | POA: Insufficient documentation

## 2020-11-15 DIAGNOSIS — E854 Organ-limited amyloidosis: Secondary | ICD-10-CM | POA: Diagnosis not present

## 2020-11-15 DIAGNOSIS — I43 Cardiomyopathy in diseases classified elsewhere: Secondary | ICD-10-CM | POA: Diagnosis not present

## 2020-11-15 DIAGNOSIS — I313 Pericardial effusion (noninflammatory): Secondary | ICD-10-CM | POA: Insufficient documentation

## 2020-11-15 DIAGNOSIS — E785 Hyperlipidemia, unspecified: Secondary | ICD-10-CM | POA: Insufficient documentation

## 2020-11-15 DIAGNOSIS — I110 Hypertensive heart disease with heart failure: Secondary | ICD-10-CM | POA: Insufficient documentation

## 2020-11-15 LAB — LIPID PANEL
Cholesterol: 139 mg/dL (ref 0–200)
HDL: 58 mg/dL (ref >40)
LDL Cholesterol: 65 mg/dL (ref 0–99)
Total CHOL/HDL Ratio: 2.4 RATIO
Triglycerides: 79 mg/dL (ref <150)
VLDL: 16 mg/dL (ref 0–40)

## 2020-11-15 LAB — BASIC METABOLIC PANEL
Anion gap: 5 (ref 5–15)
BUN: 12 mg/dL (ref 8–23)
CO2: 30 mmol/L (ref 22–32)
Calcium: 9.1 mg/dL (ref 8.9–10.3)
Chloride: 105 mmol/L (ref 98–111)
Creatinine, Ser: 1.22 mg/dL — ABNORMAL HIGH (ref 0.44–1.00)
GFR, Estimated: 45 mL/min — ABNORMAL LOW (ref >60)
Glucose, Bld: 93 mg/dL (ref 70–99)
Potassium: 4 mmol/L (ref 3.5–5.1)
Sodium: 140 mmol/L (ref 135–145)

## 2020-11-15 LAB — BRAIN NATRIURETIC PEPTIDE: B Natriuretic Peptide: 656.2 pg/mL — ABNORMAL HIGH (ref 0.0–100.0)

## 2020-11-15 NOTE — Progress Notes (Signed)
6 Min Walk Test Completed  Pt ambulated 365.7 meters O2 Sat ranged 95%-99% on 4L oxygen HR ranged 41-87

## 2020-11-15 NOTE — Patient Instructions (Signed)
Labs done today, your results will be available in MyChart, we will contact you for abnormal readings.  You have been ordered a PYP Scan.  This is done in the Radiology Department of [facility].  When you come for this test please plan to be there 2-3 hours.  Your physician recommends that you schedule a follow-up appointment in: 3 months  If you have any questions or concerns before your next appointment please send [place] a message through [place] or call our office at [phone].    TO LEAVE A MESSAGE FOR THE NURSE SELECT OPTION 2, PLEASE LEAVE A MESSAGE INCLUDING: . YOUR NAME . DATE OF BIRTH . CALL BACK NUMBER . REASON FOR CALL**this is important as we prioritize the call backs  [place] AS LONG AS YOU CALL BEFORE 4:00 PM  At the [facility], you and your health needs are our priority. As part of our continuing mission to provide you with exceptional heart care, we have created designated Provider Care Teams. These Care Teams include your primary Cardiologist (physician) and Advanced Practice Providers (APPs- Physician Assistants and Nurse Practitioners) who all work together to provide you with the care you need, when you need it.   You may see any of the following providers on your designated Care Team at your next follow up: Marland Kitchen Dr Glori Bickers . Dr [name] . Dr Vickki Muff . Darrick Grinder, NP . Lyda Jester, [place] . [name], PharmD   Please be sure to bring in all your medications bottles to every appointment.

## 2020-11-17 NOTE — Progress Notes (Signed)
Date:  11/17/2020   ID:  Vicki [name], DOB 06/27/1941, MRN [id number]   Provider location: [facility] Type of Visit: Established patient   PCP:  Deland Pretty, MD  Cardiologist:  Dr. Aundra Dubin   History of Present Illness: Vicki [name] is a 80 y.o. female who has a history of chronic diastolic CHF, pulmonary hypertension and chronic atrial fibrillation.  Patient was followed by Dr. Glade Lloyd in the past for chronic atrial fibrillation.  She has been on coumadin.  She reports progressive exertional dyspnea since 2011.  This gradually worsened and became quite significant over the last few months.  She used to have significant HTN, but more recently her BP has been on the lower side.  Echo was done in 2/14, showing severe concentric LVH with EF 55-60%, moderately dilated RV, moderate to severe TR, and PA systolic pressure 86 mmHg.  I did a right heart cath in 5/14.  This showed PA pressure 104/36 with PCWP 20, suggesting pulmonary arterial HTN well out of proportion to the mildly elevate wedge pressure.  She was already on amlodipine so I did not do vasodilator testing.  V/Q scan was done, showing no evidence for chronic PEs.  PFTs showed a restrictive defect. Cardiac MRI did not show definite evidence for amyloid.  I started her on macitentan 10 mg daily.  Initially, she felt better on macitentan.  However, she was admitted in 5/14 from her sleep study due to orthopnea and dyspnea.  She was diuresed for several days and diuretic was switched over to torsemide.  I next started her on tadalafil 20 mg daily and titrated up to 40 mg daily.  She thinks that this helped.  She saw pulmonary after chest CT (showed mosaic attenuation in lungs).  This was thought to be due to air-trapping rather than ILD.  She was started on Spiriva. She wears oxygen at home.   She had an echocardiogram in 2/15 that showed severe LVH, EF 75%, small pericardial effusion, mildly dilated RV with mildly  decreased systolic function, PA systolic pressure 84 mmHg.  She is not totally sure if she was taking macitentan and tadalafil at that time.  She has had a hard month.  She ran out of her macitentan and tadalafil and her insurance company refused to refill them.  After this, she took a decided turn for the worse.  [place] her [place] in the fall.  She became much more short of breath, just with walking around the house. She developed lightheaded spells, especially with micturation.  Given lightheadedness and presyncope, she was admitted last week.  She was restarted on her medications and she finally got back on her meds at home.  In the hospital, she was noted to be bradycardic with HR to 30s so metoprolol was stopped.  Of note, her abdominal fat pad biopsy did not suggest amyloidosis. Repeat RHC in 3/15 still with moderate to severe PAH and low CI.  Holter off beta blocker in 3/15 showed average HR 73.   Echo in 9/17 showed EF 65-70% with moderate to severe LVH, moderate MR, normal RV size with mildly decreased systolic function, PASP 56 mmHg, small pericardial effusion.   Echo in 10/18 showed EF 60-65% with moderate LVH, moderate MR, severe biatrial enlargement, PASP 62 mmHg, RV normal.  PYP scan in 10/18 was not suggestive of TTR amyloidosis.   Echo in 10/19  showed EF 65-70%, moderate MR, normal RV size and systolic function, PASP 43 mmHg.   Echo in 12/20 showed EF 65-70%, moderate LVH, severe biatrial enlargement, mild-moderate MR, small pericardial effusion, unable to estimate PA systolic pressure.   PYP scan in 12/20 was visually grade 2 with H/CL 1.96 but activity appeared localized to the atria. [employer]ae gene testing for TTR amyloidosis in 3/21 was negative.   Echo in 2/22 showed EF 60-65%, moderate LVH, normal RV, PASP 37 mmHg, moderate central MR.   She returns for followup of CHF and pulmonary hypertension. On Tyvaso,  tadalafil, and macitentan for pulmonary hypertension, able to get all meds without problems.  Weight down 1 lb.  No dyspnea on flat ground, able to get up a flight of stairs.  Walks for exercise.  No chest pain.  No lightheadedness.    ECG (personally reviewed): atrial fibrillation, LVH, rate 47    6 minute walk (5/14): 122 m.   6 minute walk (7/14): 152 m 6 minute walk (10/14): 183 m 6 minute walk (4/15): 317 m 6 minute walk (12/15): 259 m 6 minute walk (4/16): 293 m 6 minute walk (8/16): 341 m 6 minute walk (12/16): 274 m 6 minute walk (6/17): 314 m 6 minute walk (9/17): 307 m 6 minute walk (4/18): 366 m 6 minute walk (10/18): 320 m 6 minute walk (3/19): 381 m 6 minute walk (9/19): 366 m 6 minute walk (9/20): 305 m 6 minute walk (12/20): 243 m 6 minute walk (6/21): 304 m 6 minute walk (11/21): 323 m 6 minute walk (2/22): 305 m 6 minute walk (5/22): 366 m  Labs (12/13): HCT 45.1, plts 153, K 3.5, creatinine 1.0 Labs (1/14): BNP 849 Labs (4/14): K 3.1, creatinine 1.0, ANA and anti-SCL-70 antibody negative.  Rheumatoid factor negative.  Serum immunofixation did not show monoclonal light chains.  Labs (5/14): K 3.3, creatinine 0.79, proBNP 5147 Labs (6/14): K 4, creatinine 0.9, BNP 1001 Labs (10/14): LDL 53, LDL-P 975, K 3.7, creatinine 1.2 Labs (11/14): K 3.7, creatinine 0.9, BNP 832 Labs (2/15): K 3.5, creatinine 1.10, HCT 42.6, UPEP negative, SPEP negative.  Labs (3/15): K 3.8, creatinine 0.88 Labs (4/15): K 3.7, creatinine 0.99, proBNP 1653 Labs (7/15): K 4, creatinine 0.93 Labs (12/15): LDL 77, HDL 58, K 3.9, creatinine 1.03, LFTs normal Labs (4/16): K 3.8, creatinine 0.93, BNP 475, plts 105, HCT 42.3 Labs (11/16): K 3.6, creatinine 1.1, HCT 42.2 Labs (12/16): LDL 54, HDL 50, BNP 628 Labs (6/17): K 3.7, creatinine 0.97, HCT 41.3, BNP 489 Labs (9/17): K 3.8, creatinine 0.97, LDL 65, HDL 54 Labs (9/18): K 4.1, creatinine 1.05, hgb 14.6, BNP 646 Labs (10/18): BNP 588, TSH  normal, LDL 73, HDL 48, K 3.5, creatinine 0.97, hgb 12.5, plts 91, LFTs normal Labs (3/19): K 4.1, creatinine 1.07, hgb 13.2 Labs (9/19): K 3.8, creatinine 1.1 Labs (7/20): K 3.9, creatinine 1.3, LDL 63 Labs (9/20): K 4.2, creatinine 1.05, hgb 12.6, BNP 632  Labs (12/20): Myeloma panel negative, urine immunofixation negative.  Labs (1/21): K 4.3, creatinine 1.12, LDL 63 Labs (3/21): K 3.8, creatinine 1.13 Labs (6/21): K 3.9, creatinine 1.16 Labs (11/21): LDL 63, K 4.1, creatinine 1.19 Labs (2/22): K 4.3, creatinine 1.14, hgb 11.5, BNP 915  PMH: 1. Chronic diastolic CHF: Echo (8/24) with EF 55-60%, severe LVH (no SAM, no asymmetric hypertrophy, no LVOT gradient), moderate-severe LAE, moderately dilated RV with mildly decreased systolic function, moderate to severe RAE, PA systolic pressure 86 mmHg, moderate-severe TR, moderate  MR, trivial pericardial effusion.  Cardiac MRI (5/14): EF 65%, severe LVH, no definite evidence for amyloidosis (no delayed enhancement, myocardium not difficult to null).  Echo (2/15) with EF 75%, severe LVH, grade II diastolic dysfunction, moderate MR, RV mildly dilated with mildly decreased systolic function, moderate TR, PA systolic pressure 84 mmHg, small pericardial effusion. Abdominal fat pad biopsy (2/15) showed no evidence for amyloidosis.  SPEP/UPEP negative 2/15.  - Echo (4/16) with EF 60-65%, severe LVH, RV mildly dilated with mildly decreased systolic function, PA systolic pressure 40 mmHg. - Echo (9/17) with EF 65-70%, severe LVH, moderate MR, normal RV size with mildly decreased systolic function, PASP 56 mmHg, small pericardial effusion.  - Echo (10/18): EF 60-65% with moderate LVH, moderate MR, severe biatrial enlargement, PASP 62 mmHg, RV normal. - PYP scan (10/18) was not suggestive of TTR amyloidosis.  - Echo (10/19): EF 65-70%, moderate MR, normal RV size and systolic function, PASP 43 mmHg. - Echo (12/20): EF 65-70%, moderate LVH, severe biatrial  enlargement, mild-moderate MR, small pericardial effusion, unable to estimate PA systolic pressure.  - PYP scan (12/20): grade 2 with H/CL 1.96 but activity localized to the atria.  - [employer]ae gene testing for TTR amyloidosis in 3/21 was negative.  - Echo (2/22): EF 60-65%, moderate LVH, normal RV, PASP 37 mmHg, moderate central MR. 2. Chronic atrial fibrillation since around 2004.  Developed bradycardia and metoprolol stopped 2/15. Holter (3/15) with average HR 73, atrial fibrillation, 3.8 sec pause x 1 while asleep, PVCs.  3. HTN: For decades.  4. LHC (2/08) with no significant disease.  5. Chronic thrombocytopenia: ITP 6. Pulmonary arterial HTN: RHC (5/14) with mean RA 13, PA 104/36 (mean 63), mean PCWP 20 on right and 23 on left, CI 2.3 (Fick) and 1.6 (thermo), PVR 10.4 WU (Fick) and 15 WU (thermo).  Vasodilator testing not done as patient was already on amlodipine.  V/Q scan (5/14) with no evidence for chronic PE.  ANA, RF, and anti-SCL70 antibody negative.  PFTs (5/14) with FEV1 60%, FVC 54%, ratio 112%, TLC 61%, DLCO 43% => restrictive defect.  Sleep study (7/14) with no OSA.  CT chest with areas of mosaic attenuation in lungs (saw pulmonary, thought air trapping and not ILD). RHC (3/15) with RA mean 5, PA 66/31 mean 43, PCWP mean 11, Cardiac Index (Fick) 1.97, PVR 9.2 WU.  Patient was started on Tyvaso in 3/15.  Echo (4/16) with mildly dilated and mildly dysfunctional RV, PA systolic pressure 40 mmHg.  7. Chest pain: Cardiolite 11/21 with no ischemia.   Current Outpatient Medications  Medication Sig Dispense Refill  . acetaminophen (TYLENOL) 500 MG tablet Take 500 mg by mouth every 6 (six) hours as needed for mild pain or headache.     Marland Kitchen amLODipine (NORVASC) 2.5 MG tablet TAKE ONE TABLET BY MOUTH ONCE DAILY 30 tablet 0  . Cholecalciferol (VITAMIN D3) 2000 units TABS Take 1 tablet by mouth daily.     Marland Kitchen KLOR-CON M20 20 MEQ tablet TAKE TWO TABLETS BY MOUTH IN THE MORNING AND ONE IN THE EVENING 90  tablet 0  . levocetirizine (XYZAL) 5 MG tablet Take 2.5 mg by mouth daily in the afternoon.    . macitentan (OPSUMIT) 10 MG tablet TAKE 1 TABLET (10 MG TOTAL) BY MOUTH DAILY WITH BREAKFAST. 90 tablet 3  . rosuvastatin (CRESTOR) 20 MG tablet Take 20 mg by mouth at bedtime.     . tadalafil, PAH, (ADCIRCA) 20 MG tablet TAKE 2 TABLETS DAILY. GENERIC FOR  ADCIRCA. 60 tablet 3  . torsemide (DEMADEX) 20 MG tablet Take 1.5 tablets (30 mg total) by mouth 2 (two) times daily. 90 tablet 6  . Treprostinil (TYVASO) 0.6 MG/ML SOLN Inhale 54 mcg into the lungs 4 (four) times daily. 30 mL 5  . warfarin (COUMADIN) 5 MG tablet TAKE 1 TABLET BY MOUTH AS DIRECTED BY  COUMADIN  CLINIC 95 tablet 0   No current facility-administered medications for this encounter.    Allergies:   Patient has no known allergies.   Social History:  The patient  reports that she has never smoked. She has never used smokeless tobacco. She reports current alcohol use of about 3.0 standard drinks of alcohol per week. She reports that she does not use drugs.   Family History:  The patient's family history includes Alzheimer's disease in her mother; Heart Problems in her brother and maternal aunt; Heart attack in her father; Hypertension in her brother; Pulmonary Hypertension in her cousin; Thyroid disease in her daughter, daughter, and maternal aunt.   ROS:  Please see the history of present illness.   All other systems are personally reviewed and negative.   Exam:   BP 120/60   Pulse (!) 51   Wt 73.4 kg (161 lb 12.8 oz)   SpO2 96%   BMI 29.59 kg/m   General: NAD Neck: No JVD, no thyromegaly or thyroid nodule.  Lungs: Clear to auscultation bilaterally with normal respiratory effort. CV: Nondisplaced PMI.  Heart irregular S1/S2, no S3/S4, no murmur.  No peripheral edema.  No carotid bruit.  Normal pedal pulses.  Abdomen: Soft, nontender, no hepatosplenomegaly, no distention.  Skin: Intact without lesions or rashes.  Neurologic:  Alert and oriented x 3.  Psych: Normal affect. Extremities: No clubbing or cyanosis.  HEENT: Normal.   Recent Labs: 08/09/2020: Hemoglobin 12.5; Platelets 72 11/15/2020: B Natriuretic Peptide 656.2; BUN 12; Creatinine, Ser 1.22; Potassium 4.0; Sodium 140  Personally reviewed   Wt Readings from Last 3 Encounters:  11/15/20 73.4 kg (161 lb 12.8 oz)  08/09/20 73.8 kg (162 lb 12.8 oz)  05/28/20 75.3 kg (166 lb)    ASSESSMENT AND PLAN:  1. Pulmonary HTN: Patient has severe pulmonary arterial HTN.  RHC in 3/15 showed CI 1.97, PVR 9. 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 Collagen vascular disease workup was negative (negative RF, ANA and negative anti-SCL-70).  V/Q scan was not suggestive of chronic PEs.  PFTs were suggestive of restrictive lung disease but CT chest and evaluation by pulmonary did not suggest interstitial lung disease.  Sleep study did not show OSA.  Echo in 3/22 showed normal RV size and systolic function, PASP estimation 37 mmHg.  She is stable symptomatically, increased 6 minute walk today.  - Continue oxygen at night and with exertion as needed (has not needed during the day).  - Continue Tyvaso, macitentan, Adcirca.  She is doing well on this regimen and is no longer having trouble getting her meds. - BNP today.  2. Chronic diastolic CHF: EF preserved on echo with moderate concentric LVH and a small pericardial effusion.  It is possible that the LVH is due to years of HTN.  The cardiac MRI was not definitively suggestive of cardiac amyloidosis.  I was still concerned for amyloidosis given the appearance of the LV myocardium on echoes.  Abdominal fat pad biopsy in 2015 showed no evidence for amyloidosis. She has some symptoms suggestive of mild peripheral neuropathy.  PYP scan in 10/18 was not suggestive of  TTR amyloidosis. I assessed her again for cardiac amyloidosis in 2020: myeloma panel and urine immunofixation negative, PYP scan in 12/20 was grade 2 with H/CL 1.97  but activity was localized to the atria.  [facility]ae gene testing for TTR amyloidosis was negative.  NYHA class II symptoms.  She is not volume overloaded.  - I have reviewed her PYP scan with colleagues, unusual pattern with uptake localized primarily to the atria and not the ventricles.  She does not think that she could do an MRI again (very claustrophobic the first time).  I referred her to the amyloidosis clinic at [facility] for another opinion on treatment but they have never got her in.  I will repeat PYP scan (has been > 1 year) to see if uptake is progressive in the LV.  If so, she will need tafamidis.   - Continue torsemide 30 mg bid. BMET today.   3. HTN: BP is controlled.  4. Chronic atrial fibrillation: Continue coumadin.  She is off metoprolol due to bradycardia. Holter off metoprolol did not show any high rate episodes.  5. Hyperlipidemia: On Crestor, good lipids in 11/21.   Followup in 3 months.   Signed, [name], MD  11/17/2020  [facility] [address] [facility] [place] [address] [phone] (office) [phone] (fax)

## 2020-11-23 DIAGNOSIS — I509 Heart failure, unspecified: Secondary | ICD-10-CM | POA: Diagnosis not present

## 2020-11-26 ENCOUNTER — Other Ambulatory Visit: Payer: Self-pay

## 2020-11-26 ENCOUNTER — Ambulatory Visit: Payer: Medicare HMO

## 2020-11-26 DIAGNOSIS — I4891 Unspecified atrial fibrillation: Secondary | ICD-10-CM

## 2020-11-26 DIAGNOSIS — Z5181 Encounter for therapeutic drug level monitoring: Secondary | ICD-10-CM | POA: Diagnosis not present

## 2020-11-26 LAB — POCT INR: INR: 3.1 — AB (ref 2.0–3.0)

## 2020-11-26 NOTE — Patient Instructions (Signed)
-  take 1/2 tablet warfarin tonight, then - continue taking Warfarin 1 tablet every day.  - Recheck in 8 weeks.  Call with any medications changes [phone].

## 2020-12-11 ENCOUNTER — Other Ambulatory Visit: Payer: Self-pay | Admitting: *Deleted

## 2020-12-11 ENCOUNTER — Encounter (HOSPITAL_COMMUNITY): Payer: Medicare HMO | Attending: Cardiology

## 2020-12-11 ENCOUNTER — Encounter (HOSPITAL_COMMUNITY): Admission: RE | Admit: 2020-12-11 | Payer: Medicare HMO | Source: Ambulatory Visit

## 2020-12-11 MED ORDER — WARFARIN SODIUM 5 MG PO TABS: ORAL_TABLET | ORAL | 1 refills | Status: DC

## 2020-12-11 NOTE — Telephone Encounter (Signed)
[facility] called and stated pt needs a refill sent to [facility], sent refill as requested for 90 day supply.

## 2020-12-17 ENCOUNTER — Telehealth: Payer: Self-pay

## 2020-12-17 DIAGNOSIS — K649 Unspecified hemorrhoids: Secondary | ICD-10-CM | POA: Diagnosis not present

## 2020-12-17 DIAGNOSIS — K625 Hemorrhage of anus and rectum: Secondary | ICD-10-CM | POA: Diagnosis not present

## 2020-12-17 DIAGNOSIS — Z7901 Long term (current) use of anticoagulants: Secondary | ICD-10-CM | POA: Diagnosis not present

## 2020-12-17 NOTE — Telephone Encounter (Signed)
Pt called into clinic, stating she had 1 episode of blood in her stool after a bowel movement this am. Noticed blood on toliet paper as she was wiping herself.  Denies black tarry unusually foul smelling stool.  Pt states blood is reddish brown on toliet paper after wiping.  Offered pt appointment to have INR checked today here in our clinic.  Pt wants to know if she should go to ED.  Advised pt to contact PCP since she does not have a GI MD and report episode of blood in stool and see what their recommendations are, since the blood is not a gross amount and has only occurred x 1 episode and associated with a BM.  Advised pt to call back for appt if he recommends having INR checked first.  Also advised pt to go to ED if bleeding persists or increases in amount.  Pt verbalized understanding and appreciated the guidance.  Pt will call back if advised to check INR today prior to seeing PCP or going to ED.

## 2020-12-18 ENCOUNTER — Telehealth: Payer: Self-pay | Admitting: Pharmacist

## 2020-12-18 NOTE — Telephone Encounter (Signed)
Patient called.  Had bleeding with bowel movement yesterday and was directed to call PCP.  Was seen by PCP and diagnosed with hemorrhoids. PCP checked INR: 2.4.  Was referred to GI but no appt scheduled yet.  Patient held [place] last night.  Has had no instances of bleeding since.  Directed patient to restart warfarin at usual schedule and to call back if she begins bleeding again

## 2020-12-19 ENCOUNTER — Telehealth (HOSPITAL_COMMUNITY): Payer: Self-pay | Admitting: Cardiology

## 2020-12-19 NOTE — Telephone Encounter (Signed)
Pt seen by PCP [phone]) for rectal bleeding, pt will be referred to GI for further evaluation /work up however would like input regarding coumadin.  Spoke with Jinny Blossom in coumadin clinic. Reports most of the time in this case providers decide to hold coumadin for a day or two but will need order from provider for exact time frame.   Will forward for further recommendations

## 2020-12-19 NOTE — Telephone Encounter (Signed)
Depends on how severe her rectal bleeding is. If she is having active bleeding then can hold tonight's dose but need to see what her INR level is for further guidance. This was last checked 5/31. Also need CBC. See if she can get INR and CBC checked tomorrow.

## 2020-12-19 NOTE — Telephone Encounter (Signed)
Pt aware to hold one dose of coumadin 6/23 Pt reports she has a recheck appt with [facility] 6/24 @ 945am Order for labs given verbally brandy at [facility] @ [phone] and order faxed to [phone]

## 2020-12-20 DIAGNOSIS — Z7901 Long term (current) use of anticoagulants: Secondary | ICD-10-CM | POA: Diagnosis not present

## 2020-12-20 DIAGNOSIS — K625 Hemorrhage of anus and rectum: Secondary | ICD-10-CM | POA: Diagnosis not present

## 2020-12-23 ENCOUNTER — Other Ambulatory Visit: Payer: Self-pay

## 2020-12-23 ENCOUNTER — Ambulatory Visit: Payer: Medicare HMO | Admitting: *Deleted

## 2020-12-23 DIAGNOSIS — I4891 Unspecified atrial fibrillation: Secondary | ICD-10-CM

## 2020-12-23 DIAGNOSIS — Z5181 Encounter for therapeutic drug level monitoring: Secondary | ICD-10-CM | POA: Diagnosis not present

## 2020-12-23 LAB — POCT INR: INR: 2.1 (ref 2.0–3.0)

## 2020-12-23 NOTE — Patient Instructions (Addendum)
Description   Continue taking Warfarin 1 tablet every day. Recheck in 2 weeks (normally 8 weeks). Call with any medications changes [phone].

## 2020-12-24 DIAGNOSIS — I509 Heart failure, unspecified: Secondary | ICD-10-CM | POA: Diagnosis not present

## 2020-12-25 ENCOUNTER — Other Ambulatory Visit (HOSPITAL_COMMUNITY): Payer: Self-pay | Admitting: Cardiology

## 2020-12-25 DIAGNOSIS — I110 Hypertensive heart disease with heart failure: Secondary | ICD-10-CM | POA: Diagnosis not present

## 2020-12-25 DIAGNOSIS — J309 Allergic rhinitis, unspecified: Secondary | ICD-10-CM | POA: Diagnosis not present

## 2020-12-25 DIAGNOSIS — Z7901 Long term (current) use of anticoagulants: Secondary | ICD-10-CM | POA: Diagnosis not present

## 2020-12-25 DIAGNOSIS — I272 Pulmonary hypertension, unspecified: Secondary | ICD-10-CM | POA: Diagnosis not present

## 2020-12-25 DIAGNOSIS — E261 Secondary hyperaldosteronism: Secondary | ICD-10-CM | POA: Diagnosis not present

## 2020-12-25 DIAGNOSIS — D6869 Other thrombophilia: Secondary | ICD-10-CM | POA: Diagnosis not present

## 2020-12-25 DIAGNOSIS — I4891 Unspecified atrial fibrillation: Secondary | ICD-10-CM | POA: Diagnosis not present

## 2020-12-25 DIAGNOSIS — I509 Heart failure, unspecified: Secondary | ICD-10-CM | POA: Diagnosis not present

## 2020-12-25 DIAGNOSIS — E785 Hyperlipidemia, unspecified: Secondary | ICD-10-CM | POA: Diagnosis not present

## 2020-12-25 DIAGNOSIS — H5360 Unspecified night blindness: Secondary | ICD-10-CM | POA: Diagnosis not present

## 2020-12-26 DIAGNOSIS — I482 Chronic atrial fibrillation, unspecified: Secondary | ICD-10-CM | POA: Diagnosis not present

## 2020-12-26 DIAGNOSIS — I5032 Chronic diastolic (congestive) heart failure: Secondary | ICD-10-CM | POA: Diagnosis not present

## 2020-12-26 DIAGNOSIS — I130 Hypertensive heart and chronic kidney disease with heart failure and stage 1 through stage 4 chronic kidney disease, or unspecified chronic kidney disease: Secondary | ICD-10-CM | POA: Diagnosis not present

## 2020-12-26 DIAGNOSIS — N1831 Chronic kidney disease, stage 3a: Secondary | ICD-10-CM | POA: Diagnosis not present

## 2020-12-31 DIAGNOSIS — K5730 Diverticulosis of large intestine without perforation or abscess without bleeding: Secondary | ICD-10-CM | POA: Diagnosis not present

## 2020-12-31 DIAGNOSIS — K5900 Constipation, unspecified: Secondary | ICD-10-CM | POA: Diagnosis not present

## 2020-12-31 DIAGNOSIS — K600 Acute anal fissure: Secondary | ICD-10-CM | POA: Diagnosis not present

## 2020-12-31 DIAGNOSIS — K625 Hemorrhage of anus and rectum: Secondary | ICD-10-CM | POA: Diagnosis not present

## 2021-01-02 ENCOUNTER — Telehealth: Payer: Self-pay | Admitting: Pharmacist

## 2021-01-02 NOTE — Telephone Encounter (Signed)
Patient called to move her apt to 7/26. Advised we wanted to see her sooner since she missed several doses prior to her last apt. Advised she is at risk of clot or bleed if she moves the apt out. She states she has to understand the risk. Apt moved to 7/26

## 2021-01-07 ENCOUNTER — Other Ambulatory Visit (HOSPITAL_COMMUNITY): Payer: Self-pay | Admitting: Unknown Physician Specialty

## 2021-01-07 MED ORDER — TYVASO 0.6 MG/ML IN SOLN: 54.0000 ug | Freq: Four times a day (QID) | RESPIRATORY_TRACT | 5 refills | Status: DC

## 2021-01-11 ENCOUNTER — Other Ambulatory Visit (HOSPITAL_COMMUNITY): Payer: Self-pay | Admitting: Cardiology

## 2021-01-11 DIAGNOSIS — I272 Pulmonary hypertension, unspecified: Secondary | ICD-10-CM

## 2021-01-18 DIAGNOSIS — H5203 Hypermetropia, bilateral: Secondary | ICD-10-CM | POA: Diagnosis not present

## 2021-01-18 DIAGNOSIS — H524 Presbyopia: Secondary | ICD-10-CM | POA: Diagnosis not present

## 2021-01-18 DIAGNOSIS — H52209 Unspecified astigmatism, unspecified eye: Secondary | ICD-10-CM | POA: Diagnosis not present

## 2021-01-21 ENCOUNTER — Other Ambulatory Visit: Payer: Self-pay

## 2021-01-21 ENCOUNTER — Ambulatory Visit: Payer: Medicare HMO

## 2021-01-21 DIAGNOSIS — Z5181 Encounter for therapeutic drug level monitoring: Secondary | ICD-10-CM

## 2021-01-21 DIAGNOSIS — I4891 Unspecified atrial fibrillation: Secondary | ICD-10-CM

## 2021-01-21 LAB — POCT INR: INR: 2.7 (ref 2.0–3.0)

## 2021-01-21 NOTE — Patient Instructions (Signed)
Description   Continue taking Warfarin 1 tablet every day. Recheck in 6 weeks (normally 8 weeks). Call with any medications changes [phone].

## 2021-01-23 DIAGNOSIS — I509 Heart failure, unspecified: Secondary | ICD-10-CM | POA: Diagnosis not present

## 2021-01-26 DIAGNOSIS — I130 Hypertensive heart and chronic kidney disease with heart failure and stage 1 through stage 4 chronic kidney disease, or unspecified chronic kidney disease: Secondary | ICD-10-CM | POA: Diagnosis not present

## 2021-01-26 DIAGNOSIS — N1831 Chronic kidney disease, stage 3a: Secondary | ICD-10-CM | POA: Diagnosis not present

## 2021-01-26 DIAGNOSIS — I5032 Chronic diastolic (congestive) heart failure: Secondary | ICD-10-CM | POA: Diagnosis not present

## 2021-02-23 DIAGNOSIS — I509 Heart failure, unspecified: Secondary | ICD-10-CM | POA: Diagnosis not present

## 2021-02-26 ENCOUNTER — Other Ambulatory Visit: Payer: Self-pay

## 2021-02-26 ENCOUNTER — Ambulatory Visit (HOSPITAL_COMMUNITY)
Admission: RE | Admit: 2021-02-26 | Discharge: 2021-02-26 | Disposition: A | Discharge status: Home / Self Care | Payer: Medicare HMO | Source: Ambulatory Visit | Attending: Cardiology | Admitting: Cardiology

## 2021-02-26 VITALS — BP 110/70 | HR 61 | Wt 157.8 lb

## 2021-02-26 DIAGNOSIS — I482 Chronic atrial fibrillation, unspecified: Secondary | ICD-10-CM | POA: Insufficient documentation

## 2021-02-26 DIAGNOSIS — E785 Hyperlipidemia, unspecified: Secondary | ICD-10-CM | POA: Diagnosis not present

## 2021-02-26 DIAGNOSIS — Z7901 Long term (current) use of anticoagulants: Secondary | ICD-10-CM | POA: Diagnosis not present

## 2021-02-26 DIAGNOSIS — I313 Pericardial effusion (noninflammatory): Secondary | ICD-10-CM | POA: Insufficient documentation

## 2021-02-26 DIAGNOSIS — Z79899 Other long term (current) drug therapy: Secondary | ICD-10-CM | POA: Insufficient documentation

## 2021-02-26 DIAGNOSIS — I5032 Chronic diastolic (congestive) heart failure: Secondary | ICD-10-CM | POA: Insufficient documentation

## 2021-02-26 DIAGNOSIS — I2721 Secondary pulmonary arterial hypertension: Secondary | ICD-10-CM | POA: Insufficient documentation

## 2021-02-26 DIAGNOSIS — I110 Hypertensive heart disease with heart failure: Secondary | ICD-10-CM | POA: Diagnosis not present

## 2021-02-26 DIAGNOSIS — N1831 Chronic kidney disease, stage 3a: Secondary | ICD-10-CM | POA: Diagnosis not present

## 2021-02-26 DIAGNOSIS — Z8249 Family history of ischemic heart disease and other diseases of the circulatory system: Secondary | ICD-10-CM | POA: Diagnosis not present

## 2021-02-26 DIAGNOSIS — I130 Hypertensive heart and chronic kidney disease with heart failure and stage 1 through stage 4 chronic kidney disease, or unspecified chronic kidney disease: Secondary | ICD-10-CM | POA: Diagnosis not present

## 2021-02-26 LAB — BASIC METABOLIC PANEL
Anion gap: 5 (ref 5–15)
BUN: 12 mg/dL (ref 8–23)
CO2: 30 mmol/L (ref 22–32)
Calcium: 9.1 mg/dL (ref 8.9–10.3)
Chloride: 104 mmol/L (ref 98–111)
Creatinine, Ser: 1.05 mg/dL — ABNORMAL HIGH (ref 0.44–1.00)
GFR, Estimated: 54 mL/min — ABNORMAL LOW (ref >60)
Glucose, Bld: 86 mg/dL (ref 70–99)
Potassium: 3.6 mmol/L (ref 3.5–5.1)
Sodium: 139 mmol/L (ref 135–145)

## 2021-02-26 LAB — BRAIN NATRIURETIC PEPTIDE: B Natriuretic Peptide: 720.2 pg/mL — ABNORMAL HIGH (ref 0.0–100.0)

## 2021-02-26 NOTE — Patient Instructions (Signed)
Labs done today, your results will be available in MyChart, we will contact you for abnormal readings.  You have been ordered a PYP Scan.  This is done in the Radiology Department of [facility].  When you come for this test please plan to be there 2-3 hours.  Your physician recommends that you schedule a follow-up appointment in: 4 months  Your physician recommends that you schedule a follow-up appointment in: 4 months  If you have any questions or concerns before your next appointment please send [place] a message through [place] or call our office at [phone].    TO LEAVE A MESSAGE FOR THE NURSE SELECT OPTION 2, PLEASE LEAVE A MESSAGE INCLUDING: YOUR NAME DATE OF BIRTH CALL BACK NUMBER REASON FOR CALL**this is important as we prioritize the call backs  YOU WILL RECEIVE A CALL BACK THE SAME DAY AS LONG AS YOU CALL BEFORE 4:00 PM  At the [facility], you and your health needs are our priority. As part of our continuing mission to provide you with exceptional heart care, we have created designated Provider Care Teams. These Care Teams include your primary Cardiologist (physician) and Advanced Practice Providers (APPs- Physician Assistants and Nurse Practitioners) who all work together to provide you with the care you need, when you need it.   You may see any of the following providers on your designated Care Team at your next follow up: Dr Glori Bickers Dr [name] Dr Patrice Paradise, NP Lyda Jester, [place] Ginnie Smart [name], PharmD   Please be sure to bring in all your medications bottles to every appointment.

## 2021-02-27 NOTE — Progress Notes (Signed)
Date:  02/27/2021   ID:  Vicki [name], DOB Jun 27, 1941, MRN [id number]   Provider location: [place] [facility] Type of Visit: Established patient   PCP:  Deland Pretty, MD  Cardiologist:  Dr. Aundra Dubin   History of Present Illness: Vicki [name] is a 80 y.o. female who has a history of chronic diastolic CHF, pulmonary hypertension and chronic atrial fibrillation.  Patient was followed by Dr. Glade Lloyd in the past for chronic atrial fibrillation.  She has been on coumadin.  She reports progressive exertional dyspnea since 2011.  This gradually worsened and became quite significant over the last few months.  She used to have significant HTN, but more recently her BP has been on the lower side.  Echo was done in 2/14, showing severe concentric LVH with EF 55-60%, moderately dilated RV, moderate to severe TR, and PA systolic pressure 86 mmHg.  I did a right heart cath in 5/14.  This showed PA pressure 104/36 with PCWP 20, suggesting pulmonary arterial HTN well out of proportion to the mildly elevate wedge pressure.  She was already on amlodipine so I did not do vasodilator testing.  V/Q scan was done, showing no evidence for chronic PEs.  PFTs showed a restrictive defect. Cardiac MRI did not show definite evidence for amyloid.  I started her on macitentan 10 mg daily.  Initially, she felt better on macitentan.  However, she was admitted in 5/14 from her sleep study due to orthopnea and dyspnea.  She was diuresed for several days and diuretic was switched over to torsemide.  I next started her on tadalafil 20 mg daily and titrated up to 40 mg daily.  She thinks that this helped.  She saw pulmonary after chest CT (showed mosaic attenuation in lungs).  This was thought to be due to air-trapping rather than ILD.  She was started on Spiriva. She wears oxygen at home.    She had an echocardiogram in 2/15 that showed severe LVH, EF 75%, small pericardial effusion, mildly dilated RV with mildly  decreased systolic function, PA systolic pressure 84 mmHg.  She is not totally sure if she was taking macitentan and tadalafil at that time.  She has had a hard month.  She ran out of her macitentan and tadalafil and her insurance company refused to refill them.  After this, she took a decided turn for the worse.  [place] in the fall.  She became much more short of breath, just with walking around the house. She developed lightheaded spells, especially with micturation.  Given lightheadedness and presyncope, she was admitted last week.  She was restarted on her medications and she finally got back on her meds at home.  In the hospital, she was noted to be bradycardic with HR to 30s so metoprolol was stopped.  Of note, her abdominal fat pad biopsy did not suggest amyloidosis. Repeat RHC in 3/15 still with moderate to severe PAH and low CI.  Holter off beta blocker in 3/15 showed average HR 73.    Echo in 9/17 showed EF 65-70% with moderate to severe LVH, moderate MR, normal RV size with mildly decreased systolic function, PASP 56 mmHg, small pericardial effusion.    Echo in 10/18 showed EF 60-65% with moderate LVH, moderate MR, severe biatrial enlargement, PASP 62 mmHg, RV normal.  PYP scan in 10/18 was not suggestive of TTR amyloidosis.  Echo in 10/19 showed EF 65-70%, moderate MR, normal RV size and systolic function, PASP 43 mmHg.   Echo in 12/20 showed EF 65-70%, moderate LVH, severe biatrial enlargement, mild-moderate MR, small pericardial effusion, unable to estimate PA systolic pressure.   PYP scan in 12/20 was visually grade 2 with H/CL 1.96 but activity appeared localized to the atria. Invitae gene testing for TTR amyloidosis in 3/21 was negative.   Echo in 2/22 showed EF 60-65%, moderate LVH, normal RV, PASP 37 mmHg, moderate central MR.    She returns for followup of CHF and pulmonary hypertension. On Tyvaso,  tadalafil, and macitentan for pulmonary hypertension, able to get all meds without problems.  Weight down 4 lbs.  No dyspnea walking on flat ground.  Mild dyspnea walking up stairs.  No orthopnea/PND. No lightheadedness.  Occasional atypical chest pain, not exertional.       6 minute walk (5/14): 122 m.   6 minute walk (7/14): 152 m 6 minute walk (10/14): 183 m 6 minute walk (4/15): 317 m 6 minute walk (12/15): 259 m 6 minute walk (4/16): 293 m 6 minute walk (8/16): 341 m 6 minute walk (12/16): 274 m 6 minute walk (6/17): 314 m 6 minute walk (9/17): 307 m 6 minute walk (4/18): 366 m 6 minute walk (10/18): 320 m 6 minute walk (3/19): 381 m 6 minute walk (9/19): 366 m 6 minute walk (9/20): 305 m 6 minute walk (12/20): 243 m 6 minute walk (6/21): 304 m 6 minute walk (11/21): 323 m 6 minute walk (2/22): 305 m 6 minute walk (5/22): 366 m   Labs (12/13): HCT 45.1, plts 153, K 3.5, creatinine 1.0 Labs (1/14): BNP 849 Labs (4/14): K 3.1, creatinine 1.0, ANA and anti-SCL-70 antibody negative.  Rheumatoid factor negative.  Serum immunofixation did not show monoclonal light chains.  Labs (5/14): K 3.3, creatinine 0.79, proBNP 5147 Labs (6/14): K 4, creatinine 0.9, BNP 1001 Labs (10/14): LDL 53, LDL-P 975, K 3.7, creatinine 1.2 Labs (11/14): K 3.7, creatinine 0.9, BNP 832 Labs (2/15): K 3.5, creatinine 1.10, HCT 42.6, UPEP negative, SPEP negative.  Labs (3/15): K 3.8, creatinine 0.88 Labs (4/15): K 3.7, creatinine 0.99, proBNP 1653 Labs (7/15): K 4, creatinine 0.93 Labs (12/15): LDL 77, HDL 58, K 3.9, creatinine 1.03, LFTs normal Labs (4/16): K 3.8, creatinine 0.93, BNP 475, plts 105, HCT 42.3 Labs (11/16): K 3.6, creatinine 1.1, HCT 42.2 Labs (12/16): LDL 54, HDL 50, BNP 628 Labs (6/17): K 3.7, creatinine 0.97, HCT 41.3, BNP 489 Labs (9/17): K 3.8, creatinine 0.97, LDL 65, HDL 54 Labs (9/18): K 4.1, creatinine 1.05, hgb 14.6, BNP 646 Labs (10/18): BNP 588, TSH normal, LDL 73, HDL 48, K  3.5, creatinine 0.97, hgb 12.5, plts 91, LFTs normal Labs (3/19): K 4.1, creatinine 1.07, hgb 13.2 Labs (9/19): K 3.8, creatinine 1.1 Labs (7/20): K 3.9, creatinine 1.3, LDL 63 Labs (9/20): K 4.2, creatinine 1.05, hgb 12.6, BNP 632  Labs (12/20): Myeloma panel negative, urine immunofixation negative.  Labs (1/21): K 4.3, creatinine 1.12, LDL 63 Labs (3/21): K 3.8, creatinine 1.13 Labs (6/21): K 3.9, creatinine 1.16 Labs (11/21): LDL 63, K 4.1, creatinine 1.19 Labs (2/22): K 4.3, creatinine 1.14, hgb 11.5, BNP 915 Labs (5/22): LDL 65 Labs (6/22): K 4, creatinine 1.16   PMH: 1. Chronic diastolic CHF: Echo (4/09) with EF 55-60%, severe LVH (no SAM, no asymmetric hypertrophy, no LVOT gradient), moderate-severe LAE, moderately dilated RV with mildly decreased systolic function, moderate to severe RAE,  PA systolic pressure 86 mmHg, moderate-severe TR, moderate MR, trivial pericardial effusion.  Cardiac MRI (5/14): EF 65%, severe LVH, no definite evidence for amyloidosis (no delayed enhancement, myocardium not difficult to null).  Echo (2/15) with EF 75%, severe LVH, grade II diastolic dysfunction, moderate MR, RV mildly dilated with mildly decreased systolic function, moderate TR, PA systolic pressure 84 mmHg, small pericardial effusion. Abdominal fat pad biopsy (2/15) showed no evidence for amyloidosis.  SPEP/UPEP negative 2/15.  - Echo (4/16) with EF 60-65%, severe LVH, RV mildly dilated with mildly decreased systolic function, PA systolic pressure 40 mmHg. - Echo (9/17) with EF 65-70%, severe LVH, moderate MR, normal RV size with mildly decreased systolic function, PASP 56 mmHg, small pericardial effusion.  - Echo (10/18): EF 60-65% with moderate LVH, moderate MR, severe biatrial enlargement, PASP 62 mmHg, RV normal. - PYP scan (10/18) was not suggestive of TTR amyloidosis.  - Echo (10/19): EF 65-70%, moderate MR, normal RV size and systolic function, PASP 43 mmHg. - Echo (12/20): EF 65-70%,  moderate LVH, severe biatrial enlargement, mild-moderate MR, small pericardial effusion, unable to estimate PA systolic pressure.  - PYP scan (12/20): grade 2 with H/CL 1.96 but activity localized to the atria.  - Invitae gene testing for TTR amyloidosis in 3/21 was negative.  - Echo (2/22): EF 60-65%, moderate LVH, normal RV, PASP 37 mmHg, moderate central MR. 2. Chronic atrial fibrillation since around 2004.  Developed bradycardia and metoprolol stopped 2/15. Holter (3/15) with average HR 73, atrial fibrillation, 3.8 sec pause x 1 while asleep, PVCs.  3. HTN: For decades.  4. LHC (2/08) with no significant disease.  5. Chronic thrombocytopenia: ITP 6. Pulmonary arterial HTN: RHC (5/14) with mean RA 13, PA 104/36 (mean 63), mean PCWP 20 on right and 23 on left, CI 2.3 (Fick) and 1.6 (thermo), PVR 10.4 WU (Fick) and 15 WU (thermo).  Vasodilator testing not done as patient was already on amlodipine.  V/Q scan (5/14) with no evidence for chronic PE.  ANA, RF, and anti-SCL70 antibody negative.  PFTs (5/14) with FEV1 60%, FVC 54%, ratio 112%, TLC 61%, DLCO 43% => restrictive defect.  Sleep study (7/14) with no OSA.  CT chest with areas of mosaic attenuation in lungs (saw pulmonary, thought air trapping and not ILD). RHC (3/15) with RA mean 5, PA 66/31 mean 43, PCWP mean 11, Cardiac Index (Fick) 1.97, PVR 9.2 WU.  Patient was started on Tyvaso in 3/15.  Echo (4/16) with mildly dilated and mildly dysfunctional RV, PA systolic pressure 40 mmHg.  7. Chest pain: Cardiolite 11/21 with no ischemia.   Current Outpatient Medications  Medication Sig Dispense Refill   acetaminophen (TYLENOL) 500 MG tablet Take 500 mg by mouth every 6 (six) hours as needed for mild pain or headache.      amLODipine (NORVASC) 2.5 MG tablet TAKE ONE TABLET BY MOUTH ONCE DAILY 30 tablet 0   Cholecalciferol (VITAMIN D3) 2000 units TABS Take 1 tablet by mouth daily.      KLOR-CON M20 20 MEQ tablet TAKE TWO TABLETS BY MOUTH IN THE MORNING  AND ONE IN THE EVENING 90 tablet 0   levocetirizine (XYZAL) 5 MG tablet Take 2.5 mg by mouth daily in the afternoon.     macitentan (OPSUMIT) 10 MG tablet TAKE 1 TABLET (10 MG TOTAL) BY MOUTH DAILY WITH BREAKFAST. 90 tablet 3   rosuvastatin (CRESTOR) 20 MG tablet Take 20 mg by mouth at bedtime.      tadalafil, PAH, (ADCIRCA) 20  MG tablet TAKE 2 TABLETS DAILY GENERIC FOR ADCIRCA 60 tablet 11   torsemide (DEMADEX) 20 MG tablet Take 1.5 tablets (30 mg total) by mouth 2 (two) times daily. 90 tablet 6   Treprostinil (TYVASO) 0.6 MG/ML SOLN Inhale 54 mcg into the lungs 4 (four) times daily. 30 mL 5   warfarin (COUMADIN) 5 MG tablet TAKE 1 TABLET BY MOUTH AS DIRECTED BY  COUMADIN  CLINIC 95 tablet 1   No current facility-administered medications for this encounter.    Allergies:   Patient has no known allergies.   Social History:  The patient  reports that she has never smoked. She has never used smokeless tobacco. She reports current alcohol use of about 3.0 standard drinks per week. She reports that she does not use drugs.   Family History:  The patient's family history includes Alzheimer's disease in her mother; Heart Problems in her brother and maternal aunt; Heart attack in her father; Hypertension in her brother; Pulmonary Hypertension in her cousin; Thyroid disease in her daughter, daughter, and maternal aunt.   ROS:  Please see the history of present illness.   All other systems are personally reviewed and negative.   Exam:   BP 110/70   Pulse 61   Wt 71.6 kg (157 lb 12.8 oz)   SpO2 95%   BMI 28.86 kg/m   General: NAD Neck: No JVD, no thyromegaly or thyroid nodule.  Lungs: Clear to auscultation bilaterally with normal respiratory effort. CV: Nondisplaced PMI.  Heart regular S1/S2, no S3/S4, 2/6 SEM RUSB.  No peripheral edema.  No carotid bruit.  Normal pedal pulses.  Abdomen: Soft, nontender, no hepatosplenomegaly, no distention.  Skin: Intact without lesions or rashes.  Neurologic:  Alert and oriented x 3.  Psych: Normal affect. Extremities: No clubbing or cyanosis.  HEENT: Normal.    Recent Labs: 08/09/2020: Hemoglobin 12.5; Platelets 72 02/26/2021: B Natriuretic Peptide 720.2; BUN 12; Creatinine, Ser 1.05; Potassium 3.6; Sodium 139  Personally reviewed   Wt Readings from Last 3 Encounters:  02/26/21 71.6 kg (157 lb 12.8 oz)  11/15/20 73.4 kg (161 lb 12.8 oz)  08/09/20 73.8 kg (162 lb 12.8 oz)    ASSESSMENT AND PLAN:  1. Pulmonary HTN: Patient has severe pulmonary arterial HTN.  RHC in 3/15 showed CI 1.97, PVR 9. 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 Collagen vascular disease workup was negative (negative RF, ANA and negative anti-SCL-70).  V/Q scan was not suggestive of chronic PEs.  PFTs were suggestive of restrictive lung disease but CT chest and evaluation by pulmonary did not suggest interstitial lung disease.  Sleep study did not show OSA.  Echo in 3/22 showed normal RV size and systolic function, PASP estimation 37 mmHg.  She is stable symptomatically.  - Continue oxygen at night and with exertion as needed (has not needed during the day).  - Continue Tyvaso, macitentan, Adcirca.  She is doing well on this regimen and is no longer having trouble getting her meds. - BNP today.  2. Chronic diastolic CHF: EF preserved on echo with moderate concentric LVH and a small pericardial effusion.  It is possible that the LVH is due to years of HTN.  The cardiac MRI was not definitively suggestive of cardiac amyloidosis.  I was still concerned for amyloidosis given the appearance of the LV myocardium on echoes.  Abdominal fat pad biopsy in 2015 showed no evidence for amyloidosis. She has some symptoms suggestive of mild peripheral neuropathy.  PYP scan in 10/18  was not suggestive of TTR amyloidosis. I assessed her again for cardiac amyloidosis in 2020: myeloma panel and urine immunofixation negative, PYP scan in 12/20 was grade 2 with H/CL 1.97 but activity was  localized to the atria.  Invitae gene testing for TTR amyloidosis was negative.  NYHA class II symptoms.  She is not volume overloaded.  - I have reviewed her PYP scan with colleagues, unusual pattern with uptake localized primarily to the atria and not the ventricles.  She does not think that she could do an MRI again (very claustrophobic the first time).  I referred her to the amyloidosis clinic at [facility] for another opinion on treatment but they have never got her in.  I will repeat PYP scan (has been > 1 year) to see if uptake is progressive in the LV.  If so, she will need tafamidis.  Hopefully, her insurance will cover this repeat study > 1 year later for an equivocal prior study.  - Continue torsemide 30 mg bid. BMET today.   3. HTN: BP is controlled.  4. Chronic atrial fibrillation: Continue coumadin.  She is off metoprolol due to bradycardia. Holter off metoprolol did not show any high rate episodes.  5. Hyperlipidemia: On Crestor, good lipids in 5/22.   Followup in 3 months.   Signed, [name], MD  02/27/2021  [place] [address] [facility] [place] [address] [phone] (office) [phone] (fax)

## 2021-03-04 ENCOUNTER — Other Ambulatory Visit: Payer: Self-pay

## 2021-03-04 ENCOUNTER — Ambulatory Visit: Payer: Medicare HMO | Admitting: *Deleted

## 2021-03-04 DIAGNOSIS — Z5181 Encounter for therapeutic drug level monitoring: Secondary | ICD-10-CM | POA: Diagnosis not present

## 2021-03-04 DIAGNOSIS — I4891 Unspecified atrial fibrillation: Secondary | ICD-10-CM

## 2021-03-04 LAB — POCT INR: INR: 2.6 (ref 2.0–3.0)

## 2021-03-04 NOTE — Patient Instructions (Signed)
Description   ?Continue taking Warfarin 1 tablet everyday. Recheck in 6 weeks. Call with any medications changes [phone]. ?  ?  ?

## 2021-03-14 ENCOUNTER — Other Ambulatory Visit (HOSPITAL_COMMUNITY): Payer: Self-pay

## 2021-03-17 ENCOUNTER — Encounter (HOSPITAL_COMMUNITY)
Admission: RE | Admit: 2021-03-17 | Discharge: 2021-03-17 | Disposition: A | Discharge status: Home / Self Care | Payer: Medicare HMO | Source: Ambulatory Visit | Attending: Cardiology | Admitting: Cardiology

## 2021-03-17 ENCOUNTER — Other Ambulatory Visit: Payer: Self-pay

## 2021-03-17 DIAGNOSIS — I43 Cardiomyopathy in diseases classified elsewhere: Secondary | ICD-10-CM | POA: Insufficient documentation

## 2021-03-17 DIAGNOSIS — I5032 Chronic diastolic (congestive) heart failure: Secondary | ICD-10-CM | POA: Diagnosis not present

## 2021-03-17 DIAGNOSIS — E854 Organ-limited amyloidosis: Secondary | ICD-10-CM | POA: Insufficient documentation

## 2021-03-17 MED ORDER — TECHNETIUM TC 99M PYROPHOSPHATE
21.4000 | Freq: Once | INTRAVENOUS | Status: AC | PRN
  Administered 2021-03-17: 21.4 via INTRAVENOUS
  Filled 2021-03-17: qty 22

## 2021-03-19 DIAGNOSIS — H3561 Retinal hemorrhage, right eye: Secondary | ICD-10-CM | POA: Diagnosis not present

## 2021-03-19 DIAGNOSIS — Z961 Presence of intraocular lens: Secondary | ICD-10-CM | POA: Diagnosis not present

## 2021-03-19 DIAGNOSIS — H35371 Puckering of macula, right eye: Secondary | ICD-10-CM | POA: Diagnosis not present

## 2021-03-19 DIAGNOSIS — H35341 Macular cyst, hole, or pseudohole, right eye: Secondary | ICD-10-CM | POA: Diagnosis not present

## 2021-03-19 DIAGNOSIS — H35351 Cystoid macular degeneration, right eye: Secondary | ICD-10-CM | POA: Diagnosis not present

## 2021-03-26 DIAGNOSIS — I509 Heart failure, unspecified: Secondary | ICD-10-CM | POA: Diagnosis not present

## 2021-03-28 DIAGNOSIS — I130 Hypertensive heart and chronic kidney disease with heart failure and stage 1 through stage 4 chronic kidney disease, or unspecified chronic kidney disease: Secondary | ICD-10-CM | POA: Diagnosis not present

## 2021-03-28 DIAGNOSIS — I5032 Chronic diastolic (congestive) heart failure: Secondary | ICD-10-CM | POA: Diagnosis not present

## 2021-03-28 DIAGNOSIS — I482 Chronic atrial fibrillation, unspecified: Secondary | ICD-10-CM | POA: Diagnosis not present

## 2021-03-28 DIAGNOSIS — N1831 Chronic kidney disease, stage 3a: Secondary | ICD-10-CM | POA: Diagnosis not present

## 2021-03-31 ENCOUNTER — Other Ambulatory Visit: Payer: Self-pay | Admitting: Cardiology

## 2021-04-04 ENCOUNTER — Telehealth (HOSPITAL_COMMUNITY): Payer: Self-pay | Admitting: Pharmacy Technician

## 2021-04-04 ENCOUNTER — Other Ambulatory Visit (HOSPITAL_COMMUNITY): Payer: Self-pay

## 2021-04-04 NOTE — Telephone Encounter (Signed)
Advanced Heart Failure Patient Advocate Encounter  Patient called in requesting help with Tadalafil and Opsumit. Her current [place] does not have enough to cover the Opsumit co-pay. The Tadalafil is $10.87, she has $91 left on [employer] and will continue to use that when she fills with [facility].  Opsumit patient assistance application with [employer] is being mailed.   Will fax in once signatures are received.

## 2021-04-15 ENCOUNTER — Ambulatory Visit: Payer: Medicare HMO | Admitting: *Deleted

## 2021-04-15 ENCOUNTER — Other Ambulatory Visit: Payer: Self-pay

## 2021-04-15 DIAGNOSIS — I4891 Unspecified atrial fibrillation: Secondary | ICD-10-CM | POA: Diagnosis not present

## 2021-04-15 DIAGNOSIS — Z5181 Encounter for therapeutic drug level monitoring: Secondary | ICD-10-CM | POA: Diagnosis not present

## 2021-04-15 LAB — POCT INR: INR: 3.2 — AB (ref 2.0–3.0)

## 2021-04-15 NOTE — Patient Instructions (Signed)
Description   Today take 1/2 tablet then continue taking Warfarin 1 tablet every day. Recheck in 4 weeks. Call with any medications changes [phone].

## 2021-04-21 DIAGNOSIS — Z23 Encounter for immunization: Secondary | ICD-10-CM | POA: Diagnosis not present

## 2021-04-21 DIAGNOSIS — L259 Unspecified contact dermatitis, unspecified cause: Secondary | ICD-10-CM | POA: Diagnosis not present

## 2021-04-21 DIAGNOSIS — L299 Pruritus, unspecified: Secondary | ICD-10-CM | POA: Diagnosis not present

## 2021-04-23 NOTE — Telephone Encounter (Signed)
Advanced Heart Failure Patient Advocate Encounter  Patient called in stating that she lost the Opsumit assistance paperwork. Will mail again.

## 2021-04-25 DIAGNOSIS — I509 Heart failure, unspecified: Secondary | ICD-10-CM | POA: Diagnosis not present

## 2021-04-25 NOTE — Telephone Encounter (Signed)
The patient has been approved for a PAN [place] grant that will help cover the cost of Opsumit.  BIN [id number] PCN PANF ID [id number] Group [id number] Amount $5,500.00  Will confirm with patient that the grant information should go to [name]. We can then stop Opsumit assistance efforts through [employer].

## 2021-04-28 DIAGNOSIS — I130 Hypertensive heart and chronic kidney disease with heart failure and stage 1 through stage 4 chronic kidney disease, or unspecified chronic kidney disease: Secondary | ICD-10-CM | POA: Diagnosis not present

## 2021-04-28 DIAGNOSIS — I482 Chronic atrial fibrillation, unspecified: Secondary | ICD-10-CM | POA: Diagnosis not present

## 2021-04-28 DIAGNOSIS — I5032 Chronic diastolic (congestive) heart failure: Secondary | ICD-10-CM | POA: Diagnosis not present

## 2021-04-28 DIAGNOSIS — N1831 Chronic kidney disease, stage 3a: Secondary | ICD-10-CM | POA: Diagnosis not present

## 2021-05-01 ENCOUNTER — Other Ambulatory Visit (HOSPITAL_COMMUNITY): Payer: Self-pay | Admitting: *Deleted

## 2021-05-01 DIAGNOSIS — I272 Pulmonary hypertension, unspecified: Secondary | ICD-10-CM

## 2021-05-01 MED ORDER — TADALAFIL (PAH) 20 MG PO TABS: ORAL_TABLET | ORAL | 11 refills | Status: DC

## 2021-05-01 NOTE — Telephone Encounter (Signed)
Advanced Heart Failure Patient Advocate Encounter  Patient will receive both Opsumit ($590.58) and Tadalafil ($85.13) from [facility]) specialty pharmacy. Both medications are covered by the PAN grant. Provided verbal for Tadalafil PAH to pharmacy. They are going to reach out to the patient to set up shipment.  We will not seek assistance at this time. Advised the patient to call back with issues.   Charlann Boxer, CPhT

## 2021-05-13 ENCOUNTER — Telehealth (HOSPITAL_COMMUNITY): Payer: Self-pay | Admitting: Pharmacy Technician

## 2021-05-13 NOTE — Telephone Encounter (Signed)
Advanced Heart Failure Patient Advocate Encounter  Sent in [employer]ciber's portion of Assist application for Tyvaso assistance.  Charlann Boxer, CPhT

## 2021-05-20 ENCOUNTER — Other Ambulatory Visit: Payer: Self-pay

## 2021-05-20 ENCOUNTER — Ambulatory Visit: Payer: Medicare HMO

## 2021-05-20 DIAGNOSIS — Z5181 Encounter for therapeutic drug level monitoring: Secondary | ICD-10-CM | POA: Diagnosis not present

## 2021-05-20 DIAGNOSIS — I4891 Unspecified atrial fibrillation: Secondary | ICD-10-CM | POA: Diagnosis not present

## 2021-05-20 LAB — POCT INR: INR: 3.1 — AB (ref 2.0–3.0)

## 2021-05-20 NOTE — Patient Instructions (Signed)
Description   Take 1/2 tablet today, then resume same dosage of Warfarin 1 tablet every day. Recheck in 4 weeks. Call with any medications changes [phone].

## 2021-05-26 DIAGNOSIS — I509 Heart failure, unspecified: Secondary | ICD-10-CM | POA: Diagnosis not present

## 2021-05-28 DIAGNOSIS — I130 Hypertensive heart and chronic kidney disease with heart failure and stage 1 through stage 4 chronic kidney disease, or unspecified chronic kidney disease: Secondary | ICD-10-CM | POA: Diagnosis not present

## 2021-05-28 DIAGNOSIS — I5032 Chronic diastolic (congestive) heart failure: Secondary | ICD-10-CM | POA: Diagnosis not present

## 2021-05-28 DIAGNOSIS — N1831 Chronic kidney disease, stage 3a: Secondary | ICD-10-CM | POA: Diagnosis not present

## 2021-05-28 DIAGNOSIS — I482 Chronic atrial fibrillation, unspecified: Secondary | ICD-10-CM | POA: Diagnosis not present

## 2021-06-17 ENCOUNTER — Other Ambulatory Visit: Payer: Self-pay

## 2021-06-17 ENCOUNTER — Ambulatory Visit: Payer: Medicare HMO | Admitting: *Deleted

## 2021-06-17 DIAGNOSIS — Z5181 Encounter for therapeutic drug level monitoring: Secondary | ICD-10-CM | POA: Diagnosis not present

## 2021-06-17 DIAGNOSIS — I4891 Unspecified atrial fibrillation: Secondary | ICD-10-CM

## 2021-06-17 LAB — POCT INR: INR: 3.2 — AB (ref 2.0–3.0)

## 2021-06-17 NOTE — Patient Instructions (Signed)
Description   Take 1/2 tablet today, then start taking Warfarin 1 tablet everyday except 1/2 tablet on Tuesdays. Recheck in 4 weeks. Call with any medications changes [phone].

## 2021-06-24 ENCOUNTER — Encounter (HOSPITAL_COMMUNITY): Payer: Medicare HMO | Admitting: Cardiology

## 2021-06-25 DIAGNOSIS — I509 Heart failure, unspecified: Secondary | ICD-10-CM | POA: Diagnosis not present

## 2021-06-27 DIAGNOSIS — I482 Chronic atrial fibrillation, unspecified: Secondary | ICD-10-CM | POA: Diagnosis not present

## 2021-06-27 DIAGNOSIS — I5032 Chronic diastolic (congestive) heart failure: Secondary | ICD-10-CM | POA: Diagnosis not present

## 2021-06-27 DIAGNOSIS — I130 Hypertensive heart and chronic kidney disease with heart failure and stage 1 through stage 4 chronic kidney disease, or unspecified chronic kidney disease: Secondary | ICD-10-CM | POA: Diagnosis not present

## 2021-06-27 DIAGNOSIS — N1831 Chronic kidney disease, stage 3a: Secondary | ICD-10-CM | POA: Diagnosis not present

## 2021-07-15 ENCOUNTER — Other Ambulatory Visit: Payer: Self-pay

## 2021-07-15 ENCOUNTER — Ambulatory Visit: Payer: Medicare HMO | Admitting: *Deleted

## 2021-07-15 DIAGNOSIS — Z5181 Encounter for therapeutic drug level monitoring: Secondary | ICD-10-CM | POA: Diagnosis not present

## 2021-07-15 DIAGNOSIS — I4891 Unspecified atrial fibrillation: Secondary | ICD-10-CM

## 2021-07-15 LAB — POCT INR: INR: 2.4 (ref 2.0–3.0)

## 2021-07-15 NOTE — Patient Instructions (Addendum)
Description   Continue taking the dose you have been taken, which is, Warfarin 1 tablet everyday. Recheck in 3 weeks with Dr. Aundra Dubin Appt. Call with any medications changes [phone].

## 2021-07-25 DIAGNOSIS — I272 Pulmonary hypertension, unspecified: Secondary | ICD-10-CM | POA: Diagnosis not present

## 2021-07-25 DIAGNOSIS — I4891 Unspecified atrial fibrillation: Secondary | ICD-10-CM | POA: Diagnosis not present

## 2021-07-25 DIAGNOSIS — E785 Hyperlipidemia, unspecified: Secondary | ICD-10-CM | POA: Diagnosis not present

## 2021-07-25 DIAGNOSIS — I509 Heart failure, unspecified: Secondary | ICD-10-CM | POA: Diagnosis not present

## 2021-07-25 DIAGNOSIS — Z008 Encounter for other general examination: Secondary | ICD-10-CM | POA: Diagnosis not present

## 2021-07-25 DIAGNOSIS — Z6830 Body mass index (BMI) 30.0-30.9, adult: Secondary | ICD-10-CM | POA: Diagnosis not present

## 2021-07-25 DIAGNOSIS — K219 Gastro-esophageal reflux disease without esophagitis: Secondary | ICD-10-CM | POA: Diagnosis not present

## 2021-07-25 DIAGNOSIS — D696 Thrombocytopenia, unspecified: Secondary | ICD-10-CM | POA: Diagnosis not present

## 2021-07-25 DIAGNOSIS — J309 Allergic rhinitis, unspecified: Secondary | ICD-10-CM | POA: Diagnosis not present

## 2021-07-25 DIAGNOSIS — E669 Obesity, unspecified: Secondary | ICD-10-CM | POA: Diagnosis not present

## 2021-07-25 DIAGNOSIS — D6869 Other thrombophilia: Secondary | ICD-10-CM | POA: Diagnosis not present

## 2021-07-25 DIAGNOSIS — D6859 Other primary thrombophilia: Secondary | ICD-10-CM | POA: Diagnosis not present

## 2021-07-25 DIAGNOSIS — I110 Hypertensive heart disease with heart failure: Secondary | ICD-10-CM | POA: Diagnosis not present

## 2021-07-26 DIAGNOSIS — I509 Heart failure, unspecified: Secondary | ICD-10-CM | POA: Diagnosis not present

## 2021-07-29 DIAGNOSIS — I5032 Chronic diastolic (congestive) heart failure: Secondary | ICD-10-CM | POA: Diagnosis not present

## 2021-07-29 DIAGNOSIS — N1831 Chronic kidney disease, stage 3a: Secondary | ICD-10-CM | POA: Diagnosis not present

## 2021-07-29 DIAGNOSIS — I482 Chronic atrial fibrillation, unspecified: Secondary | ICD-10-CM | POA: Diagnosis not present

## 2021-07-29 DIAGNOSIS — I130 Hypertensive heart and chronic kidney disease with heart failure and stage 1 through stage 4 chronic kidney disease, or unspecified chronic kidney disease: Secondary | ICD-10-CM | POA: Diagnosis not present

## 2021-08-05 ENCOUNTER — Ambulatory Visit: Payer: Medicare HMO | Admitting: *Deleted

## 2021-08-05 ENCOUNTER — Encounter (HOSPITAL_COMMUNITY): Payer: Self-pay | Admitting: Cardiology

## 2021-08-05 ENCOUNTER — Other Ambulatory Visit: Payer: Self-pay

## 2021-08-05 ENCOUNTER — Ambulatory Visit (HOSPITAL_COMMUNITY)
Admission: RE | Admit: 2021-08-05 | Discharge: 2021-08-05 | Disposition: A | Discharge status: Home / Self Care | Payer: Medicare HMO | Source: Ambulatory Visit | Attending: Cardiology | Admitting: Cardiology

## 2021-08-05 VITALS — BP 144/80 | HR 58 | Wt 154.0 lb

## 2021-08-05 DIAGNOSIS — I482 Chronic atrial fibrillation, unspecified: Secondary | ICD-10-CM | POA: Diagnosis not present

## 2021-08-05 DIAGNOSIS — I272 Pulmonary hypertension, unspecified: Secondary | ICD-10-CM | POA: Insufficient documentation

## 2021-08-05 DIAGNOSIS — I5032 Chronic diastolic (congestive) heart failure: Secondary | ICD-10-CM | POA: Insufficient documentation

## 2021-08-05 DIAGNOSIS — E854 Organ-limited amyloidosis: Secondary | ICD-10-CM | POA: Diagnosis not present

## 2021-08-05 DIAGNOSIS — Z79899 Other long term (current) drug therapy: Secondary | ICD-10-CM | POA: Insufficient documentation

## 2021-08-05 DIAGNOSIS — Z7901 Long term (current) use of anticoagulants: Secondary | ICD-10-CM | POA: Diagnosis not present

## 2021-08-05 DIAGNOSIS — E785 Hyperlipidemia, unspecified: Secondary | ICD-10-CM | POA: Insufficient documentation

## 2021-08-05 DIAGNOSIS — Z5181 Encounter for therapeutic drug level monitoring: Secondary | ICD-10-CM | POA: Diagnosis not present

## 2021-08-05 DIAGNOSIS — I110 Hypertensive heart disease with heart failure: Secondary | ICD-10-CM | POA: Insufficient documentation

## 2021-08-05 DIAGNOSIS — I4891 Unspecified atrial fibrillation: Secondary | ICD-10-CM

## 2021-08-05 DIAGNOSIS — I43 Cardiomyopathy in diseases classified elsewhere: Secondary | ICD-10-CM | POA: Diagnosis not present

## 2021-08-05 LAB — BASIC METABOLIC PANEL
Anion gap: 8 (ref 5–15)
BUN: 16 mg/dL (ref 8–23)
CO2: 29 mmol/L (ref 22–32)
Calcium: 9.3 mg/dL (ref 8.9–10.3)
Chloride: 105 mmol/L (ref 98–111)
Creatinine, Ser: 1.26 mg/dL — ABNORMAL HIGH (ref 0.44–1.00)
GFR, Estimated: 43 mL/min — ABNORMAL LOW (ref >60)
Glucose, Bld: 91 mg/dL (ref 70–99)
Potassium: 3.8 mmol/L (ref 3.5–5.1)
Sodium: 142 mmol/L (ref 135–145)

## 2021-08-05 LAB — LIPID PANEL
Cholesterol: 139 mg/dL (ref 0–200)
HDL: 67 mg/dL (ref >40)
LDL Cholesterol: 62 mg/dL (ref 0–99)
Total CHOL/HDL Ratio: 2.1 RATIO
Triglycerides: 51 mg/dL (ref <150)
VLDL: 10 mg/dL (ref 0–40)

## 2021-08-05 LAB — BRAIN NATRIURETIC PEPTIDE: B Natriuretic Peptide: 622.6 pg/mL — ABNORMAL HIGH (ref 0.0–100.0)

## 2021-08-05 LAB — POCT INR: INR: 2.6 (ref 2.0–3.0)

## 2021-08-05 NOTE — Patient Instructions (Signed)
Description   ?Continue taking Warfarin 1 tablet everyday. Recheck in 4 weeks. Call with any medications changes [phone]. ?  ?  ?

## 2021-08-05 NOTE — Progress Notes (Signed)
6 Min Walk Test Completed  Pt ambulated 365.7 meters O2 Sat ranged 92%-97% on room air HR ranged 70-104

## 2021-08-05 NOTE — Patient Instructions (Signed)
Medication Changes:  New medication to come once approved is Tafamidis 61m daily  Lab Work:  Labs done today, your results will be available in MyChart, we will contact you for abnormal readings.   Testing/Procedures:  Your physician has requested that you have an echocardiogram. Echocardiography is a painless test that uses sound waves to create images of your heart. It provides your doctor with information about the size and shape of your heart and how well your hearts chambers and valves are working. This procedure takes approximately one hour. There are no restrictions for this procedure.   Referrals:  none  Special Instructions // Education:  LKathlee Nationsour Advanced Heart Failure Advocate will be in contact with you about getting assistance for your new medication  Follow-Up in: 3 months  [facility] you and your health needs are our priority. We have a designated team specialized in the treatment of Heart Failure. This Care Team includes your primary Heart Failure Specialized Cardiologist (physician), Advanced Practice Providers (APPs- Physician Assistants and Nurse Practitioners), and Pharmacist who all work together to provide you with the care you need, when you need it.   You may see any of the following providers on your designated Care Team at your next follow up:  Dr DGlori BickersDr DHaynes Kerns NP BLyda Jester [facility] PUtahLAudry Riles PharmD   Please be sure to bring in all your medications bottles to every appointment.   Need to Contact [place]  If you have any questions or concerns before your next appointment please send [place] message through [place] call our office at [phone]    TO LEAVE A MESSAGE FOR THE NURSE SELECT OPTION 2, PLEASE LEAVE A MESSAGE INCLUDING: YOUR NAME DATE OF BIRTH CALL BACK NUMBER REASON FOR CALL**this is important as we prioritize the call backs  YOU WILL  RECEIVE A CALL BACK THE SAME DAY AS LONG AS YOU CALL BEFORE 4:00 PM

## 2021-08-05 NOTE — Progress Notes (Signed)
Date:  08/05/2021   ID:  Vicki [name], DOB [birth year], MRN [id number]   Provider location: [facility] Type of Visit: Established patient   PCP:  Deland Pretty, MD  Cardiologist:  Dr. Aundra Dubin   History of Present Illness: Vicki [name] is a 81 y.o. female who has a history of chronic diastolic CHF, pulmonary hypertension and chronic atrial fibrillation.  Patient was followed by Dr. Glade Lloyd in the past for chronic atrial fibrillation.  She has been on coumadin.  She reports progressive exertional dyspnea since 2011.  This gradually worsened and became quite significant over the last few months.  She used to have significant HTN, but more recently her BP has been on the lower side.  Echo was done in 2/14, showing severe concentric LVH with EF 55-60%, moderately dilated RV, moderate to severe TR, and PA systolic pressure 86 mmHg.  I did a right heart cath in 5/14.  This showed PA pressure 104/36 with PCWP 20, suggesting pulmonary arterial HTN well out of proportion to the mildly elevate wedge pressure.  She was already on amlodipine so I did not do vasodilator testing.  V/Q scan was done, showing no evidence for chronic PEs.  PFTs showed a restrictive defect. Cardiac MRI did not show definite evidence for amyloid.  I started her on macitentan 10 mg daily.  Initially, she felt better on macitentan.  However, she was admitted in 5/14 from her sleep study due to orthopnea and dyspnea.  She was diuresed for several days and diuretic was switched over to torsemide.  I next started her on tadalafil 20 mg daily and titrated up to 40 mg daily.  She thinks that this helped.  She saw pulmonary after chest CT (showed mosaic attenuation in lungs).  This was thought to be due to air-trapping rather than ILD.  She was started on Spiriva. She wears oxygen at home.    She had an echocardiogram in 2/15 that showed severe LVH, EF 75%, small pericardial effusion, mildly dilated RV with mildly  decreased systolic function, PA systolic pressure 84 mmHg.  She is not totally sure if she was taking macitentan and tadalafil at that time.  She has had a hard month.  She ran out of her macitentan and tadalafil and her insurance company refused to refill them.  After this, she took a decided turn for the worse.  [place] in the fall.  She became much more short of breath, just with walking around the house. She developed lightheaded spells, especially with micturation.  Given lightheadedness and presyncope, she was admitted last week.  She was restarted on her medications and she finally got back on her meds at home.  In the hospital, she was noted to be bradycardic with HR to 30s so metoprolol was stopped.  Of note, her abdominal fat pad biopsy did not suggest amyloidosis. Repeat RHC in 3/15 still with moderate to severe PAH and low CI.  Holter off beta blocker in 3/15 showed average HR 73.    Echo in 9/17 showed EF 65-70% with moderate to severe LVH, moderate MR, normal RV size with mildly decreased systolic function, PASP 56 mmHg, small pericardial effusion.    Echo in 10/18 showed EF 60-65% with moderate LVH, moderate MR, severe biatrial enlargement, PASP 62 mmHg, RV normal.  PYP scan in 10/18 was not suggestive of TTR amyloidosis.  Echo in 10/19 showed EF 65-70%, moderate MR, normal RV size and systolic function, PASP 43 mmHg.   Echo in 12/20 showed EF 65-70%, moderate LVH, severe biatrial enlargement, mild-moderate MR, small pericardial effusion, unable to estimate PA systolic pressure.   PYP scan in 12/20 was visually grade 2 with H/CL 1.96 but activity appeared localized to the atria. Inv[facility]ae gene testing for TTR amyloidosis in 3/21 was negative.   Echo in 2/22 showed EF 60-65%, moderate LVH, normal RV, PASP 37 mmHg, moderate central MR.    She returns for followup of CHF and pulmonary hypertension. On Tyvaso,  tadalafil, and macitentan for pulmonary hypertension, able to get all meds without problems.  Weight is down 3 lbs.  BP high today, but SBP generally < 130 at home.  Stable 6 minutes walk today.  Occasional palpitations. No dyspnea walking on flat ground.  Rare atypical chest pain.  No lightheadedness.   ECG (personally reviewed): atrial fibrillation, nonspecific T wave changes.        6 minute walk (5/14): 122 m.   6 minute walk (7/14): 152 m 6 minute walk (10/14): 183 m 6 minute walk (4/15): 317 m 6 minute walk (12/15): 259 m 6 minute walk (4/16): 293 m 6 minute walk (8/16): 341 m 6 minute walk (12/16): 274 m 6 minute walk (6/17): 314 m 6 minute walk (9/17): 307 m 6 minute walk (4/18): 366 m 6 minute walk (10/18): 320 m 6 minute walk (3/19): 381 m 6 minute walk (9/19): 366 m 6 minute walk (9/20): 305 m 6 minute walk (12/20): 243 m 6 minute walk (6/21): 304 m 6 minute walk (11/21): 323 m 6 minute walk (2/22): 305 m 6 minute walk (5/22): 366 m 6 minute walk (1/23): 366 m   Labs (12/13): HCT 45.1, plts 153, K 3.5, creatinine 1.0 Labs (1/14): BNP 849 Labs (4/14): K 3.1, creatinine 1.0, ANA and anti-SCL-70 antibody negative.  Rheumatoid factor negative.  Serum immunofixation did not show monoclonal light chains.  Labs (5/14): K 3.3, creatinine 0.79, proBNP 5147 Labs (6/14): K 4, creatinine 0.9, BNP 1001 Labs (10/14): LDL 53, LDL-P 975, K 3.7, creatinine 1.2 Labs (11/14): K 3.7, creatinine 0.9, BNP 832 Labs (2/15): K 3.5, creatinine 1.10, HCT 42.6, UPEP negative, SPEP negative.  Labs (3/15): K 3.8, creatinine 0.88 Labs (4/15): K 3.7, creatinine 0.99, proBNP 1653 Labs (7/15): K 4, creatinine 0.93 Labs (12/15): LDL 77, HDL 58, K 3.9, creatinine 1.03, LFTs normal Labs (4/16): K 3.8, creatinine 0.93, BNP 475, plts 105, HCT 42.3 Labs (11/16): K 3.6, creatinine 1.1, HCT 42.2 Labs (12/16): LDL 54, HDL 50, BNP 628 Labs (6/17): K 3.7, creatinine 0.97, HCT 41.3, BNP 489 Labs (9/17): K  3.8, creatinine 0.97, LDL 65, HDL 54 Labs (9/18): K 4.1, creatinine 1.05, hgb 14.6, BNP 646 Labs (10/18): BNP 588, TSH normal, LDL 73, HDL 48, K 3.5, creatinine 0.97, hgb 12.5, plts 91, LFTs normal Labs (3/19): K 4.1, creatinine 1.07, hgb 13.2 Labs (9/19): K 3.8, creatinine 1.1 Labs (7/20): K 3.9, creatinine 1.3, LDL 63 Labs (9/20): K 4.2, creatinine 1.05, hgb 12.6, BNP 632  Labs (12/20): Myeloma panel negative, urine immunofixation negative.  Labs (1/21): K 4.3, creatinine 1.12, LDL 63 Labs (3/21): K 3.8, creatinine 1.13 Labs (6/21): K 3.9, creatinine 1.16 Labs (11/21): LDL 63, K 4.1, creatinine 1.19 Labs (2/22): K 4.3, creatinine 1.14, hgb 11.5, BNP 915 Labs (5/22): LDL 65 Labs (6/22): K 4, creatinine 1.16 Labs (8/22): K 3.6, creatinine 1.05, BNP 720  PMH: 1. Chronic diastolic CHF: Echo (1/50) with EF 55-60%, severe LVH (no SAM, no asymmetric hypertrophy, no LVOT gradient), moderate-severe LAE, moderately dilated RV with mildly decreased systolic function, moderate to severe RAE, PA systolic pressure 86 mmHg, moderate-severe TR, moderate MR, trivial pericardial effusion.  Cardiac MRI (5/14): EF 65%, severe LVH, no definite evidence for amyloidosis (no delayed enhancement, myocardium not difficult to null).  Echo (2/15) with EF 75%, severe LVH, grade II diastolic dysfunction, moderate MR, RV mildly dilated with mildly decreased systolic function, moderate TR, PA systolic pressure 84 mmHg, small pericardial effusion. Abdominal fat pad biopsy (2/15) showed no evidence for amyloidosis.  SPEP/UPEP negative 2/15.  - Echo (4/16) with EF 60-65%, severe LVH, RV mildly dilated with mildly decreased systolic function, PA systolic pressure 40 mmHg. - Echo (9/17) with EF 65-70%, severe LVH, moderate MR, normal RV size with mildly decreased systolic function, PASP 56 mmHg, small pericardial effusion.  - Echo (10/18): EF 60-65% with moderate LVH, moderate MR, severe biatrial enlargement, PASP 62 mmHg, RV  normal. - PYP scan (10/18) was not suggestive of TTR amyloidosis.  - Echo (10/19): EF 65-70%, moderate MR, normal RV size and systolic function, PASP 43 mmHg. - Echo (12/20): EF 65-70%, moderate LVH, severe biatrial enlargement, mild-moderate MR, small pericardial effusion, unable to estimate PA systolic pressure.  - PYP scan (12/20): grade 2 with H/CL 1.96 but activity localized to the atria.  - [employer] gene testing for TTR amyloidosis in 3/21 was negative.  - Echo (2/22): EF 60-65%, moderate LVH, normal RV, PASP 37 mmHg, moderate central MR. - PYP scan (9/22): grade 2, H/CL 1.28 => isolated atrial uptake.  2. Chronic atrial fibrillation since around 2004.  Developed bradycardia and metoprolol stopped 2/15. Holter (3/15) with average HR 73, atrial fibrillation, 3.8 sec pause x 1 while asleep, PVCs.  3. HTN: For decades.  4. LHC (2/08) with no significant disease.  5. Chronic thrombocytopenia: ITP 6. Pulmonary arterial HTN: RHC (5/14) with mean RA 13, PA 104/36 (mean 63), mean PCWP 20 on right and 23 on left, CI 2.3 (Fick) and 1.6 (thermo), PVR 10.4 WU (Fick) and 15 WU (thermo).  Vasodilator testing not done as patient was already on amlodipine.  V/Q scan (5/14) with no evidence for chronic PE.  ANA, RF, and anti-SCL70 antibody negative.  PFTs (5/14) with FEV1 60%, FVC 54%, ratio 112%, TLC 61%, DLCO 43% => restrictive defect.  Sleep study (7/14) with no OSA.  CT chest with areas of mosaic attenuation in lungs (saw pulmonary, thought air trapping and not ILD). RHC (3/15) with RA mean 5, PA 66/31 mean 43, PCWP mean 11, Cardiac Index (Fick) 1.97, PVR 9.2 WU.  Patient was started on Tyvaso in 3/15.  Echo (4/16) with mildly dilated and mildly dysfunctional RV, PA systolic pressure 40 mmHg.  7. Chest pain: Cardiolite 11/21 with no ischemia.   Current Outpatient Medications  Medication Sig Dispense Refill   acetaminophen (TYLENOL) 500 MG tablet Take 500 mg by mouth every 6 (six) hours as needed for mild  pain or headache.      amLODipine (NORVASC) 2.5 MG tablet TAKE ONE TABLET BY MOUTH ONCE DAILY 30 tablet 0   Cholecalciferol (VITAMIN D3) 2000 units TABS Take 1 tablet by mouth daily.      KLOR-CON M20 20 MEQ tablet TAKE TWO TABLETS BY MOUTH IN THE MORNING AND ONE IN THE EVENING 90 tablet 0   levocetirizine (XYZAL) 5 MG tablet Take 2.5 mg by mouth daily in the  afternoon.     macitentan (OPSUMIT) 10 MG tablet TAKE 1 TABLET (10 MG TOTAL) BY MOUTH DAILY WITH BREAKFAST. 90 tablet 3   rosuvastatin (CRESTOR) 20 MG tablet Take 20 mg by mouth at bedtime.      tadalafil, PAH, (ADCIRCA) 20 MG tablet TAKE 2 TABLETS DAILY GENERIC FOR ADCIRCA 60 tablet 11   torsemide (DEMADEX) 20 MG tablet Take 1.5 tablets (30 mg total) by mouth 2 (two) times daily. 90 tablet 6   Treprostinil (TYVASO) 0.6 MG/ML SOLN Inhale 54 mcg into the lungs 4 (four) times daily. 30 mL 5   warfarin (COUMADIN) 5 MG tablet TAKE 1 TABLET BY MOUTH AS DIRECTED BY COUMADIN CLINIC 90 tablet 1   No current facility-administered medications for this encounter.    Allergies:   Patient has no known allergies.   Social History:  The patient  reports that she has never smoked. She has never used smokeless tobacco. She reports current alcohol use of about 3.0 standard drinks per week. She reports that she does not use drugs.   Family History:  The patient's family history includes Alzheimer's disease in her mother; Heart Problems in her brother and maternal aunt; Heart attack in her father; Hypertension in her brother; Pulmonary Hypertension in her cousin; Thyroid disease in her daughter, daughter, and maternal aunt.   ROS:  Please see the history of present illness.   All other systems are personally reviewed and negative.   Exam:   BP (!) 144/80    Pulse (!) 58    Wt 69.9 kg (154 lb)    SpO2 97%    BMI 28.17 kg/m   General: NAD Neck: No JVD, no thyromegaly or thyroid nodule.  Lungs: Clear to auscultation bilaterally with normal respiratory  effort. CV: Nondisplaced PMI.  Heart irregular S1/S2, no S3/S4, 3/6 SEM RUSB.  No peripheral edema.  No carotid bruit.  Normal pedal pulses.  Abdomen: Soft, nontender, no hepatosplenomegaly, no distention.  Skin: Intact without lesions or rashes.  Neurologic: Alert and oriented x 3.  Psych: Normal affect. Extremities: No clubbing or cyanosis.  HEENT: Normal.    Recent Labs: 08/09/2020: Hemoglobin 12.5; Platelets 72 08/05/2021: B Natriuretic Peptide 622.6; BUN 16; Creatinine, Ser 1.26; Potassium 3.8; Sodium 142  Personally reviewed   Wt Readings from Last 3 Encounters:  08/05/21 69.9 kg (154 lb)  02/26/21 71.6 kg (157 lb 12.8 oz)  11/15/20 73.4 kg (161 lb 12.8 oz)    ASSESSMENT AND PLAN:  1. Pulmonary HTN: Patient has severe pulmonary arterial HTN.  RHC in 3/15 showed CI 1.97, PVR 9. 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 Collagen vascular disease workup was negative (negative RF, ANA and negative anti-SCL-70).  V/Q scan was not suggestive of chronic PEs.  PFTs were suggestive of restrictive lung disease but CT chest and evaluation by pulmonary did not suggest interstitial lung disease.  Sleep study did not show OSA.  Echo in 3/22 showed normal RV size and systolic function, PASP estimation 37 mmHg.  She is stable symptomatically, NYHA class II.  6 minute walk stable today.  - Continue oxygen at night and with exertion as needed (has not needed during the day).  - Continue Tyvaso, macitentan, Adcirca.  She is doing well on this regimen and is no longer having trouble getting her meds. - BNP today.  - Repeat echo at followup in 3 months.  2. Chronic diastolic CHF: EF preserved on echo with moderate concentric LVH and a small pericardial effusion.  It is possible that the LVH is due to years of HTN.  The cardiac MRI was not definitively suggestive of cardiac amyloidosis.  I was still concerned for amyloidosis given the appearance of the LV myocardium on echoes.  Abdominal fat pad  biopsy in 2015 showed no evidence for amyloidosis. She has some symptoms suggestive of mild peripheral neuropathy.  PYP scan in 10/18 was not suggestive of TTR amyloidosis. I assessed her again for cardiac amyloidosis in 2020: myeloma panel and urine immunofixation negative, PYP scan in 12/20 was grade 2 with H/CL 1.97 but activity was localized to the atria.  [facility]ae gene testing for TTR amyloidosis was negative.  PYP scan repeated 9/22 was grade 2 with H/CL 1.28 => isolated atrial uptake again.  NYHA class II symptoms.  She is not volume overloaded.  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 This may be an earlier manifestation before LV involvement.  This can be associated with atrial fibrillation.  I am going to start her on tafamidis.   - Continue torsemide 30 mg bid. BMET today.   3. HTN: BP is controlled on home readings.  4. Chronic atrial fibrillation: Continue coumadin.  She is off metoprolol due to bradycardia. Holter off metoprolol did not show any high rate episodes.  5. Hyperlipidemia: On Crestor, check lipids today.   Followup in 3 months with echo.   Signed, [name], MD  08/05/2021  [facility] [address] [facility] [place] [address] [phone] (office) [phone] (fax)

## 2021-08-06 ENCOUNTER — Telehealth (HOSPITAL_COMMUNITY): Payer: Self-pay | Admitting: Pharmacist

## 2021-08-06 ENCOUNTER — Other Ambulatory Visit (HOSPITAL_COMMUNITY): Payer: Self-pay

## 2021-08-06 NOTE — Telephone Encounter (Signed)
Advanced Heart Failure Patient Advocate Encounter  Prior Authorization for Vicki [name] has been approved.    Effective dates: 06/29/21 through 06/28/22  Patients co-pay is $1,174.02/month  [name], PharmD, BCPS, BCCP, CPP Heart Failure Clinic Pharmacist [phone]

## 2021-08-06 NOTE — Telephone Encounter (Signed)
Patient Advocate Encounter   Received notification that prior authorization for Vyndamax is required.   PA submitted on CoverMyMeds Key [id number] Status is pending   Will continue to follow.   [name], PharmD, BCPS, BCCP, CPP Heart Failure Clinic Pharmacist [phone]

## 2021-08-14 ENCOUNTER — Telehealth (HOSPITAL_COMMUNITY): Payer: Self-pay | Admitting: Pharmacy Technician

## 2021-08-19 ENCOUNTER — Other Ambulatory Visit (HOSPITAL_COMMUNITY): Payer: Self-pay

## 2021-08-19 NOTE — Telephone Encounter (Signed)
Advanced Heart Failure Patient Advocate Encounter  Spoke with patient regarding Vyndamax co-pay. Placed patient on PAN amyloid wait list. Started application for VyndaLink assistance. Will mail application to the patient.

## 2021-08-21 NOTE — Telephone Encounter (Addendum)
Advanced Heart Failure Patient Advocate Encounter  The patient was approved for a [place] that will help cover the cost of Vyndamax. Total amount awarded, $10,000. Eligibility dates, 07/22/21-07/21/22. Called and left the patient a message to talk about mailing from the specialty pharmacy with the attached grant.  ID [id number] BIN [id number] PCN [id number] GROUP [id number]  Charlann Boxer, CPhT

## 2021-08-26 ENCOUNTER — Other Ambulatory Visit (HOSPITAL_COMMUNITY): Payer: Self-pay

## 2021-08-26 ENCOUNTER — Telehealth (HOSPITAL_COMMUNITY): Payer: Self-pay | Admitting: Pharmacist

## 2021-08-26 DIAGNOSIS — I5032 Chronic diastolic (congestive) heart failure: Secondary | ICD-10-CM | POA: Diagnosis not present

## 2021-08-26 DIAGNOSIS — I509 Heart failure, unspecified: Secondary | ICD-10-CM | POA: Diagnosis not present

## 2021-08-26 DIAGNOSIS — I130 Hypertensive heart and chronic kidney disease with heart failure and stage 1 through stage 4 chronic kidney disease, or unspecified chronic kidney disease: Secondary | ICD-10-CM | POA: Diagnosis not present

## 2021-08-26 DIAGNOSIS — N1831 Chronic kidney disease, stage 3a: Secondary | ICD-10-CM | POA: Diagnosis not present

## 2021-08-26 DIAGNOSIS — I482 Chronic atrial fibrillation, unspecified: Secondary | ICD-10-CM | POA: Diagnosis not present

## 2021-08-26 MED ORDER — VYNDAMAX 61 MG PO CAPS
1.0000 | ORAL_CAPSULE | Freq: Every day | ORAL | 11 refills | Status: DC
  Filled 2021-08-26: qty 30, fill #0

## 2021-08-26 NOTE — Telephone Encounter (Signed)
Vyndamax prescription sent to [facility].  [name], PharmD, BCPS, BCCP, CPP Heart Failure Clinic Pharmacist [phone]

## 2021-08-27 ENCOUNTER — Other Ambulatory Visit (HOSPITAL_COMMUNITY): Payer: Self-pay

## 2021-08-27 ENCOUNTER — Other Ambulatory Visit (HOSPITAL_COMMUNITY): Payer: Self-pay | Admitting: Pharmacist

## 2021-08-27 MED ORDER — VYNDAMAX 61 MG PO CAPS
1.0000 | ORAL_CAPSULE | Freq: Every day | ORAL | 3 refills | Status: DC
  Filled 2021-08-27: qty 90, fill #0
  Filled 2021-08-28: qty 90, 90d supply, fill #0
  Filled 2021-11-18: qty 90, 90d supply, fill #1
  Filled 2022-03-03: qty 30, 30d supply, fill #2
  Filled 2022-05-01: qty 30, 30d supply, fill #3
  Filled 2022-06-12: qty 30, 30d supply, fill #4
  Filled 2022-07-14: qty 30, 30d supply, fill #5
  Filled 2022-08-04 – 2022-08-07 (×2): qty 30, 30d supply, fill #6

## 2021-08-28 ENCOUNTER — Other Ambulatory Visit (HOSPITAL_COMMUNITY): Payer: Self-pay

## 2021-08-28 NOTE — Telephone Encounter (Signed)
Called and spoke with the patient. She is aware insurance is allowing 90 day billing and [facility] will reach out about 10 days before hand to set up the next 90 day refill. ? ?Charlann Boxer, CPhT ? ?

## 2021-08-29 ENCOUNTER — Other Ambulatory Visit (HOSPITAL_COMMUNITY): Payer: Self-pay

## 2021-09-02 ENCOUNTER — Other Ambulatory Visit: Payer: Self-pay

## 2021-09-02 ENCOUNTER — Ambulatory Visit: Payer: Medicare HMO | Admitting: *Deleted

## 2021-09-02 DIAGNOSIS — I4891 Unspecified atrial fibrillation: Secondary | ICD-10-CM

## 2021-09-02 DIAGNOSIS — Z5181 Encounter for therapeutic drug level monitoring: Secondary | ICD-10-CM | POA: Diagnosis not present

## 2021-09-02 LAB — POCT INR: INR: 2 (ref 2.0–3.0)

## 2021-09-02 NOTE — Patient Instructions (Signed)
Description   ?Continue taking Warfarin 1 tablet everyday. Recheck in 4 weeks. Call with any medications changes [phone]. ?  ?  ?

## 2021-09-06 ENCOUNTER — Other Ambulatory Visit: Payer: Self-pay | Admitting: Cardiology

## 2021-09-08 ENCOUNTER — Other Ambulatory Visit (HOSPITAL_COMMUNITY): Payer: Self-pay | Admitting: Pharmacist

## 2021-09-08 DIAGNOSIS — Z9861 Coronary angioplasty status: Secondary | ICD-10-CM

## 2021-09-08 DIAGNOSIS — I4891 Unspecified atrial fibrillation: Secondary | ICD-10-CM

## 2021-09-08 MED ORDER — OPSUMIT 10 MG PO TABS: ORAL_TABLET | ORAL | 3 refills | Status: DC

## 2021-09-08 NOTE — Telephone Encounter (Signed)
Prescription refill request received for warfarin ?Lov: 08/05/21 Vicki [name])  ?Next INR check: 09/30/21  ?Warfarin tablet strength: 61m ? ?Appropriate dose and refill sent to requested pharmacy.  ?

## 2021-09-23 DIAGNOSIS — I509 Heart failure, unspecified: Secondary | ICD-10-CM | POA: Diagnosis not present

## 2021-09-24 DIAGNOSIS — Z961 Presence of intraocular lens: Secondary | ICD-10-CM | POA: Diagnosis not present

## 2021-09-24 DIAGNOSIS — H35341 Macular cyst, hole, or pseudohole, right eye: Secondary | ICD-10-CM | POA: Diagnosis not present

## 2021-09-24 DIAGNOSIS — H35351 Cystoid macular degeneration, right eye: Secondary | ICD-10-CM | POA: Diagnosis not present

## 2021-09-24 DIAGNOSIS — H35371 Puckering of macula, right eye: Secondary | ICD-10-CM | POA: Diagnosis not present

## 2021-09-24 DIAGNOSIS — H35073 Retinal telangiectasis, bilateral: Secondary | ICD-10-CM | POA: Diagnosis not present

## 2021-09-26 DIAGNOSIS — N1831 Chronic kidney disease, stage 3a: Secondary | ICD-10-CM | POA: Diagnosis not present

## 2021-09-26 DIAGNOSIS — I130 Hypertensive heart and chronic kidney disease with heart failure and stage 1 through stage 4 chronic kidney disease, or unspecified chronic kidney disease: Secondary | ICD-10-CM | POA: Diagnosis not present

## 2021-09-26 DIAGNOSIS — I482 Chronic atrial fibrillation, unspecified: Secondary | ICD-10-CM | POA: Diagnosis not present

## 2021-09-26 DIAGNOSIS — I5032 Chronic diastolic (congestive) heart failure: Secondary | ICD-10-CM | POA: Diagnosis not present

## 2021-09-30 ENCOUNTER — Ambulatory Visit: Payer: Medicare HMO | Admitting: *Deleted

## 2021-09-30 DIAGNOSIS — I4891 Unspecified atrial fibrillation: Secondary | ICD-10-CM | POA: Diagnosis not present

## 2021-09-30 DIAGNOSIS — Z5181 Encounter for therapeutic drug level monitoring: Secondary | ICD-10-CM

## 2021-09-30 LAB — POCT INR: INR: 2.2 (ref 2.0–3.0)

## 2021-09-30 NOTE — Patient Instructions (Signed)
Description   ?Continue taking Warfarin 1 tablet everyday. Recheck in 5 weeks. Call with any medications changes [phone]. ?  ?  ?

## 2021-10-07 DIAGNOSIS — M21612 Bunion of left foot: Secondary | ICD-10-CM | POA: Diagnosis not present

## 2021-10-07 DIAGNOSIS — M79672 Pain in left foot: Secondary | ICD-10-CM | POA: Diagnosis not present

## 2021-10-07 DIAGNOSIS — M79671 Pain in right foot: Secondary | ICD-10-CM | POA: Diagnosis not present

## 2021-10-07 DIAGNOSIS — D485 Neoplasm of uncertain behavior of skin: Secondary | ICD-10-CM | POA: Diagnosis not present

## 2021-10-07 DIAGNOSIS — M21611 Bunion of right foot: Secondary | ICD-10-CM | POA: Diagnosis not present

## 2021-10-16 DIAGNOSIS — I10 Essential (primary) hypertension: Secondary | ICD-10-CM | POA: Diagnosis not present

## 2021-10-16 DIAGNOSIS — E7800 Pure hypercholesterolemia, unspecified: Secondary | ICD-10-CM | POA: Diagnosis not present

## 2021-10-16 DIAGNOSIS — D696 Thrombocytopenia, unspecified: Secondary | ICD-10-CM | POA: Diagnosis not present

## 2021-10-24 DIAGNOSIS — I509 Heart failure, unspecified: Secondary | ICD-10-CM | POA: Diagnosis not present

## 2021-10-24 DIAGNOSIS — B079 Viral wart, unspecified: Secondary | ICD-10-CM | POA: Diagnosis not present

## 2021-11-04 ENCOUNTER — Ambulatory Visit (INDEPENDENT_AMBULATORY_CARE_PROVIDER_SITE_OTHER): Payer: Medicare HMO

## 2021-11-04 DIAGNOSIS — Z5181 Encounter for therapeutic drug level monitoring: Secondary | ICD-10-CM | POA: Diagnosis not present

## 2021-11-04 DIAGNOSIS — I4891 Unspecified atrial fibrillation: Secondary | ICD-10-CM | POA: Diagnosis not present

## 2021-11-04 LAB — POCT INR: INR: 2.9 (ref 2.0–3.0)

## 2021-11-04 NOTE — Patient Instructions (Signed)
Description   ?Continue taking Warfarin 1 tablet everyday. Recheck in 6 weeks. Call with any medications changes [phone]. ?  ?  ?

## 2021-11-11 ENCOUNTER — Ambulatory Visit (HOSPITAL_COMMUNITY)
Admission: RE | Admit: 2021-11-11 | Discharge: 2021-11-11 | Disposition: A | Discharge status: Home / Self Care | Payer: Medicare HMO | Source: Ambulatory Visit | Attending: Cardiology | Admitting: Cardiology

## 2021-11-11 ENCOUNTER — Encounter (HOSPITAL_COMMUNITY): Payer: Self-pay | Admitting: Cardiology

## 2021-11-11 ENCOUNTER — Ambulatory Visit (HOSPITAL_BASED_OUTPATIENT_CLINIC_OR_DEPARTMENT_OTHER)
Admission: RE | Admit: 2021-11-11 | Discharge: 2021-11-11 | Disposition: A | Discharge status: Home / Self Care | Payer: Medicare HMO | Source: Ambulatory Visit | Attending: Cardiology | Admitting: Cardiology

## 2021-11-11 VITALS — BP 150/80 | HR 51 | Wt 148.2 lb

## 2021-11-11 DIAGNOSIS — I482 Chronic atrial fibrillation, unspecified: Secondary | ICD-10-CM | POA: Diagnosis not present

## 2021-11-11 DIAGNOSIS — Z79899 Other long term (current) drug therapy: Secondary | ICD-10-CM | POA: Insufficient documentation

## 2021-11-11 DIAGNOSIS — I3139 Other pericardial effusion (noninflammatory): Secondary | ICD-10-CM | POA: Diagnosis not present

## 2021-11-11 DIAGNOSIS — I272 Pulmonary hypertension, unspecified: Secondary | ICD-10-CM | POA: Diagnosis not present

## 2021-11-11 DIAGNOSIS — E854 Organ-limited amyloidosis: Secondary | ICD-10-CM | POA: Diagnosis not present

## 2021-11-11 DIAGNOSIS — I5032 Chronic diastolic (congestive) heart failure: Secondary | ICD-10-CM | POA: Insufficient documentation

## 2021-11-11 DIAGNOSIS — I110 Hypertensive heart disease with heart failure: Secondary | ICD-10-CM | POA: Insufficient documentation

## 2021-11-11 DIAGNOSIS — E785 Hyperlipidemia, unspecified: Secondary | ICD-10-CM | POA: Insufficient documentation

## 2021-11-11 DIAGNOSIS — Z7901 Long term (current) use of anticoagulants: Secondary | ICD-10-CM | POA: Diagnosis not present

## 2021-11-11 DIAGNOSIS — I43 Cardiomyopathy in diseases classified elsewhere: Secondary | ICD-10-CM | POA: Diagnosis not present

## 2021-11-11 LAB — BASIC METABOLIC PANEL
Anion gap: 7 (ref 5–15)
BUN: 13 mg/dL (ref 8–23)
CO2: 30 mmol/L (ref 22–32)
Calcium: 10.1 mg/dL (ref 8.9–10.3)
Chloride: 104 mmol/L (ref 98–111)
Creatinine, Ser: 1.18 mg/dL — ABNORMAL HIGH (ref 0.44–1.00)
GFR, Estimated: 47 mL/min — ABNORMAL LOW (ref >60)
Glucose, Bld: 94 mg/dL (ref 70–99)
Potassium: 3.5 mmol/L (ref 3.5–5.1)
Sodium: 141 mmol/L (ref 135–145)

## 2021-11-11 LAB — ECHOCARDIOGRAM COMPLETE: S' Lateral: 2.7 cm

## 2021-11-11 LAB — BRAIN NATRIURETIC PEPTIDE: B Natriuretic Peptide: 477.3 pg/mL — ABNORMAL HIGH (ref 0.0–100.0)

## 2021-11-11 MED ORDER — AMLODIPINE BESYLATE 5 MG PO TABS: 5.0000 mg | ORAL_TABLET | Freq: Every day | ORAL | 3 refills | Status: DC

## 2021-11-11 NOTE — Progress Notes (Signed)
?  Echocardiogram ?2D Echocardiogram has been performed. ? ?Vicki [name] ?11/11/2021, 1:44 PM ?

## 2021-11-11 NOTE — Progress Notes (Signed)
Six minute walk completed ?Patient ambulated 1030 feet (313.10m without difficulties or rest breaks  ?HR ranged 67-100 ?O2 sats ranged 92-97% on room air ?

## 2021-11-11 NOTE — Progress Notes (Signed)
?  ? ?Date:  11/11/2021  ? ?ID:  Vicki [name], DOB 02/22/1941, MRN [id number]   ?Provider location: [facility] ?Type of Visit: Established patient  ? ?PCP:  Deland Pretty, MD  ?Cardiologist:  Dr. Aundra Dubin ?  ?History of Present Illness: ?Vicki [name] is a 81 y.o. female who has a history of chronic diastolic CHF, pulmonary hypertension and chronic atrial fibrillation.  Patient was followed by Dr. Glade Lloyd in the past for chronic atrial fibrillation.  She has been on coumadin.  She reports progressive exertional dyspnea since 2011.  This gradually worsened and became quite significant over the last few months.  She used to have significant HTN, but more recently her BP has been on the lower side.  Echo was done in 2/14, showing severe concentric LVH with EF 55-60%, moderately dilated RV, moderate to severe TR, and PA systolic pressure 86 mmHg.  I did a right heart cath in 5/14.  This showed PA pressure 104/36 with PCWP 20, suggesting pulmonary arterial HTN well out of proportion to the mildly elevate wedge pressure.  She was already on amlodipine so I did not do vasodilator testing.  V/Q scan was done, showing no evidence for chronic PEs.  PFTs showed a restrictive defect. Cardiac MRI did not show definite evidence for amyloid.  I started her on macitentan 10 mg daily.  Initially, she felt better on macitentan.  However, she was admitted in 5/14 from her sleep study due to orthopnea and dyspnea.  She was diuresed for several days and diuretic was switched over to torsemide.  I next started her on tadalafil 20 mg daily and titrated up to 40 mg daily.  She thinks that this helped.  She saw pulmonary after chest CT (showed mosaic attenuation in lungs).  This was thought to be due to air-trapping rather than ILD.  She was started on Spiriva. She wears oxygen at home.  ?  ?She had an echocardiogram in 2/15 that showed severe LVH, EF 75%, small pericardial effusion, mildly dilated RV with mildly  decreased systolic function, PA systolic pressure 84 mmHg.  She is not totally sure if she was taking macitentan and tadalafil at that time.  She has had a hard month.  She ran out of her macitentan and tadalafil and her insurance company refused to refill them.  After this, she took a decided turn for the worse.  [place] in the fall.  She became much more short of breath, just with walking around the house. She developed lightheaded spells, especially with micturation.  Given lightheadedness and presyncope, she was admitted last week.  She was restarted on her medications and she finally got back on her meds at home.  In the hospital, she was noted to be bradycardic with HR to 30s so metoprolol was stopped.  Of note, her abdominal fat pad biopsy did not suggest amyloidosis. Repeat RHC in 3/15 still with moderate to severe PAH and low CI.  Holter off beta blocker in 3/15 showed average HR 73.  ?  ?Echo in 9/17 showed EF 65-70% with moderate to severe LVH, moderate MR, normal RV size with mildly decreased systolic function, PASP 56 mmHg, small pericardial effusion.  ?  ?Echo in 10/18 showed EF 60-65% with moderate LVH, moderate MR, severe biatrial enlargement, PASP 62 mmHg, RV normal.  PYP scan in 10/18 was not suggestive of TTR amyloidosis.  ? ?  Echo in 10/19 showed EF 65-70%, moderate MR, normal RV size and systolic function, PASP 43 mmHg.  ? ?Echo in 12/20 showed EF 65-70%, moderate LVH, severe biatrial enlargement, mild-moderate MR, small pericardial effusion, unable to estimate PA systolic pressure.  ? ?PYP scan in 12/20 was visually grade 2 with H/CL 1.96 but activity appeared localized to the atria. [employer] gene testing for TTR amyloidosis in 3/21 was negative.  ? ?Echo in 2/22 showed EF 60-65%, moderate LVH, normal RV, PASP 37 mmHg, moderate central MR.  PYP scan repeated 9/22 was grade 2 with H/CL 1.28 => isolated atrial uptake  again. ? ?Echo was done today and reviewed, EF 60-65%, severe concentric LVH, normal RV, PASP 49 mmHg, moderate MR, small pericardial effusion.  ?  ?She returns for followup of CHF and pulmonary hypertension. On Tyvaso, tadalafil, and macitentan for pulmonary hypertension, able to get all meds without problems.  Weight down 6 lbs.  Now on tafamidis for cardiac amyloidosis.  Occasional atypical chest pain.  No dyspnea walking on flat ground or walking up stairs.  No lightheadedness.  BP running high.   ? ?ECG (personally reviewed): atrial fibrillation, anterolateral TWIs      ?  ?6 minute walk (5/14): 122 m.   ?6 minute walk (7/14): 152 m ?6 minute walk (10/14): 183 m ?6 minute walk (4/15): 317 m ?6 minute walk (12/15): 259 m ?6 minute walk (4/16): 293 m ?6 minute walk (8/16): 341 m ?6 minute walk (12/16): 274 m ?6 minute walk (6/17): 314 m ?6 minute walk (9/17): 307 m ?6 minute walk (4/18): 366 m ?6 minute walk (10/18): 320 m ?6 minute walk (3/19): 381 m ?6 minute walk (9/19): 366 m ?6 minute walk (9/20): 305 m ?6 minute walk (12/20): 243 m ?6 minute walk (6/21): 304 m ?6 minute walk (11/21): 323 m ?6 minute walk (2/22): 305 m ?6 minute walk (5/22): 366 m ?6 minute walk (1/23): 366 m ?6 minute walk (5/23): 314 m ?  ?Labs (12/13): HCT 45.1, plts 153, K 3.5, creatinine 1.0 ?Labs (1/14): BNP 849 ?Labs (4/14): K 3.1, creatinine 1.0, ANA and anti-SCL-70 antibody negative.  Rheumatoid factor negative.  Serum immunofixation did not show monoclonal light chains.  ?Labs (5/14): K 3.3, creatinine 0.79, proBNP 5147 ?Labs (6/14): K 4, creatinine 0.9, BNP 1001 ?Labs (10/14): LDL 53, LDL-P 975, K 3.7, creatinine 1.2 ?Labs (11/14): K 3.7, creatinine 0.9, BNP 832 ?Labs (2/15): K 3.5, creatinine 1.10, HCT 42.6, UPEP negative, SPEP negative.  ?Labs (3/15): K 3.8, creatinine 0.88 ?Labs (4/15): K 3.7, creatinine 0.99, proBNP 1653 ?Labs (7/15): K 4, creatinine 0.93 ?Labs (12/15): LDL 77, HDL 58, K 3.9, creatinine 1.03, LFTs normal ?Labs  (4/16): K 3.8, creatinine 0.93, BNP 475, plts 105, HCT 42.3 ?Labs (11/16): K 3.6, creatinine 1.1, HCT 42.2 ?Labs (12/16): LDL 54, HDL 50, BNP 628 ?Labs (6/17): K 3.7, creatinine 0.97, HCT 41.3, BNP 489 ?Labs (9/17): K 3.8, creatinine 0.97, LDL 65, HDL 54 ?Labs (9/18): K 4.1, creatinine 1.05, hgb 14.6, BNP 646 ?Labs (10/18): BNP 588, TSH normal, LDL 73, HDL 48, K 3.5, creatinine 0.97, hgb 12.5, plts 91, LFTs normal ?Labs (3/19): K 4.1, creatinine 1.07, hgb 13.2 ?Labs (9/19): K 3.8, creatinine 1.1 ?Labs (7/20): K 3.9, creatinine 1.3, LDL 63 ?Labs (9/20): K 4.2, creatinine 1.05, hgb 12.6, BNP 632  ?Labs (12/20): Myeloma panel negative, urine immunofixation negative.  ?Labs (1/21): K 4.3, creatinine 1.12, LDL 63 ?Labs (3/21): K 3.8, creatinine 1.13 ?Labs (6/21): K 3.9,  creatinine 1.16 ?Labs (11/21): LDL 63, K 4.1, creatinine 1.19 ?Labs (2/22): K 4.3, creatinine 1.14, hgb 11.5, BNP 915 ?Labs (5/22): LDL 65 ?Labs (6/22): K 4, creatinine 1.16 ?Labs (8/22): K 3.6, creatinine 1.05, BNP 720 ?Labs (2/23): K 3.8, creatinine 1.26, LDL 62, BNP 623 ?  ?PMH: ?1. Chronic diastolic CHF: Echo (7/32) with EF 55-60%, severe LVH (no SAM, no asymmetric hypertrophy, no LVOT gradient), moderate-severe LAE, moderately dilated RV with mildly decreased systolic function, moderate to severe RAE, PA systolic pressure 86 mmHg, moderate-severe TR, moderate MR, trivial pericardial effusion.  Cardiac MRI (5/14): EF 65%, severe LVH, no definite evidence for amyloidosis (no delayed enhancement, myocardium not difficult to null).  Echo (2/15) with EF 75%, severe LVH, grade II diastolic dysfunction, moderate MR, RV mildly dilated with mildly decreased systolic function, moderate TR, PA systolic pressure 84 mmHg, small pericardial effusion. Abdominal fat pad biopsy (2/15) showed no evidence for amyloidosis.  SPEP/UPEP negative 2/15.  ?- Echo (4/16) with EF 60-65%, severe LVH, RV mildly dilated with mildly decreased systolic function, PA systolic pressure  40 mmHg. ?- Echo (9/17) with EF 65-70%, severe LVH, moderate MR, normal RV size with mildly decreased systolic function, PASP 56 mmHg, small pericardial effusion.  ?- Echo (10/18): EF 60-65% with moderate LVH, m

## 2021-11-11 NOTE — Patient Instructions (Signed)
INCREASE Amlodipine to 5 mg one tab daily ? ?Labs today ?We will only contact you if something comes back abnormal or we need to make some changes. ?Otherwise no news is good news! ? ?Your physician recommends that you schedule a follow-up appointment in: 4 months with Dr Aundra Dubin ? ?Do the following things EVERYDAY: ?Weigh yourself in the morning before breakfast. Write it down and keep it in a log. ?Take your medicines as prescribed ?Eat low salt foods--Limit salt (sodium) to 2000 mg per day.  ?Stay as active as you can everyday ?Limit all fluids for the day to less than 2 liters ? ?At the [facility], you and your health needs are our priority. As part of our continuing mission to provide you with exceptional heart care, we have created designated Provider Care Teams. These Care Teams include your primary Cardiologist (physician) and Advanced Practice Providers (APPs- Physician Assistants and Nurse Practitioners) who all work together to provide you with the care you need, when you need it.  ? ?You may see any of the following providers on your designated Care Team at your next follow up: ?Dr Glori Bickers ?Dr [name] ?Darrick Grinder, NP ?Lyda Jester, PA ?Jessica Milford,NP ?Marlyce Huge, PA ?[name], PharmD ? ? ?Please be sure to bring in all your medications bottles to every appointment.  ? ?If you have any questions or concerns before your next appointment please send [place] a message through [place] or call our office at [phone].   ? ?TO LEAVE A MESSAGE FOR THE NURSE SELECT OPTION 2, PLEASE LEAVE A MESSAGE INCLUDING: ?YOUR NAME ?DATE OF BIRTH ?CALL BACK NUMBER ?REASON FOR CALL**this is important as we prioritize the call backs ? ?YOU WILL RECEIVE A CALL BACK THE SAME DAY AS LONG AS YOU CALL BEFORE 4:00 PM ? ? ?

## 2021-11-12 DIAGNOSIS — B079 Viral wart, unspecified: Secondary | ICD-10-CM | POA: Diagnosis not present

## 2021-11-18 ENCOUNTER — Other Ambulatory Visit (HOSPITAL_COMMUNITY): Payer: Self-pay

## 2021-11-18 DIAGNOSIS — I5032 Chronic diastolic (congestive) heart failure: Secondary | ICD-10-CM | POA: Diagnosis not present

## 2021-11-18 DIAGNOSIS — Z23 Encounter for immunization: Secondary | ICD-10-CM | POA: Diagnosis not present

## 2021-11-18 DIAGNOSIS — D6869 Other thrombophilia: Secondary | ICD-10-CM | POA: Diagnosis not present

## 2021-11-18 DIAGNOSIS — I10 Essential (primary) hypertension: Secondary | ICD-10-CM | POA: Diagnosis not present

## 2021-11-18 DIAGNOSIS — N1832 Chronic kidney disease, stage 3b: Secondary | ICD-10-CM | POA: Diagnosis not present

## 2021-11-18 DIAGNOSIS — Z0000 Encounter for general adult medical examination without abnormal findings: Secondary | ICD-10-CM | POA: Diagnosis not present

## 2021-11-18 DIAGNOSIS — F419 Anxiety disorder, unspecified: Secondary | ICD-10-CM | POA: Diagnosis not present

## 2021-11-18 DIAGNOSIS — E559 Vitamin D deficiency, unspecified: Secondary | ICD-10-CM | POA: Diagnosis not present

## 2021-11-18 DIAGNOSIS — I482 Chronic atrial fibrillation, unspecified: Secondary | ICD-10-CM | POA: Diagnosis not present

## 2021-11-18 DIAGNOSIS — I272 Pulmonary hypertension, unspecified: Secondary | ICD-10-CM | POA: Diagnosis not present

## 2021-11-18 DIAGNOSIS — E7800 Pure hypercholesterolemia, unspecified: Secondary | ICD-10-CM | POA: Diagnosis not present

## 2021-11-20 ENCOUNTER — Encounter (HOSPITAL_COMMUNITY): Payer: Self-pay | Admitting: Cardiology

## 2021-11-20 ENCOUNTER — Other Ambulatory Visit (HOSPITAL_COMMUNITY): Payer: Self-pay

## 2021-11-20 ENCOUNTER — Encounter (HOSPITAL_COMMUNITY): Payer: Self-pay

## 2021-11-23 DIAGNOSIS — I509 Heart failure, unspecified: Secondary | ICD-10-CM | POA: Diagnosis not present

## 2021-11-26 DIAGNOSIS — I482 Chronic atrial fibrillation, unspecified: Secondary | ICD-10-CM | POA: Diagnosis not present

## 2021-11-26 DIAGNOSIS — N1831 Chronic kidney disease, stage 3a: Secondary | ICD-10-CM | POA: Diagnosis not present

## 2021-11-26 DIAGNOSIS — I5032 Chronic diastolic (congestive) heart failure: Secondary | ICD-10-CM | POA: Diagnosis not present

## 2021-11-26 DIAGNOSIS — I130 Hypertensive heart and chronic kidney disease with heart failure and stage 1 through stage 4 chronic kidney disease, or unspecified chronic kidney disease: Secondary | ICD-10-CM | POA: Diagnosis not present

## 2021-11-26 DIAGNOSIS — B079 Viral wart, unspecified: Secondary | ICD-10-CM | POA: Diagnosis not present

## 2021-12-24 DIAGNOSIS — I509 Heart failure, unspecified: Secondary | ICD-10-CM | POA: Diagnosis not present

## 2021-12-26 DIAGNOSIS — I130 Hypertensive heart and chronic kidney disease with heart failure and stage 1 through stage 4 chronic kidney disease, or unspecified chronic kidney disease: Secondary | ICD-10-CM | POA: Diagnosis not present

## 2021-12-26 DIAGNOSIS — I482 Chronic atrial fibrillation, unspecified: Secondary | ICD-10-CM | POA: Diagnosis not present

## 2021-12-26 DIAGNOSIS — N1831 Chronic kidney disease, stage 3a: Secondary | ICD-10-CM | POA: Diagnosis not present

## 2021-12-26 DIAGNOSIS — I5032 Chronic diastolic (congestive) heart failure: Secondary | ICD-10-CM | POA: Diagnosis not present

## 2022-01-23 DIAGNOSIS — I509 Heart failure, unspecified: Secondary | ICD-10-CM | POA: Diagnosis not present

## 2022-01-26 DIAGNOSIS — I482 Chronic atrial fibrillation, unspecified: Secondary | ICD-10-CM | POA: Diagnosis not present

## 2022-01-26 DIAGNOSIS — I5032 Chronic diastolic (congestive) heart failure: Secondary | ICD-10-CM | POA: Diagnosis not present

## 2022-01-26 DIAGNOSIS — N1831 Chronic kidney disease, stage 3a: Secondary | ICD-10-CM | POA: Diagnosis not present

## 2022-01-26 DIAGNOSIS — I130 Hypertensive heart and chronic kidney disease with heart failure and stage 1 through stage 4 chronic kidney disease, or unspecified chronic kidney disease: Secondary | ICD-10-CM | POA: Diagnosis not present

## 2022-01-27 ENCOUNTER — Ambulatory Visit (INDEPENDENT_AMBULATORY_CARE_PROVIDER_SITE_OTHER): Payer: Medicare HMO

## 2022-01-27 DIAGNOSIS — I4891 Unspecified atrial fibrillation: Secondary | ICD-10-CM

## 2022-01-27 DIAGNOSIS — Z5181 Encounter for therapeutic drug level monitoring: Secondary | ICD-10-CM

## 2022-01-27 LAB — POCT INR: INR: 4.1 — AB (ref 2.0–3.0)

## 2022-01-27 NOTE — Patient Instructions (Signed)
HOLD TONIGHT ONLY and then Continue taking Warfarin 1 tablet everyday. Recheck in 2 weeks. Call with any medications changes [phone].

## 2022-02-05 ENCOUNTER — Other Ambulatory Visit (HOSPITAL_COMMUNITY): Payer: Self-pay

## 2022-02-13 ENCOUNTER — Ambulatory Visit: Payer: Medicare HMO | Admitting: *Deleted

## 2022-02-13 DIAGNOSIS — Z5181 Encounter for therapeutic drug level monitoring: Secondary | ICD-10-CM

## 2022-02-13 DIAGNOSIS — I4891 Unspecified atrial fibrillation: Secondary | ICD-10-CM

## 2022-02-13 LAB — POCT INR: INR: 4.2 — AB (ref 2.0–3.0)

## 2022-02-13 NOTE — Patient Instructions (Signed)
Description   Hold warfarin today and then START taking warfarin 1 tablet daily except for 1/2 a tablet on Mondays. Recheck INR in 2 weeks. [facility] [phone].

## 2022-02-23 DIAGNOSIS — I509 Heart failure, unspecified: Secondary | ICD-10-CM | POA: Diagnosis not present

## 2022-02-26 DIAGNOSIS — N1831 Chronic kidney disease, stage 3a: Secondary | ICD-10-CM | POA: Diagnosis not present

## 2022-02-26 DIAGNOSIS — I5032 Chronic diastolic (congestive) heart failure: Secondary | ICD-10-CM | POA: Diagnosis not present

## 2022-02-26 DIAGNOSIS — I482 Chronic atrial fibrillation, unspecified: Secondary | ICD-10-CM | POA: Diagnosis not present

## 2022-02-26 DIAGNOSIS — I130 Hypertensive heart and chronic kidney disease with heart failure and stage 1 through stage 4 chronic kidney disease, or unspecified chronic kidney disease: Secondary | ICD-10-CM | POA: Diagnosis not present

## 2022-02-27 ENCOUNTER — Ambulatory Visit: Payer: Medicare HMO

## 2022-03-03 ENCOUNTER — Telehealth: Payer: Self-pay

## 2022-03-03 ENCOUNTER — Other Ambulatory Visit (HOSPITAL_COMMUNITY): Payer: Self-pay

## 2022-03-03 NOTE — Telephone Encounter (Signed)
Lpmtcb and reschedule INR appt from 9/11.

## 2022-03-05 ENCOUNTER — Other Ambulatory Visit (HOSPITAL_COMMUNITY): Payer: Self-pay

## 2022-03-06 ENCOUNTER — Other Ambulatory Visit (HOSPITAL_COMMUNITY): Payer: Self-pay

## 2022-03-09 ENCOUNTER — Ambulatory Visit: Payer: Medicare HMO | Attending: Cardiology

## 2022-03-09 ENCOUNTER — Other Ambulatory Visit: Payer: Self-pay | Admitting: Cardiology

## 2022-03-09 ENCOUNTER — Encounter (HOSPITAL_COMMUNITY): Payer: Medicare HMO | Admitting: Cardiology

## 2022-03-09 DIAGNOSIS — Z5181 Encounter for therapeutic drug level monitoring: Secondary | ICD-10-CM

## 2022-03-09 DIAGNOSIS — I4891 Unspecified atrial fibrillation: Secondary | ICD-10-CM

## 2022-03-09 LAB — POCT INR: INR: 3.1 — AB (ref 2.0–3.0)

## 2022-03-09 NOTE — Telephone Encounter (Signed)
Refill request for warfarin:  Last INR was 4.2 on 02/13/22 Next INR due on 03/09/22 LOV was 11/11/21  Einar Crow MD  Refill approved.

## 2022-03-09 NOTE — Patient Instructions (Signed)
Hold warfarin today and then START taking warfarin 1 tablet daily except for 1/2 a tablet on Mondays. Recheck INR in 3 weeks. [facility] [phone]. Patient has been taking 1 tablet Daily.

## 2022-03-20 ENCOUNTER — Other Ambulatory Visit (HOSPITAL_COMMUNITY): Payer: Self-pay

## 2022-03-25 DIAGNOSIS — H35351 Cystoid macular degeneration, right eye: Secondary | ICD-10-CM | POA: Diagnosis not present

## 2022-03-25 DIAGNOSIS — H35371 Puckering of macula, right eye: Secondary | ICD-10-CM | POA: Diagnosis not present

## 2022-03-25 DIAGNOSIS — H35073 Retinal telangiectasis, bilateral: Secondary | ICD-10-CM | POA: Diagnosis not present

## 2022-03-25 DIAGNOSIS — H35341 Macular cyst, hole, or pseudohole, right eye: Secondary | ICD-10-CM | POA: Diagnosis not present

## 2022-03-25 DIAGNOSIS — Z961 Presence of intraocular lens: Secondary | ICD-10-CM | POA: Diagnosis not present

## 2022-03-26 DIAGNOSIS — I509 Heart failure, unspecified: Secondary | ICD-10-CM | POA: Diagnosis not present

## 2022-03-28 DIAGNOSIS — I130 Hypertensive heart and chronic kidney disease with heart failure and stage 1 through stage 4 chronic kidney disease, or unspecified chronic kidney disease: Secondary | ICD-10-CM | POA: Diagnosis not present

## 2022-03-28 DIAGNOSIS — I482 Chronic atrial fibrillation, unspecified: Secondary | ICD-10-CM | POA: Diagnosis not present

## 2022-03-28 DIAGNOSIS — N1831 Chronic kidney disease, stage 3a: Secondary | ICD-10-CM | POA: Diagnosis not present

## 2022-03-28 DIAGNOSIS — I5032 Chronic diastolic (congestive) heart failure: Secondary | ICD-10-CM | POA: Diagnosis not present

## 2022-03-31 ENCOUNTER — Ambulatory Visit
Admission: RE | Admit: 2022-03-31 | Discharge: 2022-03-31 | Disposition: A | Discharge status: Home / Self Care | Payer: Medicare HMO | Source: Ambulatory Visit | Attending: Cardiology | Admitting: Cardiology

## 2022-03-31 ENCOUNTER — Ambulatory Visit: Payer: Medicare HMO

## 2022-03-31 ENCOUNTER — Encounter (HOSPITAL_COMMUNITY): Payer: Self-pay | Admitting: Cardiology

## 2022-03-31 VITALS — BP 130/80 | HR 66 | Wt 133.4 lb

## 2022-03-31 DIAGNOSIS — E785 Hyperlipidemia, unspecified: Secondary | ICD-10-CM | POA: Insufficient documentation

## 2022-03-31 DIAGNOSIS — I340 Nonrheumatic mitral (valve) insufficiency: Secondary | ICD-10-CM | POA: Insufficient documentation

## 2022-03-31 DIAGNOSIS — I3139 Other pericardial effusion (noninflammatory): Secondary | ICD-10-CM | POA: Insufficient documentation

## 2022-03-31 DIAGNOSIS — Z79899 Other long term (current) drug therapy: Secondary | ICD-10-CM | POA: Insufficient documentation

## 2022-03-31 DIAGNOSIS — Z7901 Long term (current) use of anticoagulants: Secondary | ICD-10-CM | POA: Insufficient documentation

## 2022-03-31 DIAGNOSIS — I5032 Chronic diastolic (congestive) heart failure: Secondary | ICD-10-CM | POA: Insufficient documentation

## 2022-03-31 DIAGNOSIS — I482 Chronic atrial fibrillation, unspecified: Secondary | ICD-10-CM | POA: Diagnosis not present

## 2022-03-31 DIAGNOSIS — I272 Pulmonary hypertension, unspecified: Secondary | ICD-10-CM | POA: Insufficient documentation

## 2022-03-31 DIAGNOSIS — R0609 Other forms of dyspnea: Secondary | ICD-10-CM | POA: Insufficient documentation

## 2022-03-31 DIAGNOSIS — I110 Hypertensive heart disease with heart failure: Secondary | ICD-10-CM | POA: Insufficient documentation

## 2022-03-31 LAB — BASIC METABOLIC PANEL
Anion gap: 13 (ref 5–15)
BUN: 21 mg/dL (ref 8–23)
CO2: 28 mmol/L (ref 22–32)
Calcium: 9.5 mg/dL (ref 8.9–10.3)
Chloride: 99 mmol/L (ref 98–111)
Creatinine, Ser: 1.49 mg/dL — ABNORMAL HIGH (ref 0.44–1.00)
GFR, Estimated: 35 mL/min — ABNORMAL LOW (ref >60)
Glucose, Bld: 98 mg/dL (ref 70–99)
Potassium: 3.1 mmol/L — ABNORMAL LOW (ref 3.5–5.1)
Sodium: 140 mmol/L (ref 135–145)

## 2022-03-31 LAB — BRAIN NATRIURETIC PEPTIDE: B Natriuretic Peptide: 1091.3 pg/mL — ABNORMAL HIGH (ref 0.0–100.0)

## 2022-03-31 LAB — PROTIME-INR
INR: 1.4 — ABNORMAL HIGH (ref 0.8–1.2)
Prothrombin Time: 17.4 seconds — ABNORMAL HIGH (ref 11.4–15.2)

## 2022-03-31 NOTE — Patient Instructions (Signed)
There has been no changes to your medications.  Labs done today, your results will be available in MyChart, we will contact you for abnormal readings.  Your physician recommends that you schedule a follow-up appointment in: 4 months (February 2024)  ** please call the office in December to arrange your follow up appointment **  If you have any questions or concerns before your next appointment please send [place] a message through [place] or call our office at [phone].    TO LEAVE A MESSAGE FOR THE NURSE SELECT OPTION 2, PLEASE LEAVE A MESSAGE INCLUDING: YOUR NAME DATE OF BIRTH CALL BACK NUMBER REASON FOR CALL**this is important as we prioritize the call backs  YOU WILL RECEIVE A CALL BACK THE SAME DAY AS LONG AS YOU CALL BEFORE 4:00 PM  At the [facility], you and your health needs are our priority. As part of our continuing mission to provide you with exceptional heart care, we have created designated Provider Care Teams. These Care Teams include your primary Cardiologist (physician) and Advanced Practice Providers (APPs- Physician Assistants and Nurse Practitioners) who all work together to provide you with the care you need, when you need it.   You may see any of the following providers on your designated Care Team at your next follow up: Dr Glori Bickers Dr [name] Dr. Roxana Hires, NP Lyda Jester, [facility] [place], [place] Forestine Na, NP [name], PharmD   Please be sure to bring in all your medications bottles to every appointment.

## 2022-04-01 NOTE — Progress Notes (Signed)
Date:  04/01/2022   ID:  Vicki [name], DOB September 29, 1940, MRN [id number]   Provider location: [facility] Type of Visit: Established patient   PCP:  Deland Pretty, MD  Cardiologist:  Dr. Aundra Dubin   History of Present Illness: Vicki [name] is a 81 y.o. female who has a history of chronic diastolic CHF, pulmonary hypertension and chronic atrial fibrillation.  Patient was followed by Dr. Glade Lloyd in the past for chronic atrial fibrillation.  She has been on coumadin.  She reports progressive exertional dyspnea since 2011.  This gradually worsened and became quite significant over the last few months.  She used to have significant HTN, but more recently her BP has been on the lower side.  Echo was done in 2/14, showing severe concentric LVH with EF 55-60%, moderately dilated RV, moderate to severe TR, and PA systolic pressure 86 mmHg.  I did a right heart cath in 5/14.  This showed PA pressure 104/36 with PCWP 20, suggesting pulmonary arterial HTN well out of proportion to the mildly elevate wedge pressure.  She was already on amlodipine so I did not do vasodilator testing.  V/Q scan was done, showing no evidence for chronic PEs.  PFTs showed a restrictive defect. Cardiac MRI did not show definite evidence for amyloid.  I started her on macitentan 10 mg daily.  Initially, she felt better on macitentan.  However, she was admitted in 5/14 from her sleep study due to orthopnea and dyspnea.  She was diuresed for several days and diuretic was switched over to torsemide.  I next started her on tadalafil 20 mg daily and titrated up to 40 mg daily.  She thinks that this helped.  She saw pulmonary after chest CT (showed mosaic attenuation in lungs).  This was thought to be due to air-trapping rather than ILD.  She was started on Spiriva. She wears oxygen at home.    She had an echocardiogram in 2/15 that showed severe LVH, EF 75%, small pericardial effusion, mildly dilated RV with mildly  decreased systolic function, PA systolic pressure 84 mmHg.  She is not totally sure if she was taking macitentan and tadalafil at that time.  She has had a hard month.  She ran out of her macitentan and tadalafil and her insurance company refused to refill them.  After this, she took a decided turn for the worse.  [place] her foot in the fall.  She became much more short of breath, just with walking around the house. She developed lightheaded spells, especially with micturation.  Given lightheadedness and presyncope, she was admitted last week.  She was restarted on her medications and she finally got back on her meds at home.  In the hospital, she was noted to be bradycardic with HR to 30s so metoprolol was stopped.  Of note, her abdominal fat pad biopsy did not suggest amyloidosis. Repeat RHC in 3/15 still with moderate to severe PAH and low CI.  Holter off beta blocker in 3/15 showed average HR 73.    Echo in 9/17 showed EF 65-70% with moderate to severe LVH, moderate MR, normal RV size with mildly decreased systolic function, PASP 56 mmHg, small pericardial effusion.    Echo in 10/18 showed EF 60-65% with moderate LVH, moderate MR, severe biatrial enlargement, PASP 62 mmHg, RV normal.  PYP scan in 10/18 was not suggestive of TTR amyloidosis.  Echo in 10/19 showed EF 65-70%, moderate MR, normal RV size and systolic function, PASP 43 mmHg.   Echo in 12/20 showed EF 65-70%, moderate LVH, severe biatrial enlargement, mild-moderate MR, small pericardial effusion, unable to estimate PA systolic pressure.   PYP scan in 12/20 was visually grade 2 with H/CL 1.96 but activity appeared localized to the atria. Inv[employer]ae gene testing for TTR amyloidosis in 3/21 was negative.   Echo in 2/22 showed EF 60-65%, moderate LVH, normal RV, PASP 37 mmHg, moderate central MR.  PYP scan repeated 9/22 was grade 2 with H/CL 1.28 => isolated atrial uptake  again.  Echo in 5/23 showed EF 60-65%, severe concentric LVH, normal RV, PASP 49 mmHg, moderate MR, small pericardial effusion.    She returns for followup of CHF and pulmonary hypertension. On Tyvaso, tadalafil, and macitentan for pulmonary hypertension, able to get all meds without problems.  Weight is down 15 lbs. Poor appetite, she blames this on buspirone.  She denies dyspnea walking on flat ground at a steady pace.  She can get up stairs without problems.  No lightheadedness.  No chest pain.   ECG (personally reviewed): atrial fibrillation, LVH with repolarization abnormality      6 minute walk (5/14): 122 m.   6 minute walk (7/14): 152 m 6 minute walk (10/14): 183 m 6 minute walk (4/15): 317 m 6 minute walk (12/15): 259 m 6 minute walk (4/16): 293 m 6 minute walk (8/16): 341 m 6 minute walk (12/16): 274 m 6 minute walk (6/17): 314 m 6 minute walk (9/17): 307 m 6 minute walk (4/18): 366 m 6 minute walk (10/18): 320 m 6 minute walk (3/19): 381 m 6 minute walk (9/19): 366 m 6 minute walk (9/20): 305 m 6 minute walk (12/20): 243 m 6 minute walk (6/21): 304 m 6 minute walk (11/21): 323 m 6 minute walk (2/22): 305 m 6 minute walk (5/22): 366 m 6 minute walk (1/23): 366 m 6 minute walk (5/23): 314 m   Labs (12/13): HCT 45.1, plts 153, K 3.5, creatinine 1.0 Labs (1/14): BNP 849 Labs (4/14): K 3.1, creatinine 1.0, ANA and anti-SCL-70 antibody negative.  Rheumatoid factor negative.  Serum immunofixation did not show monoclonal light chains.  Labs (5/14): K 3.3, creatinine 0.79, proBNP 5147 Labs (6/14): K 4, creatinine 0.9, BNP 1001 Labs (10/14): LDL 53, LDL-P 975, K 3.7, creatinine 1.2 Labs (11/14): K 3.7, creatinine 0.9, BNP 832 Labs (2/15): K 3.5, creatinine 1.10, HCT 42.6, UPEP negative, SPEP negative.  Labs (3/15): K 3.8, creatinine 0.88 Labs (4/15): K 3.7, creatinine 0.99, proBNP 1653 Labs (7/15): K 4, creatinine 0.93 Labs (12/15): LDL 77, HDL 58, K 3.9, creatinine 1.03,  LFTs normal Labs (4/16): K 3.8, creatinine 0.93, BNP 475, plts 105, HCT 42.3 Labs (11/16): K 3.6, creatinine 1.1, HCT 42.2 Labs (12/16): LDL 54, HDL 50, BNP 628 Labs (6/17): K 3.7, creatinine 0.97, HCT 41.3, BNP 489 Labs (9/17): K 3.8, creatinine 0.97, LDL 65, HDL 54 Labs (9/18): K 4.1, creatinine 1.05, hgb 14.6, BNP 646 Labs (10/18): BNP 588, TSH normal, LDL 73, HDL 48, K 3.5, creatinine 0.97, hgb 12.5, plts 91, LFTs normal Labs (3/19): K 4.1, creatinine 1.07, hgb 13.2 Labs (9/19): K 3.8, creatinine 1.1 Labs (7/20): K 3.9, creatinine 1.3, LDL 63 Labs (9/20): K 4.2, creatinine 1.05, hgb 12.6, BNP 632  Labs (12/20): Myeloma panel negative, urine immunofixation negative.  Labs (1/21): K 4.3, creatinine 1.12, LDL 63 Labs (3/21): K 3.8, creatinine 1.13 Labs (6/21):  K 3.9, creatinine 1.16 Labs (11/21): LDL 63, K 4.1, creatinine 1.19 Labs (2/22): K 4.3, creatinine 1.14, hgb 11.5, BNP 915 Labs (5/22): LDL 65 Labs (6/22): K 4, creatinine 1.16 Labs (8/22): K 3.6, creatinine 1.05, BNP 720 Labs (2/23): K 3.8, creatinine 1.26, LDL 62, BNP 623 Labs (5/23): K 3.5, creatinine 1.18, BNP 477   PMH: 1. Chronic diastolic CHF: Echo (6/81) with EF 55-60%, severe LVH (no SAM, no asymmetric hypertrophy, no LVOT gradient), moderate-severe LAE, moderately dilated RV with mildly decreased systolic function, moderate to severe RAE, PA systolic pressure 86 mmHg, moderate-severe TR, moderate MR, trivial pericardial effusion.  Cardiac MRI (5/14): EF 65%, severe LVH, no definite evidence for amyloidosis (no delayed enhancement, myocardium not difficult to null).  Echo (2/15) with EF 75%, severe LVH, grade II diastolic dysfunction, moderate MR, RV mildly dilated with mildly decreased systolic function, moderate TR, PA systolic pressure 84 mmHg, small pericardial effusion. Abdominal fat pad biopsy (2/15) showed no evidence for amyloidosis.  SPEP/UPEP negative 2/15.  - Echo (4/16) with EF 60-65%, severe LVH, RV mildly  dilated with mildly decreased systolic function, PA systolic pressure 40 mmHg. - Echo (9/17) with EF 65-70%, severe LVH, moderate MR, normal RV size with mildly decreased systolic function, PASP 56 mmHg, small pericardial effusion.  - Echo (10/18): EF 60-65% with moderate LVH, moderate MR, severe biatrial enlargement, PASP 62 mmHg, RV normal. - PYP scan (10/18) was not suggestive of TTR amyloidosis.  - Echo (10/19): EF 65-70%, moderate MR, normal RV size and systolic function, PASP 43 mmHg. - Echo (12/20): EF 65-70%, moderate LVH, severe biatrial enlargement, mild-moderate MR, small pericardial effusion, unable to estimate PA systolic pressure.  - PYP scan (12/20): grade 2 with H/CL 1.96 but activity localized to the atria.  - Invitae gene testing for TTR amyloidosis in 3/21 was negative.  - Echo (2/22): EF 60-65%, moderate LVH, normal RV, PASP 37 mmHg, moderate central MR. - PYP scan (9/22): grade 2, H/CL 1.28 => isolated atrial uptake.  - Echo (5/23): EF 60-65%, severe concentric LVH, normal RV, PASP 49 mmHg, moderate MR, small pericardial effusion.  2. Chronic atrial fibrillation since around 2004.  Developed bradycardia and metoprolol stopped 2/15. Holter (3/15) with average HR 73, atrial fibrillation, 3.8 sec pause x 1 while asleep, PVCs.  3. HTN: For decades.  4. LHC (2/08) with no significant disease.  5. Chronic thrombocytopenia: ITP 6. Pulmonary arterial HTN: RHC (5/14) with mean RA 13, PA 104/36 (mean 63), mean PCWP 20 on right and 23 on left, CI 2.3 (Fick) and 1.6 (thermo), PVR 10.4 WU (Fick) and 15 WU (thermo).  Vasodilator testing not done as patient was already on amlodipine.  V/Q scan (5/14) with no evidence for chronic PE.  ANA, RF, and anti-SCL70 antibody negative.  PFTs (5/14) with FEV1 60%, FVC 54%, ratio 112%, TLC 61%, DLCO 43% => restrictive defect.  Sleep study (7/14) with no OSA.  CT chest with areas of mosaic attenuation in lungs (saw pulmonary, thought air trapping and not  ILD). RHC (3/15) with RA mean 5, PA 66/31 mean 43, PCWP mean 11, Cardiac Index (Fick) 1.97, PVR 9.2 WU.  Patient was started on Tyvaso in 3/15.  Echo (4/16) with mildly dilated and mildly dysfunctional RV, PA systolic pressure 40 mmHg.  7. Chest pain: Cardiolite 11/21 with no ischemia.   Current Outpatient Medications  Medication Sig Dispense Refill   acetaminophen (TYLENOL) 500 MG tablet Take 500 mg by mouth every 6 (six) hours as needed for  mild pain or headache.      amLODipine (NORVASC) 5 MG tablet Take 1 tablet (5 mg total) by mouth daily. 90 tablet 3   busPIRone (BUSPAR) 5 MG tablet Take 5 mg by mouth every morning.     Cholecalciferol (VITAMIN D3) 2000 units TABS Take 1 tablet by mouth daily.      KLOR-CON M20 20 MEQ tablet TAKE TWO TABLETS BY MOUTH IN THE MORNING AND ONE IN THE EVENING 90 tablet 0   levocetirizine (XYZAL) 5 MG tablet Take 2.5 mg by mouth daily in the afternoon.     macitentan (OPSUMIT) 10 MG tablet TAKE 1 TABLET (10 MG TOTAL) BY MOUTH DAILY WITH BREAKFAST. 90 tablet 3   rosuvastatin (CRESTOR) 20 MG tablet Take 20 mg by mouth at bedtime.      tadalafil, PAH, (ADCIRCA) 20 MG tablet TAKE 2 TABLETS DAILY GENERIC FOR ADCIRCA 60 tablet 11   Tafamidis (VYNDAMAX) 61 MG CAPS Take 1 capsule by mouth daily. 90 capsule 3   torsemide (DEMADEX) 20 MG tablet Take 1.5 tablets (30 mg total) by mouth 2 (two) times daily. 90 tablet 6   Treprostinil (TYVASO) 0.6 MG/ML SOLN Inhale 54 mcg into the lungs 4 (four) times daily. 30 mL 5   warfarin (COUMADIN) 5 MG tablet TAKE ONE TABLET BY MOUTH EVERY EVENING EXCEPT 1/2 TABLET ON MONDAYS OR AS DIRECTED 90 tablet 1   No current facility-administered medications for this encounter.    Allergies:   Patient has no known allergies.   Social History:  The patient  reports that she has never smoked. She has never used smokeless tobacco. She reports current alcohol use of about 3.0 standard drinks of alcohol per week. She reports that she does not use  drugs.   Family History:  The patient's family history includes Alzheimer's disease in her mother; Heart Problems in her brother and maternal aunt; Heart attack in her father; Hypertension in her brother; Pulmonary Hypertension in her cousin; Thyroid disease in her daughter, daughter, and maternal aunt.   ROS:  Please see the history of present illness.   All other systems are personally reviewed and negative.   Exam:   BP 130/80   Pulse 66   Wt 60.5 kg (133 lb 6.4 oz)   SpO2 97%   BMI 24.40 kg/m   General: NAD Neck: No JVD, no thyromegaly or thyroid nodule.  Lungs: Clear to auscultation bilaterally with normal respiratory effort. CV: Nondisplaced PMI.  Heart irregular S1/S2, no S3/S4, no murmur.  No peripheral edema.  No carotid bruit.  Normal pedal pulses.  Abdomen: Soft, nontender, no hepatosplenomegaly, no distention.  Skin: Intact without lesions or rashes.  Neurologic: Alert and oriented x 3.  Psych: Normal affect. Extremities: No clubbing or cyanosis.  HEENT: Normal.   Recent Labs: 03/31/2022: B Natriuretic Peptide 1,091.3; BUN 21; Creatinine, Ser 1.49; Potassium 3.1; Sodium 140  Personally reviewed   Wt Readings from Last 3 Encounters:  03/31/22 60.5 kg (133 lb 6.4 oz)  11/11/21 67.2 kg (148 lb 3.2 oz)  08/05/21 69.9 kg (154 lb)    ASSESSMENT AND PLAN:  1. Pulmonary HTN: Patient has severe pulmonary arterial HTN.  RHC in 3/15 showed CI 1.97, PVR 9. 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 Collagen vascular disease workup was negative (negative RF, ANA and negative anti-SCL-70).  V/Q scan was not suggestive of chronic PEs.  PFTs were suggestive of restrictive lung disease but CT chest and evaluation by pulmonary did not suggest interstitial  lung disease.  Sleep study did not show OSA.  Echo in 3/22 showed normal RV size and systolic function, PASP estimation 37 mmHg.  Echo today showed EF 60-65%, severe concentric LVH, normal RV, PASP 49 mmHg, moderate MR, small  pericardial effusion.  She is stable symptomatically, NYHA class II.  - Continue oxygen at night and with exertion as needed (has not needed during the day).  - Continue Tyvaso, macitentan, Adcirca.  She is doing well on this regimen and is no longer having trouble getting her meds. - BNP today.  2. Chronic diastolic CHF: EF preserved on echo with moderate concentric LVH and a small pericardial effusion.  It is possible that the LVH is due to years of HTN.  The cardiac MRI was not definitively suggestive of cardiac amyloidosis.  I was still concerned for amyloidosis given the appearance of the LV myocardium on echoes.  Abdominal fat pad biopsy in 2015 showed no evidence for amyloidosis. She has some symptoms suggestive of mild peripheral neuropathy.  PYP scan in 10/18 was not suggestive of TTR amyloidosis. I assessed her again for cardiac amyloidosis in 2020: myeloma panel and urine immunofixation negative, PYP scan in 12/20 was grade 2 with H/CL 1.97 but activity was localized to the atria.  Inv[facility]ae gene testing for TTR amyloidosis was negative.  PYP scan repeated 9/22 was grade 2 with H/CL 1.28 => isolated atrial uptake again.  Echo in 5/23 was consistent with cardiac amyloidosis, EF 60-65%, severe concentric LVH, normal RV, PASP 49 mmHg, moderate MR, small pericardial effusion. NYHA class II symptoms.  She is not volume overloaded.  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 This may be an earlier manifestation before LV involvement.  This can be associated with atrial fibrillation. She will continue tafamidis.   - Continue torsemide 30 mg bid. BMET today.   3. HTN: BP controlled.  - Continue amlodipine 5 mg daily.   4. Chronic atrial fibrillation: Continue coumadin.  She is off metoprolol due to bradycardia. Holter off metoprolol did not show any high rate episodes.  5. Hyperlipidemia: On Crestor, good lipids in 2/23.   Followup in 4 months with APP.      Signed, [name], MD  04/01/2022  [facility] [address] [facility] [place] [address] [phone] (office) [phone] (fax)

## 2022-04-03 ENCOUNTER — Telehealth (HOSPITAL_COMMUNITY): Payer: Self-pay | Admitting: *Deleted

## 2022-04-03 NOTE — Telephone Encounter (Signed)
[facility] called to report pt has not been using Tyvaso for 1 week due to some confusion with new kits, they need new rx/orders  VO given to start at 3 breaths QID for 1 week, then increase weekly by 3 breaths up to goal dose of 9 breaths QID (pt's previous dose), they will explain to pt and ensure she understands

## 2022-04-06 ENCOUNTER — Telehealth (HOSPITAL_COMMUNITY): Payer: Self-pay | Admitting: Pharmacist

## 2022-04-06 NOTE — Telephone Encounter (Signed)
Received message that patient had come to clinic stating she was confused on the new instructions for Tyvaso.   Per telephone note 04/03/22: [facility] called to report pt has not been using Tyvaso for 1 week due to some confusion with new kits, they need new rx/orders. VO given to start at 3 breaths QID for 1 week, then increase weekly by 3 breaths up to goal dose of 9 breaths QID (pt's previous dose), they will explain to pt and ensure she understands.   It appears she is still having difficulty understanding instructions. I have contacted [facility]. They will draw up verbal orders and fax to our office for signature to allow them to complete in-house visit with patient.   [name], PharmD, BCPS, BCCP, CPP Heart Failure Clinic Pharmacist [phone]

## 2022-04-13 ENCOUNTER — Telehealth (HOSPITAL_COMMUNITY): Payer: Self-pay | Admitting: Pharmacist

## 2022-04-13 NOTE — Telephone Encounter (Signed)
Received message from [place] nurse:    Pt name: Vicki [name]     DOB: Oct 17, 2040     Prescriber : Dr [name]     [facility] date: 04/09/22     Therapy: Re Start Tyvaso      Next visit: 04/16/22     Education provided: Reducation for the Tyvaso TD 300, set up, breathing technique, side effects/management, daily cleaning, titration, missed treatments, supply management. Eating proper meals with medication      Oxygen: Importance of having usable oxygen concentrator, Calling the [place] to have maintenace on concentrator and portable.      RN Assessment of pt: Pt lives alone, oreinted to person and place but confused to medications and recall, questionable dementia? Patient Could not remember instructions on the TD 300 set up or breathing technique.  VS 110/60, HR 80, 99/67 HR 82, no sid eeffcts reported,  patient reported did not know her current weight. No visable signs of edema, patient sleeps till noon or later, no family memembers present for RN visit even though stated would be present.  RN requested patient retrieve pump using from previous day which had no medication cup and had not been turned on  per patient " waiting for TD 300 to alram" to tell her when to take the treatment.  Patient had this confused with cell phone alarm. Next visit is schedule for 10/19.  RN labled all supplies that had not expired, Listed what is needed for each day. Encourged pt to use the tracker with times listed, coresesponding to alarms set in her phone.  Will assess on next visit if patient has been able to do the treatments.  I am concerened that without significant involement of her family patient will not be successful in Tyvaso TD 300 therapy.

## 2022-04-21 ENCOUNTER — Telehealth (HOSPITAL_COMMUNITY): Payer: Self-pay | Admitting: *Deleted

## 2022-04-21 NOTE — Telephone Encounter (Signed)
Message received from Turner Daniels forwarded to [place]   Received message from [place] nurse: I am sending you this email regarding pt Vicki [name] DOB [birth year].  The family, and I have tried to restart pt on her Tyvaso. We have encountered many obstacles.  The family first initiated the restart of Tyvaso at 3 breaths with the pt.  The family was not available for the [facility] RN visit.  So once the family and RN were able to talk, we found that pt is unable to do the Tyvaso by herself.  Pt is placing medication, into water, not the medication cup, taking Tyvaso device down, opening multiple ampules of Tyvaso at 1 time (cutting off tops) pouring into what?  Family and RN feel pt is unable to take this medication on her own anymore.  She can put the device together but only with help from family or RN and with multiple prompts but, cannot complete again in 2 minutes,  Pt does not remember how to take a breath on the machine after multiple teachings.  Pt thought she had done a treatment the other day only to find no medication, and last time device turned on had been 30 hours before.    Son and Daughter were hoping another therapy might be possible for their mother.  Daughter has devised a Higher education careers adviser that has the days of the week displayed with pts pills and times pt is to take her medications.  Seems to be working (somewhat)  I don't think the pt eats well. Could be part of her confusion.  She does take the Ensure when reminded.  Dr Claris Gladden direction is most appreciated.

## 2022-04-21 NOTE — Telephone Encounter (Signed)
Could we get her on Tyvaso DPI to replace the inhaler?

## 2022-04-21 NOTE — Telephone Encounter (Signed)
Sent message to [place] nurse to ask if she thought patient would be able to adminster the Tyvaso DPI form. Her response was: "I dont think it would be. Pt is having difficulty putting anything together. Putting the DPI together she will have the same memory issue and confusion if she did the treatment or not, plus the family cannot guarantee pt will do her treatment on days they cannot be with her.    Her son and his sister thought since they cannot be there most of the time to monitor her I think they were hoping for something oral to make it easier for them to measure, and maintain for her when they cannot be there with her."

## 2022-04-25 DIAGNOSIS — I509 Heart failure, unspecified: Secondary | ICD-10-CM | POA: Diagnosis not present

## 2022-04-28 ENCOUNTER — Other Ambulatory Visit (HOSPITAL_COMMUNITY): Payer: Self-pay

## 2022-04-28 NOTE — Telephone Encounter (Signed)
Spoke with [facility] nurse regarding proceeding with changing from Tyvaso nebs to DPI. She messaged back: Hold off on doing the paperwork for the DPI form.  The family has decided to have Mrs Street move in with the daughter and Mickel Baas will manage her Tyvaso.  Mrs. Musto re started the Tyvaso last week and per Mickel Baas pt is averaging 3-4 treatments a day.  So they want to stay with Tyvaso.  They still plan to talk at next Apt with Dr Aundra Dubin.  Thank you for getting back to me.  I hope the family is successful in keeping Mrs Maroney with them and on therapy.  Routing to Dr. Aundra Dubin so he is aware. Will continue Tyvaso nebs for now. The family will let us know if they need to switch in the future.   [name], PharmD, BCPS, BCCP, CPP Heart Failure Clinic Pharmacist [phone]

## 2022-04-29 ENCOUNTER — Telehealth (HOSPITAL_COMMUNITY): Payer: Self-pay | Admitting: *Deleted

## 2022-04-29 NOTE — Telephone Encounter (Signed)
Pts daughter called stating pts brother passed away and the patient wants to travel to [place] for his funeral. They would travel by car for 12-15 hours and take breaks for her to stretch her legs but wanted advice from [place].   Routed to [place]

## 2022-04-29 NOTE — Telephone Encounter (Signed)
Ok to go but needs to take breaks every 2 hrs

## 2022-04-29 NOTE — Telephone Encounter (Signed)
Pts daughter Lattie Haw aware.

## 2022-05-01 ENCOUNTER — Other Ambulatory Visit (HOSPITAL_COMMUNITY): Payer: Self-pay

## 2022-05-05 ENCOUNTER — Other Ambulatory Visit (HOSPITAL_COMMUNITY): Payer: Self-pay

## 2022-05-14 ENCOUNTER — Inpatient Hospital Stay (HOSPITAL_COMMUNITY)
Admission: EM | Admit: 2022-05-14 | Discharge: 2022-05-19 | DRG: 871 | Disposition: A | Discharge status: Home / Self Care | Payer: Medicare HMO | Attending: Internal Medicine | Admitting: Internal Medicine

## 2022-05-14 DIAGNOSIS — M549 Dorsalgia, unspecified: Secondary | ICD-10-CM | POA: Diagnosis present

## 2022-05-14 DIAGNOSIS — I4821 Permanent atrial fibrillation: Secondary | ICD-10-CM | POA: Diagnosis present

## 2022-05-14 DIAGNOSIS — Z7901 Long term (current) use of anticoagulants: Secondary | ICD-10-CM

## 2022-05-14 DIAGNOSIS — J111 Influenza due to unidentified influenza virus with other respiratory manifestations: Secondary | ICD-10-CM

## 2022-05-14 DIAGNOSIS — R7989 Other specified abnormal findings of blood chemistry: Secondary | ICD-10-CM | POA: Insufficient documentation

## 2022-05-14 DIAGNOSIS — R059 Cough, unspecified: Secondary | ICD-10-CM | POA: Diagnosis not present

## 2022-05-14 DIAGNOSIS — E854 Organ-limited amyloidosis: Secondary | ICD-10-CM | POA: Insufficient documentation

## 2022-05-14 DIAGNOSIS — J189 Pneumonia, unspecified organism: Secondary | ICD-10-CM | POA: Diagnosis not present

## 2022-05-14 DIAGNOSIS — K458 Other specified abdominal hernia without obstruction or gangrene: Secondary | ICD-10-CM | POA: Diagnosis not present

## 2022-05-14 DIAGNOSIS — R531 Weakness: Secondary | ICD-10-CM | POA: Diagnosis not present

## 2022-05-14 DIAGNOSIS — Z79899 Other long term (current) drug therapy: Secondary | ICD-10-CM

## 2022-05-14 DIAGNOSIS — E860 Dehydration: Secondary | ICD-10-CM | POA: Diagnosis present

## 2022-05-14 DIAGNOSIS — I4891 Unspecified atrial fibrillation: Secondary | ICD-10-CM | POA: Diagnosis not present

## 2022-05-14 DIAGNOSIS — N2889 Other specified disorders of kidney and ureter: Secondary | ICD-10-CM | POA: Diagnosis not present

## 2022-05-14 DIAGNOSIS — J9611 Chronic respiratory failure with hypoxia: Secondary | ICD-10-CM

## 2022-05-14 DIAGNOSIS — J90 Pleural effusion, not elsewhere classified: Secondary | ICD-10-CM | POA: Diagnosis not present

## 2022-05-14 DIAGNOSIS — I272 Pulmonary hypertension, unspecified: Secondary | ICD-10-CM | POA: Diagnosis present

## 2022-05-14 DIAGNOSIS — I2721 Secondary pulmonary arterial hypertension: Secondary | ICD-10-CM | POA: Diagnosis present

## 2022-05-14 DIAGNOSIS — Z90710 Acquired absence of both cervix and uterus: Secondary | ICD-10-CM

## 2022-05-14 DIAGNOSIS — I340 Nonrheumatic mitral (valve) insufficiency: Secondary | ICD-10-CM | POA: Diagnosis present

## 2022-05-14 DIAGNOSIS — I5032 Chronic diastolic (congestive) heart failure: Secondary | ICD-10-CM | POA: Diagnosis present

## 2022-05-14 DIAGNOSIS — J1008 Influenza due to other identified influenza virus with other specified pneumonia: Secondary | ICD-10-CM | POA: Diagnosis present

## 2022-05-14 DIAGNOSIS — N3000 Acute cystitis without hematuria: Principal | ICD-10-CM

## 2022-05-14 DIAGNOSIS — A4189 Other specified sepsis: Secondary | ICD-10-CM | POA: Diagnosis not present

## 2022-05-14 DIAGNOSIS — E876 Hypokalemia: Secondary | ICD-10-CM | POA: Diagnosis not present

## 2022-05-14 DIAGNOSIS — I493 Ventricular premature depolarization: Secondary | ICD-10-CM | POA: Diagnosis present

## 2022-05-14 DIAGNOSIS — K5900 Constipation, unspecified: Secondary | ICD-10-CM | POA: Diagnosis present

## 2022-05-14 DIAGNOSIS — I43 Cardiomyopathy in diseases classified elsewhere: Secondary | ICD-10-CM | POA: Diagnosis present

## 2022-05-14 DIAGNOSIS — Z1152 Encounter for screening for COVID-19: Secondary | ICD-10-CM

## 2022-05-14 DIAGNOSIS — E785 Hyperlipidemia, unspecified: Secondary | ICD-10-CM | POA: Diagnosis present

## 2022-05-14 DIAGNOSIS — I959 Hypotension, unspecified: Secondary | ICD-10-CM | POA: Diagnosis present

## 2022-05-14 DIAGNOSIS — K8020 Calculus of gallbladder without cholecystitis without obstruction: Secondary | ICD-10-CM | POA: Diagnosis not present

## 2022-05-14 DIAGNOSIS — Z8249 Family history of ischemic heart disease and other diseases of the circulatory system: Secondary | ICD-10-CM

## 2022-05-14 DIAGNOSIS — J09X1 Influenza due to identified novel influenza A virus with pneumonia: Secondary | ICD-10-CM | POA: Diagnosis present

## 2022-05-14 DIAGNOSIS — J159 Unspecified bacterial pneumonia: Secondary | ICD-10-CM | POA: Diagnosis present

## 2022-05-14 DIAGNOSIS — N179 Acute kidney failure, unspecified: Secondary | ICD-10-CM | POA: Diagnosis not present

## 2022-05-14 DIAGNOSIS — I3139 Other pericardial effusion (noninflammatory): Secondary | ICD-10-CM | POA: Diagnosis present

## 2022-05-14 DIAGNOSIS — N281 Cyst of kidney, acquired: Secondary | ICD-10-CM | POA: Diagnosis not present

## 2022-05-14 DIAGNOSIS — R413 Other amnesia: Principal | ICD-10-CM

## 2022-05-14 DIAGNOSIS — I110 Hypertensive heart disease with heart failure: Secondary | ICD-10-CM | POA: Diagnosis present

## 2022-05-15 ENCOUNTER — Other Ambulatory Visit: Payer: Self-pay

## 2022-05-15 ENCOUNTER — Emergency Department (HOSPITAL_COMMUNITY): Payer: Medicare HMO

## 2022-05-15 ENCOUNTER — Encounter (HOSPITAL_COMMUNITY): Payer: Self-pay

## 2022-05-15 DIAGNOSIS — E785 Hyperlipidemia, unspecified: Secondary | ICD-10-CM | POA: Diagnosis not present

## 2022-05-15 DIAGNOSIS — Z1152 Encounter for screening for COVID-19: Secondary | ICD-10-CM | POA: Diagnosis not present

## 2022-05-15 DIAGNOSIS — N179 Acute kidney failure, unspecified: Secondary | ICD-10-CM | POA: Diagnosis not present

## 2022-05-15 DIAGNOSIS — J09X1 Influenza due to identified novel influenza A virus with pneumonia: Secondary | ICD-10-CM | POA: Diagnosis present

## 2022-05-15 DIAGNOSIS — N2889 Other specified disorders of kidney and ureter: Secondary | ICD-10-CM | POA: Diagnosis not present

## 2022-05-15 DIAGNOSIS — I4821 Permanent atrial fibrillation: Secondary | ICD-10-CM | POA: Diagnosis not present

## 2022-05-15 DIAGNOSIS — I493 Ventricular premature depolarization: Secondary | ICD-10-CM | POA: Diagnosis not present

## 2022-05-15 DIAGNOSIS — E860 Dehydration: Secondary | ICD-10-CM | POA: Diagnosis not present

## 2022-05-15 DIAGNOSIS — R7989 Other specified abnormal findings of blood chemistry: Secondary | ICD-10-CM | POA: Insufficient documentation

## 2022-05-15 DIAGNOSIS — N281 Cyst of kidney, acquired: Secondary | ICD-10-CM | POA: Diagnosis not present

## 2022-05-15 DIAGNOSIS — K8020 Calculus of gallbladder without cholecystitis without obstruction: Secondary | ICD-10-CM | POA: Diagnosis not present

## 2022-05-15 DIAGNOSIS — I340 Nonrheumatic mitral (valve) insufficiency: Secondary | ICD-10-CM | POA: Diagnosis not present

## 2022-05-15 DIAGNOSIS — R059 Cough, unspecified: Secondary | ICD-10-CM | POA: Diagnosis not present

## 2022-05-15 DIAGNOSIS — I959 Hypotension, unspecified: Secondary | ICD-10-CM | POA: Diagnosis not present

## 2022-05-15 DIAGNOSIS — A4189 Other specified sepsis: Secondary | ICD-10-CM | POA: Diagnosis not present

## 2022-05-15 DIAGNOSIS — J90 Pleural effusion, not elsewhere classified: Secondary | ICD-10-CM | POA: Diagnosis not present

## 2022-05-15 DIAGNOSIS — R531 Weakness: Secondary | ICD-10-CM | POA: Diagnosis not present

## 2022-05-15 DIAGNOSIS — M549 Dorsalgia, unspecified: Secondary | ICD-10-CM | POA: Diagnosis not present

## 2022-05-15 DIAGNOSIS — Z79899 Other long term (current) drug therapy: Secondary | ICD-10-CM | POA: Diagnosis not present

## 2022-05-15 DIAGNOSIS — I2721 Secondary pulmonary arterial hypertension: Secondary | ICD-10-CM | POA: Diagnosis not present

## 2022-05-15 DIAGNOSIS — K458 Other specified abdominal hernia without obstruction or gangrene: Secondary | ICD-10-CM | POA: Diagnosis not present

## 2022-05-15 DIAGNOSIS — Z7901 Long term (current) use of anticoagulants: Secondary | ICD-10-CM | POA: Diagnosis not present

## 2022-05-15 DIAGNOSIS — I43 Cardiomyopathy in diseases classified elsewhere: Secondary | ICD-10-CM | POA: Insufficient documentation

## 2022-05-15 DIAGNOSIS — J159 Unspecified bacterial pneumonia: Secondary | ICD-10-CM | POA: Diagnosis not present

## 2022-05-15 DIAGNOSIS — E876 Hypokalemia: Secondary | ICD-10-CM | POA: Diagnosis not present

## 2022-05-15 DIAGNOSIS — J1008 Influenza due to other identified influenza virus with other specified pneumonia: Secondary | ICD-10-CM | POA: Diagnosis not present

## 2022-05-15 DIAGNOSIS — I4891 Unspecified atrial fibrillation: Secondary | ICD-10-CM | POA: Diagnosis not present

## 2022-05-15 DIAGNOSIS — I5032 Chronic diastolic (congestive) heart failure: Secondary | ICD-10-CM | POA: Diagnosis not present

## 2022-05-15 DIAGNOSIS — R4182 Altered mental status, unspecified: Secondary | ICD-10-CM | POA: Diagnosis not present

## 2022-05-15 DIAGNOSIS — J9611 Chronic respiratory failure with hypoxia: Secondary | ICD-10-CM

## 2022-05-15 DIAGNOSIS — K5900 Constipation, unspecified: Secondary | ICD-10-CM | POA: Diagnosis not present

## 2022-05-15 DIAGNOSIS — I3139 Other pericardial effusion (noninflammatory): Secondary | ICD-10-CM | POA: Diagnosis not present

## 2022-05-15 DIAGNOSIS — E854 Organ-limited amyloidosis: Secondary | ICD-10-CM | POA: Diagnosis not present

## 2022-05-15 DIAGNOSIS — I110 Hypertensive heart disease with heart failure: Secondary | ICD-10-CM | POA: Diagnosis not present

## 2022-05-15 LAB — CBC WITH DIFFERENTIAL/PLATELET
Abs Immature Granulocytes: 0.04 10*3/uL (ref 0.00–0.07)
Basophils Absolute: 0 10*3/uL (ref 0.0–0.1)
Basophils Relative: 1 %
Eosinophils Absolute: 0 10*3/uL (ref 0.0–0.5)
Eosinophils Relative: 0 %
HCT: 31 % — ABNORMAL LOW (ref 36.0–46.0)
Hemoglobin: 9.6 g/dL — ABNORMAL LOW (ref 12.0–15.0)
Immature Granulocytes: 1 %
Lymphocytes Relative: 13 %
Lymphs Abs: 1 10*3/uL (ref 0.7–4.0)
MCH: 30.3 pg (ref 26.0–34.0)
MCHC: 31 g/dL (ref 30.0–36.0)
MCV: 97.8 fL (ref 80.0–100.0)
Monocytes Absolute: 1.4 10*3/uL — ABNORMAL HIGH (ref 0.1–1.0)
Monocytes Relative: 17 %
Neutro Abs: 5.6 10*3/uL (ref 1.7–7.7)
Neutrophils Relative %: 68 %
Platelets: 211 10*3/uL (ref 150–400)
RBC: 3.17 MIL/uL — ABNORMAL LOW (ref 3.87–5.11)
RDW: 15.4 % (ref 11.5–15.5)
WBC: 8.1 10*3/uL (ref 4.0–10.5)
nRBC: 0 % (ref 0.0–0.2)

## 2022-05-15 LAB — COMPREHENSIVE METABOLIC PANEL WITH GFR
ALT: 11 U/L (ref 0–44)
AST: 29 U/L (ref 15–41)
Albumin: 2.9 g/dL — ABNORMAL LOW (ref 3.5–5.0)
Alkaline Phosphatase: 34 U/L — ABNORMAL LOW (ref 38–126)
Anion gap: 11 (ref 5–15)
BUN: 27 mg/dL — ABNORMAL HIGH (ref 8–23)
CO2: 24 mmol/L (ref 22–32)
Calcium: 9.2 mg/dL (ref 8.9–10.3)
Chloride: 103 mmol/L (ref 98–111)
Creatinine, Ser: 2.62 mg/dL — ABNORMAL HIGH (ref 0.44–1.00)
GFR, Estimated: 18 mL/min — ABNORMAL LOW
Glucose, Bld: 104 mg/dL — ABNORMAL HIGH (ref 70–99)
Potassium: 4.1 mmol/L (ref 3.5–5.1)
Sodium: 138 mmol/L (ref 135–145)
Total Bilirubin: 0.6 mg/dL (ref 0.3–1.2)
Total Protein: 7.2 g/dL (ref 6.5–8.1)

## 2022-05-15 LAB — CBC
HCT: 31.3 % — ABNORMAL LOW (ref 36.0–46.0)
Hemoglobin: 9.5 g/dL — ABNORMAL LOW (ref 12.0–15.0)
MCH: 30.3 pg (ref 26.0–34.0)
MCHC: 30.4 g/dL (ref 30.0–36.0)
MCV: 99.7 fL (ref 80.0–100.0)
Platelets: 204 K/uL (ref 150–400)
RBC: 3.14 MIL/uL — ABNORMAL LOW (ref 3.87–5.11)
RDW: 15.4 % (ref 11.5–15.5)
WBC: 6.1 K/uL (ref 4.0–10.5)
nRBC: 0 % (ref 0.0–0.2)

## 2022-05-15 LAB — URINALYSIS, ROUTINE W REFLEX MICROSCOPIC
Bilirubin Urine: NEGATIVE
Glucose, UA: NEGATIVE mg/dL
Ketones, ur: NEGATIVE mg/dL
Nitrite: NEGATIVE
Protein, ur: 100 mg/dL — AB
Specific Gravity, Urine: 1.009 (ref 1.005–1.030)
pH: 5 (ref 5.0–8.0)

## 2022-05-15 LAB — HEPATIC FUNCTION PANEL
ALT: 8 U/L (ref 0–44)
AST: 25 U/L (ref 15–41)
Albumin: 2.2 g/dL — ABNORMAL LOW (ref 3.5–5.0)
Alkaline Phosphatase: 28 U/L — ABNORMAL LOW (ref 38–126)
Bilirubin, Direct: 0.2 mg/dL (ref 0.0–0.2)
Indirect Bilirubin: 0.3 mg/dL (ref 0.3–0.9)
Total Bilirubin: 0.5 mg/dL (ref 0.3–1.2)
Total Protein: 5.6 g/dL — ABNORMAL LOW (ref 6.5–8.1)

## 2022-05-15 LAB — BASIC METABOLIC PANEL WITH GFR
Anion gap: 8 (ref 5–15)
BUN: 25 mg/dL — ABNORMAL HIGH (ref 8–23)
CO2: 24 mmol/L (ref 22–32)
Calcium: 8.4 mg/dL — ABNORMAL LOW (ref 8.9–10.3)
Chloride: 107 mmol/L (ref 98–111)
Creatinine, Ser: 2.32 mg/dL — ABNORMAL HIGH (ref 0.44–1.00)
GFR, Estimated: 21 mL/min — ABNORMAL LOW
Glucose, Bld: 95 mg/dL (ref 70–99)
Potassium: 3.6 mmol/L (ref 3.5–5.1)
Sodium: 139 mmol/L (ref 135–145)

## 2022-05-15 LAB — BRAIN NATRIURETIC PEPTIDE: B Natriuretic Peptide: 767.4 pg/mL — ABNORMAL HIGH (ref 0.0–100.0)

## 2022-05-15 LAB — RESP PANEL BY RT-PCR (FLU A&B, COVID) ARPGX2
Influenza A by PCR: POSITIVE — AB
Influenza B by PCR: NEGATIVE
SARS Coronavirus 2 by RT PCR: NEGATIVE

## 2022-05-15 LAB — PROTIME-INR
INR: 3.1 — ABNORMAL HIGH (ref 0.8–1.2)
Prothrombin Time: 31.7 seconds — ABNORMAL HIGH (ref 11.4–15.2)

## 2022-05-15 LAB — TROPONIN I (HIGH SENSITIVITY)
Troponin I (High Sensitivity): 83 ng/L — ABNORMAL HIGH
Troponin I (High Sensitivity): 98 ng/L — ABNORMAL HIGH (ref <18)

## 2022-05-15 LAB — LACTIC ACID, PLASMA: Lactic Acid, Venous: 1.2 mmol/L (ref 0.5–1.9)

## 2022-05-15 LAB — PROCALCITONIN: Procalcitonin: 0.82 ng/mL

## 2022-05-15 LAB — CBG MONITORING, ED: Glucose-Capillary: 89 mg/dL (ref 70–99)

## 2022-05-15 MED ORDER — SODIUM CHLORIDE 0.9 % IV SOLN
1.0000 g | INTRAVENOUS | Status: DC
  Administered 2022-05-16 – 2022-05-18 (×4): 1 g via INTRAVENOUS
  Filled 2022-05-15 (×4): qty 10

## 2022-05-15 MED ORDER — LACTATED RINGERS IV BOLUS (SEPSIS)
1000.0000 mL | Freq: Once | INTRAVENOUS | Status: DC
  Administered 2022-05-15: 1000 mL via INTRAVENOUS

## 2022-05-15 MED ORDER — OSELTAMIVIR PHOSPHATE 30 MG PO CAPS
30.0000 mg | ORAL_CAPSULE | Freq: Every day | ORAL | Status: AC
  Administered 2022-05-16 – 2022-05-19 (×4): 30 mg via ORAL
  Filled 2022-05-15 (×4): qty 1

## 2022-05-15 MED ORDER — VITAMIN D 25 MCG (1000 UNIT) PO TABS
1000.0000 [IU] | ORAL_TABLET | Freq: Every day | ORAL | Status: DC
  Administered 2022-05-15 – 2022-05-19 (×5): 1000 [IU] via ORAL
  Filled 2022-05-15 (×5): qty 1

## 2022-05-15 MED ORDER — BUSPIRONE HCL 5 MG PO TABS
5.0000 mg | ORAL_TABLET | Freq: Every morning | ORAL | Status: DC
  Administered 2022-05-15 – 2022-05-19 (×5): 5 mg via ORAL
  Filled 2022-05-15 (×5): qty 1

## 2022-05-15 MED ORDER — ROSUVASTATIN CALCIUM 20 MG PO TABS
20.0000 mg | ORAL_TABLET | Freq: Every day | ORAL | Status: DC
  Administered 2022-05-15 – 2022-05-18 (×4): 20 mg via ORAL
  Filled 2022-05-15 (×4): qty 1

## 2022-05-15 MED ORDER — OSELTAMIVIR PHOSPHATE 30 MG PO CAPS
30.0000 mg | ORAL_CAPSULE | Freq: Once | ORAL | Status: AC
  Administered 2022-05-15: 30 mg via ORAL
  Filled 2022-05-15: qty 1

## 2022-05-15 MED ORDER — LACTATED RINGERS IV BOLUS
1000.0000 mL | Freq: Once | INTRAVENOUS | Status: AC
  Administered 2022-05-15: 1000 mL via INTRAVENOUS

## 2022-05-15 MED ORDER — LACTATED RINGERS IV BOLUS (SEPSIS)
1000.0000 mL | Freq: Once | INTRAVENOUS | Status: AC
  Administered 2022-05-15: 1000 mL via INTRAVENOUS

## 2022-05-15 MED ORDER — MACITENTAN 10 MG PO TABS: 10.0000 mg | ORAL_TABLET | Freq: Every day | ORAL | Status: DC

## 2022-05-15 MED ORDER — SODIUM CHLORIDE 0.9 % IV SOLN
500.0000 mg | Freq: Once | INTRAVENOUS | Status: AC
  Administered 2022-05-15: 500 mg via INTRAVENOUS
  Filled 2022-05-15: qty 5

## 2022-05-15 MED ORDER — SODIUM CHLORIDE 0.9 % IV SOLN
1.0000 g | Freq: Once | INTRAVENOUS | Status: AC
  Administered 2022-05-15: 1 g via INTRAVENOUS
  Filled 2022-05-15: qty 10

## 2022-05-15 MED ORDER — MACITENTAN 10 MG PO TABS
10.0000 mg | ORAL_TABLET | Freq: Every day | ORAL | Status: DC
  Administered 2022-05-15 – 2022-05-19 (×5): 10 mg via ORAL
  Filled 2022-05-15 (×5): qty 1

## 2022-05-15 MED ORDER — DOCUSATE SODIUM 100 MG PO CAPS
100.0000 mg | ORAL_CAPSULE | Freq: Two times a day (BID) | ORAL | Status: DC
  Administered 2022-05-15 – 2022-05-19 (×7): 100 mg via ORAL
  Filled 2022-05-15 (×8): qty 1

## 2022-05-15 MED ORDER — ACETAMINOPHEN 325 MG PO TABS
650.0000 mg | ORAL_TABLET | Freq: Once | ORAL | Status: AC
  Administered 2022-05-15: 650 mg via ORAL
  Filled 2022-05-15: qty 2

## 2022-05-15 MED ORDER — DEXTROSE-NACL 5-0.9 % IV SOLN: INTRAVENOUS | Status: DC

## 2022-05-15 MED ORDER — SODIUM CHLORIDE 0.9 % IV BOLUS (SEPSIS)
500.0000 mL | Freq: Once | INTRAVENOUS | Status: AC
  Administered 2022-05-15: 500 mL via INTRAVENOUS

## 2022-05-15 MED ORDER — TADALAFIL (PAH) 20 MG PO TABS: 20.0000 mg | ORAL_TABLET | Freq: Every day | ORAL | Status: DC

## 2022-05-15 MED ORDER — TAFAMIDIS 61 MG PO CAPS
1.0000 | ORAL_CAPSULE | Freq: Every day | ORAL | Status: DC
  Administered 2022-05-18: 1 via ORAL
  Filled 2022-05-15 (×2): qty 1

## 2022-05-15 MED ORDER — SODIUM CHLORIDE 0.9 % IV SOLN
500.0000 mg | INTRAVENOUS | Status: AC
  Administered 2022-05-15 – 2022-05-18 (×4): 500 mg via INTRAVENOUS
  Filled 2022-05-15 (×4): qty 5

## 2022-05-15 MED ORDER — TADALAFIL 20 MG PO TABS
20.0000 mg | ORAL_TABLET | Freq: Every day | ORAL | Status: DC
  Administered 2022-05-15 – 2022-05-18 (×4): 20 mg via ORAL
  Filled 2022-05-15 (×4): qty 1

## 2022-05-15 MED ORDER — TREPROSTINIL 0.6 MG/ML IN SOLN
54.0000 ug | Freq: Four times a day (QID) | RESPIRATORY_TRACT | Status: DC
  Administered 2022-05-19 (×2): 54 ug via RESPIRATORY_TRACT
  Filled 2022-05-15: qty 2.9

## 2022-05-15 NOTE — Progress Notes (Signed)
[place] for warfarin Indication: atrial fibrillation   Assessment: 28 yof with hx of afib on warfarin PTA presenting with weakness and AMS, initiated on ceftriaxone/azithromycin for CAP/UTI. Pharmacy consulted to dose warfarin inpatient. INR slightly supratherapeutic on admission at 3.1. Hg 9.6, plt WNL. No bleed issues reported. Noted DDI with azithromycin - may cause INR to increase. Patient also with AKI.  PTA warfarin dose: 21m daily except 2.560mon Mondays (last dose 05/14/22 PTA)  Goal of Therapy:  INR 2-3 Monitor platelets by anticoagulation protocol: Yes   Plan:  Hold warfarin tonight - INR slightly supratherapeutic and on interacting med likely to cause INR to further increase Daily INR Monitor CBC, s/sx bleeding, DDI   HaArturo MortonPharmD, BCPS Please check AMION for all [facility] numbers Clinical Pharmacist 05/15/2022 7:58 AM

## 2022-05-15 NOTE — Progress Notes (Addendum)
ANTICOAGULATION CONSULT NOTE - Initial Consult  Pharmacy Consult for warfarin>> heparin Indication: atrial fibrillation  No Known Allergies  Patient Measurements: Height: 5' 2" (157.5 cm) Weight: 60.3 kg (133 lb) IBW/kg (Calculated) : 50.1 Heparin Dosing Weight: 60.3 kg   Vital Signs: Temp: 97 F (36.1 C) (11/17 0849) Temp Source: Axillary (11/17 0849) BP: 119/82 (11/17 1130) Pulse Rate: 80 (11/17 1130)  Labs: Recent Labs    05/15/22 0023 05/15/22 0137 05/15/22 0231 05/15/22 0855  HGB 9.6*  --   --   --   HCT 31.0*  --   --   --   PLT 211  --   --   --   LABPROT  --  31.7*  --   --   INR  --  3.1*  --   --   CREATININE 2.62*  --   --  2.32*  TROPONINIHS 83*  --  98*  --     Estimated Creatinine Clearance: 16.3 mL/min (A) (by C-G formula based on SCr of 2.32 mg/dL (H)).   Medical History: Past Medical History:  Diagnosis Date   Arthritis    elbow   Atrial fibrillation (HCC)    Cancer (HCC)    Mylenoma- top of head   CHF (congestive heart failure) (HCC)    Dysrhythmia    Heart murmur    On home oxygen therapy    4 bliters   Pulmonary hypertension (HCC)    Seasonal allergies    Thrombocytopenia (HCC) 7/16    Medications:  Scheduled:   busPIRone  5 mg Oral q morning   cholecalciferol  1,000 Units Oral Daily   docusate sodium  100 mg Oral BID   macitentan  10 mg Oral Daily   [START ON 05/16/2022] oseltamivir  30 mg Oral Daily   rosuvastatin  20 mg Oral QHS   tadalafil (PAH)  20 mg Oral Daily   Tafamidis  1 capsule Oral Daily   Treprostinil  54 mcg Inhalation QID    Assessment: 46 yof with hx of afib on warfarin PTA presenting with weakness and AMS, initiated on ceftriaxone/azithromycin for CAP/UTI.   INR slightly supratherapeutic on admission at 3.1. Hg 9.6, plt WNL. Consult planned to change from warfarin to heparin infusion given possible bleeding in renal mass and would like shorter acting anticoagulant.    PTA warfarin dose: 70m daily except  2.571mon Mondays (last dose 05/14/22 PTA)  Goal of Therapy:  INR 2-3 Heparin level 0.3-0.7 units/ml once started  Monitor platelets by anticoagulation protocol: Yes   Plan:  Hold warfarin Will start heparin infusion when INR<2 Will recheck INR with daily labs on 11/18 Monitor daily INR, CBC, and for s/sx of bleeding   KiAntonietta JewelPharmD, BCCCP Clinical Pharmacist  Phone: [phone] 50471/17/2023 11:54 AM  Please check AMION for all [facility] numbers After 10:00 PM, call [phone]

## 2022-05-15 NOTE — ED Notes (Signed)
Pt received a total of 500 mL of NS and 2L of LR.

## 2022-05-15 NOTE — ED Triage Notes (Signed)
Pt brought in by family with c/o cough since yesterday and as today progressed today she has become more weak, confused and "lethargic" per family report. She has had trouble walking around which is unusual for her.  She reports back pain as well. She is alert and oriented to name & place, had trouble remembering her birthday. Hx of pulmonary HTN.

## 2022-05-15 NOTE — Sepsis Progress Note (Signed)
Following per sepsis protocol  ? ?

## 2022-05-15 NOTE — ED Provider Notes (Signed)
[facility] EMERGENCY DEPARTMENT Provider Note   CSN: [id number] Arrival date & time: 05/14/22  2342     History  Chief Complaint  Patient presents with   Weakness   Altered Mental Status    Vicki [name] is a 81 y.o. female.  The history is provided by the patient and a relative.  Weakness Associated symptoms: cough   Associated symptoms: no diarrhea and no vomiting   Altered Mental Status Associated symptoms: weakness   Associated symptoms: no vomiting   Patient with extensive history including atrial fibrillation on Coumadin, CHF, pulmonary hypertension, on home oxygen Presents with cough, increasing weakness and confusion. Most of the history is provided by family.  They report over the past day patient's had increasing cough and reported back pain.  She is typically ambulatory.  However over the past 12 hours she has had increasing generalized weakness.  She is essentially unable to stand up on her own at this time.  No recent falls or trauma reported.  No vomiting or diarrhea.  Patient was post to be in bed, but then family found her sitting up and she never made it in the bed.   Past Medical History:  Diagnosis Date   Arthritis    elbow   Atrial fibrillation ([place])    Cancer ([place])    Mylenoma- top of head   CHF (congestive heart failure) (HCC)    Dysrhythmia    Heart murmur    On home oxygen therapy    4 bliters   Pulmonary hypertension (HCC)    Seasonal allergies    Thrombocytopenia ([place]) 7/16    Home Medications Prior to Admission medications   Medication Sig Start Date End Date Taking? Authorizing Provider  acetaminophen (TYLENOL) 500 MG tablet Take 500 mg by mouth every 6 (six) hours as needed for mild pain or headache.     [provider]  amLODipine (NORVASC) 5 MG tablet Take 1 tablet (5 mg total) by mouth daily. 11/11/21   Larey Dresser, MD  busPIRone (BUSPAR) 5 MG tablet Take 5 mg by mouth every morning. 12/19/21   [provider]  Cholecalciferol (VITAMIN D3) 2000 units TABS Take 1 tablet by mouth daily.     [provider]  KLOR-CON M20 20 MEQ tablet TAKE TWO TABLETS BY MOUTH IN THE MORNING AND ONE IN THE EVENING 11/18/15   Larey Dresser, MD  levocetirizine (XYZAL) 5 MG tablet Take 2.5 mg by mouth daily in the afternoon.    [provider]  macitentan (OPSUMIT) 10 MG tablet TAKE 1 TABLET (10 MG TOTAL) BY MOUTH DAILY WITH BREAKFAST. 09/08/21   Larey Dresser, MD  rosuvastatin (CRESTOR) 20 MG tablet Take 20 mg by mouth at bedtime.     [provider]  tadalafil, PAH, (ADCIRCA) 20 MG tablet TAKE 2 TABLETS DAILY GENERIC FOR ADCIRCA 05/01/21   Larey Dresser, MD  Tafamidis [facility]) 61 MG CAPS Take 1 capsule by mouth daily. 08/27/21   Larey Dresser, MD  torsemide (DEMADEX) 20 MG tablet Take 1.5 tablets (30 mg total) by mouth 2 (two) times daily. 03/06/16   Larey Dresser, MD  Treprostinil (TYVASO) 0.6 MG/ML SOLN Inhale 54 mcg into the lungs 4 (four) times daily. 01/07/21   Larey Dresser, MD  warfarin (COUMADIN) 5 MG tablet TAKE ONE TABLET BY MOUTH EVERY EVENING EXCEPT 1/2 TABLET ON MONDAYS OR AS DIRECTED 03/09/22   Sueanne Margarita, MD  Allergies    Patient has no known allergies.    Review of Systems   Review of Systems  Respiratory:  Positive for cough.   Gastrointestinal:  Negative for diarrhea and vomiting.  Neurological:  Positive for weakness.    Physical Exam Updated Vital Signs BP (!) 98/53   Pulse 84   Temp 100.3 F (37.9 C)   Resp (!) 22   Ht 1.575 m (_0 )   Wt 60.3 kg   SpO2 95%   BMI 24.33 kg/m  Physical Exam CONSTITUTIONAL: Elderly and frail, awake and alert HEAD: Normocephalic/atraumatic EYES: EOMI/PERRL ENMT: Mucous membranes moist NECK: supple no meningeal signs SPINE/BACK:entire spine nontender CV: Irregular LUNGS: No distress, crackles in left base ABDOMEN: soft, nontender NEURO: Pt is awake/alert, she answers most questions  appropriately, and is able to recognize her family.  She is able to move all extremities but appears globally weak and fatigued. EXTREMITIES: pulses normal/equal, full ROM SKIN: warm, color normal  ED Results / Procedures / Treatments   Labs (all labs ordered are listed, but only abnormal results are displayed) Labs Reviewed  RESP PANEL BY RT-PCR (FLU A&B, COVID) ARPGX2 - Abnormal; Notable for the following components:      Result Value   Influenza A by PCR POSITIVE (*)    All other components within normal limits  COMPREHENSIVE METABOLIC PANEL - Abnormal; Notable for the following components:   Glucose, Bld 104 (*)    BUN 27 (*)    Creatinine, Ser 2.62 (*)    Albumin 2.9 (*)    Alkaline Phosphatase 34 (*)    GFR, Estimated 18 (*)    All other components within normal limits  CBC WITH DIFFERENTIAL/PLATELET - Abnormal; Notable for the following components:   RBC 3.17 (*)    Hemoglobin 9.6 (*)    HCT 31.0 (*)    Monocytes Absolute 1.4 (*)    All other components within normal limits  URINALYSIS, ROUTINE W REFLEX MICROSCOPIC - Abnormal; Notable for the following components:   APPearance HAZY (*)    Hgb urine dipstick MODERATE (*)    Protein, ur 100 (*)    Leukocytes,Ua LARGE (*)    Bacteria, UA RARE (*)    All other components within normal limits  PROTIME-INR - Abnormal; Notable for the following components:   Prothrombin Time 31.7 (*)    INR 3.1 (*)    All other components within normal limits  TROPONIN I (HIGH SENSITIVITY) - Abnormal; Notable for the following components:   Troponin I (High Sensitivity) 83 (*)    All other components within normal limits  TROPONIN I (HIGH SENSITIVITY) - Abnormal; Notable for the following components:   Troponin I (High Sensitivity) 98 (*)    All other components within normal limits  CULTURE, BLOOD (ROUTINE X 2)  CULTURE, BLOOD (ROUTINE X 2)  URINE CULTURE  LACTIC ACID, PLASMA  BRAIN NATRIURETIC PEPTIDE  CBG MONITORING, ED     EKG EKG Interpretation  Date/Time:  Friday May 15 2022 01:16:59 EST Ventricular Rate:  79 PR Interval:    QRS Duration: 88 QT Interval:  380 QTC Calculation: 436 R Axis:   73 Text Interpretation: Atrial fibrillation Probable LVH with secondary repol abnrm Confirmed by Ripley Fraise [phone]) on 05/15/2022 1:26:31 AM  Radiology DG Chest 2 View  Result Date: 05/15/2022 CLINICAL DATA:  Coughing and weakness. EXAM: CHEST - 2 VIEW COMPARISON:  PA Lat 08/21/2013. FINDINGS: Severe global cardiomegaly, increased. There is prominence of the central pulmonary  arteries and veins, mild interstitial edema in the lower lung fields with trace pleural effusions. Slight thickening of the horizontal fissure on the right is again shown, unchanged There are bilateral infrahilar hazy opacities which could be due to atelectasis, ground-glass edema or pneumonitis. The remaining lungs are generally clear. There is aortic tortuosity and calcification with stable mediastinum. Thoracic spondylosis and mild thoracic kyphosis are again shown. Mild osteopenia. IMPRESSION: 1. Severe cardiomegaly, increased. 2. Interstitial edema in the lower lung fields with trace pleural effusions. 3. Bilateral infrahilar hazy opacities which could be due to atelectasis, ground-glass edema or pneumonitis. 4. Aortic atherosclerosis and tortuosity. Electronically Signed   By: Telford Nab M.D.   On: 05/15/2022 01:09    Procedures .Critical Care  Performed by: Ripley Fraise, MD Authorized by: Ripley Fraise, MD   Critical care provider statement:    Critical care time (minutes):  90   Critical care start time:  05/15/2022 2:45 AM   Critical care end time:  05/15/2022 4:15 AM   Critical care was necessary to treat or prevent imminent or life-threatening deterioration of the following conditions:  Renal failure, sepsis, dehydration, CNS failure or compromise and shock   Critical care was time spent personally by me on  the following activities:  Examination of patient, evaluation of patient's response to treatment, development of treatment plan with patient or surrogate, re-evaluation of patient's condition, pulse oximetry, ordering and review of radiographic studies, ordering and review of laboratory studies, ordering and performing treatments and interventions, review of old charts and obtaining history from patient or surrogate   I assumed direction of critical care for this patient from another provider in my specialty: no     Care discussed with: admitting provider       Medications Ordered in ED Medications  oseltamivir (TAMIFLU) capsule 30 mg (has no administration in time range)  cefTRIAXone (ROCEPHIN) 1 g in sodium chloride 0.9 % 100 mL IVPB (0 g Intravenous Stopped 05/15/22 0230)  azithromycin (ZITHROMAX) 500 mg in sodium chloride 0.9 % 250 mL IVPB (0 mg Intravenous Stopped 05/15/22 0341)  acetaminophen (TYLENOL) tablet 650 mg (650 mg Oral Given 05/15/22 0150)  sodium chloride 0.9 % bolus 500 mL (0 mLs Intravenous Stopped 05/15/22 0258)  lactated ringers bolus 1,000 mL (0 mLs Intravenous Stopped 05/15/22 0440)  lactated ringers bolus 1,000 mL (0 mLs Intravenous Stopped 05/15/22 0420)    ED Course/ Medical Decision Making/ A&P Clinical Course as of 05/15/22 0514  Fri May 15, 2022  0118 Hemoglobin(!): 9.6 Anemia noted [DW]  0126 Patient with cough, confusion and increasing weakness.  Strong suspicion patient has pneumonia.  IV antibiotics have been ordered [DW]  0206 Creatinine(!): 2.62 Acute kidney injury [DW]  0230 Patient is more awake and alert.  She still reports back pain, and she does have CVA tenderness.  No focal abdominal tenderness.  Given the presence of UTI, as well as acute kidney injury, will obtain CT renal to evaluate for any presence of ureteral stones.  After that patient will be admitted [DW]  0336 Patient's blood pressure is now decreasing.  Code sepsis has been called.  IV  fluids been ordered. [DW]  9373 CT scan results discussed with patient and family.  Patient is awake and alert.  She understands she will need an outpatient follow-up scan for the renal lesion.  No obstructing stones were noted. [DW]  4287 Patient also found to have influenza.  She is already been treated for pneumonia.  Her vital signs  are improving, systolic blood pressure and maps are improving.  Will admit to hospitalist [DW]  [phone] Discussed with Dr. [place] for admission.  We will give Tamiflu with renal adjustment.  Patient has received up to 2.5 L due to sepsis protocol.  She is resting comfortably, no acute respiratory distress [DW]    Clinical Course User Index [DW] Ripley Fraise, MD                           Medical Decision Making Amount and/or Complexity of Data Reviewed Labs: ordered. Decision-making details documented in ED Course. Radiology: ordered.  Risk OTC drugs. Prescription drug management. Decision regarding hospitalization.   This patient presents to the ED for concern of weakness, this involves an extensive number of treatment options, and is a complaint that carries with it a high risk of complications and morbidity.  The differential diagnosis includes but is not limited to CVA, intracranial hemorrhage, acute coronary syndrome, renal failure, urinary tract infection, electrolyte disturbance, pneumonia    Comorbidities that complicate the patient evaluation: Patient's presentation is complicated by their history of atrial fibrillation, CHF  Social Determinants of Health: Patient's  poor mobility at baseline   increases the complexity of managing their presentation  Additional history obtained: Additional history obtained from family Records reviewed  cardiology records reviewed  Lab Tests: I Ordered, and personally interpreted labs.  The pertinent results include:  UTI  Imaging Studies ordered: I ordered imaging studies including X-ray chest   I  independently visualized and interpreted imaging which showed pneumonia I agree with the radiologist interpretation  Cardiac Monitoring: The patient was maintained on a cardiac monitor.  I personally viewed and interpreted the cardiac monitor which showed an underlying rhythm of:  Atrial Fibrillation  Medicines ordered and prescription drug management: I ordered medication including rocephin/azithromycin  for pneumonia  Reevaluation of the patient after these medicines showed that the patient    improved  Critical Interventions:  IV fluids, IV antibiotics  Consultations Obtained: I requested consultation with the admitting physician dr hall , and discussed  findings as well as pertinent plan - they recommend: admit  Reevaluation: After the interventions noted above, I reevaluated the patient and found that they have :improved  Complexity of problems addressed: Patient's presentation is most consistent with  acute presentation with potential threat to life or bodily function  Disposition: After consideration of the diagnostic results and the patient's response to treatment,  I feel that the patent would benefit from admission   .           Final Clinical Impression(s) / ED Diagnoses Final diagnoses:  Acute cystitis without hematuria  Community acquired pneumonia, unspecified laterality  AKI (acute kidney injury) [facility])    Rx / [place] Orders ED Discharge Orders     None         Ripley Fraise, MD 05/15/22 [phone]

## 2022-05-15 NOTE — ED Notes (Signed)
Pt had a BM and Urinated . No measurement on the urine

## 2022-05-15 NOTE — H&P (Addendum)
History and Physical    Patient: Vicki [name] RJJ:[id number] DOB: 10-28-1940 DOA: 05/14/2022 DOS: the patient was seen and examined on 05/15/2022 PCP: Deland Pretty, MD  Patient coming from: Home-lives with daughters  Chief Complaint:  Chief Complaint  Patient presents with   Weakness   Altered Mental Status   HPI: Vicki [name] is a 81 y.o. female with medical history significant of severe pulmonary hypertension/moderate MR on chronic nocturnal oxygen at 4 L/min, TTR cardiac amyloidosis with associated HFpEF plushistory of thrombocytopenia, chronic atrial fibrillation on anticoagulation.  Patient presented to the ER after being noted at home with severe lethargy and inability to mobilize effectively.  History obtained primarily from the patient's daughters at the bedside.  They state that the patient was in her usual state of health and able to mobilize independently and function independently.  She has been having back pain for about a week.  This past Wednesday she developed a dry cough and was taking over-the-counter preparations for this Thursday she had a typical day but became progressively worse noting she was napping more than usual and then noted to have difficulty ambulating.  Her daughter helped her up the stairs and directed her towards the bedroom and went downstairs after the news when off Thursday night they went upstairs to go check her and noticed she was sitting in a chair with her head slumped forward.  They were able to awaken her and she was breathing.  EMS was called and the patient was brought to the hospital.  In the ED she was found to be febrile with a temperature of 100.3.  Initially she was normotensive during the hospital stay she developed hypotension with systolic blood pressure readings in the 70s.  Code sepsis was called and she was given appropriate fluid challenge and blood pressure readings have improved to the 90s.  Influenza A was found to be positive.  She did  not have leukocytosis and her lactic acid was actually normal at 1.2.  Creatinine was elevated above the baseline at 2.62.  Urinalysis was abnormal she has had mild elevation in her high-sensitivity troponin.  Her EKG was unremarkable.  Blood cultures and culture was obtained.  Chest x-ray revealed interstitial edema in the lower lung fields with bilateral infrahilar opacities consistent with either edema or pneumonitis.  Because of her back pain a CT renal stone study was ordered and revealed a 9.6 cm mass in the left kidney.  Patient will be admitted to the progressive care unit with a diagnosis of influenza with sepsis physiology as well as new renal mass.  Review of Systems: As mentioned in the history of present illness. All other systems reviewed and are negative.    Past Medical History:  Diagnosis Date   Arthritis    elbow   Atrial fibrillation ([place])    Cancer ([place])    Mylenoma- top of head   CHF (congestive heart failure) ([place])    Dysrhythmia    Heart murmur    On home oxygen therapy    4 bliters   Pulmonary hypertension ([place])    Seasonal allergies    Thrombocytopenia ([place]) 7/16   Past Surgical History:  Procedure Laterality Date   BREAST LUMPECTOMY Right    benign   CARDIAC CATHETERIZATION  08/12/2006   no significant disease   CARDIAC CATHETERIZATION  10/28/2012   right heart cath done   DOPPLER ECHOCARDIOGRAPHY  Aug 09 2012   severe LVH  with mild cavity obliteration. EF  55-60% without normal relaxationbut difficult to really tell total diastolic function due to a fib; sclerotic aortic valve with no stenosis. mod mitral regurg. mod to severely dilated left atrium; mod dilated rgt right ventricle w/elevated pressures, estimated peak PA presures _0  TR jet calculation. RGT ATRIUM MOD to SEVERE,     NM MYOCAR PERF WALL MOTION  2004   EXERCISE  ,no ischemia   PARS PLANA VITRECTOMY Right 04/03/2015   Procedure: PARS PLANA VITRECTOMY WITH 25 GAUGE FOR VETREOUS HEMORRHAGE,  ENDO LASER;  Surgeon: Jalene Mullet, MD;  Location: [facility];  Service: Ophthalmology;  Laterality: Right;   RIGHT HEART CATHETERIZATION N/A 09/07/2013   Procedure: RIGHT HEART CATH;  Surgeon: Larey Dresser, MD;  Location[facility] CATH LAB;  Service: Cardiovascular;  Laterality: N/A;   VAGINAL HYSTERECTOMY     Social History:  reports that she has never smoked. She has never used smokeless tobacco. She reports current alcohol use of about 3.0 standard drinks of alcohol per week. She reports that she does not use drugs.  No Known Allergies  Family History  Problem Relation Age of Onset   Heart Problems Brother    Hypertension Brother    Alzheimer's disease Mother    Heart attack Father    Thyroid disease Maternal Aunt    Pulmonary Hypertension Cousin    Heart Problems Maternal Aunt    Thyroid disease Daughter    Thyroid disease Daughter     Prior to Admission medications   Medication Sig Start Date End Date Taking? Authorizing Provider  acetaminophen (TYLENOL) 500 MG tablet Take 500 mg by mouth every 6 (six) hours as needed for mild pain or headache.     [provider]  amLODipine (NORVASC) 5 MG tablet Take 1 tablet (5 mg total) by mouth daily. 11/11/21   Larey Dresser, MD  busPIRone (BUSPAR) 5 MG tablet Take 5 mg by mouth every morning. 12/19/21   [provider]  Cholecalciferol (VITAMIN D3) 2000 units TABS Take 1 tablet by mouth daily.     [provider]  KLOR-CON M20 20 MEQ tablet TAKE TWO TABLETS BY MOUTH IN THE MORNING AND ONE IN THE EVENING 11/18/15   Larey Dresser, MD  levocetirizine (XYZAL) 5 MG tablet Take 2.5 mg by mouth daily in the afternoon.    [provider]  macitentan (OPSUMIT) 10 MG tablet TAKE 1 TABLET (10 MG TOTAL) BY MOUTH DAILY WITH BREAKFAST. 09/08/21   Larey Dresser, MD  rosuvastatin (CRESTOR) 20 MG tablet Take 20 mg by mouth at bedtime.     [provider]  tadalafil, PAH, (ADCIRCA) 20 MG tablet TAKE 2 TABLETS  DAILY GENERIC FOR ADCIRCA 05/01/21   Larey Dresser, MD  Tafamidis [facility]) 61 MG CAPS Take 1 capsule by mouth daily. 08/27/21   Larey Dresser, MD  torsemide (DEMADEX) 20 MG tablet Take 1.5 tablets (30 mg total) by mouth 2 (two) times daily. 03/06/16   Larey Dresser, MD  Treprostinil (TYVASO) 0.6 MG/ML SOLN Inhale 54 mcg into the lungs 4 (four) times daily. 01/07/21   Larey Dresser, MD  warfarin (COUMADIN) 5 MG tablet TAKE ONE TABLET BY MOUTH EVERY EVENING EXCEPT 1/2 TABLET ON MONDAYS OR AS DIRECTED 03/09/22   Sueanne Margarita, MD    Physical Exam: Vitals:   05/15/22 0500 05/15/22 0510 05/15/22 0540 05/15/22 0600  BP: (!) 91/50 (!) 94/54 (!) 104/52 (!) 105/51  Pulse: 81 72 86 87  Resp: 20 19 19  19  Temp:      TempSrc:      SpO2:  93% 90% 96%  Weight:      Height:       Constitutional: NAD remains mildly lethargic, calm, comfortable Respiratory: clear to auscultation bilaterally, no wheezing, no crackles.  Did not auscultate lung sounds posteriorly.  Normal respiratory effort. No accessory muscle use.  4 L O2 Cardiovascular: Rhythm is irregular secondary to underlying atrial fibrillation with occasional unifocal PVCs.  Loud grade 4/6 systolic murmur left sternal border over mitral.  No rubs / gallops. No extremity edema. 2+ pedal pulses.  Abdomen: no tenderness, no masses palpated. No hepatosplenomegaly. Bowel sounds positive.  Musculoskeletal: no clubbing / cyanosis. No joint deformity upper and lower extremities. Good ROM, no contractures. Normal muscle tone.  Skin: no rashes, lesions, ulcers. No induration Neurologic: CN 2-12 grossly intact. Sensation intact, . Strength 3+/5 x all 4 extremities.  Psychiatric: Lethargic but awakens to voice -to name and place.  Normal mood.   Data Reviewed:  Laboratory data: Sodium 138, potassium 4.1, chloride 103, CO2 24, glucose 104, BUN 27, creatinine 2.62, albumin 2.9, LFTs normal, troponin as follows: 83 and 98, lactic acid 1.2, white count  8100 with normal differential, hemoglobin 9.6, platelets 211,000; UA with hazy appearance, moderate hemoglobin, large leukocytes, 100 protein, rare bacteria, WBCs 21-50.  Blood cultures and urine culture pending   Diagnostic data: Chest x-ray: Interstitial edema in the lower lung fields with bilateral infra hilar hazy opacities  CT renal stone study: 9.6 cm exophytic heterogeneously mass arising from the lower pole of the left kidney possibly representing a hemorrhagic neoplasm or complex cyst with areas of recurrent intracystic hemorrhage recommendation for dedicated renal mass protocol CT or MRI Incidental finding of moderate colonic stool burden  Assessment and Plan: Sepsis physiology secondary to influenza A with secondary bacterial pneumonia Patient presented with fever, hypotension, altered mental status secondary to influenza A Lactic acid was 1.2 BP initially responded to volume replacement adding patient has received a total of 3 L of lactated Ringer's and 500 cc of normal saline Patient will be admitted to a progressive care bed Trial of Tamiflu but monitor for side effects Continue maintenance fluid for this patient since she remains lethargic and BP remains suboptimal for a patient with underlying hypertension Continue Zithromax and Rocephin for suspected secondary bacterial pneumonia  Chronic respiratory failure with hypoxia secondary to pulmonary hypertension Baseline O2 requirements 4 L/min.  Typically only uses oxygen at night and does not require it during the day even with ambulation Continue preadmission Opsumit and Adcirca  Elevated troponin Suspect related to demand ischemia since EKG unremarkable  New left renal mass Discussed with family and they confirmed this is a new finding Cr remains elevated after volume resuscitation so cannot pursue Ct at this time. Will d/w radiology to se if can perform MRI instead; d/w radiologist and appearance is c/w neoplasm- once  renal function returned to baseline can pursue imaging; if suspect additional bleeding rec is to pursue non contrasted CT Appears to be a possible hemorrhagic component. After d/w attending will dc warfarin  Cardiac amyloidosis/HFpEF Continue preadmission Tafamidis Hold Demadex Consult cardiology Daily weights and strict INO  AKI Baseline creatinine 1.05-1.49 Presenting creatinine 2.62 Hold diuretics and IV fluid hydration at 100 cc/h Follow labs Likely influenced by sepsis physiology/hypoperfusion from recent hypotension  HTN Hold Norvasc given suboptimal blood pressure readings secondary to sepsis physiology  Chronic atrial fibrillation on warfarin Currently rate controlled and experiencing  unifocal PVCs DC anticoagulation- back pain present for only one week and index of suspicion high for bleeding in renal mass as described on imaging Current INR therapeutic at 3.1  Abnormal urinalysis Secondary to dehydration Urine culture pending  Obstipation Scheduled Colace    Advance Care Planning: Full code  VTE prophylaxis: Warfarin  Consults: Cardiology  Family Communication: Daughter's at the bedside  Severity of Illness: The appropriate patient status for this patient is INPATIENT. Inpatient status is judged to be reasonable and necessary in order to provide the required intensity of service to ensure the patient's safety. The patient's presenting symptoms, physical exam findings, and initial radiographic and laboratory data in the context of their chronic comorbidities is felt to place them at high risk for further clinical deterioration. Furthermore, it is not anticipated that the patient will be medically stable for discharge from the hospital within 2 midnights of admission.   * I certify that at the point of admission it is my clinical judgment that the patient will require inpatient hospital care spanning beyond 2 midnights from the point of admission due to high  intensity of service, high risk for further deterioration and high frequency of surveillance required.*  Author: Erin Hearing, NP 05/15/2022 7:34 AM  For on call review [email or web address].

## 2022-05-15 NOTE — Consult Note (Addendum)
Advanced Heart Failure Team Consult Note   Primary Physician: Deland Pretty, MD PCP-Cardiologist:  Dr. Aundra Dubin   Reason for Consultation:  HF/PH med management in setting of AKI.   HPI:    Vicki [name] is seen today for evaluation of HF/PH med management in setting of AKI,  at the request of Erin Hearing, NP.   81 y/o AAF w/ h/o chronic diastolic heart failure, TTR cardiac amyloidosis on Tafamadil, severe PAH on 3 drug regimen w/ Tyvaso, macitentan and Adcirca, chronic hypoxic respiratory failure on 4L noctural O2, chronic atrial fibrillation, bradycardia, systemic hypertension and HLD, who presented to ED w/ increased weakness, lethargy, AMS and cough.   In ED found to be septic in setting of influenza A w/ secondary bacterial PNA. Febrile and hypotensive on admit. LA acid was only 1.2. Labs showed AKI, SCr 2.62 (b/l 1.4). BP responded to IVF resuscitation. Started on abx w/ Azithromycin and rocephin. She had also endorsed recent back pain. CT of A/P w/o contrast showed new left renal mass. Needs further imaging w/ contrast CT but awaking AKI to resolve.   AHF team asked to assist w/ HF/PH med management in setting of AKI.   She feels better. More alert today. Currently on 3L [place]. O2 sats 96%. SBPs now in 120s. SCr starting to trend down, 2.62>>2.32.   Home Tyvaso, macitentan, Adcirca and Tafamidis ordered.   Daughters at bedside   Echo (Most recent, 10/2021)  Left ventricular ejection fraction, by estimation, is 60 to 65%. The left ventricle has normal function. The left ventricle has no regional wall motion abnormalities. There is severe concentric left ventricular hypertrophy. Left ventricular diastolic parameters are indeterminate. 1. Right ventricular systolic function is normal. The right ventricular size is normal. There is moderately elevated pulmonary artery systolic pressure. The estimated right ventricular systolic pressure is 94.1 mmHg. 2. 3. Left atrial size was  severely dilated. 4. Right atrial size was mildly dilated. The mitral valve is normal in structure. Moderate mitral valve regurgitation. No evidence of mitral stenosis. 5. The aortic valve is tricuspid. There is mild calcification of the aortic valve. Aortic valve regurgitation is not visualized. Aortic valve sclerosis is present, with no evidence of aortic valve stenosis. 6. 7. Moderately dilated pulmonary artery. The inferior vena cava is normal in size with greater than 50% respiratory variability, suggesting right atrial pressure of 3 mmHg. 8. 9. The patient was in atrial fibrillation. 10. A small pericardial effusion is present. The pericardial effusion is circumferential.  Review of Systems: [y] = yes, _0  = no   General: Weight gain _1 ; Weight loss _2 ; Anorexia [ Y]; Fatigue [ Y]; Fever [Y ]; Chills _3 ; Weakness [Y ]  Cardiac: Chest pain/pressure _4 ; Resting SOB _5 ; Exertional SOB [ Y]; Orthopnea _6 ; Pedal Edema _7 ; Palpitations _8 ; Syncope _9 ; Presyncope _10 ; Paroxysmal nocturnal dyspnea_11   Pulmonary: Cough [ Y]; Wheezing_12 ; Hemoptysis_13 ; Sputum _14 ; Snoring _15   GI: Vomiting_16 ; Dysphagia_17 ; Melena_18 ; Hematochezia _19 ; Heartburn_20 ; Abdominal pain _21 ; Constipation _22 ; Diarrhea _23 ; BRBPR _24   GU: Hematuria_25 ; Dysuria _26 ; Nocturia_27   Vascular: Pain in legs with walking _28 ; Pain in feet with lying flat _29 ; Non-healing sores _30 ; Stroke _31 ; TIA _32 ; Slurred speech _33 ;  Neuro: Headaches_34 ; Vertigo_35 ; Seizures_36 ; Paresthesias_37 ;Blurred vision _38 ; Diplopia _39 ; Vision changes _40   Ortho/Skin: Arthritis _0 ; Joint pain _1 ; Muscle pain _2 ; Joint swelling _3 ; Back Pain [ Y]; Rash _4   Psych: Depression_5 ; Anxiety_6   Heme: Bleeding problems _7 ; Clotting disorders _8 ; Anemia _9   Endocrine: Diabetes _10 ; Thyroid dysfunction_11   Home Medications Prior to Admission medications   Medication Sig Start Date End Date Taking? Authorizing Provider   acetaminophen (TYLENOL) 500 MG tablet Take 500 mg by mouth every 6 (six) hours as needed for mild pain or headache.    Yes [provider]  amLODipine (NORVASC) 5 MG tablet Take 1 tablet (5 mg total) by mouth daily. 11/11/21  Yes Larey Dresser, MD  busPIRone (BUSPAR) 5 MG tablet Take 5 mg by mouth every morning. 12/19/21  Yes [provider]  Cholecalciferol (VITAMIN D3) 2000 units TABS Take 1 tablet by mouth daily.    Yes [provider]  Dextromethorphan HBr (DELSYM PO) Take by mouth as needed (cough).   Yes [provider]  KLOR-CON M20 20 MEQ tablet TAKE TWO TABLETS BY MOUTH IN THE MORNING AND ONE IN THE EVENING Patient taking differently: Take 40 mEq by mouth every evening. 11/18/15  Yes Larey Dresser, MD  macitentan (OPSUMIT) 10 MG tablet TAKE 1 TABLET (10 MG TOTAL) BY MOUTH DAILY WITH BREAKFAST. Patient taking differently: Take 10 mg by mouth daily. 09/08/21  Yes Larey Dresser, MD  rosuvastatin (CRESTOR) 20 MG tablet Take 20 mg by mouth at bedtime.    Yes [provider]  tadalafil, PAH, (ADCIRCA) 20 MG tablet TAKE 2 TABLETS DAILY GENERIC FOR ADCIRCA Patient taking differently: Take 40 mg by mouth every evening. 05/01/21  Yes Larey Dresser, MD  Tafamidis [facility]) 61 MG CAPS Take 1 capsule by mouth daily. Patient taking differently: Take 61 mg by mouth daily. 08/27/21  Yes Larey Dresser, MD  torsemide (DEMADEX) 20 MG tablet Take 1.5 tablets (30 mg total) by mouth 2 (two) times daily. 03/06/16  Yes Larey Dresser, MD  Treprostinil (TYVASO) 0.6 MG/ML SOLN Inhale 54 mcg into the lungs 4 (four) times daily. Patient taking differently: Inhale into the lungs See admin instructions. 4 puffs by mouth 3 times daily. 01/07/21  Yes Larey Dresser, MD  warfarin (COUMADIN) 5 MG tablet TAKE ONE TABLET BY MOUTH EVERY EVENING EXCEPT 1/2 TABLET ON MONDAYS OR AS DIRECTED Patient taking differently: Take 2.5-5 mg by mouth See admin instructions. Take 5  mg by mouth on Tuesday, Wednesday, Thursday, Friday, Saturday, and Sunday evening. Take 2.5 mg by mouth on Monday evening. 03/09/22  Yes Turner, Eber Hong, MD  levocetirizine (XYZAL) 5 MG tablet Take 2.5 mg by mouth daily in the afternoon. Patient not taking: Reported on 05/15/2022    [provider]    Past Medical History: Past Medical History:  Diagnosis Date   Arthritis    elbow   Atrial fibrillation ([place])    Cancer ([place])    Mylenoma- top of head   CHF (congestive heart failure) ([place])    Dysrhythmia    Heart murmur    On home oxygen therapy    4 bliters   Pulmonary hypertension ([place])    Seasonal allergies    Thrombocytopenia ([place]) 7/16    Past Surgical History: Past Surgical History:  Procedure Laterality Date   BREAST LUMPECTOMY Right    benign   CARDIAC CATHETERIZATION  08/12/2006   no significant disease   CARDIAC CATHETERIZATION  10/28/2012   right  heart cath done   DOPPLER ECHOCARDIOGRAPHY  Aug 09 2012   severe LVH  with mild cavity obliteration. EF 55-60% without normal relaxationbut difficult to really tell total diastolic function due to a fib; sclerotic aortic valve with no stenosis. mod mitral regurg. mod to severely dilated left atrium; mod dilated rgt right ventricle w/elevated pressures, estimated peak PA presures _0  TR jet calculation. RGT ATRIUM MOD to SEVERE,     NM MYOCAR PERF WALL MOTION  2004   EXERCISE  ,no ischemia   PARS PLANA VITRECTOMY Right 04/03/2015   Procedure: PARS PLANA VITRECTOMY WITH 25 GAUGE FOR VETREOUS HEMORRHAGE, ENDO LASER;  Surgeon: Jalene Mullet, MD;  Location: [facility];  Service: Ophthalmology;  Laterality: Right;   RIGHT HEART CATHETERIZATION N/A 09/07/2013   Procedure: RIGHT HEART CATH;  Surgeon: Larey Dresser, MD;  Location: [facility] CATH LAB;  Service: Cardiovascular;  Laterality: N/A;   VAGINAL HYSTERECTOMY      Family History: Family History  Problem Relation Age of Onset   Heart Problems Brother    Hypertension Brother     Alzheimer's disease Mother    Heart attack Father    Thyroid disease Maternal Aunt    Pulmonary Hypertension Cousin    Heart Problems Maternal Aunt    Thyroid disease Daughter    Thyroid disease Daughter     Social History: Social History   Socioeconomic History   Marital status: Widowed    Spouse name: Not on file   Number of children: Not on file   Years of education: Not on file   Highest education level: Not on file  Occupational History   Occupation: retired  Tobacco Use   Smoking status: Never   Smokeless tobacco: Never  Substance and Sexual Activity   Alcohol use: Yes    Alcohol/week: 3.0 standard drinks of alcohol    Types: 2 Glasses of wine, 1 Cans of beer per week   Drug use: No   Sexual activity: Not on file  Other Topics Concern   Not on file  Social History Narrative   Not on file   Social Determinants of Health   Financial Resource Strain: Not on file  Food Insecurity: Not on file  Transportation Needs: Not on file  Physical Activity: Not on file  Stress: Not on file  Social Connections: Not on file    Allergies:  No Known Allergies  Objective:    Vital Signs:   Temp:  [97 F (36.1 C)-100.3 F (37.9 C)] 97 F (36.1 C) (11/17 0849) Pulse Rate:  [57-93] 64 (11/17 1045) Resp:  [12-27] 13 (11/17 1045) BP: (67-120)/(42-69) 98/60 (11/17 1045) SpO2:  [90 %-100 %] 95 % (11/17 1045) Weight:  [60.3 kg] 60.3 kg (11/17 0005)    Weight change: Filed Weights   05/15/22 0005  Weight: 60.3 kg    Intake/Output:   Intake/Output Summary (Last 24 hours) at 05/15/2022 1129 Last data filed at 05/15/2022 0440 Gross per 24 hour  Intake 2850 ml  Output --  Net 2850 ml      Physical Exam    General:  elderly female, on 3L [place]. Normal WOB HEENT: normal Neck: supple. JVP 12 cm . Carotids 2+ bilat; no bruits. No lymphadenopathy or thyromegaly appreciated. Cor: PMI nondisplaced. Irregularly irregular rhythm and rate. 3/6 MR murmur  Lungs:  decreased BS at the bases bilaterally  Abdomen: soft, nontender, nondistended. No hepatosplenomegaly. No bruits or masses. Good bowel sounds. Extremities: no cyanosis, clubbing, rash, edema Neuro: alert &  orientedx3, cranial nerves grossly intact. moves all 4 extremities w/o difficulty. Affect pleasant   Telemetry   Chronic atrial fibrillation 70s   EKG    Afib 79 bmp   Labs   Basic Metabolic Panel: Recent Labs  Lab 05/15/22 0023 05/15/22 0855  NA 138 139  K 4.1 3.6  CL 103 107  CO2 24 24  GLUCOSE 104* 95  BUN 27* 25*  CREATININE 2.62* 2.32*  CALCIUM 9.2 8.4*    Liver Function Tests: Recent Labs  Lab 05/15/22 0023 05/15/22 0855  AST 29 25  ALT 11 8  ALKPHOS 34* 28*  BILITOT 0.6 0.5  PROT 7.2 5.6*  ALBUMIN 2.9* 2.2*   No results for input(s): "LIPASE", "AMYLASE" in the last 168 hours. No results for input(s): "AMMONIA" in the last 168 hours.  CBC: Recent Labs  Lab 05/15/22 0023  WBC 8.1  NEUTROABS 5.6  HGB 9.6*  HCT 31.0*  MCV 97.8  PLT 211    Cardiac Enzymes: No results for input(s): "CKTOTAL", "CKMB", "CKMBINDEX", "TROPONINI" in the last 168 hours.  BNP: BNP (last 3 results) Recent Labs    11/11/21 1415 03/31/22 1441 05/15/22 0023  BNP 477.3* 1,091.3* 767.4*    ProBNP (last 3 results) No results for input(s): "PROBNP" in the last 8760 hours.   CBG: Recent Labs  Lab 05/15/22 0037  GLUCAP 89    Coagulation Studies: Recent Labs    05/15/22 0137  LABPROT 31.7*  INR 3.1*     Imaging   CT Renal Stone Study  Result Date: 05/15/2022 CLINICAL DATA:  Abdominal/flank pain, stone suspected. EXAM: CT ABDOMEN AND PELVIS WITHOUT CONTRAST TECHNIQUE: Multidetector CT imaging of the abdomen and pelvis was performed following the standard protocol without IV contrast. RADIATION DOSE REDUCTION: This exam was performed according to the departmental dose-optimization program which includes automated exposure control, adjustment of the mA  and/or kV according to patient size and/or use of iterative reconstruction technique. COMPARISON:  None Available. FINDINGS: Lower chest: Nodular infiltrate within the a visualized left lung base may be related to infection or aspiration. No pleural effusion. Moderate cardiomegaly noted. Small pericardial effusion. Hepatobiliary: Cholelithiasis noted without pericholecystic inflammatory change identified. Liver unremarkable. No intra or extrahepatic biliary ductal dilation. Pancreas: Unremarkable Spleen: Unremarkable Adrenals/Urinary Tract: The adrenal glands are unremarkable. The kidneys are normal in size and position. A a exophytic heterogeneously hyperdense mass arises from the lower pole of the left kidney measuring 8.6 x 9.6 x 0.5 cm in greatest dimension which is not well characterized on this noncontrast examination. This may represent a hemorrhagic neoplasm or, less likely, a complex cyst with areas of recurrent intracystic hemorrhage. No hydronephrosis. No intrarenal or ureteral calculi. The bladder is unremarkable. Stomach/Bowel: The stomach, small bowel, and large bowel are unremarkable save for moderate colonic stool burden. No evidence of obstruction or focal inflammation. No free intraperitoneal gas or fluid. Vascular/Lymphatic: Aortic atherosclerosis. No enlarged abdominal or pelvic lymph nodes. Reproductive: Status post hysterectomy. No adnexal masses. Other: Small fat containing right superior lumbar hernia. Musculoskeletal: No acute bone abnormality. No lytic or blastic bone lesion. Degenerative changes noted in the lumbar spine at L4-5 with associated grade 1 anterolisthesis. IMPRESSION: 1. 9.6 cm exophytic heterogeneously hyperdense mass arising from the lower pole of the left kidney, not well characterized on this noncontrast examination. This may represent a hemorrhagic neoplasm or, less likely, a complex cyst with areas of recurrent intracystic hemorrhage. Dedicated renal mass protocol CT or  MRI examination is recommended for further  evaluation. 2. Nodular infiltrate within the visualized left lung base, possibly related to infection or aspiration. 3. Moderate cardiomegaly. Small pericardial effusion. 4. Cholelithiasis. 5. Moderate colonic stool burden. 6. Aortic atherosclerosis. Aortic Atherosclerosis (ICD10-I70.0). Electronically Signed   By: Fidela Salisbury M.D.   On: 05/15/2022 04:37   DG Chest 2 View  Result Date: 05/15/2022 CLINICAL DATA:  Coughing and weakness. EXAM: CHEST - 2 VIEW COMPARISON:  PA Lat 08/21/2013. FINDINGS: Severe global cardiomegaly, increased. There is prominence of the central pulmonary arteries and veins, mild interstitial edema in the lower lung fields with trace pleural effusions. Slight thickening of the horizontal fissure on the right is again shown, unchanged There are bilateral infrahilar hazy opacities which could be due to atelectasis, ground-glass edema or pneumonitis. The remaining lungs are generally clear. There is aortic tortuosity and calcification with stable mediastinum. Thoracic spondylosis and mild thoracic kyphosis are again shown. Mild osteopenia. IMPRESSION: 1. Severe cardiomegaly, increased. 2. Interstitial edema in the lower lung fields with trace pleural effusions. 3. Bilateral infrahilar hazy opacities which could be due to atelectasis, ground-glass edema or pneumonitis. 4. Aortic atherosclerosis and tortuosity. Electronically Signed   By: Telford Nab M.D.   On: 05/15/2022 01:09     Medications:     Current Medications:  busPIRone  5 mg Oral q morning   cholecalciferol  1,000 Units Oral Daily   docusate sodium  100 mg Oral BID   macitentan  10 mg Oral Daily   [START ON 05/16/2022] oseltamivir  30 mg Oral Daily   rosuvastatin  20 mg Oral QHS   tadalafil (PAH)  20 mg Oral Daily   Tafamidis  1 capsule Oral Daily   Treprostinil  54 mcg Inhalation QID    Infusions:  azithromycin     cefTRIAXone (ROCEPHIN)  IV     dextrose 5 % and  0.9% NaCl 100 mL/hr at 05/15/22 1027      Patient Profile   81 y/o AAF w/ h/o chronic diastolic heart failure, TTR cardiac amyloidosis on Tafamadil, severe PAH on 3 drug regimen w/ Tyvaso, macitentan and Adcirca, chronic hypoxic respiratory failure on 4L noctural O2, chronic atrial fibrillation, bradycardia, systemic hypertension and HLD admitted w/ sepsis due to Influenza A and secondary bacterial PNA + AKI and discovery of new Lt renal mass. AHF team consulted for HF/PH med management in setting of AKI.  Assessment/Plan   1. Chronic Diastolic Heart Failure/ TTR Cardiac Amyloidosis  - Echo 5/23  EF 60-65%, severe concentric LVH, normal RV, PASP 49 mmHg, moderate MR, small pericardial effusion.  - +  PYP scan in 12/20: grade 2 with H/CL 1.97 but activity was localized to the atria.  think that her PYP scan is indicative of isolated atrial ATTR cardiac amyloidosis. This may be an earlier manifestation before LV involvement.  - continue tafamadis  - BNP 767 on admit. Got IVFs for sepsis resuscitation. CXR Interstitial edema in the lower lung fields, trace pleural effusions  - will stop IVFs  - will need to restart diuretics, likely tomorrow. Follow SCr   2. Severe PAH  - RHC in 3/15 showed CI 1.97, PVR 9. 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 Collagen vascular disease workup was negative (negative RF, ANA and negative anti-SCL-70).  V/Q scan was not suggestive of chronic PEs.  PFTs were suggestive of restrictive lung disease but CT chest and evaluation by pulmonary did not suggest interstitial lung disease.  Sleep study did not show OSA.  - Most Recent Echo  5/23 EF 60-65%, severe concentric LVH, normal RV, PASP 49 mmHg  - Continue Tyvaso, macitentan, Adcirca   3. Sepsis 2/2 Influenza A/ PNA - Improved w/ IVF resuscitation and tx w/ abx  - continue azithromycin + ceftriaxone per IM - supportive care. Supp O2 and Tamiflu   4. AKI  - Scr 1.4>>2.62 - likely due to hypotension/  sepsis - improving, SCr 2.32 today  - BP improved but remains soft, avoid further hypotension  - continue to hold home amlodipine for now   5. New Left Renal Mass - noted on noncontrast CT of A/P - needs further imaging w/ contrast CT once AKI resolves - per primary team  6. Chronic Atrial Fibrillation  - rate controlled, 79 bpm  - off AV nodal blockers due to bradycardia  - on coumadin PTA, currently on hold. INR supra therapeutic 3.1   7. Mitral Regurgitation  - likely functional, due to severe LAE - mod on recent echo   Length of Stay: [place], PA-C  05/15/2022, 11:29 AM  Advanced Heart Failure Team Pager [phone] (M-F; 7a - 5p)  Please contact [facility] for night-coverage after hours (4p -7a ) and weekends on [email or web address]  Patient seen and examined with the above-signed Advanced Practice Provider and/or Housestaff. I personally reviewed laboratory data, imaging studies and relevant notes. I independently examined the patient and formulated the important aspects of the plan. I have edited the note to reflect any of my changes or salient points. I have personally discussed the plan with the patient and/or family.  81 y/o woman with HFpEF due to TTR cardiac amyloidosis, PAH, permanent AF.  Presented to ER with lethargy/AMS and cough. Found to have influenza A (not vaccinated) and PNA. Course c/b AKI. Treated with IVF, abx and Tamiflu.   Now feeling better. BP improved SCR improving  General:  Elderly  No resp difficulty HEENT: normal Neck: supple.JVP 7-8 . Carotids 2+ bilat; no bruits. No lymphadenopathy or thryomegaly appreciated. Cor: PMI nondisplaced. Irregular rate & rhythm. 3/6 TR Lungs: crackles left base Abdomen: soft, nontender, nondistended. No hepatosplenomegaly. No bruits or masses. Good bowel sounds. Extremities: no cyanosis, clubbing, rash, edema Neuro: alert & orientedx3, cranial nerves grossly intact. moves all 4 extremities w/o difficulty.  Affect pleasant  She is improved with aggressive resuscitation. Will stop IVF. I spoe with PharmD and will start [place] meds. Can restart warfarin for AF.   Glori Bickers, MD  7:00 PM

## 2022-05-15 NOTE — Progress Notes (Signed)
Pharmacy Consult for Pulmonary Hypertension Treatment   Indication - Continuation of prior to admission medication   Patient is 81 y.o.  with history of PAH on chronic Macitentan (Opsumit) PTA and will be continued while hospitalized.   Continuing this medication order as an inpatient requires that monitoring parameters per REMS requirements must be met.  Chronic therapy is under the supervision of Dr Aundra Dubin who is enrolled in the REMS program and is being notified of continuation of therapy. A staff message in EPIC has been sent notifying the certified prescriber.  Per patient report has previously been educated on Pregnancy Risk and Hepatotoxicity . On admission pregnancy risk has been assessed and no monitoring required given postmenopausal. Hepatic function has been evaluated. AST/ALT appropriate to continue medication at this time.     Latest Ref Rng & Units 05/15/2022    8:55 AM 05/15/2022   12:23 AM 03/29/2017   10:28 AM  Hepatic Function  Total Protein 6.5 - 8.1 g/dL 5.6  7.2  7.5   Albumin 3.5 - 5.0 g/dL 2.2  2.9  4.2   AST 15 - 41 U/L _0 ALT 0 - 44 U/L _1 Alk Phosphatase 38 - 126 U/L 28  34  37   Total Bilirubin 0.3 - 1.2 mg/dL 0.5  0.6  0.5   Bilirubin, Direct 0.0 - 0.2 mg/dL 0.2       If any question arise or pregnancy is identified during hospitalization, contact for bosentan: [phone]; macitentan: [phone]; ambrisentan: [phone].  Thank for you allowing [place] to participate in the care of this patient.   Antonietta Jewel, PharmD, [place] Clinical Pharmacist  Phone: [phone] 05/15/2022 12:02 PM  Please check AMION for all [place] phone numbers After 10:00 PM, call [place] [phone]

## 2022-05-15 NOTE — ED Notes (Signed)
Pt placed on 2L [place] at this time.

## 2022-05-15 NOTE — ED Provider Triage Note (Signed)
Emergency Medicine Provider Triage Evaluation Note  Vicki [name] , a 81 y.o. female  was evaluated in triage.  Pt complains of altered mental status. Patients family states that the patient has been complaining of cough and back pain for the past few days. Was able to walk up the stairs without assistance, but around 11 pm tonight became more confused and was unable to ambulate without assistance. Currently, patient is able to tell me she is in the ER and her name, is unable to tell year or her birthday. She is having difficulty following commands. Family states she has been becoming more confused lately but has not been formally diagnosed with dementia.   Review of Systems  Positive:  Negative:   Physical Exam  BP 111/60   Pulse 83   Temp 100.3 F (37.9 C)   Resp 16   Ht _0  (1.575 m)   Wt 60.3 kg   SpO2 98%   BMI 24.33 kg/m  Gen:   Awake, no distress   Resp:  Normal effort  MSK:   Moves extremities without difficulty  Other:  Moving extremities equally but having difficulty following commands.  Medical Decision Making  Medically screening exam initiated at 12:16 AM.  Appropriate orders placed.  Vicki [name] was informed that the remainder of the evaluation will be completed by another provider, this initial triage assessment does not replace that evaluation, and the importance of remaining in the ED until their evaluation is complete.  AMS work-up initiated   Nestor Lewandowsky 05/15/22 0022

## 2022-05-16 ENCOUNTER — Inpatient Hospital Stay (HOSPITAL_COMMUNITY): Payer: Medicare HMO

## 2022-05-16 DIAGNOSIS — N179 Acute kidney failure, unspecified: Secondary | ICD-10-CM

## 2022-05-16 DIAGNOSIS — I4891 Unspecified atrial fibrillation: Secondary | ICD-10-CM | POA: Diagnosis not present

## 2022-05-16 DIAGNOSIS — J09X1 Influenza due to identified novel influenza A virus with pneumonia: Secondary | ICD-10-CM | POA: Diagnosis not present

## 2022-05-16 LAB — BASIC METABOLIC PANEL
Anion gap: 10 (ref 5–15)
BUN: 26 mg/dL — ABNORMAL HIGH (ref 8–23)
CO2: 23 mmol/L (ref 22–32)
Calcium: 8.5 mg/dL — ABNORMAL LOW (ref 8.9–10.3)
Chloride: 107 mmol/L (ref 98–111)
Creatinine, Ser: 2 mg/dL — ABNORMAL HIGH (ref 0.44–1.00)
GFR, Estimated: 25 mL/min — ABNORMAL LOW (ref >60)
Glucose, Bld: 99 mg/dL (ref 70–99)
Potassium: 3.7 mmol/L (ref 3.5–5.1)
Sodium: 140 mmol/L (ref 135–145)

## 2022-05-16 LAB — PROTIME-INR
INR: 3.2 — ABNORMAL HIGH (ref 0.8–1.2)
Prothrombin Time: 32.7 seconds — ABNORMAL HIGH (ref 11.4–15.2)

## 2022-05-16 LAB — CBC
HCT: 27.8 % — ABNORMAL LOW (ref 36.0–46.0)
Hemoglobin: 8.8 g/dL — ABNORMAL LOW (ref 12.0–15.0)
MCH: 30.4 pg (ref 26.0–34.0)
MCHC: 31.7 g/dL (ref 30.0–36.0)
MCV: 96.2 fL (ref 80.0–100.0)
Platelets: 206 10*3/uL (ref 150–400)
RBC: 2.89 MIL/uL — ABNORMAL LOW (ref 3.87–5.11)
RDW: 15.4 % (ref 11.5–15.5)
WBC: 5.6 10*3/uL (ref 4.0–10.5)
nRBC: 0 % (ref 0.0–0.2)

## 2022-05-16 LAB — TSH: TSH: 0.814 u[IU]/mL (ref 0.350–4.500)

## 2022-05-16 LAB — URINE CULTURE: Culture: <10000 — AB

## 2022-05-16 LAB — FOLATE: Folate: 10.1 ng/mL (ref >5.9)

## 2022-05-16 LAB — PROCALCITONIN: Procalcitonin: 1.02 ng/mL

## 2022-05-16 MED ORDER — TORSEMIDE 20 MG PO TABS
30.0000 mg | ORAL_TABLET | Freq: Two times a day (BID) | ORAL | Status: DC
  Administered 2022-05-17 – 2022-05-19 (×5): 30 mg via ORAL
  Filled 2022-05-16 (×5): qty 2

## 2022-05-16 MED ORDER — TORSEMIDE 20 MG PO TABS: 30.0000 mg | ORAL_TABLET | Freq: Two times a day (BID) | ORAL | Status: DC

## 2022-05-16 NOTE — Evaluation (Signed)
Physical Therapy Evaluation Patient Details Name: Vicki [name] MRN: [id number] DOB: [birth year] Today's Date: 05/16/2022  History of Present Illness  81 y.o. female with medical history significant of severe pulmonary hypertension/moderate MR on chronic nocturnal oxygen at 4 L/min, TTR cardiac amyloidosis with associated HFpEF plushistory of thrombocytopenia, chronic atrial fibrillation on anticoagulation.  Patient presented with severe lethargy and inability to walk effectively.  Clinical Impression  Patient admitted with the above. Per son, patient is planning on moving in with her daughter for closer supervision. Patient currently at supervision level for mobility with no AD and able to negotiate stairs with close supervision and use of rail. Family able to provide necessary supervision/assistance level at discharge. No further skilled PT needs identified acutely. No PT follow up recommended at this time.        Recommendations for follow up therapy are one component of a multi-disciplinary discharge planning process, led by the attending physician.  Recommendations may be updated based on patient status, additional functional criteria and insurance authorization.  Follow Up Recommendations No PT follow up      Assistance Recommended at Discharge Frequent or constant Supervision/Assistance  Patient can return home with the following  Assist for transportation;Direct supervision/assist for financial management;Direct supervision/assist for medications management;Assistance with cooking/housework;Help with stairs or ramp for entrance    Equipment Recommendations None recommended by PT  Recommendations for Other Services       Functional Status Assessment Patient has had a recent decline in their functional status and demonstrates the ability to make significant improvements in function in a reasonable and predictable amount of time.     Precautions / Restrictions  Precautions Precautions: Fall Restrictions Weight Bearing Restrictions: No      Mobility  Bed Mobility               General bed mobility comments: up in recliner    Transfers Overall transfer level: Needs assistance Equipment used: None Transfers: Sit to/from Stand Sit to Stand: Supervision           General transfer comment: supervision for safety    Ambulation/Gait Ambulation/Gait assistance: Supervision Gait Distance (Feet): 250 Feet Assistive device: None Gait Pattern/deviations: Step-through pattern, Decreased stride length Gait velocity: decreased     General Gait Details: supervision for safety. no overt LOB  Stairs Stairs: Yes Stairs assistance: Supervision Stair Management: One rail Right, Alternating pattern, Forwards Number of Stairs: 8 General stair comments: supervision for safety. Use of R handrail for support  Wheelchair Mobility    Modified Rankin (Stroke Patients Only)       Balance Overall balance assessment: Mild deficits observed, not formally tested                                           Pertinent Vitals/Pain Pain Assessment Pain Assessment: No/denies pain    Home Living Family/patient expects to be discharged to:: Private residence Living Arrangements: Children Available Help at Discharge: Family;Available 24 hours/day Type of Home: House Home Access: Stairs to enter Entrance Stairs-Rails: Psychiatric nurse of Steps: 5-6 Alternate Level Stairs-Number of Steps: flight Home Layout: Two level;Bed/bath upstairs Home Equipment: None      Prior Function Prior Level of Function : Needs assist  Cognitive Assist : ADLs (cognitive)   ADLs (Cognitive): Intermittent cues         ADLs Comments: Supervision with showers, assist with meds  and bill payment.  Patient does not drive.     Hand Dominance   Dominant Hand: Right    Extremity/Trunk Assessment   Upper Extremity  Assessment Upper Extremity Assessment: Defer to OT evaluation    Lower Extremity Assessment Lower Extremity Assessment: Overall WFL for tasks assessed    Cervical / Trunk Assessment Cervical / Trunk Assessment: Kyphotic  Communication   Communication: No difficulties  Cognition Arousal/Alertness: Awake/alert Behavior During Therapy: WFL for tasks assessed/performed Overall Cognitive Status: History of cognitive impairments - at baseline                                 General Comments: hx of dementia        General Comments      Exercises     Assessment/Plan    PT Assessment Patient does not need any further PT services  PT Problem List         PT Treatment Interventions      PT Goals (Current goals can be found in the Care Plan section)  Acute Rehab PT Goals Patient Stated Goal: to go home PT Goal Formulation: All assessment and education complete, DC therapy    Frequency       Co-evaluation               AM-PAC PT "6 Clicks" Mobility  Outcome Measure Help needed turning from your back to your side while in a flat bed without using bedrails?: A Little Help needed moving from lying on your back to sitting on the side of a flat bed without using bedrails?: A Little Help needed moving to and from a bed to a chair (including a wheelchair)?: A Little Help needed standing up from a chair using your arms (e.g., wheelchair or bedside chair)?: A Little Help needed to walk in hospital room?: A Little Help needed climbing 3-5 steps with a railing? : A Little 6 Click Score: 18    End of Session   Activity Tolerance: Patient tolerated treatment well Patient left: in chair;with call bell/phone within reach;with family/visitor present Nurse Communication: Mobility status PT Visit Diagnosis: Muscle weakness (generalized) (M62.81)    Time: 1257-1310 PT Time Calculation (min) (ACUTE ONLY): 13 min   Charges:   PT Evaluation $PT Eval Low  Complexity: 1 Low          Lucille Crichlow A. Gilford Rile PT, DPT Acute Rehabilitation Services Office [phone]   Linna Hoff 05/16/2022, 2:42 PM

## 2022-05-16 NOTE — Evaluation (Signed)
Occupational Therapy Evaluation Patient Details Name: Vicki [name] MRN: [id number] DOB: 01-08-1941 Today's Date: 05/16/2022   History of Present Illness 81 y.o. female with medical history significant of severe pulmonary hypertension/moderate MR on chronic nocturnal oxygen at 4 L/min, TTR cardiac amyloidosis with associated HFpEF plushistory of thrombocytopenia, chronic atrial fibrillation on anticoagulation.  Patient presented with severe lethargy and inability to walk effectively.   Clinical Impression   Patient admitted for the diagnosis above.  Per son, she is close to her baseline for in room mobility and ADL completion from a sit to stand level.  Patient is transitioning from home alone, to living with her daughter.  Given new home setup and that family can provide any assist needed, no post acute OT is anticipated.  No further needs in the acute setting from OT.        Recommendations for follow up therapy are one component of a multi-disciplinary discharge planning process, led by the attending physician.  Recommendations may be updated based on patient status, additional functional criteria and insurance authorization.   Follow Up Recommendations  No OT follow up     Assistance Recommended at Discharge Intermittent Supervision/Assistance  Patient can return home with the following Assist for transportation;Direct supervision/assist for financial management;Direct supervision/assist for medications management    Functional Status Assessment  Patient has not had a recent decline in their functional status  Equipment Recommendations  None recommended by OT    Recommendations for Other Services       Precautions / Restrictions Precautions Precautions: Fall Restrictions Weight Bearing Restrictions: No      Mobility Bed Mobility               General bed mobility comments: up in recliner    Transfers Overall transfer level: Needs assistance   Transfers: Sit  to/from Stand Sit to Stand: Supervision           General transfer comment: Able to walk to the bathroom with supervision      Balance Overall balance assessment: Mild deficits observed, not formally tested                                         ADL either performed or assessed with clinical judgement   ADL Overall ADL's : At baseline                                       General ADL Comments: Generalized supervision and VC's due to new environment.     Vision Baseline Vision/History: 1 Wears glasses Patient Visual Report: No change from baseline       Perception     Praxis      Pertinent Vitals/Pain Pain Assessment Pain Assessment: No/denies pain     Hand Dominance Right   Extremity/Trunk Assessment Upper Extremity Assessment Upper Extremity Assessment: Overall WFL for tasks assessed   Lower Extremity Assessment Lower Extremity Assessment: Defer to PT evaluation   Cervical / Trunk Assessment Cervical / Trunk Assessment: Kyphotic   Communication Communication Communication: No difficulties   Cognition Arousal/Alertness: Awake/alert Behavior During Therapy: WFL for tasks assessed/performed Overall Cognitive Status: History of cognitive impairments - at baseline  General Comments   VSS on RA    Exercises     Shoulder Instructions      Home Living Family/patient expects to be discharged to:: Private residence Living Arrangements: Children Available Help at Discharge: Family;Available 24 hours/day Type of Home: House Home Access: Stairs to enter [employer] of Steps: 5-6 Entrance Stairs-Rails: Right;Left Home Layout: Two level;Bed/bath upstairs Alternate Level Stairs-Number of Steps: flight   Bathroom Shower/Tub: Teacher, early years/pre: Standard Bathroom Accessibility: Yes How Accessible: Accessible via walker Home Equipment: None           Prior Functioning/Environment Prior Level of Function : Needs assist  Cognitive Assist : ADLs (cognitive)   ADLs (Cognitive): Intermittent cues         ADLs Comments: Supervision with showers, assist with meds and bill payment.  Patient does not drive.        OT Problem List: Decreased cognition;Impaired balance (sitting and/or standing)      OT Treatment/Interventions:      OT Goals(Current goals can be found in the care plan section) Acute Rehab OT Goals Patient Stated Goal: Return home OT Goal Formulation: With patient Time For Goal Achievement: 05/22/22 Potential to Achieve Goals: Good  OT Frequency:      Co-evaluation              AM-PAC OT "6 Clicks" Daily Activity     Outcome Measure Help from another person eating meals?: None Help from another person taking care of personal grooming?: None Help from another person toileting, which includes using toliet, bedpan, or urinal?: A Little Help from another person bathing (including washing, rinsing, drying)?: A Little Help from another person to put on and taking off regular upper body clothing?: None Help from another person to put on and taking off regular lower body clothing?: A Little 6 Click Score: 21   End of Session Nurse Communication: Mobility status  Activity Tolerance: Patient tolerated treatment well Patient left: in chair;with call bell/phone within reach;with family/visitor present  OT Visit Diagnosis: Unsteadiness on feet (R26.81)                Time: 7076-1518 OT Time Calculation (min): 22 min Charges:  OT General Charges $OT Visit: 1 Visit OT Evaluation $OT Eval Moderate Complexity: 1 Mod  05/16/2022  RP, OTR/L  Acute Rehabilitation Services  Office:  [phone]   Metta Clines 05/16/2022, 12:46 PM

## 2022-05-16 NOTE — Progress Notes (Signed)
ANTICOAGULATION CONSULT NOTE - Initial Consult  Pharmacy Consult for warfarin Indication: atrial fibrillation  No Known Allergies  Patient Measurements: Height: _0  (157.5 cm) Weight: 61.4 kg (135 lb 5.8 oz) IBW/kg (Calculated) : 50.1  Labs: Recent Labs    05/15/22 0023 05/15/22 0137 05/15/22 0231 05/15/22 0855 05/15/22 1315 05/16/22 0217  HGB 9.6*  --   --   --  9.5* 8.8*  HCT 31.0*  --   --   --  31.3* 27.8*  PLT 211  --   --   --  204 206  LABPROT  --  31.7*  --   --   --  32.7*  INR  --  3.1*  --   --   --  3.2*  CREATININE 2.62*  --   --  2.32*  --  2.00*  TROPONINIHS 83*  --  98*  --   --   --    Estimated Creatinine Clearance: 19 mL/min (A) (by C-G formula based on SCr of 2 mg/dL (H)).  Medical History: Past Medical History:  Diagnosis Date   Arthritis    elbow   Atrial fibrillation (HCC)    Cancer (HCC)    Mylenoma- top of head   CHF (congestive heart failure) (HCC)    Dysrhythmia    Heart murmur    On home oxygen therapy    4 bliters   Pulmonary hypertension (HCC)    Seasonal allergies    Thrombocytopenia (HCC) 7/16   Medications:  Scheduled:   busPIRone  5 mg Oral q morning   cholecalciferol  1,000 Units Oral Daily   docusate sodium  100 mg Oral BID   macitentan  10 mg Oral Daily   oseltamivir  30 mg Oral Daily   rosuvastatin  20 mg Oral QHS   tadalafil  20 mg Oral Daily   Tafamidis  1 capsule Oral Daily   [START ON 05/17/2022] torsemide  30 mg Oral BID   Treprostinil  54 mcg Inhalation QID   Infusions:   azithromycin 500 mg (05/15/22 2300)   cefTRIAXone (ROCEPHIN)  IV 1 g (05/16/22 0000)   Assessment: 71 yof with history of afib on warfarin PTA presenting with weakness and AMS, initiated on ceftriaxone/azithromycin for CAP/UTI. INR slightly supratherapeutic at 3.2. Hgb decreased to 8.8, plt WNL. No bleed issues reported. Noted DDI with azithromycin - may cause INR to increase. Patient also with AKI. Pharmacy consulted to dose warfarin  inpatient.   PTA warfarin dose: 63m daily except 2.591mon Mondays (last dose 05/14/22 PTA)  Goal of Therapy:  INR 2-3 Monitor platelets by anticoagulation protocol: Yes   Plan:  Hold warfarin Will recheck INR with daily labs on 11/19 Monitor daily INR, CBC, and for s/sx of bleeding  KaJeneen [id number]:30M

## 2022-05-16 NOTE — Plan of Care (Signed)

## 2022-05-16 NOTE — Progress Notes (Signed)
PROGRESS NOTE    Vicki [name]  JKK:[id number] DOB: [birth year] DOA: 05/14/2022 PCP: Deland Pretty, MD    Chief Complaint  Patient presents with   Weakness   Altered Mental Status    Brief Narrative:    Vicki [name] is a 81 y.o. female with medical history significant of severe pulmonary hypertension/moderate MR on chronic nocturnal oxygen at 4 L/min, TTR cardiac amyloidosis with associated HFpEF plushistory of thrombocytopenia, chronic atrial fibrillation on anticoagulation.  Patient presented to the ER after being noted at home with severe lethargy and inability to mobilize effectively.  Her work-up was significant for sepsis due to influenza, worsening renal failure.    Assessment & Plan:    Sepsis physiology secondary to influenza A with secondary bacterial pneumonia - Patient presented with fever, hypotension, altered mental status secondary to influenza A Lactic acid was 1.2 -Continue with Tamiflu -Continue with IV antibiotics   Chronic respiratory failure with hypoxia secondary to pulmonary hypertension - Baseline O2 requirements 4 L/min.   -CHF team input greatly appreciated, continue with home medications Tyvaso, Cialis and Opsumit  Elevated troponin Suspect related to demand ischemia since EKG unremarkable   New left renal mass -This  is a new finding -Further work-up and imaging as an outpatient once renal function has improved   Cardiac amyloidosis/HFpEF Continue preadmission Tafamidis -To resume diuretics tomorrow per CHF team recommendation    AKI Baseline creatinine 1.05-1.49 Presenting creatinine 2.62 Hold diuretics Improving, will hold IV fluids today   HTN Hold Norvasc given suboptimal blood pressure readings secondary to sepsis physiology   Chronic atrial fibrillation on warfarin Currently rate controlled and experiencing unifocal PVCs Continue with warfarin, pharmacy to dose   Abnormal urinalysis Secondary to dehydration Urine culture  pending, she is empirically covered with Rocephin   Obstipation Scheduled Colace  Reports decline in mental status over the last few months, will check Z16, folic acid, TSH and CT head.  DVT prophylaxis: Warfarin Code Status: (Full code Family Communication: Discussed with son at bedside Disposition:   Status is: Inpatient    Consultants:  - cardiology  Subjective:  Reports she is feeling better today, she still reports some dyspnea and cough Objective: Vitals:   05/15/22 1918 05/15/22 2200 05/15/22 2353 05/16/22 0500  BP:  124/66 (!) 105/49   Pulse:  96 80   Resp:  18 20   Temp: 99.2 F (37.3 C) 98.6 F (37 C) 98.7 F (37.1 C)   TempSrc: Oral Oral Oral   SpO2:      Weight:    61.4 kg  Height:        Intake/Output Summary (Last 24 hours) at 05/16/2022 1113 Last data filed at 05/15/2022 1523 Gross per 24 hour  Intake 395.2 ml  Output --  Net 395.2 ml   Filed Weights   05/15/22 0005 05/16/22 0500  Weight: 60.3 kg 61.4 kg    Examination:  Awake Alert, Oriented X 3, No new F.N deficits, Normal affect Symmetrical Chest wall movement, Good air movement bilaterally, CTAB RRR,No Gallops,Rubs ,murmur + +ve B.Sounds, Abd Soft, No tenderness, No rebound - guarding or rigidity. No Cyanosis, Clubbing +1 edema     Data Reviewed: I have personally reviewed following labs and imaging studies  CBC: Recent Labs  Lab 05/15/22 0023 05/15/22 1315 05/16/22 0217  WBC 8.1 6.1 5.6  NEUTROABS 5.6  --   --   HGB 9.6* 9.5* 8.8*  HCT 31.0* 31.3* 27.8*  MCV 97.8 99.7 96.2  PLT 211  204 595    Basic Metabolic Panel: Recent Labs  Lab 05/15/22 0023 05/15/22 0855 05/16/22 0217  NA 138 139 140  K 4.1 3.6 3.7  CL 103 107 107  CO2 _0 GLUCOSE 104* 95 99  BUN 27* 25* 26*  CREATININE 2.62* 2.32* 2.00*  CALCIUM 9.2 8.4* 8.5*    GFR: Estimated Creatinine Clearance: 19 mL/min (A) (by C-G formula based on SCr of 2 mg/dL (H)).  Liver Function Tests: Recent  Labs  Lab 05/15/22 0023 05/15/22 0855  AST 29 25  ALT 11 8  ALKPHOS 34* 28*  BILITOT 0.6 0.5  PROT 7.2 5.6*  ALBUMIN 2.9* 2.2*    CBG: Recent Labs  Lab 05/15/22 0037  GLUCAP 89     Recent Results (from the past 240 hour(s))  Blood culture (routine x 2)     Status: None (Preliminary result)   Collection Time: 05/15/22  1:14 AM   Specimen: BLOOD  Result Value Ref Range Status   Specimen Description BLOOD RIGHT ANTECUBITAL  Final   Special Requests   Final    BOTTLES DRAWN AEROBIC AND ANAEROBIC Blood Culture adequate volume   Culture   Final    NO GROWTH 1 DAY Performed at [facility], [place] [address] [place], [place] [address]    Report Status PENDING  Incomplete  Blood culture (routine x 2)     Status: None (Preliminary result)   Collection Time: 05/15/22  1:19 AM   Specimen: BLOOD  Result Value Ref Range Status   Specimen Description BLOOD BLOOD RIGHT WRIST  Final   Special Requests   Final    BOTTLES DRAWN AEROBIC ONLY Blood Culture results may not be optimal due to an inadequate volume of blood received in culture bottles   Culture   Final    NO GROWTH 1 DAY Performed at [facility], [place] [address] [place], [place] [address]    Report Status PENDING  Incomplete  Resp Panel by RT-PCR (Flu A&B, Covid) Anterior Nasal Swab     Status: Abnormal   Collection Time: 05/15/22  1:25 AM   Specimen: Anterior Nasal Swab  Result Value Ref Range Status   SARS Coronavirus 2 by RT PCR NEGATIVE NEGATIVE Final    Comment: (NOTE) SARS-CoV-2 target nucleic acids are NOT DETECTED.  The SARS-CoV-2 RNA is generally detectable in upper respiratory specimens during the acute phase of infection. The lowest concentration of SARS-CoV-2 viral copies this assay can detect is 138 copies/mL. A negative result does not preclude SARS-Cov-2 infection and should not be used as the sole basis for treatment or other patient management decisions. A negative result may occur with   improper specimen collection/handling, submission of specimen other than nasopharyngeal swab, presence of viral mutation(s) within the areas targeted by this assay, and inadequate number of viral copies(<138 copies/mL). A negative result must be combined with clinical observations, patient history, and epidemiological information. The expected result is Negative.  Fact Sheet for Patients:  EntrepreneurPulse.com.au  Fact Sheet for Healthcare Providers:  IncredibleEmployment.be  This test is no t yet approved or cleared by the [place] FDA and  has been authorized for detection and/or diagnosis of SARS-CoV-2 by FDA under an Emergency Use Authorization (EUA). This EUA will remain  in effect (meaning this test can be used) for the duration of the COVID-19 declaration under Section 564(b)(1) of the Act, 21 U.S.C.section 360bbb-3(b)(1), unless the authorization is terminated  or revoked sooner.  Influenza A by PCR POSITIVE (A) NEGATIVE Final   Influenza B by PCR NEGATIVE NEGATIVE Final    Comment: (NOTE) The Xpert Xpress SARS-CoV-2/FLU/RSV plus assay is intended as an aid in the diagnosis of influenza from Nasopharyngeal swab specimens and should not be used as a sole basis for treatment. Nasal washings and aspirates are unacceptable for Xpert Xpress SARS-CoV-2/FLU/RSV testing.  Fact Sheet for Patients: EntrepreneurPulse.com.au  Fact Sheet for Healthcare Providers: IncredibleEmployment.be  This test is not yet approved or cleared by the [place] FDA and has been authorized for detection and/or diagnosis of SARS-CoV-2 by FDA under an Emergency Use Authorization (EUA). This EUA will remain in effect (meaning this test can be used) for the duration of the COVID-19 declaration under Section 564(b)(1) of the Act, 21 U.S.C. section 360bbb-3(b)(1), unless the authorization is terminated  or revoked.  Performed at [facility], [place] [address], [place],  [address]          Radiology Studies: CT HEAD WO CONTRAST (5MM)  Result Date: 05/16/2022 CLINICAL DATA:  Altered mental status EXAM: CT HEAD WITHOUT CONTRAST TECHNIQUE: Contiguous axial images were obtained from the base of the skull through the vertex without intravenous contrast. RADIATION DOSE REDUCTION: This exam was performed according to the departmental dose-optimization program which includes automated exposure control, adjustment of the mA and/or kV according to patient size and/or use of iterative reconstruction technique. COMPARISON:  07/03/2016 FINDINGS: Brain: No evidence of acute infarction, hemorrhage, hydrocephalus, extra-axial collection or mass lesion/mass effect. Mild cerebral volume loss and white matter low-density for age. Vascular: No hyperdense vessel or unexpected calcification. Skull: Normal. Negative for fracture or focal lesion. Sinuses/Orbits: No acute finding. IMPRESSION: Aging brain without acute or reversible finding. Electronically Signed   By: Jorje Guild M.D.   On: 05/16/2022 10:57   CT Renal Stone Study  Result Date: 05/15/2022 CLINICAL DATA:  Abdominal/flank pain, stone suspected. EXAM: CT ABDOMEN AND PELVIS WITHOUT CONTRAST TECHNIQUE: Multidetector CT imaging of the abdomen and pelvis was performed following the standard protocol without IV contrast. RADIATION DOSE REDUCTION: This exam was performed according to the departmental dose-optimization program which includes automated exposure control, adjustment of the mA and/or kV according to patient size and/or use of iterative reconstruction technique. COMPARISON:  None Available. FINDINGS: Lower chest: Nodular infiltrate within the a visualized left lung base may be related to infection or aspiration. No pleural effusion. Moderate cardiomegaly noted. Small pericardial effusion. Hepatobiliary: Cholelithiasis noted without  pericholecystic inflammatory change identified. Liver unremarkable. No intra or extrahepatic biliary ductal dilation. Pancreas: Unremarkable Spleen: Unremarkable Adrenals/Urinary Tract: The adrenal glands are unremarkable. The kidneys are normal in size and position. A a exophytic heterogeneously hyperdense mass arises from the lower pole of the left kidney measuring 8.6 x 9.6 x 0.5 cm in greatest dimension which is not well characterized on this noncontrast examination. This may represent a hemorrhagic neoplasm or, less likely, a complex cyst with areas of recurrent intracystic hemorrhage. No hydronephrosis. No intrarenal or ureteral calculi. The bladder is unremarkable. Stomach/Bowel: The stomach, small bowel, and large bowel are unremarkable save for moderate colonic stool burden. No evidence of obstruction or focal inflammation. No free intraperitoneal gas or fluid. Vascular/Lymphatic: Aortic atherosclerosis. No enlarged abdominal or pelvic lymph nodes. Reproductive: Status post hysterectomy. No adnexal masses. Other: Small fat containing right superior lumbar hernia. Musculoskeletal: No acute bone abnormality. No lytic or blastic bone lesion. Degenerative changes noted in the lumbar spine at L4-5 with associated grade 1 anterolisthesis. IMPRESSION:  1. 9.6 cm exophytic heterogeneously hyperdense mass arising from the lower pole of the left kidney, not well characterized on this noncontrast examination. This may represent a hemorrhagic neoplasm or, less likely, a complex cyst with areas of recurrent intracystic hemorrhage. Dedicated renal mass protocol CT or MRI examination is recommended for further evaluation. 2. Nodular infiltrate within the visualized left lung base, possibly related to infection or aspiration. 3. Moderate cardiomegaly. Small pericardial effusion. 4. Cholelithiasis. 5. Moderate colonic stool burden. 6. Aortic atherosclerosis. Aortic Atherosclerosis (ICD10-I70.0). Electronically Signed   By:  Fidela Salisbury M.D.   On: 05/15/2022 04:37   DG Chest 2 View  Result Date: 05/15/2022 CLINICAL DATA:  Coughing and weakness. EXAM: CHEST - 2 VIEW COMPARISON:  PA Lat 08/21/2013. FINDINGS: Severe global cardiomegaly, increased. There is prominence of the central pulmonary arteries and veins, mild interstitial edema in the lower lung fields with trace pleural effusions. Slight thickening of the horizontal fissure on the right is again shown, unchanged There are bilateral infrahilar hazy opacities which could be due to atelectasis, ground-glass edema or pneumonitis. The remaining lungs are generally clear. There is aortic tortuosity and calcification with stable mediastinum. Thoracic spondylosis and mild thoracic kyphosis are again shown. Mild osteopenia. IMPRESSION: 1. Severe cardiomegaly, increased. 2. Interstitial edema in the lower lung fields with trace pleural effusions. 3. Bilateral infrahilar hazy opacities which could be due to atelectasis, ground-glass edema or pneumonitis. 4. Aortic atherosclerosis and tortuosity. Electronically Signed   By: Telford Nab M.D.   On: 05/15/2022 01:09        Scheduled Meds:  busPIRone  5 mg Oral q morning   cholecalciferol  1,000 Units Oral Daily   docusate sodium  100 mg Oral BID   macitentan  10 mg Oral Daily   oseltamivir  30 mg Oral Daily   rosuvastatin  20 mg Oral QHS   tadalafil  20 mg Oral Daily   Tafamidis  1 capsule Oral Daily   [START ON 05/17/2022] torsemide  30 mg Oral BID   Treprostinil  54 mcg Inhalation QID   Continuous Infusions:  azithromycin 500 mg (05/15/22 2300)   cefTRIAXone (ROCEPHIN)  IV 1 g (05/16/22 0000)   dextrose 5 % and 0.9% NaCl Stopped (05/15/22 1523)     LOS: 1 day    Phillips Climes, MD [facility]   To contact the attending provider between 7A-7P or the covering provider during after hours 7P-7A, please log into the web site [email or web address] and access using universal [place] password for that web  site. If you do not have the password, please call the hospital operator.  05/16/2022, 11:13 AM

## 2022-05-16 NOTE — Progress Notes (Signed)
Rounding Note    Patient Name: Vicki [name] Date of Encounter: 05/16/2022  [place] Cardiologist: None  Subjective   NAEO. Family at bedside. Feels better.  Inpatient Medications    Scheduled Meds:  busPIRone  5 mg Oral q morning   cholecalciferol  1,000 Units Oral Daily   docusate sodium  100 mg Oral BID   macitentan  10 mg Oral Daily   oseltamivir  30 mg Oral Daily   rosuvastatin  20 mg Oral QHS   tadalafil  20 mg Oral Daily   Tafamidis  1 capsule Oral Daily   Treprostinil  54 mcg Inhalation QID   Continuous Infusions:  azithromycin 500 mg (05/15/22 2300)   cefTRIAXone (ROCEPHIN)  IV 1 g (05/16/22 0000)   dextrose 5 % and 0.9% NaCl Stopped (05/15/22 1523)   PRN Meds:    Vital Signs    Vitals:   05/15/22 1918 05/15/22 2200 05/15/22 2353 05/16/22 0500  BP:  124/66 (!) 105/49   Pulse:  96 80   Resp:  18 20   Temp: 99.2 F (37.3 C) 98.6 F (37 C) 98.7 F (37.1 C)   TempSrc: Oral Oral Oral   SpO2:      Weight:    61.4 kg  Height:        Intake/Output Summary (Last 24 hours) at 05/16/2022 1013 Last data filed at 05/15/2022 1523 Gross per 24 hour  Intake 395.2 ml  Output --  Net 395.2 ml      05/16/2022    5:00 AM 05/15/2022   12:05 AM 03/31/2022    1:38 PM  Last 3 Weights  Weight (lbs) 135 lb 5.8 oz 133 lb 133 lb 6.4 oz  Weight (kg) 61.4 kg 60.328 kg 60.51 kg      Telemetry    Personally Reviewed  ECG    Personally Reviewed  Physical Exam   GEN: No acute distress.   Neck: No JVD Cardiac: irregularly irregular, III/VI systolic murmur, rubs, or gallops.  Respiratory: Clear to auscultation bilaterally. GI: Soft, nontender, non-distended  MS: No edema; No deformity. Neuro:  Nonfocal  Psych: Normal affect   Labs    High Sensitivity Troponin:   Recent Labs  Lab 05/15/22 0023 05/15/22 0231  TROPONINIHS 83* 98*     Chemistry Recent Labs  Lab 05/15/22 0023 05/15/22 0855 05/16/22 0217  NA 138 139 140  K 4.1 3.6  3.7  CL 103 107 107  CO2 _0 GLUCOSE 104* 95 99  BUN 27* 25* 26*  CREATININE 2.62* 2.32* 2.00*  CALCIUM 9.2 8.4* 8.5*  PROT 7.2 5.6*  --   ALBUMIN 2.9* 2.2*  --   AST 29 25  --   ALT 11 8  --   ALKPHOS 34* 28*  --   BILITOT 0.6 0.5  --   GFRNONAA 18* 21* 25*  ANIONGAP _1 Lipids No results for input(s): "CHOL", "TRIG", "HDL", "LABVLDL", "LDLCALC", "CHOLHDL" in the last 168 hours.  Hematology Recent Labs  Lab 05/15/22 0023 05/15/22 1315 05/16/22 0217  WBC 8.1 6.1 5.6  RBC 3.17* 3.14* 2.89*  HGB 9.6* 9.5* 8.8*  HCT 31.0* 31.3* 27.8*  MCV 97.8 99.7 96.2  MCH 30.3 30.3 30.4  MCHC 31.0 30.4 31.7  RDW 15.4 15.4 15.4  PLT 211 204 206   Thyroid No results for input(s): "TSH", "FREET4" in the last 168 hours.  BNP Recent Labs  Lab 05/15/22 0023  BNP  767.4*    DDimer No results for input(s): "DDIMER" in the last 168 hours.   Radiology    CT Renal Stone Study  Result Date: 05/15/2022 CLINICAL DATA:  Abdominal/flank pain, stone suspected. EXAM: CT ABDOMEN AND PELVIS WITHOUT CONTRAST TECHNIQUE: Multidetector CT imaging of the abdomen and pelvis was performed following the standard protocol without IV contrast. RADIATION DOSE REDUCTION: This exam was performed according to the departmental dose-optimization program which includes automated exposure control, adjustment of the mA and/or kV according to patient size and/or use of iterative reconstruction technique. COMPARISON:  None Available. FINDINGS: Lower chest: Nodular infiltrate within the a visualized left lung base may be related to infection or aspiration. No pleural effusion. Moderate cardiomegaly noted. Small pericardial effusion. Hepatobiliary: Cholelithiasis noted without pericholecystic inflammatory change identified. Liver unremarkable. No intra or extrahepatic biliary ductal dilation. Pancreas: Unremarkable Spleen: Unremarkable Adrenals/Urinary Tract: The adrenal glands are unremarkable. The kidneys are  normal in size and position. A a exophytic heterogeneously hyperdense mass arises from the lower pole of the left kidney measuring 8.6 x 9.6 x 0.5 cm in greatest dimension which is not well characterized on this noncontrast examination. This may represent a hemorrhagic neoplasm or, less likely, a complex cyst with areas of recurrent intracystic hemorrhage. No hydronephrosis. No intrarenal or ureteral calculi. The bladder is unremarkable. Stomach/Bowel: The stomach, small bowel, and large bowel are unremarkable save for moderate colonic stool burden. No evidence of obstruction or focal inflammation. No free intraperitoneal gas or fluid. Vascular/Lymphatic: Aortic atherosclerosis. No enlarged abdominal or pelvic lymph nodes. Reproductive: Status post hysterectomy. No adnexal masses. Other: Small fat containing right superior lumbar hernia. Musculoskeletal: No acute bone abnormality. No lytic or blastic bone lesion. Degenerative changes noted in the lumbar spine at L4-5 with associated grade 1 anterolisthesis. IMPRESSION: 1. 9.6 cm exophytic heterogeneously hyperdense mass arising from the lower pole of the left kidney, not well characterized on this noncontrast examination. This may represent a hemorrhagic neoplasm or, less likely, a complex cyst with areas of recurrent intracystic hemorrhage. Dedicated renal mass protocol CT or MRI examination is recommended for further evaluation. 2. Nodular infiltrate within the visualized left lung base, possibly related to infection or aspiration. 3. Moderate cardiomegaly. Small pericardial effusion. 4. Cholelithiasis. 5. Moderate colonic stool burden. 6. Aortic atherosclerosis. Aortic Atherosclerosis (ICD10-I70.0). Electronically Signed   By: Fidela Salisbury M.D.   On: 05/15/2022 04:37   DG Chest 2 View  Result Date: 05/15/2022 CLINICAL DATA:  Coughing and weakness. EXAM: CHEST - 2 VIEW COMPARISON:  PA Lat 08/21/2013. FINDINGS: Severe global cardiomegaly, increased. There is  prominence of the central pulmonary arteries and veins, mild interstitial edema in the lower lung fields with trace pleural effusions. Slight thickening of the horizontal fissure on the right is again shown, unchanged There are bilateral infrahilar hazy opacities which could be due to atelectasis, ground-glass edema or pneumonitis. The remaining lungs are generally clear. There is aortic tortuosity and calcification with stable mediastinum. Thoracic spondylosis and mild thoracic kyphosis are again shown. Mild osteopenia. IMPRESSION: 1. Severe cardiomegaly, increased. 2. Interstitial edema in the lower lung fields with trace pleural effusions. 3. Bilateral infrahilar hazy opacities which could be due to atelectasis, ground-glass edema or pneumonitis. 4. Aortic atherosclerosis and tortuosity. Electronically Signed   By: Telford Nab M.D.   On: 05/15/2022 01:09      Assessment & Plan   #TTR Amyloid Cont tafamadis Restart diuretics tomorrow AM  #PAH Cont macitentan, tvyaso, adcirca  #flu Medicine treating  #AF Chronic  INR still supratherapeutic. Goal INR 2-3  For questions or updates, please contact [place] Please consult [email or web address] for contact info under        Signed, Vickie Epley, MD  05/16/2022, 10:13 AM

## 2022-05-17 DIAGNOSIS — I4891 Unspecified atrial fibrillation: Secondary | ICD-10-CM | POA: Diagnosis not present

## 2022-05-17 DIAGNOSIS — N179 Acute kidney failure, unspecified: Secondary | ICD-10-CM | POA: Diagnosis not present

## 2022-05-17 DIAGNOSIS — N2889 Other specified disorders of kidney and ureter: Secondary | ICD-10-CM

## 2022-05-17 DIAGNOSIS — J09X1 Influenza due to identified novel influenza A virus with pneumonia: Secondary | ICD-10-CM | POA: Diagnosis not present

## 2022-05-17 LAB — PHOSPHORUS: Phosphorus: 2.6 mg/dL (ref 2.5–4.6)

## 2022-05-17 LAB — BASIC METABOLIC PANEL
Anion gap: 10 (ref 5–15)
BUN: 21 mg/dL (ref 8–23)
CO2: 23 mmol/L (ref 22–32)
Calcium: 8.3 mg/dL — ABNORMAL LOW (ref 8.9–10.3)
Chloride: 107 mmol/L (ref 98–111)
Creatinine, Ser: 1.56 mg/dL — ABNORMAL HIGH (ref 0.44–1.00)
GFR, Estimated: 33 mL/min — ABNORMAL LOW (ref >60)
Glucose, Bld: 83 mg/dL (ref 70–99)
Potassium: 3 mmol/L — ABNORMAL LOW (ref 3.5–5.1)
Sodium: 140 mmol/L (ref 135–145)

## 2022-05-17 LAB — PROCALCITONIN: Procalcitonin: 0.71 ng/mL

## 2022-05-17 LAB — CBC
HCT: 27.5 % — ABNORMAL LOW (ref 36.0–46.0)
Hemoglobin: 8.5 g/dL — ABNORMAL LOW (ref 12.0–15.0)
MCH: 29.4 pg (ref 26.0–34.0)
MCHC: 30.9 g/dL (ref 30.0–36.0)
MCV: 95.2 fL (ref 80.0–100.0)
Platelets: 213 10*3/uL (ref 150–400)
RBC: 2.89 MIL/uL — ABNORMAL LOW (ref 3.87–5.11)
RDW: 15.4 % (ref 11.5–15.5)
WBC: 5 10*3/uL (ref 4.0–10.5)
nRBC: 0 % (ref 0.0–0.2)

## 2022-05-17 LAB — VITAMIN B12: Vitamin B-12: 269 pg/mL (ref 180–914)

## 2022-05-17 LAB — PROTIME-INR
INR: 2.8 — ABNORMAL HIGH (ref 0.8–1.2)
Prothrombin Time: 29 seconds — ABNORMAL HIGH (ref 11.4–15.2)

## 2022-05-17 MED ORDER — K PHOS MONO-SOD PHOS DI & MONO 155-852-130 MG PO TABS
500.0000 mg | ORAL_TABLET | Freq: Two times a day (BID) | ORAL | Status: AC
  Administered 2022-05-17 (×2): 500 mg via ORAL
  Filled 2022-05-17 (×2): qty 2

## 2022-05-17 MED ORDER — POTASSIUM CHLORIDE CRYS ER 20 MEQ PO TBCR
40.0000 meq | EXTENDED_RELEASE_TABLET | Freq: Four times a day (QID) | ORAL | Status: AC
  Administered 2022-05-17 (×2): 40 meq via ORAL
  Filled 2022-05-17 (×2): qty 2

## 2022-05-17 MED ORDER — WARFARIN SODIUM 2.5 MG PO TABS
2.5000 mg | ORAL_TABLET | Freq: Once | ORAL | Status: AC
  Administered 2022-05-17: 2.5 mg via ORAL
  Filled 2022-05-17: qty 1

## 2022-05-17 MED ORDER — WARFARIN - PHARMACIST DOSING INPATIENT: Freq: Every day | Status: DC

## 2022-05-17 MED ORDER — CYANOCOBALAMIN 1000 MCG/ML IJ SOLN
1000.0000 ug | Freq: Every day | INTRAMUSCULAR | Status: DC
  Administered 2022-05-17 – 2022-05-19 (×3): 1000 ug via INTRAMUSCULAR
  Filled 2022-05-17 (×3): qty 1

## 2022-05-17 NOTE — Progress Notes (Signed)
PROGRESS NOTE    Vicki [name]  IWO:[id number] DOB: Oct 28, 1940 DOA: 05/14/2022 PCP: Deland Pretty, MD    Chief Complaint  Patient presents with   Weakness   Altered Mental Status    Brief Narrative:    Vicki [name] is a 81 y.o. female with medical history significant of severe pulmonary hypertension/moderate MR on chronic nocturnal oxygen at 4 L/min, TTR cardiac amyloidosis with associated HFpEF plushistory of thrombocytopenia, chronic atrial fibrillation on anticoagulation.  Patient presented to the ER after being noted at home with severe lethargy and inability to mobilize effectively.  Her work-up was significant for sepsis due to influenza, worsening renal failure.    Assessment & Plan:    Sepsis physiology secondary to influenza A with secondary bacterial pneumonia - Patient presented with fever, hypotension, altered mental status secondary to influenza A Lactic acid was 1.2 -Continue with Tamiflu -Continue with IV antibiotics   Chronic respiratory failure with hypoxia secondary to pulmonary hypertension - Baseline O2 requirements 4 L/min.   -CHF team input greatly appreciated, continue with home medications Tyvaso, Cialis and Opsumit  Elevated troponin Suspect related to demand ischemia since EKG unremarkable  Hypokalemia -Repleted   New left renal mass -This  is a new finding -Urology has been consulted, will need further imaging, with final recommendation by urology.   Cardiac amyloidosis/HFpEF Continue preadmission Tafamidis -To resume diuretics tomorrow per CHF team recommendation    AKI Baseline creatinine 1.05-1.49 Presenting creatinine 2.62 Hold diuretics Improving, hold on resuming diuresis.   HTN Hold Norvasc given suboptimal blood pressure readings secondary to sepsis physiology   Chronic atrial fibrillation on warfarin Currently rate controlled and experiencing unifocal PVCs Continue with warfarin, pharmacy to dose   Abnormal  urinalysis Secondary to dehydration Urine culture pending, she is empirically covered with Rocephin   Obstipation Scheduled Colace  Reports decline in mental status over the last few months, B12 is borderline, will start on supplements, CT head with no acute findings.     DVT prophylaxis: Warfarin Code Status: Full code Family Communication: Discussed with son at bedside Disposition:   Status is: Inpatient    Consultants:  - cardiology Urology  Subjective:  Cough and dyspnea has significantly improved Objective: Vitals:   05/16/22 2012 05/16/22 2300 05/17/22 0317 05/17/22 0847  BP: 122/60 116/64 111/61 118/74  Pulse: 85 90 90 84  Resp: _0 Temp: 98.6 F (37 C) 98.7 F (37.1 C) 98.6 F (37 C)   TempSrc: Oral Oral Oral   SpO2:      Weight:      Height:        Intake/Output Summary (Last 24 hours) at 05/17/2022 1118 Last data filed at 05/17/2022 5003 Gross per 24 hour  Intake 200 ml  Output --  Net 200 ml   Filed Weights   05/15/22 0005 05/16/22 0500  Weight: 60.3 kg 61.4 kg    Examination:   Awake Alert, Oriented X 3, frail, no apparent distress Symmetrical Chest wall movement, Good air movement bilaterally, CTAB RRR,No Gallops,Rubs,+ murmur +ve B.Sounds, Abd Soft, No tenderness, No rebound - guarding or rigidity. No Cyanosis, Clubbing or edema, No new Rash or bruise       Data Reviewed: I have personally reviewed following labs and imaging studies  CBC: Recent Labs  Lab 05/15/22 0023 05/15/22 1315 05/16/22 0217 05/17/22 0342  WBC 8.1 6.1 5.6 5.0  NEUTROABS 5.6  --   --   --   HGB 9.6* 9.5* 8.8*  8.5*  HCT 31.0* 31.3* 27.8* 27.5*  MCV 97.8 99.7 96.2 95.2  PLT 211 204 206 097    Basic Metabolic Panel: Recent Labs  Lab 05/15/22 0023 05/15/22 0855 05/16/22 0217 05/17/22 0342  NA 138 139 140 140  K 4.1 3.6 3.7 3.0*  CL 103 107 107 107  CO2 _0 GLUCOSE 104* 95 99 83  BUN 27* 25* 26* 21  CREATININE 2.62* 2.32* 2.00*  1.56*  CALCIUM 9.2 8.4* 8.5* 8.3*  PHOS  --   --   --  2.6    GFR: Estimated Creatinine Clearance: 24.4 mL/min (A) (by C-G formula based on SCr of 1.56 mg/dL (H)).  Liver Function Tests: Recent Labs  Lab 05/15/22 0023 05/15/22 0855  AST 29 25  ALT 11 8  ALKPHOS 34* 28*  BILITOT 0.6 0.5  PROT 7.2 5.6*  ALBUMIN 2.9* 2.2*    CBG: Recent Labs  Lab 05/15/22 0037  GLUCAP 89     Recent Results (from the past 240 hour(s))  Blood culture (routine x 2)     Status: None (Preliminary result)   Collection Time: 05/15/22  1:14 AM   Specimen: BLOOD  Result Value Ref Range Status   Specimen Description BLOOD RIGHT ANTECUBITAL  Final   Special Requests   Final    BOTTLES DRAWN AEROBIC AND ANAEROBIC Blood Culture adequate volume   Culture   Final    NO GROWTH 2 DAYS Performed at [facility], [place] [address], [place], [place] [address]    Report Status PENDING  Incomplete  Blood culture (routine x 2)     Status: None (Preliminary result)   Collection Time: 05/15/22  1:19 AM   Specimen: BLOOD  Result Value Ref Range Status   Specimen Description BLOOD BLOOD RIGHT WRIST  Final   Special Requests   Final    BOTTLES DRAWN AEROBIC ONLY Blood Culture results may not be optimal due to an inadequate volume of blood received in culture bottles   Culture   Final    NO GROWTH 2 DAYS Performed at [facility], [place] [address] [place], [place] [address]    Report Status PENDING  Incomplete  Resp Panel by RT-PCR (Flu A&B, Covid) Anterior Nasal Swab     Status: Abnormal   Collection Time: 05/15/22  1:25 AM   Specimen: Anterior Nasal Swab  Result Value Ref Range Status   SARS Coronavirus 2 by RT PCR NEGATIVE NEGATIVE Final    Comment: (NOTE) SARS-CoV-2 target nucleic acids are NOT DETECTED.  The SARS-CoV-2 RNA is generally detectable in upper respiratory specimens during the acute phase of infection. The lowest concentration of SARS-CoV-2 viral copies this assay can detect  is 138 copies/mL. A negative result does not preclude SARS-Cov-2 infection and should not be used as the sole basis for treatment or other patient management decisions. A negative result may occur with  improper specimen collection/handling, submission of specimen other than nasopharyngeal swab, presence of viral mutation(s) within the areas targeted by this assay, and inadequate number of viral copies(<138 copies/mL). A negative result must be combined with clinical observations, patient history, and epidemiological information. The expected result is Negative.  Fact Sheet for Patients:  EntrepreneurPulse.com.au  Fact Sheet for Healthcare Providers:  IncredibleEmployment.be  This test is no t yet approved or cleared by the [place] FDA and  has been authorized for detection and/or diagnosis of SARS-CoV-2 by FDA under an Emergency Use Authorization (EUA). This  EUA will remain  in effect (meaning this test can be used) for the duration of the COVID-19 declaration under Section 564(b)(1) of the Act, 21 U.S.C.section 360bbb-3(b)(1), unless the authorization is terminated  or revoked sooner.       Influenza A by PCR POSITIVE (A) NEGATIVE Final   Influenza B by PCR NEGATIVE NEGATIVE Final    Comment: (NOTE) The Xpert Xpress SARS-CoV-2/FLU/RSV plus assay is intended as an aid in the diagnosis of influenza from Nasopharyngeal swab specimens and should not be used as a sole basis for treatment. Nasal washings and aspirates are unacceptable for Xpert Xpress SARS-CoV-2/FLU/RSV testing.  Fact Sheet for Patients: EntrepreneurPulse.com.au  Fact Sheet for Healthcare Providers: IncredibleEmployment.be  This test is not yet approved or cleared by the [place] FDA and has been authorized for detection and/or diagnosis of SARS-CoV-2 by FDA under an Emergency Use Authorization (EUA). This EUA will remain in  effect (meaning this test can be used) for the duration of the COVID-19 declaration under Section 564(b)(1) of the Act, 21 U.S.C. section 360bbb-3(b)(1), unless the authorization is terminated or revoked.  Performed at [facility], [place] [address], [place], [place] [address]   Urine Culture     Status: Abnormal   Collection Time: 05/15/22  2:32 AM   Specimen: Urine, Clean Catch  Result Value Ref Range Status   Specimen Description URINE, CLEAN CATCH  Final   Special Requests NONE  Final   Culture (A)  Final    <10,000 COLONIES/mL INSIGNIFICANT GROWTH Performed at [facility], [place] [address] [place], [place] [address]    Report Status 05/16/2022 FINAL  Final         Radiology Studies: CT HEAD WO CONTRAST (5MM)  Result Date: 05/16/2022 CLINICAL DATA:  Altered mental status EXAM: CT HEAD WITHOUT CONTRAST TECHNIQUE: Contiguous axial images were obtained from the base of the skull through the vertex without intravenous contrast. RADIATION DOSE REDUCTION: This exam was performed according to the departmental dose-optimization program which includes automated exposure control, adjustment of the mA and/or kV according to patient size and/or use of iterative reconstruction technique. COMPARISON:  07/03/2016 FINDINGS: Brain: No evidence of acute infarction, hemorrhage, hydrocephalus, extra-axial collection or mass lesion/mass effect. Mild cerebral volume loss and white matter low-density for age. Vascular: No hyperdense vessel or unexpected calcification. Skull: Normal. Negative for fracture or focal lesion. Sinuses/Orbits: No acute finding. IMPRESSION: Aging brain without acute or reversible finding. Electronically Signed   By: Jorje Guild M.D.   On: 05/16/2022 10:57        Scheduled Meds:  busPIRone  5 mg Oral q morning   cholecalciferol  1,000 Units Oral Daily   cyanocobalamin  1,000 mcg Intramuscular Daily   docusate sodium  100 mg Oral BID   macitentan  10 mg  Oral Daily   oseltamivir  30 mg Oral Daily   phosphorus  500 mg Oral BID   potassium chloride  40 mEq Oral Q6H   rosuvastatin  20 mg Oral QHS   tadalafil  20 mg Oral Daily   Tafamidis  1 capsule Oral Daily   torsemide  30 mg Oral BID   Treprostinil  54 mcg Inhalation QID   Continuous Infusions:  azithromycin 500 mg (05/16/22 2345)   cefTRIAXone (ROCEPHIN)  IV Stopped (05/16/22 2231)     LOS: 2 days    Phillips Climes, MD [facility]   To contact the attending provider between 7A-7P or the covering provider during after  hours 7P-7A, please log into the web site [email or web address] and access using universal [place] password for that web site. If you do not have the password, please call the hospital operator.  05/17/2022, 11:18 AM

## 2022-05-17 NOTE — Progress Notes (Signed)
ANTICOAGULATION CONSULT NOTE - Follow Up Consult  Pharmacy Consult for warfarin Indication: atrial fibrillation  No Known Allergies  Patient Measurements: Height: _0  (157.5 cm) Weight: 61.4 kg (135 lb 5.8 oz) IBW/kg (Calculated) : 50.1  Vital Signs: Temp: 98.6 F (37 C) (11/19 0317) Temp Source: Oral (11/19 0317) BP: 118/74 (11/19 0847) Pulse Rate: 84 (11/19 0847)  Labs: Recent Labs    05/15/22 0023 05/15/22 0137 05/15/22 0231 05/15/22 0855 05/15/22 1315 05/16/22 0217 05/17/22 0342  HGB 9.6*  --   --   --  9.5* 8.8* 8.5*  HCT 31.0*  --   --   --  31.3* 27.8* 27.5*  PLT 211  --   --   --  204 206 213  LABPROT  --  31.7*  --   --   --  32.7* 29.0*  INR  --  3.1*  --   --   --  3.2* 2.8*  CREATININE 2.62*  --   --  2.32*  --  2.00* 1.56*  TROPONINIHS 83*  --  98*  --   --   --   --     Estimated Creatinine Clearance: 24.4 mL/min (A) (by C-G formula based on SCr of 1.56 mg/dL (H)).   Medications:  Scheduled:   busPIRone  5 mg Oral q morning   cholecalciferol  1,000 Units Oral Daily   cyanocobalamin  1,000 mcg Intramuscular Daily   docusate sodium  100 mg Oral BID   macitentan  10 mg Oral Daily   oseltamivir  30 mg Oral Daily   phosphorus  500 mg Oral BID   potassium chloride  40 mEq Oral Q6H   rosuvastatin  20 mg Oral QHS   tadalafil  20 mg Oral Daily   Tafamidis  1 capsule Oral Daily   torsemide  30 mg Oral BID   Treprostinil  54 mcg Inhalation QID    Assessment: 81 YO F PMH of chronic atrial fibrillation and is on PTA warfarin. PTA dose is Warfarin 5 mg by mouth every evening except on Mondays 2.5 mg. Yesterday INR was slightly supratherapeutic at 3.2. Last dose of Warfarin was on 11/16. Today (11/19) INR is 2.8 and therapeutic at goal 2-3. Pt taking azithromycin, which may cause INR to increase. No signs of bleeding noted. Hgb trending down today at 8.5. SCr trending down near BL. Plts WNL.  Goal of Therapy:  INR 2-3 Monitor platelets by  anticoagulation protocol: Yes   Plan:  Warfarin 2.5 mg PO tonight x 1 dose  Monitor daily CBC, INR Monitor signs and symptoms of bleeding   [place] Intern 05/17/2022,11:06 AM

## 2022-05-17 NOTE — Progress Notes (Signed)
Rounding Note    Patient Name: Vicki [name] Date of Encounter: 05/17/2022  [place] Cardiologist: None  Subjective   NAEO.  Inpatient Medications    Scheduled Meds:  busPIRone  5 mg Oral q morning   cholecalciferol  1,000 Units Oral Daily   cyanocobalamin  1,000 mcg Intramuscular Daily   docusate sodium  100 mg Oral BID   macitentan  10 mg Oral Daily   oseltamivir  30 mg Oral Daily   phosphorus  500 mg Oral BID   potassium chloride  40 mEq Oral Q6H   rosuvastatin  20 mg Oral QHS   tadalafil  20 mg Oral Daily   Tafamidis  1 capsule Oral Daily   torsemide  30 mg Oral BID   Treprostinil  54 mcg Inhalation QID   Continuous Infusions:  azithromycin 500 mg (05/16/22 2345)   cefTRIAXone (ROCEPHIN)  IV Stopped (05/16/22 2231)   PRN Meds:    Vital Signs    Vitals:   05/16/22 2012 05/16/22 2300 05/17/22 0317 05/17/22 0847  BP: 122/60 116/64 111/61 118/74  Pulse: 85 90 90 84  Resp: _0 Temp: 98.6 F (37 C) 98.7 F (37.1 C) 98.6 F (37 C)   TempSrc: Oral Oral Oral   SpO2:      Weight:      Height:        Intake/Output Summary (Last 24 hours) at 05/17/2022 1004 Last data filed at 05/17/2022 2111 Gross per 24 hour  Intake 200 ml  Output --  Net 200 ml       05/16/2022    5:00 AM 05/15/2022   12:05 AM 03/31/2022    1:38 PM  Last 3 Weights  Weight (lbs) 135 lb 5.8 oz 133 lb 133 lb 6.4 oz  Weight (kg) 61.4 kg 60.328 kg 60.51 kg      Telemetry    Personally Reviewed  ECG    Personally Reviewed  Physical Exam   GEN: No acute distress.   Neck: No JVD Cardiac: irregularly irregular, III/VI systolic murmur, rubs, or gallops.  Respiratory: Clear to auscultation bilaterally. GI: Soft, nontender, non-distended  MS: No edema; No deformity. Neuro:  Nonfocal  Psych: Normal affect   Labs    High Sensitivity Troponin:   Recent Labs  Lab 05/15/22 0023 05/15/22 0231  TROPONINIHS 83* 98*      Chemistry Recent Labs  Lab  05/15/22 0023 05/15/22 0855 05/16/22 0217 05/17/22 0342  NA 138 139 140 140  K 4.1 3.6 3.7 3.0*  CL 103 107 107 107  CO2 _1 GLUCOSE 104* 95 99 83  BUN 27* 25* 26* 21  CREATININE 2.62* 2.32* 2.00* 1.56*  CALCIUM 9.2 8.4* 8.5* 8.3*  PROT 7.2 5.6*  --   --   ALBUMIN 2.9* 2.2*  --   --   AST 29 25  --   --   ALT 11 8  --   --   ALKPHOS 34* 28*  --   --   BILITOT 0.6 0.5  --   --   GFRNONAA 18* 21* 25* 33*  ANIONGAP _2 Lipids No results for input(s): "CHOL", "TRIG", "HDL", "LABVLDL", "LDLCALC", "CHOLHDL" in the last 168 hours.  Hematology Recent Labs  Lab 05/15/22 1315 05/16/22 0217 05/17/22 0342  WBC 6.1 5.6 5.0  RBC 3.14* 2.89* 2.89*  HGB 9.5* 8.8* 8.5*  HCT 31.3* 27.8* 27.5*  MCV  99.7 96.2 95.2  MCH 30.3 30.4 29.4  MCHC 30.4 31.7 30.9  RDW 15.4 15.4 15.4  PLT 204 206 213    Thyroid  Recent Labs  Lab 05/16/22 0216  TSH 0.814    BNP Recent Labs  Lab 05/15/22 0023  BNP 767.4*     DDimer No results for input(s): "DDIMER" in the last 168 hours.   Radiology    CT HEAD WO CONTRAST (5MM)  Result Date: 05/16/2022 CLINICAL DATA:  Altered mental status EXAM: CT HEAD WITHOUT CONTRAST TECHNIQUE: Contiguous axial images were obtained from the base of the skull through the vertex without intravenous contrast. RADIATION DOSE REDUCTION: This exam was performed according to the departmental dose-optimization program which includes automated exposure control, adjustment of the mA and/or kV according to patient size and/or use of iterative reconstruction technique. COMPARISON:  07/03/2016 FINDINGS: Brain: No evidence of acute infarction, hemorrhage, hydrocephalus, extra-axial collection or mass lesion/mass effect. Mild cerebral volume loss and white matter low-density for age. Vascular: No hyperdense vessel or unexpected calcification. Skull: Normal. Negative for fracture or focal lesion. Sinuses/Orbits: No acute finding. IMPRESSION: Aging brain without  acute or reversible finding. Electronically Signed   By: Jorje Guild M.D.   On: 05/16/2022 10:57      Assessment & Plan   #TTR Amyloid Cont tafamadis Restart diuretics today  #PAH Cont macitentan, tvyaso, adcirca  #flu Medicine treating  #AF Chronic Coumadin per pharmacy dosing Goal INR 2-3  For questions or updates, please contact [place] Please consult [email or web address] for contact info under        Signed, Vickie Epley, MD  05/17/2022, 10:04 AM

## 2022-05-17 NOTE — Consult Note (Signed)
Urology Consult   Reason for consult: suspicious renal finding on CT scan  History of Present Illness: Vicki [name] is a 81 y.o. admitted to [facility] after c/o lethargy and weakness. Her w/u revealed flu and renal failure.  A CT scan was obtained 11/17. There is a cystic mass vs cyst containing hematoma on the left that measures close to 10 cm. There are no old scans for comparison.  Today Vicki [name] feels well and denies any acute complaints.   Hgb 9.6 on admit, this morning was 8.5. Her Hgb last year was 12.5. UA on admit negative for blood/RBCs. Patient does take coumadin  Past Medical History:  Diagnosis Date   Arthritis    elbow   Atrial fibrillation ([place])    Cancer ([place])    Mylenoma- top of head   CHF (congestive heart failure) ([place])    Dysrhythmia    Heart murmur    On home oxygen therapy    4 bliters   Pulmonary hypertension ([place])    Seasonal allergies    Thrombocytopenia ([place]) 7/16    Past Surgical History:  Procedure Laterality Date   BREAST LUMPECTOMY Right    benign   CARDIAC CATHETERIZATION  08/12/2006   no significant disease   CARDIAC CATHETERIZATION  10/28/2012   right heart cath done   DOPPLER ECHOCARDIOGRAPHY  Aug 09 2012   severe LVH  with mild cavity obliteration. EF 55-60% without normal relaxationbut difficult to really tell total diastolic function due to a fib; sclerotic aortic valve with no stenosis. mod mitral regurg. mod to severely dilated left atrium; mod dilated rgt right ventricle w/elevated pressures, estimated peak PA presures _0  TR jet calculation. RGT ATRIUM MOD to SEVERE,     NM MYOCAR PERF WALL MOTION  2004   EXERCISE  ,no ischemia   PARS PLANA VITRECTOMY Right 04/03/2015   Procedure: PARS PLANA VITRECTOMY WITH 25 GAUGE FOR VETREOUS HEMORRHAGE, ENDO LASER;  Surgeon: Jalene Mullet, MD;  Location: [place];  Service: Ophthalmology;  Laterality: Right;   RIGHT HEART CATHETERIZATION N/A 09/07/2013   Procedure: RIGHT HEART CATH;  Surgeon: Larey Dresser, MD;  Location: [facility] CATH LAB;  Service: Cardiovascular;  Laterality: N/A;   VAGINAL HYSTERECTOMY       Current Hospital Medications:  Home meds:  No current facility-administered medications on file prior to encounter.   Current Outpatient Medications on File Prior to Encounter  Medication Sig Dispense Refill   acetaminophen (TYLENOL) 500 MG tablet Take 500 mg by mouth every 6 (six) hours as needed for mild pain or headache.      amLODipine (NORVASC) 5 MG tablet Take 1 tablet (5 mg total) by mouth daily. 90 tablet 3   busPIRone (BUSPAR) 5 MG tablet Take 5 mg by mouth every morning.     Cholecalciferol (VITAMIN D3) 2000 units TABS Take 1 tablet by mouth daily.      Dextromethorphan HBr (DELSYM PO) Take by mouth as needed (cough).     KLOR-CON M20 20 MEQ tablet TAKE TWO TABLETS BY MOUTH IN THE MORNING AND ONE IN THE EVENING (Patient taking differently: Take 40 mEq by mouth every evening.) 90 tablet 0   macitentan (OPSUMIT) 10 MG tablet TAKE 1 TABLET (10 MG TOTAL) BY MOUTH DAILY WITH BREAKFAST. (Patient taking differently: Take 10 mg by mouth daily.) 90 tablet 3   rosuvastatin (CRESTOR) 20 MG tablet Take 20 mg by mouth at bedtime.      tadalafil, PAH, (ADCIRCA) 20 MG tablet TAKE  2 TABLETS DAILY GENERIC FOR ADCIRCA (Patient taking differently: Take 40 mg by mouth every evening.) 60 tablet 11   Tafamidis (VYNDAMAX) 61 MG CAPS Take 1 capsule by mouth daily. (Patient taking differently: Take 61 mg by mouth daily.) 90 capsule 3   torsemide (DEMADEX) 20 MG tablet Take 1.5 tablets (30 mg total) by mouth 2 (two) times daily. 90 tablet 6   Treprostinil (TYVASO) 0.6 MG/ML SOLN Inhale 54 mcg into the lungs 4 (four) times daily. (Patient taking differently: Inhale into the lungs See admin instructions. 4 puffs by mouth 3 times daily.) 30 mL 5   warfarin (COUMADIN) 5 MG tablet TAKE ONE TABLET BY MOUTH EVERY EVENING EXCEPT 1/2 TABLET ON MONDAYS OR AS DIRECTED (Patient taking differently: Take 2.5-5 mg  by mouth See admin instructions. Take 5 mg by mouth on Tuesday, Wednesday, Thursday, Friday, Saturday, and Sunday evening. Take 2.5 mg by mouth on Monday evening.) 90 tablet 1   levocetirizine (XYZAL) 5 MG tablet Take 2.5 mg by mouth daily in the afternoon. (Patient not taking: Reported on 05/15/2022)       Scheduled Meds:  busPIRone  5 mg Oral q morning   cholecalciferol  1,000 Units Oral Daily   cyanocobalamin  1,000 mcg Intramuscular Daily   docusate sodium  100 mg Oral BID   macitentan  10 mg Oral Daily   oseltamivir  30 mg Oral Daily   phosphorus  500 mg Oral BID   potassium chloride  40 mEq Oral Q6H   rosuvastatin  20 mg Oral QHS   tadalafil  20 mg Oral Daily   Tafamidis  1 capsule Oral Daily   torsemide  30 mg Oral BID   Treprostinil  54 mcg Inhalation QID   Continuous Infusions:  azithromycin 500 mg (05/16/22 2345)   cefTRIAXone (ROCEPHIN)  IV Stopped (05/16/22 2231)   PRN Meds:.  Allergies: No Known Allergies  Family History  Problem Relation Age of Onset   Heart Problems Brother    Hypertension Brother    Alzheimer's disease Mother    Heart attack Father    Thyroid disease Maternal Aunt    Pulmonary Hypertension Cousin    Heart Problems Maternal Aunt    Thyroid disease Daughter    Thyroid disease Daughter     Social History:  reports that she has never smoked. She has never used smokeless tobacco. She reports current alcohol use of about 3.0 standard drinks of alcohol per week. She reports that she does not use drugs.  ROS: A complete review of systems was performed.  All systems are negative except for pertinent findings as noted.  Physical Exam:  Vital signs in last 24 hours: Temp:  [98.6 F (37 C)-98.7 F (37.1 C)] 98.6 F (37 C) (11/19 0317) Pulse Rate:  [84-90] 84 (11/19 0847) Resp:  [17-20] 17 (11/19 0317) BP: (103-122)/(60-74) 118/74 (11/19 0847) Constitutional:  Alert and oriented, No acute distress Cardiovascular: Regular rate and  rhythm Respiratory: Normal respiratory effort, Lungs clear bilaterally GI: Abdomen is soft, nontender, nondistended, no abdominal masses GU: No CVA tenderness Neurologic: Grossly intact, no focal deficits Psychiatric: Normal mood and affect  Laboratory Data:  Recent Labs    05/15/22 0023 05/15/22 1315 05/16/22 0217 05/17/22 0342  WBC 8.1 6.1 5.6 5.0  HGB 9.6* 9.5* 8.8* 8.5*  HCT 31.0* 31.3* 27.8* 27.5*  PLT 211 204 206 213    Recent Labs    05/15/22 0023 05/15/22 0855 05/16/22 0217 05/17/22 0342  NA 138 139 140  140  K 4.1 3.6 3.7 3.0*  CL 103 107 107 107  GLUCOSE 104* 95 99 83  BUN 27* 25* 26* 21  CALCIUM 9.2 8.4* 8.5* 8.3*  CREATININE 2.62* 2.32* 2.00* 1.56*     Results for orders placed or performed during the hospital encounter of 05/14/22 (from the past 24 hour(s))  Protime-INR     Status: Abnormal   Collection Time: 05/17/22  3:42 AM  Result Value Ref Range   Prothrombin Time 29.0 (H) 11.4 - 15.2 seconds   INR 2.8 (H) 0.8 - 1.2  Procalcitonin     Status: None   Collection Time: 05/17/22  3:42 AM  Result Value Ref Range   Procalcitonin 0.71 ng/mL  Vitamin B12     Status: None   Collection Time: 05/17/22  3:42 AM  Result Value Ref Range   Vitamin B-12 269 180 - 914 pg/mL  CBC     Status: Abnormal   Collection Time: 05/17/22  3:42 AM  Result Value Ref Range   WBC 5.0 4.0 - 10.5 K/uL   RBC 2.89 (L) 3.87 - 5.11 MIL/uL   Hemoglobin 8.5 (L) 12.0 - 15.0 g/dL   HCT 27.5 (L) 36.0 - 46.0 %   MCV 95.2 80.0 - 100.0 fL   MCH 29.4 26.0 - 34.0 pg   MCHC 30.9 30.0 - 36.0 g/dL   RDW 15.4 11.5 - 15.5 %   Platelets 213 150 - 400 K/uL   nRBC 0.0 0.0 - 0.2 %  Basic metabolic panel     Status: Abnormal   Collection Time: 05/17/22  3:42 AM  Result Value Ref Range   Sodium 140 135 - 145 mmol/L   Potassium 3.0 (L) 3.5 - 5.1 mmol/L   Chloride 107 98 - 111 mmol/L   CO2 23 22 - 32 mmol/L   Glucose, Bld 83 70 - 99 mg/dL   BUN 21 8 - 23 mg/dL   Creatinine, Ser 1.56 (H)  0.44 - 1.00 mg/dL   Calcium 8.3 (L) 8.9 - 10.3 mg/dL   GFR, Estimated 33 (L) >60 mL/min   Anion gap 10 5 - 15  Phosphorus     Status: None   Collection Time: 05/17/22  3:42 AM  Result Value Ref Range   Phosphorus 2.6 2.5 - 4.6 mg/dL   Recent Results (from the past 240 hour(s))  Blood culture (routine x 2)     Status: None (Preliminary result)   Collection Time: 05/15/22  1:14 AM   Specimen: BLOOD  Result Value Ref Range Status   Specimen Description BLOOD RIGHT ANTECUBITAL  Final   Special Requests   Final    BOTTLES DRAWN AEROBIC AND ANAEROBIC Blood Culture adequate volume   Culture   Final    NO GROWTH 2 DAYS Performed at [facility], [address], [place], [place] [address]    Report Status PENDING  Incomplete  Blood culture (routine x 2)     Status: None (Preliminary result)   Collection Time: 05/15/22  1:19 AM   Specimen: BLOOD  Result Value Ref Range Status   Specimen Description BLOOD BLOOD RIGHT WRIST  Final   Special Requests   Final    BOTTLES DRAWN AEROBIC ONLY Blood Culture results may not be optimal due to an inadequate volume of blood received in culture bottles   Culture   Final    NO GROWTH 2 DAYS Performed at [facility], [place] [address], [place], [place] [address]  Report Status PENDING  Incomplete  Resp Panel by RT-PCR (Flu A&B, Covid) Anterior Nasal Swab     Status: Abnormal   Collection Time: 05/15/22  1:25 AM   Specimen: Anterior Nasal Swab  Result Value Ref Range Status   SARS Coronavirus 2 by RT PCR NEGATIVE NEGATIVE Final    Comment: (NOTE) SARS-CoV-2 target nucleic acids are NOT DETECTED.  The SARS-CoV-2 RNA is generally detectable in upper respiratory specimens during the acute phase of infection. The lowest concentration of SARS-CoV-2 viral copies this assay can detect is 138 copies/mL. A negative result does not preclude SARS-Cov-2 infection and should not be used as the sole basis for treatment or other patient management  decisions. A negative result may occur with  improper specimen collection/handling, submission of specimen other than nasopharyngeal swab, presence of viral mutation(s) within the areas targeted by this assay, and inadequate number of viral copies(<138 copies/mL). A negative result must be combined with clinical observations, patient history, and epidemiological information. The expected result is Negative.  Fact Sheet for Patients:  EntrepreneurPulse.com.au  Fact Sheet for Healthcare Providers:  IncredibleEmployment.be  This test is no t yet approved or cleared by the [place] FDA and  has been authorized for detection and/or diagnosis of SARS-CoV-2 by FDA under an Emergency Use Authorization (EUA). This EUA will remain  in effect (meaning this test can be used) for the duration of the COVID-19 declaration under Section 564(b)(1) of the Act, 21 U.S.C.section 360bbb-3(b)(1), unless the authorization is terminated  or revoked sooner.       Influenza A by PCR POSITIVE (A) NEGATIVE Final   Influenza B by PCR NEGATIVE NEGATIVE Final    Comment: (NOTE) The Xpert Xpress SARS-CoV-2/FLU/RSV plus assay is intended as an aid in the diagnosis of influenza from Nasopharyngeal swab specimens and should not be used as a sole basis for treatment. Nasal washings and aspirates are unacceptable for Xpert Xpress SARS-CoV-2/FLU/RSV testing.  Fact Sheet for Patients: EntrepreneurPulse.com.au  Fact Sheet for Healthcare Providers: IncredibleEmployment.be  This test is not yet approved or cleared by the [place] FDA and has been authorized for detection and/or diagnosis of SARS-CoV-2 by FDA under an Emergency Use Authorization (EUA). This EUA will remain in effect (meaning this test can be used) for the duration of the COVID-19 declaration under Section 564(b)(1) of the Act, 21 U.S.C. section 360bbb-3(b)(1), unless  the authorization is terminated or revoked.  Performed at [facility], [place] [address] [place], [place] [address]   Urine Culture     Status: Abnormal   Collection Time: 05/15/22  2:32 AM   Specimen: Urine, Clean Catch  Result Value Ref Range Status   Specimen Description URINE, CLEAN CATCH  Final   Special Requests NONE  Final   Culture (A)  Final    <10,000 COLONIES/mL INSIGNIFICANT GROWTH Performed at [facility], [place] [address] [place], [place] [address]    Report Status 05/16/2022 FINAL  Final    Renal Function: Recent Labs    05/15/22 0023 05/15/22 0855 05/16/22 0217 05/17/22 0342  CREATININE 2.62* 2.32* 2.00* 1.56*   Estimated Creatinine Clearance: 24.4 mL/min (A) (by C-G formula based on SCr of 1.56 mg/dL (H)).  Radiologic Imaging: CT HEAD WO CONTRAST (5MM)  Result Date: 05/16/2022 CLINICAL DATA:  Altered mental status EXAM: CT HEAD WITHOUT CONTRAST TECHNIQUE: Contiguous axial images were obtained from the base of the skull through the vertex without intravenous contrast. RADIATION DOSE REDUCTION: This exam was performed according  to the departmental dose-optimization program which includes automated exposure control, adjustment of the mA and/or kV according to patient size and/or use of iterative reconstruction technique. COMPARISON:  07/03/2016 FINDINGS: Brain: No evidence of acute infarction, hemorrhage, hydrocephalus, extra-axial collection or mass lesion/mass effect. Mild cerebral volume loss and white matter low-density for age. Vascular: No hyperdense vessel or unexpected calcification. Skull: Normal. Negative for fracture or focal lesion. Sinuses/Orbits: No acute finding. IMPRESSION: Aging brain without acute or reversible finding. Electronically Signed   By: Jorje Guild M.D.   On: 05/16/2022 10:57    I independently reviewed the above imaging studies.  Impression/Recommendation: 81 yo F with recent CT scan findings concerning for RCC vs  spontaneous bleed into cyst.  --Patient at some point will need contrasted imaging to further characterize the lesion. This can be CT or MRI pending her renal function. This study can be performed as inpatient or outpatient pending her clinical course --f/u with urology as an outpatient with imaging results --we discussed that if the scan is consistent with a bleed, she will likely need repeat imaging in the future as well  Donald Pore 05/17/2022, 12:12 PM  [facility]  Pager: [phone]

## 2022-05-18 ENCOUNTER — Inpatient Hospital Stay (HOSPITAL_COMMUNITY): Payer: Medicare HMO

## 2022-05-18 DIAGNOSIS — N2889 Other specified disorders of kidney and ureter: Secondary | ICD-10-CM | POA: Diagnosis not present

## 2022-05-18 DIAGNOSIS — J09X1 Influenza due to identified novel influenza A virus with pneumonia: Secondary | ICD-10-CM | POA: Diagnosis not present

## 2022-05-18 LAB — BASIC METABOLIC PANEL
Anion gap: 9 (ref 5–15)
BUN: 18 mg/dL (ref 8–23)
CO2: 25 mmol/L (ref 22–32)
Calcium: 8.2 mg/dL — ABNORMAL LOW (ref 8.9–10.3)
Chloride: 110 mmol/L (ref 98–111)
Creatinine, Ser: 1.47 mg/dL — ABNORMAL HIGH (ref 0.44–1.00)
GFR, Estimated: 36 mL/min — ABNORMAL LOW (ref >60)
Glucose, Bld: 88 mg/dL (ref 70–99)
Potassium: 3.1 mmol/L — ABNORMAL LOW (ref 3.5–5.1)
Sodium: 144 mmol/L (ref 135–145)

## 2022-05-18 LAB — CBC
HCT: 28.6 % — ABNORMAL LOW (ref 36.0–46.0)
Hemoglobin: 9 g/dL — ABNORMAL LOW (ref 12.0–15.0)
MCH: 29.9 pg (ref 26.0–34.0)
MCHC: 31.5 g/dL (ref 30.0–36.0)
MCV: 95 fL (ref 80.0–100.0)
Platelets: 208 10*3/uL (ref 150–400)
RBC: 3.01 MIL/uL — ABNORMAL LOW (ref 3.87–5.11)
RDW: 15.4 % (ref 11.5–15.5)
WBC: 4.1 10*3/uL (ref 4.0–10.5)
nRBC: 0 % (ref 0.0–0.2)

## 2022-05-18 LAB — PROTIME-INR
INR: 2.9 — ABNORMAL HIGH (ref 0.8–1.2)
Prothrombin Time: 29.8 seconds — ABNORMAL HIGH (ref 11.4–15.2)

## 2022-05-18 MED ORDER — POTASSIUM CHLORIDE CRYS ER 20 MEQ PO TBCR
40.0000 meq | EXTENDED_RELEASE_TABLET | Freq: Four times a day (QID) | ORAL | Status: AC
  Administered 2022-05-18 (×2): 40 meq via ORAL
  Filled 2022-05-18 (×2): qty 2

## 2022-05-18 MED ORDER — GADOBUTROL 1 MMOL/ML IV SOLN
6.0000 mL | Freq: Once | INTRAVENOUS | Status: AC | PRN
  Administered 2022-05-18: 6 mL via INTRAVENOUS

## 2022-05-18 MED ORDER — TADALAFIL 20 MG PO TABS
40.0000 mg | ORAL_TABLET | Freq: Every day | ORAL | Status: DC
  Administered 2022-05-19: 40 mg via ORAL
  Filled 2022-05-18: qty 2

## 2022-05-18 MED ORDER — TADALAFIL 20 MG PO TABS
20.0000 mg | ORAL_TABLET | Freq: Once | ORAL | Status: AC
  Administered 2022-05-18: 20 mg via ORAL
  Filled 2022-05-18: qty 1

## 2022-05-18 MED ORDER — WARFARIN 0.5 MG HALF TABLET
0.5000 mg | ORAL_TABLET | Freq: Once | ORAL | Status: AC
  Administered 2022-05-18: 0.5 mg via ORAL
  Filled 2022-05-18 (×2): qty 1

## 2022-05-18 NOTE — Progress Notes (Signed)
Mobility Specialist Progress Note:   05/18/22 1031  Mobility  Activity Ambulated with assistance in hallway  Level of Assistance Standby assist, set-up cues, supervision of patient - no hands on  Assistive Device Front wheel walker  Distance Ambulated (ft) 300 ft  Activity Response Tolerated well  Mobility Referral Yes  $Mobility charge 1 Mobility   Pt received in bathroom and agreeable. No complaints. Pt left in chair with all needs met and call bell in reach.   Andrey Campanile Mobility Specialist Please contact via SecureChat or  Rehab office at [phone]

## 2022-05-18 NOTE — Progress Notes (Addendum)
ANTICOAGULATION CONSULT NOTE - Follow Up Consult  Pharmacy Consult for warfarin Indication: atrial fibrillation  No Known Allergies  Patient Measurements: Height: _0  (157.5 cm) Weight: 61 kg (134 lb 7.7 oz) IBW/kg (Calculated) : 50.1  Vital Signs: Temp: 98.2 F (36.8 C) (11/20 0744) Temp Source: Oral (11/20 0744) BP: 114/70 (11/20 0800) Pulse Rate: 75 (11/20 0744)  Labs: Recent Labs    05/16/22 0217 05/17/22 0342 05/18/22 0308  HGB 8.8* 8.5* 9.0*  HCT 27.8* 27.5* 28.6*  PLT 206 213 208  LABPROT 32.7* 29.0* 29.8*  INR 3.2* 2.8* 2.9*  CREATININE 2.00* 1.56* 1.47*     Estimated Creatinine Clearance: 25.8 mL/min (A) (by C-G formula based on SCr of 1.47 mg/dL (H)).   Medications:  Scheduled:   busPIRone  5 mg Oral q morning   cholecalciferol  1,000 Units Oral Daily   cyanocobalamin  1,000 mcg Intramuscular Daily   docusate sodium  100 mg Oral BID   macitentan  10 mg Oral Daily   oseltamivir  30 mg Oral Daily   potassium chloride  40 mEq Oral Q6H   rosuvastatin  20 mg Oral QHS   tadalafil  20 mg Oral Once   [START ON 05/19/2022] tadalafil  40 mg Oral Daily   Tafamidis  1 capsule Oral Daily   torsemide  30 mg Oral BID   Treprostinil  54 mcg Inhalation QID   Warfarin - Pharmacist Dosing Inpatient   Does not apply q1600    Assessment: 81 YO F PMH of chronic atrial fibrillation and is on PTA warfarin. PTA dose is Warfarin 5 mg by mouth every evening except on Mondays 2.5 mg.   INR today came back therapeutic at 2.9. Warfarin had been held until restart on 11/19 - of note, on azithromycin which can impact warfarin sensitivity. No s/sx of bleeding noted. Hgb 9, plt 208- stable.   Goal of Therapy:  INR 2-3 Monitor platelets by anticoagulation protocol: Yes   Plan:  Warfarin 0.5 mg tonight  Monitor daily CBC, INR Monitor signs and symptoms of bleeding   Antonietta Jewel, PharmD, BCCCP Clinical Pharmacist  Phone: [phone] 05/18/2022 12:33 PM  Please check  AMION for all [place] phone numbers After 10:00 PM, call [facility] [phone]

## 2022-05-18 NOTE — Progress Notes (Signed)
Patient ID: Vicki [name], female   DOB: [birth year], 81 y.o.   MRN: [id number]     Advanced Heart Failure Rounding Note  PCP-Cardiologist: None   Subjective:    Breathing getting better, still has cough.    Objective:   Weight Range: 61 kg Body mass index is 24.6 kg/m.   Vital Signs:   Temp:  [98.2 F (36.8 C)-98.5 F (36.9 C)] 98.2 F (36.8 C) (11/20 0744) Pulse Rate:  [72-95] 75 (11/20 0744) Resp:  [17-18] 17 (11/20 0744) BP: (100-120)/(54-81) 114/70 (11/20 0800) SpO2:  [97 %] 97 % (11/20 0319) Weight:  [61 kg] 61 kg (11/20 0500) Last BM Date : 05/17/22  Weight change: Filed Weights   05/15/22 0005 05/16/22 0500 05/18/22 0500  Weight: 60.3 kg 61.4 kg 61 kg    Intake/Output:   Intake/Output Summary (Last 24 hours) at 05/18/2022 0909 Last data filed at 05/18/2022 4008 Gross per 24 hour  Intake 470 ml  Output --  Net 470 ml      Physical Exam    General:  Well appearing. No resp difficulty HEENT: Normal Neck: Supple. JVP not elevated. Carotids 2+ bilat; no bruits. No lymphadenopathy or thyromegaly appreciated. Cor: PMI nondisplaced. Irregular rate & rhythm. No rubs, gallops. 2/6 early SEM RUSB.  Lungs: Occasional rhonchi.  Abdomen: Soft, nontender, nondistended. No hepatosplenomegaly. No bruits or masses. Good bowel sounds. Extremities: No cyanosis, clubbing, rash, edema Neuro: Alert & orientedx3, cranial nerves grossly intact. moves all 4 extremities w/o difficulty. Affect pleasant   Telemetry   Atrial fibrillation 70s (personally reviewed)   Labs    CBC Recent Labs    05/17/22 0342 05/18/22 0308  WBC 5.0 4.1  HGB 8.5* 9.0*  HCT 27.5* 28.6*  MCV 95.2 95.0  PLT 213 676   Basic Metabolic Panel Recent Labs    05/17/22 0342 05/18/22 0308  NA 140 144  K 3.0* 3.1*  CL 107 110  CO2 23 25  GLUCOSE 83 88  BUN 21 18  CREATININE 1.56* 1.47*  CALCIUM 8.3* 8.2*  PHOS 2.6  --    Liver Function Tests No results for input(s): "AST", "ALT",  "ALKPHOS", "BILITOT", "PROT", "ALBUMIN" in the last 72 hours. No results for input(s): "LIPASE", "AMYLASE" in the last 72 hours. Cardiac Enzymes No results for input(s): "CKTOTAL", "CKMB", "CKMBINDEX", "TROPONINI" in the last 72 hours.  BNP: BNP (last 3 results) Recent Labs    11/11/21 1415 03/31/22 1441 05/15/22 0023  BNP 477.3* 1,091.3* 767.4*    ProBNP (last 3 results) No results for input(s): "PROBNP" in the last 8760 hours.   D-Dimer No results for input(s): "DDIMER" in the last 72 hours. Hemoglobin A1C No results for input(s): "HGBA1C" in the last 72 hours. Fasting Lipid Panel No results for input(s): "CHOL", "HDL", "LDLCALC", "TRIG", "CHOLHDL", "LDLDIRECT" in the last 72 hours. Thyroid Function Tests Recent Labs    05/16/22 0216  TSH 0.814    Other results:   Imaging    No results found.   Medications:     Scheduled Medications:  busPIRone  5 mg Oral q morning   cholecalciferol  1,000 Units Oral Daily   cyanocobalamin  1,000 mcg Intramuscular Daily   docusate sodium  100 mg Oral BID   macitentan  10 mg Oral Daily   oseltamivir  30 mg Oral Daily   potassium chloride  40 mEq Oral Q6H   rosuvastatin  20 mg Oral QHS   tadalafil  20 mg Oral Once   [START  ON 05/19/2022] tadalafil  40 mg Oral Daily   Tafamidis  1 capsule Oral Daily   torsemide  30 mg Oral BID   Treprostinil  54 mcg Inhalation QID   Warfarin - Pharmacist Dosing Inpatient   Does not apply q1600    Infusions:  azithromycin 500 mg (05/17/22 2236)   cefTRIAXone (ROCEPHIN)  IV 1 g (05/17/22 2127)    PRN Medications:    Assessment/Plan   1. ID: Patient admitted with influenza A and possible secondary bacterial PNA.  Breathing is improving.  - Continue oseltamivir.  - Continue ceftriaxone/azithromycin.  2. Pulmonary HTN: Patient has severe pulmonary arterial HTN.  RHC in 3/15 showed CI 1.97, PVR 9. 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 Collagen vascular disease workup  was negative (negative RF, ANA and negative anti-SCL-70).  V/Q scan was not suggestive of chronic PEs.  PFTs were suggestive of restrictive lung disease but CT chest and evaluation by pulmonary did not suggest interstitial lung disease.  Sleep study did not show OSA.  Echo in 3/22 showed normal RV size and systolic function, PASP estimation 37 mmHg.  Echo 5/23 showed EF 60-65%, severe concentric LVH, normal RV, PASP 49 mmHg, moderate MR, small pericardial effusion.  - Continue Tyvaso, macitentan, Adcirca. I will increase Adcirca to 40 mg daily which is her home dose.  3. Chronic diastolic CHF: EF preserved on echo with moderate concentric LVH and a small pericardial effusion.  It is possible that the LVH is due to years of HTN.  The cardiac MRI was not definitively suggestive of cardiac amyloidosis.  I was still concerned for amyloidosis given the appearance of the LV myocardium on echoes.  Abdominal fat pad biopsy in 2015 showed no evidence for amyloidosis. She has some symptoms suggestive of mild peripheral neuropathy.  PYP scan in 10/18 was not suggestive of TTR amyloidosis. I assessed her again for cardiac amyloidosis in 2020: myeloma panel and urine immunofixation negative, PYP scan in 12/20 was grade 2 with H/CL 1.97 but activity was localized to the atria.  [employer] gene testing for TTR amyloidosis was negative.  PYP scan repeated 9/22 was grade 2 with H/CL 1.28 => isolated atrial uptake again.  Echo in 5/23 was consistent with cardiac amyloidosis, EF 60-65%, severe concentric LVH, normal RV, PASP 49 mmHg, moderate MR, small pericardial effusion. She does not look volume overloaded on exam.  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 This may be an earlier manifestation before LV involvement.  This can be associated with atrial fibrillation. She will continue tafamidis.   - Continue torsemide 30 mg bid, this is her home dose.  - Replace K.  4. HTN: BP  controlled.  - She is on amlodipine at home, currently on hold.  5. Chronic atrial fibrillation: Continue coumadin.  She is off metoprolol due to bradycardia. Holter off metoprolol did not show any high rate episodes.  6. Hyperlipidemia: On Crestor.   Length of Stay: 3  [name], MD  05/18/2022, 9:09 AM  Advanced Heart Failure Team Pager [phone] (M-F; 7a - 5p)  Please contact [facility] for night-coverage after hours (5p -7a ) and weekends on [email or web address]

## 2022-05-18 NOTE — Progress Notes (Signed)
PROGRESS NOTE    Vicki [name]  SHF:[id number] DOB: 25-Jun-1941 DOA: 05/14/2022 PCP: Deland Pretty, MD    Chief Complaint  Patient presents with   Weakness   Altered Mental Status    Brief Narrative:    Vicki [name] is a 81 y.o. female with medical history significant of severe pulmonary hypertension/moderate MR on chronic nocturnal oxygen at 4 L/min, TTR cardiac amyloidosis with associated HFpEF plushistory of thrombocytopenia, chronic atrial fibrillation on anticoagulation.  Patient presented to the ER after being noted at home with severe lethargy and inability to mobilize effectively.  Her work-up was significant for sepsis due to influenza, worsening renal failure.    Assessment & Plan:    Sepsis physiology secondary to influenza A with secondary bacterial pneumonia - Patient presented with fever, hypotension, altered mental status secondary to influenza A Lactic acid was 1.2 -Continue with Tamiflu -Continue with IV antibiotics   Chronic respiratory failure with hypoxia secondary to pulmonary hypertension - Baseline O2 requirements 4 L/min.   -CHF team input greatly appreciated, continue with home medications Tyvaso, Cialis and Opsumit  Elevated troponin Suspect related to demand ischemia since EKG unremarkable  Hypokalemia -Repleted   Chronic A-fib  - continue with warfarin -Beta-blockers has been discontinued in the past due to bradycardia   New left renal mass -This  is a new finding, urology input greatly appreciated, now creatinine has improved we will proceed with CT abdomen with and without IV contrast renal mass protocol as discussed with radiology.   Cardiac amyloidosis/HFpEF Continue preadmission Tafamidis -Back on diuretics Management per cardiology    AKI Baseline creatinine 1.05-1.49 Presenting creatinine 2.62   HTN Hold Norvasc given suboptimal blood pressure readings secondary to sepsis physiology   Chronic atrial fibrillation on  warfarin Currently rate controlled and experiencing unifocal PVCs Continue with warfarin, pharmacy to dose   Abnormal urinalysis Secondary to dehydration Urine culture pending, she is empirically covered with Rocephin   Obstipation Scheduled Colace  Reports decline in mental status over the last few months, B12 is borderline, will start on supplements, CT head with no acute findings.     DVT prophylaxis: Warfarin Code Status: Full code Family Communication: Discussed with son at bedside Disposition:   Status is: Inpatient    Consultants:  - cardiology Urology  Subjective:  Reports she is feeling better, no further dyspnea, cough is improved Objective: Vitals:   05/18/22 0319 05/18/22 0500 05/18/22 0744 05/18/22 0800  BP: (!) 110/54  111/63 114/70  Pulse: 75  75   Resp: 18  17   Temp: 98.5 F (36.9 C)  98.2 F (36.8 C)   TempSrc: Oral  Oral   SpO2: 97%     Weight:  61 kg    Height:        Intake/Output Summary (Last 24 hours) at 05/18/2022 1502 Last data filed at 05/18/2022 5027 Gross per 24 hour  Intake 470 ml  Output --  Net 470 ml   Filed Weights   05/15/22 0005 05/16/22 0500 05/18/22 0500  Weight: 60.3 kg 61.4 kg 61 kg    Examination:   Awake Alert, Oriented X 3, No new F.N deficits, Normal affect Symmetrical Chest wall movement, Good air movement bilaterally, CTAB RRR,No Gallops,Rubs, murmur + +ve B.Sounds, Abd Soft, No tenderness, No rebound - guarding or rigidity. No Cyanosis, Clubbing or edema, No new Rash or bruise        Data Reviewed: I have personally reviewed following labs and imaging studies  CBC:  Recent Labs  Lab 05/15/22 0023 05/15/22 1315 05/16/22 0217 05/17/22 0342 05/18/22 0308  WBC 8.1 6.1 5.6 5.0 4.1  NEUTROABS 5.6  --   --   --   --   HGB 9.6* 9.5* 8.8* 8.5* 9.0*  HCT 31.0* 31.3* 27.8* 27.5* 28.6*  MCV 97.8 99.7 96.2 95.2 95.0  PLT 211 204 206 213 500    Basic Metabolic Panel: Recent Labs  Lab  05/15/22 0023 05/15/22 0855 05/16/22 0217 05/17/22 0342 05/18/22 0308  NA 138 139 140 140 144  K 4.1 3.6 3.7 3.0* 3.1*  CL 103 107 107 107 110  CO2 _0 GLUCOSE 104* 95 99 83 88  BUN 27* 25* 26* 21 18  CREATININE 2.62* 2.32* 2.00* 1.56* 1.47*  CALCIUM 9.2 8.4* 8.5* 8.3* 8.2*  PHOS  --   --   --  2.6  --     GFR: Estimated Creatinine Clearance: 25.8 mL/min (A) (by C-G formula based on SCr of 1.47 mg/dL (H)).  Liver Function Tests: Recent Labs  Lab 05/15/22 0023 05/15/22 0855  AST 29 25  ALT 11 8  ALKPHOS 34* 28*  BILITOT 0.6 0.5  PROT 7.2 5.6*  ALBUMIN 2.9* 2.2*    CBG: Recent Labs  Lab 05/15/22 0037  GLUCAP 89     Recent Results (from the past 240 hour(s))  Blood culture (routine x 2)     Status: None (Preliminary result)   Collection Time: 05/15/22  1:14 AM   Specimen: BLOOD  Result Value Ref Range Status   Specimen Description BLOOD RIGHT ANTECUBITAL  Final   Special Requests   Final    BOTTLES DRAWN AEROBIC AND ANAEROBIC Blood Culture adequate volume   Culture   Final    NO GROWTH 3 DAYS Performed at [facility], [address] [place], [place] [address]    Report Status PENDING  Incomplete  Blood culture (routine x 2)     Status: None (Preliminary result)   Collection Time: 05/15/22  1:19 AM   Specimen: BLOOD  Result Value Ref Range Status   Specimen Description BLOOD BLOOD RIGHT WRIST  Final   Special Requests   Final    BOTTLES DRAWN AEROBIC ONLY Blood Culture results may not be optimal due to an inadequate volume of blood received in culture bottles   Culture   Final    NO GROWTH 3 DAYS Performed at [facility], [place] [address] [place], [place] [address]    Report Status PENDING  Incomplete  Resp Panel by RT-PCR (Flu A&B, Covid) Anterior Nasal Swab     Status: Abnormal   Collection Time: 05/15/22  1:25 AM   Specimen: Anterior Nasal Swab  Result Value Ref Range Status   SARS Coronavirus 2 by RT PCR NEGATIVE NEGATIVE  Final    Comment: (NOTE) SARS-CoV-2 target nucleic acids are NOT DETECTED.  The SARS-CoV-2 RNA is generally detectable in upper respiratory specimens during the acute phase of infection. The lowest concentration of SARS-CoV-2 viral copies this assay can detect is 138 copies/mL. A negative result does not preclude SARS-Cov-2 infection and should not be used as the sole basis for treatment or other patient management decisions. A negative result may occur with  improper specimen collection/handling, submission of specimen other than nasopharyngeal swab, presence of viral mutation(s) within the areas targeted by this assay, and inadequate number of viral copies(<138 copies/mL). A negative result must be combined with clinical observations, patient history,  and epidemiological information. The expected result is Negative.  Fact Sheet for Patients:  EntrepreneurPulse.com.au  Fact Sheet for Healthcare Providers:  IncredibleEmployment.be  This test is no t yet approved or cleared by the [place] FDA and  has been authorized for detection and/or diagnosis of SARS-CoV-2 by FDA under an Emergency Use Authorization (EUA). This EUA will remain  in effect (meaning this test can be used) for the duration of the COVID-19 declaration under Section 564(b)(1) of the Act, 21 U.S.C.section 360bbb-3(b)(1), unless the authorization is terminated  or revoked sooner.       Influenza A by PCR POSITIVE (A) NEGATIVE Final   Influenza B by PCR NEGATIVE NEGATIVE Final    Comment: (NOTE) The Xpert Xpress SARS-CoV-2/FLU/RSV plus assay is intended as an aid in the diagnosis of influenza from Nasopharyngeal swab specimens and should not be used as a sole basis for treatment. Nasal washings and aspirates are unacceptable for Xpert Xpress SARS-CoV-2/FLU/RSV testing.  Fact Sheet for Patients: EntrepreneurPulse.com.au  Fact Sheet for Healthcare  Providers: IncredibleEmployment.be  This test is not yet approved or cleared by the [place] FDA and has been authorized for detection and/or diagnosis of SARS-CoV-2 by FDA under an Emergency Use Authorization (EUA). This EUA will remain in effect (meaning this test can be used) for the duration of the COVID-19 declaration under Section 564(b)(1) of the Act, 21 U.S.C. section 360bbb-3(b)(1), unless the authorization is terminated or revoked.  Performed at [facility], [place] [address], [place], [place] [address]   Urine Culture     Status: Abnormal   Collection Time: 05/15/22  2:32 AM   Specimen: Urine, Clean Catch  Result Value Ref Range Status   Specimen Description URINE, CLEAN CATCH  Final   Special Requests NONE  Final   Culture (A)  Final    <10,000 COLONIES/mL INSIGNIFICANT GROWTH Performed at [facility], [place] [address], [place], [place] [address]    Report Status 05/16/2022 FINAL  Final         Radiology Studies: No results found.      Scheduled Meds:  busPIRone  5 mg Oral q morning   cholecalciferol  1,000 Units Oral Daily   cyanocobalamin  1,000 mcg Intramuscular Daily   docusate sodium  100 mg Oral BID   macitentan  10 mg Oral Daily   oseltamivir  30 mg Oral Daily   rosuvastatin  20 mg Oral QHS   [START ON 05/19/2022] tadalafil  40 mg Oral Daily   Tafamidis  1 capsule Oral Daily   torsemide  30 mg Oral BID   Treprostinil  54 mcg Inhalation QID   warfarin  0.5 mg Oral ONCE-1600   Warfarin - Pharmacist Dosing Inpatient   Does not apply q1600   Continuous Infusions:  azithromycin 500 mg (05/17/22 2236)   cefTRIAXone (ROCEPHIN)  IV 1 g (05/17/22 2127)     LOS: 3 days    Phillips Climes, MD [facility]   To contact the attending provider between 7A-7P or the covering provider during after hours 7P-7A, please log into the web site [email or web address] and access using universal [place] password for that  web site. If you do not have the password, please call the hospital operator.  05/18/2022, 3:02 PM

## 2022-05-18 NOTE — Progress Notes (Signed)
Mobility Specialist: Progress Note   05/18/22 1711  Mobility  Activity Ambulated with assistance in hallway  Level of Assistance Standby assist, set-up cues, supervision of patient - no hands on  Assistive Device Front wheel walker  Distance Ambulated (ft) 500 ft  Activity Response Tolerated well  Mobility Referral Yes  $Mobility charge 1 Mobility   Post-Mobility: 87 HR  Pt received coming out of the BR and agreeable to mobility. No c/o throughout. Pt sitting EOB after session with call bell and phone in reach.   Halsey Tellis Spivak Mobility Specialist Please contact via SecureChat or Rehab office at [phone]

## 2022-05-19 ENCOUNTER — Other Ambulatory Visit (HOSPITAL_COMMUNITY): Payer: Self-pay

## 2022-05-19 DIAGNOSIS — I43 Cardiomyopathy in diseases classified elsewhere: Secondary | ICD-10-CM

## 2022-05-19 DIAGNOSIS — E854 Organ-limited amyloidosis: Secondary | ICD-10-CM | POA: Diagnosis not present

## 2022-05-19 DIAGNOSIS — I4891 Unspecified atrial fibrillation: Secondary | ICD-10-CM | POA: Diagnosis not present

## 2022-05-19 DIAGNOSIS — I5032 Chronic diastolic (congestive) heart failure: Secondary | ICD-10-CM

## 2022-05-19 DIAGNOSIS — J09X1 Influenza due to identified novel influenza A virus with pneumonia: Secondary | ICD-10-CM | POA: Diagnosis not present

## 2022-05-19 LAB — CBC
HCT: 27.3 % — ABNORMAL LOW (ref 36.0–46.0)
Hemoglobin: 8.7 g/dL — ABNORMAL LOW (ref 12.0–15.0)
MCH: 30.2 pg (ref 26.0–34.0)
MCHC: 31.9 g/dL (ref 30.0–36.0)
MCV: 94.8 fL (ref 80.0–100.0)
Platelets: 197 10*3/uL (ref 150–400)
RBC: 2.88 MIL/uL — ABNORMAL LOW (ref 3.87–5.11)
RDW: 15.1 % (ref 11.5–15.5)
WBC: 3.5 10*3/uL — ABNORMAL LOW (ref 4.0–10.5)
nRBC: 0 % (ref 0.0–0.2)

## 2022-05-19 LAB — BASIC METABOLIC PANEL
Anion gap: 11 (ref 5–15)
Anion gap: 10 (ref 5–15)
BUN: 14 mg/dL (ref 8–23)
BUN: 14 mg/dL (ref 8–23)
CO2: 27 mmol/L (ref 22–32)
CO2: 25 mmol/L (ref 22–32)
Calcium: 8.3 mg/dL — ABNORMAL LOW (ref 8.9–10.3)
Calcium: 8.3 mg/dL — ABNORMAL LOW (ref 8.9–10.3)
Chloride: 104 mmol/L (ref 98–111)
Chloride: 105 mmol/L (ref 98–111)
Creatinine, Ser: 1.18 mg/dL — ABNORMAL HIGH (ref 0.44–1.00)
Creatinine, Ser: 1.22 mg/dL — ABNORMAL HIGH (ref 0.44–1.00)
GFR, Estimated: 46 mL/min — ABNORMAL LOW (ref >60)
GFR, Estimated: 45 mL/min — ABNORMAL LOW (ref >60)
Glucose, Bld: 87 mg/dL (ref 70–99)
Glucose, Bld: 111 mg/dL — ABNORMAL HIGH (ref 70–99)
Potassium: 2.7 mmol/L — CL (ref 3.5–5.1)
Potassium: 4.3 mmol/L (ref 3.5–5.1)
Sodium: 142 mmol/L (ref 135–145)
Sodium: 140 mmol/L (ref 135–145)

## 2022-05-19 LAB — PROTIME-INR
INR: 3.3 — ABNORMAL HIGH (ref 0.8–1.2)
Prothrombin Time: 33.4 seconds — ABNORMAL HIGH (ref 11.4–15.2)

## 2022-05-19 LAB — MAGNESIUM: Magnesium: 1.7 mg/dL (ref 1.7–2.4)

## 2022-05-19 MED ORDER — POTASSIUM CHLORIDE 10 MEQ/100ML IV SOLN
10.0000 meq | INTRAVENOUS | Status: DC
  Administered 2022-05-19 (×2): 10 meq via INTRAVENOUS
  Filled 2022-05-19 (×3): qty 100

## 2022-05-19 MED ORDER — MAGNESIUM SULFATE IN D5W 1-5 GM/100ML-% IV SOLN
1.0000 g | Freq: Once | INTRAVENOUS | Status: AC
  Administered 2022-05-19: 1 g via INTRAVENOUS
  Filled 2022-05-19: qty 100

## 2022-05-19 MED ORDER — POTASSIUM CHLORIDE CRYS ER 20 MEQ PO TBCR
40.0000 meq | EXTENDED_RELEASE_TABLET | ORAL | Status: AC
  Administered 2022-05-19 (×2): 40 meq via ORAL
  Filled 2022-05-19: qty 2

## 2022-05-19 MED ORDER — SPIRONOLACTONE 12.5 MG HALF TABLET
12.5000 mg | ORAL_TABLET | Freq: Every day | ORAL | Status: DC
  Administered 2022-05-19: 12.5 mg via ORAL
  Filled 2022-05-19: qty 1

## 2022-05-19 MED ORDER — WARFARIN SODIUM 5 MG PO TABS: ORAL_TABLET | ORAL | 1 refills | Status: DC

## 2022-05-19 MED ORDER — POTASSIUM CHLORIDE CRYS ER 20 MEQ PO TBCR
40.0000 meq | EXTENDED_RELEASE_TABLET | Freq: Once | ORAL | Status: AC
  Administered 2022-05-19: 40 meq via ORAL
  Filled 2022-05-19: qty 2

## 2022-05-19 MED ORDER — POTASSIUM CHLORIDE 10 MEQ/100ML IV SOLN
10.0000 meq | INTRAVENOUS | Status: AC
  Administered 2022-05-19 (×2): 10 meq via INTRAVENOUS
  Filled 2022-05-19 (×2): qty 100

## 2022-05-19 MED ORDER — SPIRONOLACTONE 25 MG PO TABS
12.5000 mg | ORAL_TABLET | Freq: Every day | ORAL | 0 refills | Status: DC
  Filled 2022-05-19: qty 30, 60d supply, fill #0

## 2022-05-19 MED ORDER — VITAMIN B-12 1000 MCG PO TABS
1000.0000 ug | ORAL_TABLET | Freq: Every day | ORAL | 0 refills | Status: AC
  Filled 2022-05-19: qty 30, 30d supply, fill #0

## 2022-05-19 NOTE — Discharge Summary (Signed)
Physician Discharge Summary  Vicki [name] IRS:[id number] DOB: 03/12/1941 DOA: 05/14/2022  PCP: Deland Pretty, MD  Admit date: 05/14/2022 Discharge date: 05/19/2022  Admitted From: (Home) Disposition:  (Home)  Recommendations for Outpatient Follow-up:  Follow up with PCP in 1-2 weeks Please obtain BMP/CBC in one week To follow with urology regarding hemorrhagic cyst, they will arrange for follow-up and imaging Patient to follow with CHF team cardiology as an outpatient, outpatient appointment has been scheduled already. Recheck B12 level in 4 to 6 weeks after oral supplements.   Discharge Condition: (Stable) CODE STATUS: (FULL) Diet recommendation: [facility] course   Vicki [name] is a 81 y.o. female with medical history significant of severe pulmonary hypertension/moderate MR on chronic nocturnal oxygen at 4 L/min, TTR cardiac amyloidosis with associated HFpEF plushistory of thrombocytopenia, chronic atrial fibrillation on anticoagulation.  Patient presented to the ER after being noted at home with severe lethargy and inability to mobilize effectively.  Her work-up was significant for sepsis due to influenza, worsening renal failure.   She was admitted for further work-up.  Sepsis physiology secondary to influenza A with secondary bacterial pneumonia - Patient presented with fever, hypotension, altered mental status secondary to influenza A And possible bacterial pneumonia given borderline procalcitonin, she was treated with Tamiflu, and IV antibiotics, sepsis resolved at time of discharge, no further need for oral antibiotics on discharge.  Chronic respiratory failure with hypoxia secondary to pulmonary hypertension - Baseline O2 requirements 4 L/min.   -CHF team input greatly appreciated, continue with home medications Tyvaso, Cialis and Opsumit   Elevated troponin Suspect related to demand ischemia since EKG unremarkable   Hypokalemia -Repleted, lactone  added at time of discharge per CHF team recommendations   Chronic A-fib  - continue with warfarin -Beta-blockers has been discontinued in the past due to bradycardia   New left renal mass/Magick cyst -GI input greatly appreciated, abdomen with/without IV contrast renal mass protocol has been obtained, findings are suspicious for hemorrhagic cyst, just to arrange for outpatient follow-up, discussed with Dr. Cain Sieve , it is okay to continue with warfarin given stable CBC   Cardiac amyloidosis/HFpEF Continue preadmission Tafamidis -Back on diuretics Management per cardiology     AKI Baseline creatinine 1.05-1.49 Presenting creatinine 2.62 Continuing 1.2 at time of discharge   HTN Hold Norvasc at time of discharge, and she was started on Aldactone mainly for hypokalemia per CHF team recommendation.  .   Chronic atrial fibrillation on warfarin Currently rate controlled and experiencing unifocal PVCs Continue with warfarin, pharmacy to dose   Obstipation Scheduled Colace   Reports decline in mental status over the last few months, B12 is borderline, will start on supplements, CT head with no acute findings.    Discharge Diagnoses:  Principal Problem:   Influenza A with pneumonia Active Problems:   Pulmonary hypertension (HCC)   Atrial fibrillation (HCC)   Chronic diastolic CHF (congestive heart failure) (HCC)   Chronic respiratory failure with hypoxia -baseline 4L/min   Cardiac amyloidosis (HCC)   AKI (acute kidney injury) ([place])   Left renal mass   Elevated troponin    Discharge Instructions  Discharge Instructions     Diet - low sodium heart healthy   Complete by: As directed    Discharge instructions   Complete by: As directed    Follow with Primary MD Deland Pretty, MD in 10 days   Get CBC, CMP,  checked  by Primary MD next visit.  Activity: As tolerated with Full fall precautions use walker/cane & assistance as needed   Disposition Home    Diet: Heart  Healthy   For Heart failure patients - Check your Weight same time everyday, if you gain over 2 pounds, or you develop in leg swelling, experience more shortness of breath or chest pain, call your Primary MD immediately. Follow Cardiac Low Salt Diet and 1.5 lit/day fluid restriction.   On your next visit with your primary care physician please Get Medicines reviewed and adjusted.   Please request your Prim.MD to go over all Hospital Tests and Procedure/Radiological results at the follow up, please get all Hospital records sent to your Prim MD by signing hospital release before you go home.   If you experience worsening of your admission symptoms, develop shortness of breath, life threatening emergency, suicidal or homicidal thoughts you must seek medical attention immediately by calling 911 or calling your MD immediately  if symptoms less severe.  You Must read complete instructions/literature along with all the possible adverse reactions/side effects for all the Medicines you take and that have been prescribed to you. Take any new Medicines after you have completely understood and accpet all the possible adverse reactions/side effects.   Do not drive, operating heavy machinery, perform activities at heights, swimming or participation in water activities or provide baby sitting services if your were admitted for syncope or siezures until you have seen by Primary MD or a Neurologist and advised to do so again.  Do not drive when taking Pain medications.    Do not take more than prescribed Pain, Sleep and Anxiety Medications  Special Instructions: If you have smoked or chewed Tobacco  in the last 2 yrs please stop smoking, stop any regular Alcohol  and or any Recreational drug use.  Wear Seat belts while driving.   Please note  You were cared for by a hospitalist during your hospital stay. If you have any questions about your discharge medications or the care you received while you were in  the hospital after you are discharged, you can call the unit and asked to speak with the hospitalist on call if the hospitalist that took care of you is not available. Once you are discharged, your primary care physician will handle any further medical issues. Please note that NO REFILLS for any discharge medications will be authorized once you are discharged, as it is imperative that you return to your primary care physician (or establish a relationship with a primary care physician if you do not have one) for your aftercare needs so that they can reassess your need for medications and monitor your lab values.   Increase activity slowly   Complete by: As directed       Allergies as of 05/19/2022   No Known Allergies      Medication List     STOP taking these medications    amLODipine 5 MG tablet Commonly known as: NORVASC       TAKE these medications    acetaminophen 500 MG tablet Commonly known as: TYLENOL Take 500 mg by mouth every 6 (six) hours as needed for mild pain or headache.   busPIRone 5 MG tablet Commonly known as: BUSPAR Take 5 mg by mouth every morning.   cyanocobalamin 1000 MCG tablet Commonly known as: VITAMIN B12 Take 1 tablet (1,000 mcg total) by mouth daily.   DELSYM PO Take by mouth as needed (cough).   Klor-Con M20 20 MEQ tablet Generic drug:  potassium chloride SA TAKE TWO TABLETS BY MOUTH IN THE MORNING AND ONE IN THE EVENING What changed: See the new instructions.   levocetirizine 5 MG tablet Commonly known as: XYZAL Take 2.5 mg by mouth daily in the afternoon.   Opsumit 10 MG tablet Generic drug: macitentan TAKE 1 TABLET (10 MG TOTAL) BY MOUTH DAILY WITH BREAKFAST. What changed:  how much to take how to take this when to take this additional instructions   rosuvastatin 20 MG tablet Commonly known as: CRESTOR Take 20 mg by mouth at bedtime.   spironolactone 25 MG tablet Commonly known as: ALDACTONE Take 0.5 tablets (12.5 mg total)  by mouth daily. Start taking on: May 20, 2022   tadalafil (PAH) 20 MG tablet Commonly known as: ADCIRCA TAKE 2 TABLETS DAILY GENERIC FOR ADCIRCA What changed:  how much to take how to take this when to take this additional instructions   torsemide 20 MG tablet Commonly known as: DEMADEX Take 1.5 tablets (30 mg total) by mouth 2 (two) times daily.   Tyvaso 0.6 MG/ML Soln Generic drug: Treprostinil Inhale 54 mcg into the lungs 4 (four) times daily. What changed:  how much to take when to take this additional instructions   Vitamin D3 50 MCG (2000 UT) Tabs Take 1 tablet by mouth daily.   Vyndamax 61 MG Caps Generic drug: Tafamidis Take 1 capsule by mouth daily. What changed: how much to take   warfarin 5 MG tablet Commonly known as: COUMADIN Take as directed. If you are unsure how to take this medication, talk to your nurse or doctor. Original instructions: TAKE ONE TABLET BY MOUTH EVERY EVENING EXCEPT 1/2 TABLET ON MONDAYS OR AS DIRECTED What changed:  how much to take how to take this when to take this additional instructions        Follow-up Information     [facility] Follow up.   Specialty: Cardiology Why: Dr. Claris Gladden Office for hospital follow-up  12/1 at 10:00 AM [facility], [place] information: [address] [id number] [place]        Vira Agar, MD. Call.   Specialty: Urology Why: please Call to  make an appointment in 4 weeks Contact information: [place] [place] [address] [phone]                No Known Allergies  Consultations: CHF team Urology   Procedures/Studies: MR ABDOMEN W WO CONTRAST  Result Date: 05/19/2022 CLINICAL DATA:  Evaluate right kidney mass. EXAM: MRI ABDOMEN WITHOUT AND WITH CONTRAST TECHNIQUE: Multiplanar multisequence MR  imaging of the abdomen was performed both before and after the administration of intravenous contrast. CONTRAST:  101m GADAVIST GADOBUTROL 1 MMOL/ML IV SOLN COMPARISON:  CT AP 05/15/22. FINDINGS: Note: Significantly diminished exam detail due to motion artifact. Lower chest: Small right pleural effusion identified. Cardiac enlargement. Hepatobiliary: Markedly diminished exam detail due to motion artifact. No focal liver abnormality identified. Small stones noted within the dependent portion of the gallbladder. The common bile duct measures 6 mm in diameter. No significant intrahepatic bile duct dilatation. Pancreas:  The pancreas is grossly unremarkable. Spleen:  Within normal limits in size and appearance. Adrenals/Urinary Tract:  Normal appearance of the adrenal glands. Arising off the inferior pole of the left kidney is a large, complex, mixed signal intensity mass which measures 9.8 x 7.7 cm, image 28/902.  Previously 9.6 by 8.6 cm. On the. Pre contrast sequences this exhibits a thick peripheral rim of increased T1 signal with heterogeneous internal areas of increased and decreased signal intensity compatible with a hemorrhagic lesion. Areas of several internal areas of T1 hyperintense internal septation identified. The subtraction images are little diagnostic value given the presence of motion artifact. Visually, no large enhancing components associated with this structure though presence of hemorrhagic material may obscure underlying areas of small enhancing nodularity or septation. The right kidney appears normal. No signs of hydronephrosis bilaterally. Stomach/Bowel: Visualized portions within the abdomen are unremarkable. Vascular/Lymphatic: No pathologically enlarged lymph nodes identified. No abdominal aortic aneurysm demonstrated. Other:  No significant free fluid. Musculoskeletal: No suspicious bone lesions identified. IMPRESSION: 1. Exam detail is significantly diminished due to motion artifact. 2.  Arising off the inferior pole of the left kidney is a large, complex, mixed signal intensity mass which measures 9.8 x 7.7 cm. This exhibits a thick peripheral rim of increased T1 signal with several internal areas of T1 hyperintense internal septation. Internally, this is predominantly T1 hyperintense with some areas of T1 hypointensity. Visually, no large enhancing components associated with this structure though presence of hemorrhagic material may obscure underlying areas of small enhancing nodularity or septation. This is favored to represent a large hemorrhagic cyst, but is incompletely characterized due to motion artifact on today's exam. Follow-up imaging with repeat renal protocol MRI or CT in 3-6 months is recommended. 3. Cholelithiasis. 4. Small right pleural effusion. Electronically Signed   By: Kerby Moors M.D.   On: 05/19/2022 08:30   CT HEAD WO CONTRAST (5MM)  Result Date: 05/16/2022 CLINICAL DATA:  Altered mental status EXAM: CT HEAD WITHOUT CONTRAST TECHNIQUE: Contiguous axial images were obtained from the base of the skull through the vertex without intravenous contrast. RADIATION DOSE REDUCTION: This exam was performed according to the departmental dose-optimization program which includes automated exposure control, adjustment of the mA and/or kV according to patient size and/or use of iterative reconstruction technique. COMPARISON:  07/03/2016 FINDINGS: Brain: No evidence of acute infarction, hemorrhage, hydrocephalus, extra-axial collection or mass lesion/mass effect. Mild cerebral volume loss and white matter low-density for age. Vascular: No hyperdense vessel or unexpected calcification. Skull: Normal. Negative for fracture or focal lesion. Sinuses/Orbits: No acute finding. IMPRESSION: Aging brain without acute or reversible finding. Electronically Signed   By: Jorje Guild M.D.   On: 05/16/2022 10:57   CT Renal Stone Study  Result Date: 05/15/2022 CLINICAL DATA:   Abdominal/flank pain, stone suspected. EXAM: CT ABDOMEN AND PELVIS WITHOUT CONTRAST TECHNIQUE: Multidetector CT imaging of the abdomen and pelvis was performed following the standard protocol without IV contrast. RADIATION DOSE REDUCTION: This exam was performed according to the departmental dose-optimization program which includes automated exposure control, adjustment of the mA and/or kV according to patient size and/or use of iterative reconstruction technique. COMPARISON:  None Available. FINDINGS: Lower chest: Nodular infiltrate within the a visualized left lung base may be related to infection or aspiration. No pleural effusion. Moderate cardiomegaly noted. Small pericardial effusion. Hepatobiliary: Cholelithiasis noted without pericholecystic inflammatory change identified. Liver unremarkable. No intra or extrahepatic biliary ductal dilation. Pancreas: Unremarkable Spleen: Unremarkable Adrenals/Urinary Tract: The adrenal glands are unremarkable. The kidneys are normal in size and position. A a exophytic heterogeneously hyperdense mass arises from the lower pole of the left kidney measuring 8.6 x 9.6 x 0.5 cm in greatest dimension which is not well characterized on this noncontrast examination. This may represent a hemorrhagic neoplasm or,  less likely, a complex cyst with areas of recurrent intracystic hemorrhage. No hydronephrosis. No intrarenal or ureteral calculi. The bladder is unremarkable. Stomach/Bowel: The stomach, small bowel, and large bowel are unremarkable save for moderate colonic stool burden. No evidence of obstruction or focal inflammation. No free intraperitoneal gas or fluid. Vascular/Lymphatic: Aortic atherosclerosis. No enlarged abdominal or pelvic lymph nodes. Reproductive: Status post hysterectomy. No adnexal masses. Other: Small fat containing right superior lumbar hernia. Musculoskeletal: No acute bone abnormality. No lytic or blastic bone lesion. Degenerative changes noted in the  lumbar spine at L4-5 with associated grade 1 anterolisthesis. IMPRESSION: 1. 9.6 cm exophytic heterogeneously hyperdense mass arising from the lower pole of the left kidney, not well characterized on this noncontrast examination. This may represent a hemorrhagic neoplasm or, less likely, a complex cyst with areas of recurrent intracystic hemorrhage. Dedicated renal mass protocol CT or MRI examination is recommended for further evaluation. 2. Nodular infiltrate within the visualized left lung base, possibly related to infection or aspiration. 3. Moderate cardiomegaly. Small pericardial effusion. 4. Cholelithiasis. 5. Moderate colonic stool burden. 6. Aortic atherosclerosis. Aortic Atherosclerosis (ICD10-I70.0). Electronically Signed   By: Fidela Salisbury M.D.   On: 05/15/2022 04:37   DG Chest 2 View  Result Date: 05/15/2022 CLINICAL DATA:  Coughing and weakness. EXAM: CHEST - 2 VIEW COMPARISON:  PA Lat 08/21/2013. FINDINGS: Severe global cardiomegaly, increased. There is prominence of the central pulmonary arteries and veins, mild interstitial edema in the lower lung fields with trace pleural effusions. Slight thickening of the horizontal fissure on the right is again shown, unchanged There are bilateral infrahilar hazy opacities which could be due to atelectasis, ground-glass edema or pneumonitis. The remaining lungs are generally clear. There is aortic tortuosity and calcification with stable mediastinum. Thoracic spondylosis and mild thoracic kyphosis are again shown. Mild osteopenia. IMPRESSION: 1. Severe cardiomegaly, increased. 2. Interstitial edema in the lower lung fields with trace pleural effusions. 3. Bilateral infrahilar hazy opacities which could be due to atelectasis, ground-glass edema or pneumonitis. 4. Aortic atherosclerosis and tortuosity. Electronically Signed   By: Telford Nab M.D.   On: 05/15/2022 01:09      Subjective: Patient denies any complaints today, she is eager to go  home.  Discharge Exam: Vitals:   05/19/22 1000 05/19/22 1241  BP:  (!) 91/59  Pulse:  70  Resp:  18  Temp:  98 F (36.7 C)  SpO2: 95%    Vitals:   05/19/22 0500 05/19/22 0800 05/19/22 1000 05/19/22 1241  BP:  111/71  (!) 91/59  Pulse:  79  70  Resp:  18  18  Temp:  98.3 F (36.8 C)  98 F (36.7 C)  TempSrc:  Oral  Oral  SpO2:  94% 95%   Weight: 62.2 kg     Height:        General: Pt is alert, awake, not in acute distress Cardiovascular: RRR, S1/S2 +, no rubs, no gallops Respiratory: CTA bilaterally, no wheezing, no rhonchi Abdominal: Soft, NT, ND, bowel sounds + Extremities: no edema, no cyanosis    The results of significant diagnostics from this hospitalization (including imaging, microbiology, ancillary and laboratory) are listed below for reference.     Microbiology: Recent Results (from the past 240 hour(s))  Blood culture (routine x 2)     Status: None (Preliminary result)   Collection Time: 05/15/22  1:14 AM   Specimen: BLOOD  Result Value Ref Range Status   Specimen Description BLOOD RIGHT ANTECUBITAL  Final  Special Requests   Final    BOTTLES DRAWN AEROBIC AND ANAEROBIC Blood Culture adequate volume   Culture   Final    NO GROWTH 4 DAYS Performed at [facility], [place] [address] [place], [place] [address]    Report Status PENDING  Incomplete  Blood culture (routine x 2)     Status: None (Preliminary result)   Collection Time: 05/15/22  1:19 AM   Specimen: BLOOD  Result Value Ref Range Status   Specimen Description BLOOD BLOOD RIGHT WRIST  Final   Special Requests   Final    BOTTLES DRAWN AEROBIC ONLY Blood Culture results may not be optimal due to an inadequate volume of blood received in culture bottles   Culture   Final    NO GROWTH 4 DAYS Performed at [facility], [place] [address], [place], [place] [address]    Report Status PENDING  Incomplete  Resp Panel by RT-PCR (Flu A&B, Covid) Anterior Nasal Swab     Status: Abnormal    Collection Time: 05/15/22  1:25 AM   Specimen: Anterior Nasal Swab  Result Value Ref Range Status   SARS Coronavirus 2 by RT PCR NEGATIVE NEGATIVE Final    Comment: (NOTE) SARS-CoV-2 target nucleic acids are NOT DETECTED.  The SARS-CoV-2 RNA is generally detectable in upper respiratory specimens during the acute phase of infection. The lowest concentration of SARS-CoV-2 viral copies this assay can detect is 138 copies/mL. A negative result does not preclude SARS-Cov-2 infection and should not be used as the sole basis for treatment or other patient management decisions. A negative result may occur with  improper specimen collection/handling, submission of specimen other than nasopharyngeal swab, presence of viral mutation(s) within the areas targeted by this assay, and inadequate number of viral copies(<138 copies/mL). A negative result must be combined with clinical observations, patient history, and epidemiological information. The expected result is Negative.  Fact Sheet for Patients:  EntrepreneurPulse.com.au  Fact Sheet for Healthcare Providers:  IncredibleEmployment.be  This test is no t yet approved or cleared by the [place] FDA and  has been authorized for detection and/or diagnosis of SARS-CoV-2 by FDA under an Emergency Use Authorization (EUA). This EUA will remain  in effect (meaning this test can be used) for the duration of the COVID-19 declaration under Section 564(b)(1) of the Act, 21 U.S.C.section 360bbb-3(b)(1), unless the authorization is terminated  or revoked sooner.       Influenza A by PCR POSITIVE (A) NEGATIVE Final   Influenza B by PCR NEGATIVE NEGATIVE Final    Comment: (NOTE) The Xpert Xpress SARS-CoV-2/FLU/RSV plus assay is intended as an aid in the diagnosis of influenza from Nasopharyngeal swab specimens and should not be used as a sole basis for treatment. Nasal washings and aspirates are unacceptable for  Xpert Xpress SARS-CoV-2/FLU/RSV testing.  Fact Sheet for Patients: EntrepreneurPulse.com.au  Fact Sheet for Healthcare Providers: IncredibleEmployment.be  This test is not yet approved or cleared by the [place] FDA and has been authorized for detection and/or diagnosis of SARS-CoV-2 by FDA under an Emergency Use Authorization (EUA). This EUA will remain in effect (meaning this test can be used) for the duration of the COVID-19 declaration under Section 564(b)(1) of the Act, 21 U.S.C. section 360bbb-3(b)(1), unless the authorization is terminated or revoked.  Performed at [facility], [place] [address], [place],  [address]   Urine Culture     Status: Abnormal   Collection Time: 05/15/22  2:32 AM  Specimen: Urine, Clean Catch  Result Value Ref Range Status   Specimen Description URINE, CLEAN CATCH  Final   Special Requests NONE  Final   Culture (A)  Final    <10,000 COLONIES/mL INSIGNIFICANT GROWTH Performed at [facility], [address], [place], [place] [address]    Report Status 05/16/2022 FINAL  Final     Labs: BNP (last 3 results) Recent Labs    11/11/21 1415 03/31/22 1441 05/15/22 0023  BNP 477.3* 1,091.3* 768.0*   Basic Metabolic Panel: Recent Labs  Lab 05/16/22 0217 05/17/22 0342 05/18/22 0308 05/19/22 0315 05/19/22 0316 05/19/22 1250  NA 140 140 144  --  142 140  K 3.7 3.0* 3.1*  --  2.7* 4.3  CL 107 107 110  --  104 105  CO2 _0 --  27 25  GLUCOSE 99 83 88  --  87 111*  BUN 26* 21 18  --  14 14  CREATININE 2.00* 1.56* 1.47*  --  1.18* 1.22*  CALCIUM 8.5* 8.3* 8.2*  --  8.3* 8.3*  MG  --   --   --  1.7  --   --   PHOS  --  2.6  --   --   --   --    Liver Function Tests: Recent Labs  Lab 05/15/22 0023 05/15/22 0855  AST 29 25  ALT 11 8  ALKPHOS 34* 28*  BILITOT 0.6 0.5  PROT 7.2 5.6*  ALBUMIN 2.9* 2.2*   No results for input(s): "LIPASE", "AMYLASE" in the last 168  hours. No results for input(s): "AMMONIA" in the last 168 hours. CBC: Recent Labs  Lab 05/15/22 0023 05/15/22 1315 05/16/22 0217 05/17/22 0342 05/18/22 0308 05/19/22 0316  WBC 8.1 6.1 5.6 5.0 4.1 3.5*  NEUTROABS 5.6  --   --   --   --   --   HGB 9.6* 9.5* 8.8* 8.5* 9.0* 8.7*  HCT 31.0* 31.3* 27.8* 27.5* 28.6* 27.3*  MCV 97.8 99.7 96.2 95.2 95.0 94.8  PLT 211 204 206 213 208 197   Cardiac Enzymes: No results for input(s): "CKTOTAL", "CKMB", "CKMBINDEX", "TROPONINI" in the last 168 hours. BNP: Invalid input(s): "POCBNP" CBG: Recent Labs  Lab 05/15/22 0037  GLUCAP 89   D-Dimer No results for input(s): "DDIMER" in the last 72 hours. Hgb A1c No results for input(s): "HGBA1C" in the last 72 hours. Lipid Profile No results for input(s): "CHOL", "HDL", "LDLCALC", "TRIG", "CHOLHDL", "LDLDIRECT" in the last 72 hours. Thyroid function studies No results for input(s): "TSH", "T4TOTAL", "T3FREE", "THYROIDAB" in the last 72 hours.  Invalid input(s): "FREET3" Anemia work up Recent Labs    05/17/22 0342  VITAMINB12 269   Urinalysis    Component Value Date/Time   COLORURINE YELLOW 05/15/2022 0032   APPEARANCEUR HAZY (A) 05/15/2022 0032   LABSPEC 1.009 05/15/2022 0032   PHURINE 5.0 05/15/2022 0032   GLUCOSEU NEGATIVE 05/15/2022 0032   HGBUR MODERATE (A) 05/15/2022 0032   BILIRUBINUR NEGATIVE 05/15/2022 0032   KETONESUR NEGATIVE 05/15/2022 0032   PROTEINUR 100 (A) 05/15/2022 0032   NITRITE NEGATIVE 05/15/2022 0032   LEUKOCYTESUR LARGE (A) 05/15/2022 0032   Sepsis Labs Recent Labs  Lab 05/16/22 0217 05/17/22 0342 05/18/22 0308 05/19/22 0316  WBC 5.6 5.0 4.1 3.5*   Microbiology Recent Results (from the past 240 hour(s))  Blood culture (routine x 2)     Status: None (Preliminary result)   Collection Time: 05/15/22  1:14 AM  Specimen: BLOOD  Result Value Ref Range Status   Specimen Description BLOOD RIGHT ANTECUBITAL  Final   Special Requests   Final    BOTTLES  DRAWN AEROBIC AND ANAEROBIC Blood Culture adequate volume   Culture   Final    NO GROWTH 4 DAYS Performed at [facility], [address] [place], [place] [address]    Report Status PENDING  Incomplete  Blood culture (routine x 2)     Status: None (Preliminary result)   Collection Time: 05/15/22  1:19 AM   Specimen: BLOOD  Result Value Ref Range Status   Specimen Description BLOOD BLOOD RIGHT WRIST  Final   Special Requests   Final    BOTTLES DRAWN AEROBIC ONLY Blood Culture results may not be optimal due to an inadequate volume of blood received in culture bottles   Culture   Final    NO GROWTH 4 DAYS Performed at [facility], [place] [address], [place], [place] [address]    Report Status PENDING  Incomplete  Resp Panel by RT-PCR (Flu A&B, Covid) Anterior Nasal Swab     Status: Abnormal   Collection Time: 05/15/22  1:25 AM   Specimen: Anterior Nasal Swab  Result Value Ref Range Status   SARS Coronavirus 2 by RT PCR NEGATIVE NEGATIVE Final    Comment: (NOTE) SARS-CoV-2 target nucleic acids are NOT DETECTED.  The SARS-CoV-2 RNA is generally detectable in upper respiratory specimens during the acute phase of infection. The lowest concentration of SARS-CoV-2 viral copies this assay can detect is 138 copies/mL. A negative result does not preclude SARS-Cov-2 infection and should not be used as the sole basis for treatment or other patient management decisions. A negative result may occur with  improper specimen collection/handling, submission of specimen other than nasopharyngeal swab, presence of viral mutation(s) within the areas targeted by this assay, and inadequate number of viral copies(<138 copies/mL). A negative result must be combined with clinical observations, patient history, and epidemiological information. The expected result is Negative.  Fact Sheet for Patients:  EntrepreneurPulse.com.au  Fact Sheet for Healthcare Providers:   IncredibleEmployment.be  This test is no t yet approved or cleared by the [place] FDA and  has been authorized for detection and/or diagnosis of SARS-CoV-2 by FDA under an Emergency Use Authorization (EUA). This EUA will remain  in effect (meaning this test can be used) for the duration of the COVID-19 declaration under Section 564(b)(1) of the Act, 21 U.S.C.section 360bbb-3(b)(1), unless the authorization is terminated  or revoked sooner.       Influenza A by PCR POSITIVE (A) NEGATIVE Final   Influenza B by PCR NEGATIVE NEGATIVE Final    Comment: (NOTE) The Xpert Xpress SARS-CoV-2/FLU/RSV plus assay is intended as an aid in the diagnosis of influenza from Nasopharyngeal swab specimens and should not be used as a sole basis for treatment. Nasal washings and aspirates are unacceptable for Xpert Xpress SARS-CoV-2/FLU/RSV testing.  Fact Sheet for Patients: EntrepreneurPulse.com.au  Fact Sheet for Healthcare Providers: IncredibleEmployment.be  This test is not yet approved or cleared by the [place] FDA and has been authorized for detection and/or diagnosis of SARS-CoV-2 by FDA under an Emergency Use Authorization (EUA). This EUA will remain in effect (meaning this test can be used) for the duration of the COVID-19 declaration under Section 564(b)(1) of the Act, 21 U.S.C. section 360bbb-3(b)(1), unless the authorization is terminated or revoked.  Performed at [facility], [place] [address] [place], [place]  [address]   Urine Culture     Status: Abnormal   Collection Time: 05/15/22  2:32 AM   Specimen: Urine, Clean Catch  Result Value Ref Range Status   Specimen Description URINE, CLEAN CATCH  Final   Special Requests NONE  Final   Culture (A)  Final    <10,000 COLONIES/mL INSIGNIFICANT GROWTH Performed at [facility], [address] [place], [place] [address]    Report Status 05/16/2022 FINAL   Final     Time coordinating discharge: Over 30 minutes  SIGNED:   Phillips Climes, MD  [facility] 05/19/2022, 3:38 PM Pager   If 7PM-7AM, please contact night-coverage [email or web address] Password [email or web address]

## 2022-05-19 NOTE — Progress Notes (Signed)
Mobility Specialist Progress Note:   05/19/22 0915  Mobility  Activity Transferred from bed to chair  Level of Assistance Contact guard assist, steadying assist  Assistive Device Other (Comment) (HHA)  Distance Ambulated (ft) 5 ft  Activity Response Tolerated well  Mobility Referral Yes  $Mobility charge 1 Mobility   Pt agreeable to mobility session, however requesting to transfer to chair for breakfast. Pt left with all needs met. Will f/u for ambulation after breakfast.  [place] Specialist Please contact via SecureChat or  Rehab office at [phone]

## 2022-05-19 NOTE — Progress Notes (Signed)
Mobility Specialist Progress Note:   05/19/22 1025  Mobility  Activity Ambulated with assistance in hallway  Level of Assistance Contact guard assist, steadying assist  Assistive Device Front wheel walker  Distance Ambulated (ft) 200 ft  Activity Response Tolerated well  $Mobility charge 1 Mobility   Pt agreeable to mobility session, however limited by nausea. No physical assistance required. Pt left in bed with all needs met, bed alarm on.   Nelta Numbers Mobility Specialist Please contact via SecureChat or  Rehab office at [phone]

## 2022-05-19 NOTE — Progress Notes (Addendum)
ANTICOAGULATION CONSULT NOTE - Follow Up Consult  Pharmacy Consult for warfarin Indication: atrial fibrillation  No Known Allergies  Patient Measurements: Height: _0  (157.5 cm) Weight: 62.2 kg (137 lb 2 oz) IBW/kg (Calculated) : 50.1  Vital Signs: Temp: 97.9 F (36.6 C) (11/21 0402) Temp Source: Oral (11/21 0402) BP: 114/66 (11/21 0402) Pulse Rate: 71 (11/21 0419)  Labs: Recent Labs    05/17/22 0342 05/18/22 0308 05/19/22 0316  HGB 8.5* 9.0* 8.7*  HCT 27.5* 28.6* 27.3*  PLT 213 208 197  LABPROT 29.0* 29.8* 33.4*  INR 2.8* 2.9* 3.3*  CREATININE 1.56* 1.47* 1.18*     Estimated Creatinine Clearance: 32.4 mL/min (A) (by C-G formula based on SCr of 1.18 mg/dL (H)).   Medications:  Scheduled:   busPIRone  5 mg Oral q morning   cholecalciferol  1,000 Units Oral Daily   cyanocobalamin  1,000 mcg Intramuscular Daily   docusate sodium  100 mg Oral BID   macitentan  10 mg Oral Daily   oseltamivir  30 mg Oral Daily   potassium chloride  40 mEq Oral Q4H   rosuvastatin  20 mg Oral QHS   tadalafil  40 mg Oral Daily   Tafamidis  1 capsule Oral Daily   torsemide  30 mg Oral BID   Treprostinil  54 mcg Inhalation QID   Warfarin - Pharmacist Dosing Inpatient   Does not apply q1600    Assessment: 81 YO F PMH of chronic atrial fibrillation and is on PTA warfarin. PTA dose is Warfarin 5 mg by mouth every evening except on Mondays 2.5 mg.   INR today came back supratherapeutic at 3.3. Warfarin had been held until restart on 11/19 - of note, on azithromycin which can impact warfarin sensitivity - stopped yesterday. No s/sx of bleeding noted. Hgb 8.7, plt 197- stable.   Goal of Therapy:  INR 2-3 Monitor platelets by anticoagulation protocol: Yes   Plan:  Hold warfarin tonight  Monitor daily CBC, INR Monitor signs and symptoms of bleeding At discharge: Hold dose on 05/19/2022, restart 2.5 mg (1/2 tablet) on 05/20/2022 and 05/21/2022, then resume normal PTA dosing on  05/22/2022 with close INR check   Antonietta Jewel, PharmD, [place] Pharmacist  Phone: [phone] 05/19/2022 7:54 AM  Please check AMION for all [facility] phone numbers After 10:00 PM, call [place] [phone]

## 2022-05-19 NOTE — Discharge Instructions (Signed)
Follow with Primary MD Deland Pretty, MD in 10 days   Get CBC, CMP,  checked  by Primary MD next visit.    Activity: As tolerated with Full fall precautions use walker/cane & assistance as needed   Disposition Home    Diet: Heart Healthy   For Heart failure patients - Check your Weight same time everyday, if you gain over 2 pounds, or you develop in leg swelling, experience more shortness of breath or chest pain, call your Primary MD immediately. Follow Cardiac Low Salt Diet and 1.5 lit/day fluid restriction.   On your next visit with your primary care physician please Get Medicines reviewed and adjusted.   Please request your Prim.MD to go over all Hospital Tests and Procedure/Radiological results at the follow up, please get all Hospital records sent to your Prim MD by signing hospital release before you go home.   If you experience worsening of your admission symptoms, develop shortness of breath, life threatening emergency, suicidal or homicidal thoughts you must seek medical attention immediately by calling 911 or calling your MD immediately  if symptoms less severe.  You Must read complete instructions/literature along with all the possible adverse reactions/side effects for all the Medicines you take and that have been prescribed to you. Take any new Medicines after you have completely understood and accpet all the possible adverse reactions/side effects.   Do not drive, operating heavy machinery, perform activities at heights, swimming or participation in water activities or provide baby sitting services if your were admitted for syncope or siezures until you have seen by Primary MD or a Neurologist and advised to do so again.  Do not drive when taking Pain medications.    Do not take more than prescribed Pain, Sleep and Anxiety Medications  Special Instructions: If you have smoked or chewed Tobacco  in the last 2 yrs please stop smoking, stop any regular Alcohol  and or any  Recreational drug use.  Wear Seat belts while driving.   Please note  You were cared for by a hospitalist during your hospital stay. If you have any questions about your discharge medications or the care you received while you were in the hospital after you are discharged, you can call the unit and asked to speak with the hospitalist on call if the hospitalist that took care of you is not available. Once you are discharged, your primary care physician will handle any further medical issues. Please note that NO REFILLS for any discharge medications will be authorized once you are discharged, as it is imperative that you return to your primary care physician (or establish a relationship with a primary care physician if you do not have one) for your aftercare needs so that they can reassess your need for medications and monitor your lab values.

## 2022-05-19 NOTE — Progress Notes (Addendum)
Patient ID: Vicki [name], female   DOB: Oct 20, 1940, 81 y.o.   MRN: [id number]     Advanced Heart Failure Rounding Note  PCP-Cardiologist: None   Subjective:    Breathing improved w/ treatment of flu. No current supp O2 requirements.   AKI resolved. SCr 2.6 on admit>>1.2 today. Back on home torsemide.   K 2.7. K supp ordered  Mg pending   Afib rate controlled, 70s (chronic). Getting coumadin INR 3.3   Getting Opsumit, tadalafil and tafamidis here. Family has not brought in Tyvaso from home.    MR of abdomen to assess left renal mass completed yesterday. Results pending.    Objective:   Weight Range: 62.2 kg Body mass index is 25.08 kg/m.   Vital Signs:   Temp:  [97.5 F (36.4 C)-98.2 F (36.8 C)] 97.9 F (36.6 C) (11/21 0402) Pulse Rate:  [71-80] 71 (11/21 0419) Resp:  [17] 17 (11/21 0402) BP: (101-114)/(63-74) 114/66 (11/21 0402) SpO2:  [91 %-95 %] 95 % (11/21 0419) Weight:  [62.2 kg] 62.2 kg (11/21 0500) Last BM Date : 05/18/22  Weight change: Filed Weights   05/16/22 0500 05/18/22 0500 05/19/22 0500  Weight: 61.4 kg 61 kg 62.2 kg    Intake/Output:  No intake or output data in the 24 hours ending 05/19/22 1470     Physical Exam   General:  Well appearing elderly female. No respiratory difficulty HEENT: normal Neck: supple. no JVD. Carotids 2+ bilat; no bruits. No lymphadenopathy or thyromegaly appreciated. Cor: PMI nondisplaced. Irregularly irregular rhythm and rate. No rubs, gallops or murmurs. Lungs: clear Abdomen: soft, nontender, nondistended. No hepatosplenomegaly. No bruits or masses. Good bowel sounds. Extremities: no cyanosis, clubbing, rash, edema Neuro: alert & oriented x 3, cranial nerves grossly intact. moves all 4 extremities w/o difficulty. Affect pleasant.    Telemetry   Atrial fibrillation 70s (personally reviewed)   Labs    CBC Recent Labs    05/18/22 0308 05/19/22 0316  WBC 4.1 3.5*  HGB 9.0* 8.7*  HCT 28.6* 27.3*  MCV  95.0 94.8  PLT 208 929   Basic Metabolic Panel Recent Labs    05/17/22 0342 05/18/22 0308 05/19/22 0316  NA 140 144 142  K 3.0* 3.1* 2.7*  CL 107 110 104  CO2 _0 GLUCOSE 83 88 87  BUN _1 CREATININE 1.56* 1.47* 1.18*  CALCIUM 8.3* 8.2* 8.3*  PHOS 2.6  --   --    Liver Function Tests No results for input(s): "AST", "ALT", "ALKPHOS", "BILITOT", "PROT", "ALBUMIN" in the last 72 hours. No results for input(s): "LIPASE", "AMYLASE" in the last 72 hours. Cardiac Enzymes No results for input(s): "CKTOTAL", "CKMB", "CKMBINDEX", "TROPONINI" in the last 72 hours.  BNP: BNP (last 3 results) Recent Labs    11/11/21 1415 03/31/22 1441 05/15/22 0023  BNP 477.3* 1,091.3* 767.4*    ProBNP (last 3 results) No results for input(s): "PROBNP" in the last 8760 hours.   D-Dimer No results for input(s): "DDIMER" in the last 72 hours. Hemoglobin A1C No results for input(s): "HGBA1C" in the last 72 hours. Fasting Lipid Panel No results for input(s): "CHOL", "HDL", "LDLCALC", "TRIG", "CHOLHDL", "LDLDIRECT" in the last 72 hours. Thyroid Function Tests No results for input(s): "TSH", "T4TOTAL", "T3FREE", "THYROIDAB" in the last 72 hours.  Invalid input(s): "FREET3"   Other results:   Imaging    No results found.   Medications:     Scheduled Medications:  busPIRone  5 mg Oral q  morning   cholecalciferol  1,000 Units Oral Daily   cyanocobalamin  1,000 mcg Intramuscular Daily   docusate sodium  100 mg Oral BID   macitentan  10 mg Oral Daily   oseltamivir  30 mg Oral Daily   potassium chloride  40 mEq Oral Q4H   rosuvastatin  20 mg Oral QHS   tadalafil  40 mg Oral Daily   Tafamidis  1 capsule Oral Daily   torsemide  30 mg Oral BID   Treprostinil  54 mcg Inhalation QID   Warfarin - Pharmacist Dosing Inpatient   Does not apply q1600    Infusions:  cefTRIAXone (ROCEPHIN)  IV 1 g (05/18/22 2319)   potassium chloride      PRN  Medications:    Assessment/Plan   1. ID: Patient admitted with influenza A and possible secondary bacterial PNA.  Breathing is improving. O2 sats stable on RA. - Continue oseltamivir.  - Continue ceftriaxone/azithromycin.  2. Pulmonary HTN: Patient has severe pulmonary arterial HTN.  RHC in 3/15 showed CI 1.97, PVR 9. 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 Collagen vascular disease workup was negative (negative RF, ANA and negative anti-SCL-70).  V/Q scan was not suggestive of chronic PEs.  PFTs were suggestive of restrictive lung disease but CT chest and evaluation by pulmonary did not suggest interstitial lung disease.  Sleep study did not show OSA.  Echo in 3/22 showed normal RV size and systolic function, PASP estimation 37 mmHg.  Echo 5/23 showed EF 60-65%, severe concentric LVH, normal RV, PASP 49 mmHg, moderate MR, small pericardial effusion.  - Continue macitentan and Adcirca. Family to bring Tyvaso from home.  3. Chronic diastolic CHF: EF preserved on echo with moderate concentric LVH and a small pericardial effusion.  It is possible that the LVH is due to years of HTN.  The cardiac MRI was not definitively suggestive of cardiac amyloidosis.  I was still concerned for amyloidosis given the appearance of the LV myocardium on echoes.  Abdominal fat pad biopsy in 2015 showed no evidence for amyloidosis. She has some symptoms suggestive of mild peripheral neuropathy.  PYP scan in 10/18 was not suggestive of TTR amyloidosis. I assessed her again for cardiac amyloidosis in 2020: myeloma panel and urine immunofixation negative, PYP scan in 12/20 was grade 2 with H/CL 1.97 but activity was localized to the atria.  Inv[employer]ae gene testing for TTR amyloidosis was negative.  PYP scan repeated 9/22 was grade 2 with H/CL 1.28 => isolated atrial uptake again.  Echo in 5/23 was consistent with cardiac amyloidosis, EF 60-65%, severe concentric LVH, normal RV, PASP 49 mmHg, moderate MR, small  pericardial effusion. She does not look volume overloaded on exam.  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 This may be an earlier manifestation before LV involvement.  This can be associated with atrial fibrillation. She will continue tafamidis.   - Continue torsemide 30 mg bid, this is her home dose.  - Replace K.  4. HTN: BP controlled.  - She is on amlodipine at home, currently on hold. SBPs 110s  - may benefit from addition of spiro to help w/ hypokalemia  5. Chronic atrial fibrillation: Continue coumadin.  She is off metoprolol due to bradycardia. Holter off metoprolol did not show any high rate episodes.  6. Hyperlipidemia: On Crestor.  7. Hypokalemia: K 2.7.  - aggressive K supp - check Mg level, replete if needed - may benefit from addition  of low dose spironolactone  8. Left Renal Mass - MR of abdomen completed. Results pending   Length of Stay: [address] PA-C  05/19/2022, 7:12 AM  Advanced Heart Failure Team Pager [phone] (M-F; 7a - 5p)  Please contact [facility] for night-coverage after hours (5p -7a ) and weekends on [email or web address]   Agree with the above PA note.    No complaints today.  K is low, creatinine at baseline.  Replace K aggressively today.  I will add spironolactone 12.5 mg daily rather than starting her back on amlodipine.    She should continue her PH meds, need family to bring in Tyvaso from home.   [name] 05/19/2022

## 2022-05-19 NOTE — TOC Initial Note (Addendum)
05/25/2022 0814 late 05/19/2022 SDOH was completed with dtr.    Transition of Care [facility]) - Initial/Assessment Note    Patient Details  Name: Vicki [name] MRN: [id number] Date of Birth: 05/30/1941  [facility]) CM/SW Contact:    Erenest Rasher, RN Phone Number: [phone] 05/19/2022, 4:13 PM  Clinical Narrative:                  HF TOC CM spoke to pt and dtr, Vicki [name]. Pt will move in with dtr, Vicki [name]. States she has RW at home. Will provide transportation to her appts. Dtr has noticed increase in memory loss. She is requesting a referral to Neurologist. Message sent to attending. Dtr will call Dr Shelia Media for follow up PCP appt.  No dc needs identified.      Expected Discharge Plan: Home/Self Care Barriers to Discharge: No Barriers Identified   Patient Goals and CMS Choice Patient states their goals for this hospitalization and ongoing recovery are:: remain healthy      Expected Discharge Plan and Services Expected Discharge Plan: Home/Self Care   Discharge Planning Services: CM Consult   Living arrangements for the past 2 months: Single Family Home Expected Discharge Date: 05/19/22                                    Prior Living Arrangements/Services Living arrangements for the past 2 months: Single Family Home Lives with:: Adult Children Patient language and need for interpreter reviewed:: Yes Do you feel safe going back to the place where you live?: Yes      Need for Family Participation in Patient Care: Yes (Comment) Care giver support system in place?: Yes (comment) Current home services: DME ([facility]) Criminal Activity/Legal Involvement Pertinent to Current Situation/Hospitalization: No - Comment as needed  Activities of Daily Living      Permission Sought/Granted Permission sought to share information with : Case Manager, Family Supports, PCP Permission granted to share information with : Yes, Verbal Permission  Granted  Share Information with NAME: Vicki [name]     Permission granted to share info w Relationship: daughter  Permission granted to share info w Contact Information: [phone]  Emotional Assessment Appearance:: Appears stated age   Affect (typically observed): Pleasant Orientation: : Oriented to Self   Psych Involvement: No (comment)  Admission diagnosis:  Renal cyst [N28.1] Influenza A with pneumonia [J09.X1] Acute cystitis without hematuria [N30.00] AKI (acute kidney injury) ([place]) [N17.9] Influenza [J11.1] Community acquired pneumonia, unspecified laterality [J18.9] Patient Active Problem List   Diagnosis Date Noted   Influenza A with pneumonia 05/15/2022   Chronic respiratory failure with hypoxia -baseline 4L/min 05/15/2022   Cardiac amyloidosis ([place]) 05/15/2022   AKI (acute kidney injury) ([place]) 05/15/2022   Left renal mass 05/15/2022   Elevated troponin 05/15/2022   Hyperlipemia 08/05/2021   Encounter for therapeutic drug monitoring 09/22/2013   Bradycardia 08/30/2013   Syncope 08/21/2013   Abnormal CT of the chest 03/14/2013   Chronic diastolic CHF (congestive heart failure) ([place]) 50/93/2671   Acute diastolic CHF (congestive heart failure) ([place]) 11/18/2012   Acute on chronic diastolic heart failure ([place]) 11/18/2012   Acute cor pulmonale ([place]) 11/18/2012   Pulmonary hypertension ([place]) 10/26/2012   Atrial fibrillation ([place]) 10/26/2012   Thrombocytopenia ([place]) 06/13/2012   PCP:  Deland Pretty, MD Pharmacy:   [place], [place] - [place]  [address] [place] [address] Phone: [phone] Fax: [phone]  [facility] ([place]) [place], [place] [address] Phone: [phone] Fax: [phone]  [place], [place] [address] Phone: [phone] Fax:  [phone]  [place], [place] [address] Phone: [phone] Fax: [place], [place] [address] [place] [address] Phone: [phone] Fax: [place], [place] [address] Phone: [phone] Fax: [phone]  [facility] - [place], [place] - [address] [place] [address] Phone: [phone] Fax: [place]. [place] [address] Phone: [phone] Fax: [phone]  [facility] [place] [address] [place] [address] Phone: [phone] Fax: [phone]     Social Determinants of Health (SDOH) Interventions    Readmission Risk Interventions     No data to display

## 2022-05-20 LAB — CULTURE, BLOOD (ROUTINE X 2)
Culture: NO GROWTH
Culture: NO GROWTH
Special Requests: ADEQUATE

## 2022-05-22 ENCOUNTER — Other Ambulatory Visit (HOSPITAL_COMMUNITY): Payer: Self-pay

## 2022-05-26 DIAGNOSIS — I509 Heart failure, unspecified: Secondary | ICD-10-CM | POA: Diagnosis not present

## 2022-05-28 DIAGNOSIS — I482 Chronic atrial fibrillation, unspecified: Secondary | ICD-10-CM | POA: Diagnosis not present

## 2022-05-28 DIAGNOSIS — I130 Hypertensive heart and chronic kidney disease with heart failure and stage 1 through stage 4 chronic kidney disease, or unspecified chronic kidney disease: Secondary | ICD-10-CM | POA: Diagnosis not present

## 2022-05-28 DIAGNOSIS — N1831 Chronic kidney disease, stage 3a: Secondary | ICD-10-CM | POA: Diagnosis not present

## 2022-05-28 DIAGNOSIS — I5032 Chronic diastolic (congestive) heart failure: Secondary | ICD-10-CM | POA: Diagnosis not present

## 2022-05-28 NOTE — Progress Notes (Signed)
Date:  05/29/2022   ID:  Vicki [name], DOB May 17, 1941, MRN [id number]   Provider location: [facility] Type of Visit: Established patient   PCP:  Deland Pretty, MD  HF Cardiologist:  Dr. Aundra Dubin   HPI: Vicki [name] is a 81 y.o. female who has a history of chronic diastolic CHF, pulmonary hypertension and chronic atrial fibrillation.  Patient was followed by Dr. Glade Lloyd in the past for chronic atrial fibrillation.  She has been on coumadin.  She reports progressive exertional dyspnea since 2011.  This gradually worsened and became quite significant over the last few months.  She used to have significant HTN, but more recently her BP has been on the lower side.  Echo was done in 2/14, showing severe concentric LVH with EF 55-60%, moderately dilated RV, moderate to severe TR, and PA systolic pressure 86 mmHg.  I did a right heart cath in 5/14.  This showed PA pressure 104/36 with PCWP 20, suggesting pulmonary arterial HTN well out of proportion to the mildly elevate wedge pressure.  She was already on amlodipine so I did not do vasodilator testing.  V/Q scan was done, showing no evidence for chronic PEs.  PFTs showed a restrictive defect. Cardiac MRI did not show definite evidence for amyloid.  I started her on macitentan 10 mg daily.  Initially, she felt better on macitentan.  However, she was admitted in 5/14 from her sleep study due to orthopnea and dyspnea.  She was diuresed for several days and diuretic was switched over to torsemide.  I next started her on tadalafil 20 mg daily and titrated up to 40 mg daily.  She thinks that this helped.  She saw pulmonary after chest CT (showed mosaic attenuation in lungs).  This was thought to be due to air-trapping rather than ILD.  She was started on Spiriva. She wears oxygen at home.    She had an echocardiogram in 2/15 that showed severe LVH, EF 75%, small pericardial effusion, mildly dilated RV with mildly decreased systolic  function, PA systolic pressure 84 mmHg.  She is not totally sure if she was taking macitentan and tadalafil at that time.  She has had a hard month.  She ran out of her macitentan and tadalafil and her insurance company refused to refill them.  After this, she took a decided turn for the worse.  [place] in the fall.  She became much more short of breath, just with walking around the house. She developed lightheaded spells, especially with micturation.  Given lightheadedness and presyncope, she was admitted last week.  She was restarted on her medications and she finally got back on her meds at home.  In the hospital, she was noted to be bradycardic with HR to 30s so metoprolol was stopped.  Of note, her abdominal fat pad biopsy did not suggest amyloidosis. Repeat RHC in 3/15 still with moderate to severe PAH and low CI.  Holter off beta blocker in 3/15 showed average HR 73.    Echo in 9/17 showed EF 65-70% with moderate to severe LVH, moderate MR, normal RV size with mildly decreased systolic function, PASP 56 mmHg, small pericardial effusion.    Echo in 10/18 showed EF 60-65% with moderate LVH, moderate MR, severe biatrial enlargement, PASP 62 mmHg, RV normal.  PYP scan in 10/18 was not suggestive of TTR amyloidosis.   Echo in  10/19 showed EF 65-70%, moderate MR, normal RV size and systolic function, PASP 43 mmHg.   Echo in 12/20 showed EF 65-70%, moderate LVH, severe biatrial enlargement, mild-moderate MR, small pericardial effusion, unable to estimate PA systolic pressure.   PYP scan in 12/20 was visually grade 2 with H/CL 1.96 but activity appeared localized to the atria. [employer]ae gene testing for TTR amyloidosis in 3/21 was negative.   Echo in 2/22 showed EF 60-65%, moderate LVH, normal RV, PASP 37 mmHg, moderate central MR.  PYP scan repeated 9/22 was grade 2 with H/CL 1.28 => isolated atrial uptake again.  Echo in 5/23  showed EF 60-65%, severe concentric LVH, normal RV, PASP 49 mmHg, moderate MR, small pericardial effusion.   Admitted 05/14/22 with sepsis in the setting of flu and pneumonia. Placed on Tamiflu and antibiotics. AHF consulted to help with HF/PH meds due to AKI. Discharged home, weight 137 lbs.  Today she returns for post hospital HF follow up with her daugther. Overall feeling fine. Main complaint is she has noticed her bowel movements have a green color. No abdominal pain or changes in diet. No BRBPR. Occasional dizziness when walking further distances. She is not short of breath walking short distances on flat ground. She has oxygen at home for PRN use. Denies palpitations, CP, dizziness, edema, or PND/Orthopnea. Appetite ok. No fever or chills. She does not weigh at home. Taking all medications.   ECG (personally reviewed): atrial fibrillation 72 bpm, QTc 473 msec   6 minute walk (5/14): 122 m.   6 minute walk (7/14): 152 m 6 minute walk (10/14): 183 m 6 minute walk (4/15): 317 m 6 minute walk (12/15): 259 m 6 minute walk (4/16): 293 m 6 minute walk (8/16): 341 m 6 minute walk (12/16): 274 m 6 minute walk (6/17): 314 m 6 minute walk (9/17): 307 m 6 minute walk (4/18): 366 m 6 minute walk (10/18): 320 m 6 minute walk (3/19): 381 m 6 minute walk (9/19): 366 m 6 minute walk (9/20): 305 m 6 minute walk (12/20): 243 m 6 minute walk (6/21): 304 m 6 minute walk (11/21): 323 m 6 minute walk (2/22): 305 m 6 minute walk (5/22): 366 m 6 minute walk (1/23): 366 m 6 minute walk (5/23): 314 m   Labs (12/13): HCT 45.1, plts 153, K 3.5, creatinine 1.0 Labs (1/14): BNP 849 Labs (4/14): K 3.1, creatinine 1.0, ANA and anti-SCL-70 antibody negative.  Rheumatoid factor negative.  Serum immunofixation did not show monoclonal light chains.  Labs (5/14): K 3.3, creatinine 0.79, proBNP 5147 Labs (6/14): K 4, creatinine 0.9, BNP 1001 Labs (10/14): LDL 53, LDL-P 975, K 3.7, creatinine 1.2 Labs (11/14): K  3.7, creatinine 0.9, BNP 832 Labs (2/15): K 3.5, creatinine 1.10, HCT 42.6, UPEP negative, SPEP negative.  Labs (3/15): K 3.8, creatinine 0.88 Labs (4/15): K 3.7, creatinine 0.99, proBNP 1653 Labs (7/15): K 4, creatinine 0.93 Labs (12/15): LDL 77, HDL 58, K 3.9, creatinine 1.03, LFTs normal Labs (4/16): K 3.8, creatinine 0.93, BNP 475, plts 105, HCT 42.3 Labs (11/16): K 3.6, creatinine 1.1, HCT 42.2 Labs (12/16): LDL 54, HDL 50, BNP 628 Labs (6/17): K 3.7, creatinine 0.97, HCT 41.3, BNP 489 Labs (9/17): K 3.8, creatinine 0.97, LDL 65, HDL 54 Labs (9/18): K 4.1, creatinine 1.05, hgb 14.6, BNP 646 Labs (10/18): BNP 588, TSH normal, LDL 73, HDL 48, K 3.5, creatinine 0.97, hgb 12.5, plts 91, LFTs normal Labs (3/19): K 4.1, creatinine 1.07, hgb 13.2 Labs (9/19):  K 3.8, creatinine 1.1 Labs (7/20): K 3.9, creatinine 1.3, LDL 63 Labs (9/20): K 4.2, creatinine 1.05, hgb 12.6, BNP 632  Labs (12/20): Myeloma panel negative, urine immunofixation negative.  Labs (1/21): K 4.3, creatinine 1.12, LDL 63 Labs (3/21): K 3.8, creatinine 1.13 Labs (6/21): K 3.9, creatinine 1.16 Labs (11/21): LDL 63, K 4.1, creatinine 1.19 Labs (2/22): K 4.3, creatinine 1.14, hgb 11.5, BNP 915 Labs (5/22): LDL 65 Labs (6/22): K 4, creatinine 1.16 Labs (8/22): K 3.6, creatinine 1.05, BNP 720 Labs (2/23): K 3.8, creatinine 1.26, LDL 62, BNP 623 Labs (5/23): K 3.5, creatinine 1.18, BNP 477 Labs (11/23): K 4.3, creatinine 1.22   PMH: 1. Chronic diastolic CHF: Echo (1/61) with EF 55-60%, severe LVH (no SAM, no asymmetric hypertrophy, no LVOT gradient), moderate-severe LAE, moderately dilated RV with mildly decreased systolic function, moderate to severe RAE, PA systolic pressure 86 mmHg, moderate-severe TR, moderate MR, trivial pericardial effusion.  Cardiac MRI (5/14): EF 65%, severe LVH, no definite evidence for amyloidosis (no delayed enhancement, myocardium not difficult to null).  Echo (2/15) with EF 75%, severe LVH, grade  II diastolic dysfunction, moderate MR, RV mildly dilated with mildly decreased systolic function, moderate TR, PA systolic pressure 84 mmHg, small pericardial effusion. Abdominal fat pad biopsy (2/15) showed no evidence for amyloidosis.  SPEP/UPEP negative 2/15.  - Echo (4/16) with EF 60-65%, severe LVH, RV mildly dilated with mildly decreased systolic function, PA systolic pressure 40 mmHg. - Echo (9/17) with EF 65-70%, severe LVH, moderate MR, normal RV size with mildly decreased systolic function, PASP 56 mmHg, small pericardial effusion.  - Echo (10/18): EF 60-65% with moderate LVH, moderate MR, severe biatrial enlargement, PASP 62 mmHg, RV normal. - PYP scan (10/18) was not suggestive of TTR amyloidosis.  - Echo (10/19): EF 65-70%, moderate MR, normal RV size and systolic function, PASP 43 mmHg. - Echo (12/20): EF 65-70%, moderate LVH, severe biatrial enlargement, mild-moderate MR, small pericardial effusion, unable to estimate PA systolic pressure.  - PYP scan (12/20): grade 2 with H/CL 1.96 but activity localized to the atria.  - Invitae gene testing for TTR amyloidosis in 3/21 was negative.  - Echo (2/22): EF 60-65%, moderate LVH, normal RV, PASP 37 mmHg, moderate central MR. - PYP scan (9/22): grade 2, H/CL 1.28 => isolated atrial uptake.  - Echo (5/23): EF 60-65%, severe concentric LVH, normal RV, PASP 49 mmHg, moderate MR, small pericardial effusion.  2. Chronic atrial fibrillation since around 2004.  Developed bradycardia and metoprolol stopped 2/15. Holter (3/15) with average HR 73, atrial fibrillation, 3.8 sec pause x 1 while asleep, PVCs.  3. HTN: For decades.  4. LHC (2/08) with no significant disease.  5. Chronic thrombocytopenia: ITP 6. Pulmonary arterial HTN: RHC (5/14) with mean RA 13, PA 104/36 (mean 63), mean PCWP 20 on right and 23 on left, CI 2.3 (Fick) and 1.6 (thermo), PVR 10.4 WU (Fick) and 15 WU (thermo).  Vasodilator testing not done as patient was already on amlodipine.   V/Q scan (5/14) with no evidence for chronic PE.  ANA, RF, and anti-SCL70 antibody negative.  PFTs (5/14) with FEV1 60%, FVC 54%, ratio 112%, TLC 61%, DLCO 43% => restrictive defect.  Sleep study (7/14) with no OSA.  CT chest with areas of mosaic attenuation in lungs (saw pulmonary, thought air trapping and not ILD). RHC (3/15) with RA mean 5, PA 66/31 mean 43, PCWP mean 11, Cardiac Index (Fick) 1.97, PVR 9.2 WU.  Patient was started on Tyvaso in  3/15.  Echo (4/16) with mildly dilated and mildly dysfunctional RV, PA systolic pressure 40 mmHg.  7. Chest pain: Cardiolite 11/21 with no ischemia.   Current Outpatient Medications  Medication Sig Dispense Refill   acetaminophen (TYLENOL) 500 MG tablet Take 500 mg by mouth every 6 (six) hours as needed for mild pain or headache.      busPIRone (BUSPAR) 5 MG tablet Take 5 mg by mouth every morning.     Cholecalciferol (VITAMIN D3) 2000 units TABS Take 1 tablet by mouth daily.      cyanocobalamin (VITAMIN B12) 1000 MCG tablet Take 1 tablet (1,000 mcg total) by mouth daily. 30 tablet 0   KLOR-CON M20 20 MEQ tablet TAKE TWO TABLETS BY MOUTH IN THE MORNING AND ONE IN THE EVENING (Patient taking differently: Take 40 mEq by mouth every evening.) 90 tablet 0   macitentan (OPSUMIT) 10 MG tablet TAKE 1 TABLET (10 MG TOTAL) BY MOUTH DAILY WITH BREAKFAST. (Patient taking differently: Take 10 mg by mouth daily.) 90 tablet 3   rosuvastatin (CRESTOR) 20 MG tablet Take 20 mg by mouth at bedtime.      spironolactone (ALDACTONE) 25 MG tablet Take 0.5 tablets (12.5 mg total) by mouth daily. 30 tablet 0   tadalafil, PAH, (ADCIRCA) 20 MG tablet TAKE 2 TABLETS DAILY GENERIC FOR ADCIRCA (Patient taking differently: Take 40 mg by mouth every evening.) 60 tablet 11   Tafamidis (VYNDAMAX) 61 MG CAPS Take 1 capsule by mouth daily. (Patient taking differently: Take 61 mg by mouth daily.) 90 capsule 3   torsemide (DEMADEX) 20 MG tablet Take 1.5 tablets (30 mg total) by mouth 2 (two)  times daily. 90 tablet 6   Treprostinil (TYVASO) 0.6 MG/ML SOLN Inhale 54 mcg into the lungs 4 (four) times daily. (Patient taking differently: Inhale into the lungs See admin instructions. 4 puffs by mouth 3 times daily.) 30 mL 5   warfarin (COUMADIN) 5 MG tablet TAKE ONE TABLET BY MOUTH EVERY EVENING EXCEPT 1/2 TABLET ON MONDAYS OR AS DIRECTED 90 tablet 1   levocetirizine (XYZAL) 5 MG tablet Take 2.5 mg by mouth daily in the afternoon. (Patient not taking: Reported on 05/15/2022)     No current facility-administered medications for this encounter.    Allergies:   Patient has no known allergies.   Social History:  The patient  reports that she has never smoked. She has never used smokeless tobacco. She reports current alcohol use of about 3.0 standard drinks of alcohol per week. She reports that she does not use drugs.   Family History:  The patient's family history includes Alzheimer's disease in her mother; Heart Problems in her brother and maternal aunt; Heart attack in her father; Hypertension in her brother; Pulmonary Hypertension in her cousin; Thyroid disease in her daughter, daughter, and maternal aunt.   ROS:  Please see the history of present illness.   All other systems are personally reviewed and negative.   Recent Labs: 05/15/2022: ALT 8; B Natriuretic Peptide 767.4 05/16/2022: TSH 0.814 05/19/2022: BUN 14; Creatinine, Ser 1.22; Hemoglobin 8.7; Magnesium 1.7; Platelets 197; Potassium 4.3; Sodium 140  Personally reviewed   BP 112/68   Pulse 72   Wt 60.6 kg (133 lb 9.6 oz)   SpO2 97%   BMI 24.44 kg/m   Wt Readings from Last 3 Encounters:  05/29/22 60.6 kg (133 lb 9.6 oz)  05/19/22 62.2 kg (137 lb 2 oz)  03/31/22 60.5 kg (133 lb 6.4 oz)   Physical Exam General:  NAD. No resp difficulty, walked into clinic, elderly HEENT: Normal Neck: Supple. No JVD. Carotids 2+ bilat; no bruits. No lymphadenopathy or thryomegaly appreciated. Cor: PMI nondisplaced. Irregular rate &  rhythm. No rubs, gallops or murmurs. Lungs: Clear Abdomen: Soft, nontender, nondistended. No hepatosplenomegaly. No bruits or masses. Good bowel sounds. Extremities: No cyanosis, clubbing, rash, edema Neuro: Alert & oriented x 3, cranial nerves grossly intact. Moves all 4 extremities w/o difficulty. Affect pleasant.  Assessment & Plan 1. Pulmonary HTN: Patient has severe pulmonary arterial HTN.  RHC in 3/15 showed CI 1.97, PVR 9. 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 Collagen vascular disease workup was negative (negative RF, ANA and negative anti-SCL-70).  V/Q scan was not suggestive of chronic PEs.  PFTs were suggestive of restrictive lung disease but CT chest and evaluation by pulmonary did not suggest interstitial lung disease.  Sleep study did not show OSA.  Echo in 3/22 showed normal RV size and systolic function, PASP estimation 37 mmHg.  Echo 5/23 showed EF 60-65%, severe concentric LVH, normal RV, PASP 49 mmHg, moderate MR, small pericardial effusion.  - Continue macitentan, Adcirca, and Tyvaso.  2. Chronic diastolic CHF: EF preserved on echo with moderate concentric LVH and a small pericardial effusion.  It is possible that the LVH is due to years of HTN.  The cardiac MRI was not definitively suggestive of cardiac amyloidosis.  I was still concerned for amyloidosis given the appearance of the LV myocardium on echoes.  Abdominal fat pad biopsy in 2015 showed no evidence for amyloidosis. She has some symptoms suggestive of mild peripheral neuropathy.  PYP scan in 10/18 was not suggestive of TTR amyloidosis. I assessed her again for cardiac amyloidosis in 2020: myeloma panel and urine immunofixation negative, PYP scan in 12/20 was grade 2 with H/CL 1.97 but activity was localized to the atria.  Inv[employer]ae gene testing for TTR amyloidosis was negative.  PYP scan repeated 9/22 was grade 2 with H/CL 1.28 => isolated atrial uptake again.  Echo in 5/23 was consistent with cardiac amyloidosis,  EF 60-65%, severe concentric LVH, normal RV, PASP 49 mmHg, moderate MR, small pericardial effusion. She does not look volume overloaded on exam.  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 This may be an earlier manifestation before LV involvement.  This can be associated with atrial fibrillation. She will continue tafamidis.   - Continue torsemide 30 mg bid, BMET today. - Continue spiro 12.5 mg daily. 3. HTN: BP controlled today.  - Previously on amlodipine. Can add back later for BP control if needed. 4. Chronic atrial fibrillation: Continue coumadin.  She is off metoprolol due to bradycardia. Holter off metoprolol did not show any high rate episodes.  5. Hyperlipidemia: On Crestor. Good lipids 2/23. 6. Left Renal Mass: MR of abdomen with findings suspicious for hemorrhagic cyst. - She has follow up with Urology, Dr. Cain Sieve soon.  Follow up in 4 months with APP.   Cassandria Santee, FNP-BC 05/29/2022  [facility] [address] [facility] [place] [address] [phone] (office) [phone] (fax)

## 2022-05-29 ENCOUNTER — Ambulatory Visit (HOSPITAL_COMMUNITY)
Admission: RE | Admit: 2022-05-29 | Discharge: 2022-05-29 | Disposition: A | Discharge status: Home / Self Care | Payer: Medicare HMO | Source: Ambulatory Visit | Attending: Family Medicine | Admitting: Family Medicine

## 2022-05-29 ENCOUNTER — Encounter (HOSPITAL_COMMUNITY): Payer: Self-pay

## 2022-05-29 VITALS — BP 112/68 | HR 72 | Wt 133.6 lb

## 2022-05-29 DIAGNOSIS — I5032 Chronic diastolic (congestive) heart failure: Secondary | ICD-10-CM | POA: Diagnosis not present

## 2022-05-29 DIAGNOSIS — I10 Essential (primary) hypertension: Secondary | ICD-10-CM | POA: Diagnosis not present

## 2022-05-29 DIAGNOSIS — I3139 Other pericardial effusion (noninflammatory): Secondary | ICD-10-CM | POA: Diagnosis not present

## 2022-05-29 DIAGNOSIS — Z79899 Other long term (current) drug therapy: Secondary | ICD-10-CM | POA: Diagnosis not present

## 2022-05-29 DIAGNOSIS — I2721 Secondary pulmonary arterial hypertension: Secondary | ICD-10-CM | POA: Diagnosis not present

## 2022-05-29 DIAGNOSIS — I272 Pulmonary hypertension, unspecified: Secondary | ICD-10-CM

## 2022-05-29 DIAGNOSIS — I482 Chronic atrial fibrillation, unspecified: Secondary | ICD-10-CM | POA: Insufficient documentation

## 2022-05-29 DIAGNOSIS — R0609 Other forms of dyspnea: Secondary | ICD-10-CM | POA: Insufficient documentation

## 2022-05-29 DIAGNOSIS — N2889 Other specified disorders of kidney and ureter: Secondary | ICD-10-CM | POA: Insufficient documentation

## 2022-05-29 DIAGNOSIS — Z7901 Long term (current) use of anticoagulants: Secondary | ICD-10-CM | POA: Insufficient documentation

## 2022-05-29 DIAGNOSIS — I110 Hypertensive heart disease with heart failure: Secondary | ICD-10-CM | POA: Insufficient documentation

## 2022-05-29 DIAGNOSIS — E785 Hyperlipidemia, unspecified: Secondary | ICD-10-CM | POA: Insufficient documentation

## 2022-05-29 DIAGNOSIS — I4821 Permanent atrial fibrillation: Secondary | ICD-10-CM

## 2022-05-29 LAB — CBC
HCT: 30.8 % — ABNORMAL LOW (ref 36.0–46.0)
Hemoglobin: 9.1 g/dL — ABNORMAL LOW (ref 12.0–15.0)
MCH: 29.6 pg (ref 26.0–34.0)
MCHC: 29.5 g/dL — ABNORMAL LOW (ref 30.0–36.0)
MCV: 100.3 fL — ABNORMAL HIGH (ref 80.0–100.0)
Platelets: 219 10*3/uL (ref 150–400)
RBC: 3.07 MIL/uL — ABNORMAL LOW (ref 3.87–5.11)
RDW: 16.5 % — ABNORMAL HIGH (ref 11.5–15.5)
WBC: 4.4 10*3/uL (ref 4.0–10.5)
nRBC: 0 % (ref 0.0–0.2)

## 2022-05-29 LAB — PROTIME-INR
INR: 2.5 — ABNORMAL HIGH (ref 0.8–1.2)
Prothrombin Time: 26.5 seconds — ABNORMAL HIGH (ref 11.4–15.2)

## 2022-05-29 LAB — COMPREHENSIVE METABOLIC PANEL
ALT: 9 U/L (ref 0–44)
AST: 27 U/L (ref 15–41)
Albumin: 2.9 g/dL — ABNORMAL LOW (ref 3.5–5.0)
Alkaline Phosphatase: 31 U/L — ABNORMAL LOW (ref 38–126)
Anion gap: 10 (ref 5–15)
BUN: 13 mg/dL (ref 8–23)
CO2: 28 mmol/L (ref 22–32)
Calcium: 9.6 mg/dL (ref 8.9–10.3)
Chloride: 104 mmol/L (ref 98–111)
Creatinine, Ser: 1.59 mg/dL — ABNORMAL HIGH (ref 0.44–1.00)
GFR, Estimated: 32 mL/min — ABNORMAL LOW (ref >60)
Glucose, Bld: 85 mg/dL (ref 70–99)
Potassium: 3.8 mmol/L (ref 3.5–5.1)
Sodium: 142 mmol/L (ref 135–145)
Total Bilirubin: 0.5 mg/dL (ref 0.3–1.2)
Total Protein: 6.9 g/dL (ref 6.5–8.1)

## 2022-05-29 LAB — BRAIN NATRIURETIC PEPTIDE: B Natriuretic Peptide: 828 pg/mL — ABNORMAL HIGH (ref 0.0–100.0)

## 2022-05-29 NOTE — Patient Instructions (Signed)
Stop amlodipine (not currently taking), Labs drawn today - will call you if abnormal. Return to see Dr. Aundra Dubin in 4 months. Please call [facility] Failure [facility] in 2 months (February) to schedule this appointment. Call our clinic if any questions or concerns prior to this appointment.

## 2022-06-02 ENCOUNTER — Telehealth (HOSPITAL_COMMUNITY): Payer: Self-pay | Admitting: *Deleted

## 2022-06-02 DIAGNOSIS — I5032 Chronic diastolic (congestive) heart failure: Secondary | ICD-10-CM

## 2022-06-02 NOTE — Telephone Encounter (Signed)
Called patient per Vicki Katz, NP with following: 1. Repeat BMET in 2 weeks on 06/17/22 at 2:30 2. Lab results forwarded to her PCP as well.   Pt verbalized understanding of same.

## 2022-06-04 DIAGNOSIS — Z09 Encounter for follow-up examination after completed treatment for conditions other than malignant neoplasm: Secondary | ICD-10-CM | POA: Diagnosis not present

## 2022-06-04 DIAGNOSIS — I272 Pulmonary hypertension, unspecified: Secondary | ICD-10-CM | POA: Diagnosis not present

## 2022-06-04 DIAGNOSIS — E538 Deficiency of other specified B group vitamins: Secondary | ICD-10-CM | POA: Diagnosis not present

## 2022-06-10 ENCOUNTER — Other Ambulatory Visit: Payer: Self-pay

## 2022-06-10 ENCOUNTER — Other Ambulatory Visit (HOSPITAL_COMMUNITY): Payer: Self-pay

## 2022-06-12 ENCOUNTER — Other Ambulatory Visit: Payer: Self-pay

## 2022-06-12 ENCOUNTER — Other Ambulatory Visit (HOSPITAL_COMMUNITY): Payer: Self-pay

## 2022-06-15 ENCOUNTER — Telehealth (HOSPITAL_COMMUNITY): Payer: Self-pay | Admitting: Pharmacy Technician

## 2022-06-15 ENCOUNTER — Other Ambulatory Visit (HOSPITAL_COMMUNITY): Payer: Self-pay

## 2022-06-15 ENCOUNTER — Other Ambulatory Visit: Payer: Self-pay

## 2022-06-15 NOTE — Telephone Encounter (Signed)
Advanced [facility] Patient Advocate Encounter  The patient was approved for a PAN grant that will help cover the cost of Vyndamax. Total amount awarded, $8500. Eligibility dates, 02/04/2022 - 05/05/2023.   BIN [id number] PCN PANF ID  [id number] Group [id number]  Billing information added to [place], CPhT

## 2022-06-16 ENCOUNTER — Telehealth (HOSPITAL_COMMUNITY): Payer: Self-pay | Admitting: Pharmacist

## 2022-06-16 ENCOUNTER — Other Ambulatory Visit (HOSPITAL_COMMUNITY): Payer: Self-pay

## 2022-06-16 MED ORDER — SPIRONOLACTONE 25 MG PO TABS: 12.5000 mg | ORAL_TABLET | Freq: Every day | ORAL | 6 refills | Status: DC

## 2022-06-16 NOTE — Telephone Encounter (Signed)
Advanced Heart Failure Patient Advocate Encounter  Prior Authorization for tadalafil has been approved.     Effective dates: 06/16/22 through 06/29/23  [name], PharmD, BCPS, BCCP, CPP [facility] Pharmacist [phone]

## 2022-06-17 ENCOUNTER — Ambulatory Visit (HOSPITAL_COMMUNITY)
Admission: RE | Admit: 2022-06-17 | Discharge: 2022-06-17 | Disposition: A | Discharge status: Home / Self Care | Payer: Medicare HMO | Source: Ambulatory Visit | Attending: Cardiology | Admitting: Cardiology

## 2022-06-17 DIAGNOSIS — I5032 Chronic diastolic (congestive) heart failure: Secondary | ICD-10-CM | POA: Diagnosis not present

## 2022-06-17 LAB — BASIC METABOLIC PANEL
Anion gap: 8 (ref 5–15)
BUN: 19 mg/dL (ref 8–23)
CO2: 28 mmol/L (ref 22–32)
Calcium: 9.4 mg/dL (ref 8.9–10.3)
Chloride: 104 mmol/L (ref 98–111)
Creatinine, Ser: 1.45 mg/dL — ABNORMAL HIGH (ref 0.44–1.00)
GFR, Estimated: 36 mL/min — ABNORMAL LOW (ref >60)
Glucose, Bld: 78 mg/dL (ref 70–99)
Potassium: 4.1 mmol/L (ref 3.5–5.1)
Sodium: 140 mmol/L (ref 135–145)

## 2022-06-23 ENCOUNTER — Other Ambulatory Visit (HOSPITAL_COMMUNITY): Payer: Self-pay

## 2022-06-23 ENCOUNTER — Telehealth (HOSPITAL_COMMUNITY): Payer: Self-pay

## 2022-06-23 NOTE — Telephone Encounter (Signed)
Advanced Heart Failure Patient Advocate Encounter  The patient was approved for a [place] that will help cover the cost of Vyndamax.  Total amount awarded, $10,000.  Effective: 07/22/22 - 07/22/23.  BIN [id number] PCN [id number] Group [id number] ID [id number]  New processing information added to Vicki [name], CPhT Rx Patient Advocate Phone: [phone]

## 2022-06-25 DIAGNOSIS — I509 Heart failure, unspecified: Secondary | ICD-10-CM | POA: Diagnosis not present

## 2022-06-25 DIAGNOSIS — D49512 Neoplasm of unspecified behavior of left kidney: Secondary | ICD-10-CM | POA: Diagnosis not present

## 2022-07-06 ENCOUNTER — Telehealth (HOSPITAL_COMMUNITY): Payer: Self-pay | Admitting: Pharmacy Technician

## 2022-07-06 NOTE — Telephone Encounter (Signed)
Advanced Heart Failure Patient Advocate Encounter  Sent in prescriber portion of Tyvaso assistance application with [employer] on Friday, 01/05.  Lorrene Graef F Delcenia Inman, CPhT 

## 2022-07-07 ENCOUNTER — Other Ambulatory Visit (HOSPITAL_COMMUNITY): Payer: Self-pay

## 2022-07-09 ENCOUNTER — Other Ambulatory Visit (HOSPITAL_COMMUNITY): Payer: Self-pay

## 2022-07-14 ENCOUNTER — Other Ambulatory Visit (HOSPITAL_COMMUNITY): Payer: Self-pay

## 2022-07-21 DIAGNOSIS — R413 Other amnesia: Secondary | ICD-10-CM | POA: Diagnosis not present

## 2022-07-21 DIAGNOSIS — D696 Thrombocytopenia, unspecified: Secondary | ICD-10-CM | POA: Diagnosis not present

## 2022-07-21 DIAGNOSIS — N281 Cyst of kidney, acquired: Secondary | ICD-10-CM | POA: Diagnosis not present

## 2022-07-21 DIAGNOSIS — I509 Heart failure, unspecified: Secondary | ICD-10-CM | POA: Diagnosis not present

## 2022-07-21 DIAGNOSIS — I4891 Unspecified atrial fibrillation: Secondary | ICD-10-CM | POA: Diagnosis not present

## 2022-07-26 ENCOUNTER — Other Ambulatory Visit: Payer: Self-pay

## 2022-07-26 ENCOUNTER — Emergency Department (HOSPITAL_COMMUNITY)
Admission: EM | Admit: 2022-07-26 | Discharge: 2022-07-27 | Disposition: A | Discharge status: Home / Self Care | Payer: Medicare HMO | Attending: Emergency Medicine | Admitting: Emergency Medicine

## 2022-07-26 DIAGNOSIS — I509 Heart failure, unspecified: Secondary | ICD-10-CM | POA: Diagnosis not present

## 2022-07-26 DIAGNOSIS — Z7901 Long term (current) use of anticoagulants: Secondary | ICD-10-CM | POA: Diagnosis not present

## 2022-07-26 DIAGNOSIS — K644 Residual hemorrhoidal skin tags: Secondary | ICD-10-CM | POA: Insufficient documentation

## 2022-07-26 DIAGNOSIS — N2889 Other specified disorders of kidney and ureter: Secondary | ICD-10-CM | POA: Diagnosis not present

## 2022-07-26 DIAGNOSIS — R197 Diarrhea, unspecified: Secondary | ICD-10-CM | POA: Diagnosis not present

## 2022-07-26 DIAGNOSIS — X58XXXA Exposure to other specified factors, initial encounter: Secondary | ICD-10-CM | POA: Insufficient documentation

## 2022-07-26 DIAGNOSIS — S0990XA Unspecified injury of head, initial encounter: Secondary | ICD-10-CM | POA: Insufficient documentation

## 2022-07-26 DIAGNOSIS — R109 Unspecified abdominal pain: Secondary | ICD-10-CM | POA: Insufficient documentation

## 2022-07-26 DIAGNOSIS — I739 Peripheral vascular disease, unspecified: Secondary | ICD-10-CM | POA: Diagnosis not present

## 2022-07-26 DIAGNOSIS — K5900 Constipation, unspecified: Secondary | ICD-10-CM | POA: Diagnosis not present

## 2022-07-26 DIAGNOSIS — K8020 Calculus of gallbladder without cholecystitis without obstruction: Secondary | ICD-10-CM | POA: Diagnosis not present

## 2022-07-26 LAB — CBC
HCT: 37.8 % (ref 36.0–46.0)
Hemoglobin: 11.6 g/dL — ABNORMAL LOW (ref 12.0–15.0)
MCH: 30.4 pg (ref 26.0–34.0)
MCHC: 30.7 g/dL (ref 30.0–36.0)
MCV: 99 fL (ref 80.0–100.0)
Platelets: 125 10*3/uL — ABNORMAL LOW (ref 150–400)
RBC: 3.82 MIL/uL — ABNORMAL LOW (ref 3.87–5.11)
RDW: 16.3 % — ABNORMAL HIGH (ref 11.5–15.5)
WBC: 8.9 10*3/uL (ref 4.0–10.5)
nRBC: 0 % (ref 0.0–0.2)

## 2022-07-26 LAB — COMPREHENSIVE METABOLIC PANEL
ALT: 12 U/L (ref 0–44)
AST: 28 U/L (ref 15–41)
Albumin: 4 g/dL (ref 3.5–5.0)
Alkaline Phosphatase: 34 U/L — ABNORMAL LOW (ref 38–126)
Anion gap: 13 (ref 5–15)
BUN: 18 mg/dL (ref 8–23)
CO2: 27 mmol/L (ref 22–32)
Calcium: 9.6 mg/dL (ref 8.9–10.3)
Chloride: 99 mmol/L (ref 98–111)
Creatinine, Ser: 1.46 mg/dL — ABNORMAL HIGH (ref 0.44–1.00)
GFR, Estimated: 36 mL/min — ABNORMAL LOW (ref >60)
Glucose, Bld: 98 mg/dL (ref 70–99)
Potassium: 3.6 mmol/L (ref 3.5–5.1)
Sodium: 139 mmol/L (ref 135–145)
Total Bilirubin: 0.7 mg/dL (ref 0.3–1.2)
Total Protein: 7.7 g/dL (ref 6.5–8.1)

## 2022-07-26 LAB — LIPASE, BLOOD: Lipase: 45 U/L (ref 11–51)

## 2022-07-26 NOTE — ED Triage Notes (Signed)
Patient reports multiple diarrhea with mild rectal bleeding onset today , no emesis or fever .

## 2022-07-27 ENCOUNTER — Emergency Department (HOSPITAL_COMMUNITY): Payer: Medicare HMO

## 2022-07-27 DIAGNOSIS — I739 Peripheral vascular disease, unspecified: Secondary | ICD-10-CM | POA: Diagnosis not present

## 2022-07-27 DIAGNOSIS — N2889 Other specified disorders of kidney and ureter: Secondary | ICD-10-CM | POA: Diagnosis not present

## 2022-07-27 DIAGNOSIS — S0990XA Unspecified injury of head, initial encounter: Secondary | ICD-10-CM | POA: Diagnosis not present

## 2022-07-27 DIAGNOSIS — K8020 Calculus of gallbladder without cholecystitis without obstruction: Secondary | ICD-10-CM | POA: Diagnosis not present

## 2022-07-27 LAB — URINALYSIS, ROUTINE W REFLEX MICROSCOPIC
Bilirubin Urine: NEGATIVE
Glucose, UA: NEGATIVE mg/dL
Ketones, ur: NEGATIVE mg/dL
Nitrite: NEGATIVE
Protein, ur: 100 mg/dL — AB
Specific Gravity, Urine: 1.011 (ref 1.005–1.030)
pH: 6 (ref 5.0–8.0)

## 2022-07-27 LAB — PROTIME-INR
INR: 1.7 — ABNORMAL HIGH (ref 0.8–1.2)
Prothrombin Time: 19.7 seconds — ABNORMAL HIGH (ref 11.4–15.2)

## 2022-07-27 MED ORDER — POLYETHYLENE GLYCOL 3350 17 G PO PACK: 17.0000 g | PACK | Freq: Every day | ORAL | 0 refills | Status: DC

## 2022-07-27 MED ORDER — SORBITOL 70 % SOLN
960.0000 mL | TOPICAL_OIL | Freq: Once | ORAL | Status: DC
  Filled 2022-07-27: qty 240

## 2022-07-27 MED ORDER — PSYLLIUM 58.6 % PO PACK: 1.0000 | PACK | Freq: Every day | ORAL | 12 refills | Status: DC

## 2022-07-27 NOTE — ED Provider Notes (Signed)
[facility] Provider Note   CSN: [id number] Arrival date & time: 07/26/22  2102     History  Chief Complaint  Patient presents with   Diarrhea     Vicki [name] is a 82 y.o. female.  HPI Patient presents for diarrhea, rectal bleeding, and rectal irritation.  Medical history includes thrombocytopenia, pulmonary hypertension, A-fib, CHF, arthritis.  She was in her normal state of health yesterday morning.  Around midday yesterday, she began having multiple and frequent bowel movements.  She describes these as a mixture of solid stools and diarrhea.  She describes her color as dark.  She does state that there was red blood on the toilet tissue when she wiped.  Initially, these were occurring every 20 minutes.  Episode subsequently slowed down.  She had a 11-hour wait in the ED waiting room with continued improvement in frequency of these episodes.  She endorses some mild irritation and discomfort in her rectal area.  She denies any abdominal discomfort at this time.  She states that she would have some mild abdominal discomfort prior to each episode of bowel movements.  She states that she had a similar episode 1 to 2 years ago.  At the time, she was placed on a stool softener.  She has continued to take stool softeners daily.    Home Medications Prior to Admission medications   Medication Sig Start Date End Date Taking? Authorizing Provider  polyethylene glycol (MIRALAX) 17 g packet Take 17 g by mouth daily. 07/27/22  Yes Godfrey Pick, MD  psyllium (METAMUCIL) 58.6 % packet Take 1 packet by mouth daily. 07/27/22  Yes Godfrey Pick, MD  acetaminophen (TYLENOL) 500 MG tablet Take 500 mg by mouth every 6 (six) hours as needed for mild pain or headache.     [provider]  busPIRone (BUSPAR) 5 MG tablet Take 5 mg by mouth every morning. 12/19/21   [provider]  Cholecalciferol (VITAMIN D3) 2000 units TABS Take 1 tablet by mouth daily.      [provider]  cyanocobalamin (VITAMIN B12) 1000 MCG tablet Take 1 tablet (1,000 mcg total) by mouth daily. 05/19/22   Elgergawy, Silver Huguenin, MD  KLOR-CON M20 20 MEQ tablet TAKE TWO TABLETS BY MOUTH IN THE MORNING AND ONE IN THE EVENING Patient taking differently: Take 40 mEq by mouth every evening. 11/18/15   Larey Dresser, MD  levocetirizine (XYZAL) 5 MG tablet Take 2.5 mg by mouth daily in the afternoon. Patient not taking: Reported on 05/15/2022    [provider]  macitentan (OPSUMIT) 10 MG tablet TAKE 1 TABLET (10 MG TOTAL) BY MOUTH DAILY WITH BREAKFAST. Patient taking differently: Take 10 mg by mouth daily. 09/08/21   Larey Dresser, MD  rosuvastatin (CRESTOR) 20 MG tablet Take 20 mg by mouth at bedtime.     [provider]  spironolactone (ALDACTONE) 25 MG tablet Take 0.5 tablets (12.5 mg total) by mouth daily. 06/16/22   [place], Maricela Bo, FNP  tadalafil, PAH, (ADCIRCA) 20 MG tablet TAKE 2 TABLETS DAILY GENERIC FOR ADCIRCA Patient taking differently: Take 40 mg by mouth every evening. 05/01/21   Larey Dresser, MD  Tafamidis [facility]) 61 MG CAPS Take 1 capsule by mouth daily. Patient taking differently: Take 61 mg by mouth daily. 08/27/21   Larey Dresser, MD  torsemide (DEMADEX) 20 MG tablet Take 1.5 tablets (30 mg total) by mouth 2 (two) times daily. 03/06/16   Aundra Dubin,  Elby Showers, MD  Treprostinil (TYVASO) 0.6 MG/ML SOLN Inhale 54 mcg into the lungs 4 (four) times daily. Patient taking differently: Inhale into the lungs See admin instructions. 4 puffs by mouth 3 times daily. 01/07/21   Larey Dresser, MD  warfarin (COUMADIN) 5 MG tablet TAKE ONE TABLET BY MOUTH EVERY EVENING EXCEPT 1/2 TABLET ON MONDAYS OR AS DIRECTED 05/20/22   Elgergawy, Silver Huguenin, MD      Allergies    Patient has no known allergies.    Review of Systems   Review of Systems  Gastrointestinal:  Positive for abdominal pain, blood in stool, diarrhea and rectal pain.  All other  systems reviewed and are negative.   Physical Exam Updated Vital Signs BP 130/74   Pulse 72   Temp 98.6 F (37 C) (Oral)   Resp 18   SpO2 99%  Physical Exam Vitals and nursing note reviewed. Exam conducted with a chaperone present.  Constitutional:      General: She is not in acute distress.    Appearance: Normal appearance. She is well-developed. She is not ill-appearing, toxic-appearing or diaphoretic.  HENT:     Head: Normocephalic and atraumatic.     Right Ear: External ear normal.     Left Ear: External ear normal.     Nose: Nose normal.  Eyes:     General: No scleral icterus.    Extraocular Movements: Extraocular movements intact.     Conjunctiva/sclera: Conjunctivae normal.  Cardiovascular:     Rate and Rhythm: Normal rate and regular rhythm.  Pulmonary:     Effort: Pulmonary effort is normal. No respiratory distress.  Abdominal:     General: There is no distension.     Palpations: Abdomen is soft.     Tenderness: There is no abdominal tenderness.  Genitourinary:    Rectum: External hemorrhoid present.     Comments: [place] diarrhea is present.  No gross blood or melena.  On DRE, there was firm stool present.  This was at level of fingertip. Musculoskeletal:        General: No swelling.     Cervical back: Normal range of motion and neck supple.     Right lower leg: No edema.     Left lower leg: No edema.  Skin:    General: Skin is warm and dry.     Capillary Refill: Capillary refill takes less than 2 seconds.     Coloration: Skin is not jaundiced or pale.  Neurological:     General: No focal deficit present.     Mental Status: She is alert and oriented to person, place, and time.     Cranial Nerves: No cranial nerve deficit.     Sensory: No sensory deficit.     Motor: No weakness.     Coordination: Coordination normal.  Psychiatric:        Mood and Affect: Mood normal.        Behavior: Behavior normal.        Thought Content: Thought content normal.         Judgment: Judgment normal.     ED Results / Procedures / Treatments   Labs (all labs ordered are listed, but only abnormal results are displayed) Labs Reviewed  COMPREHENSIVE METABOLIC PANEL - Abnormal; Notable for the following components:      Result Value   Creatinine, Ser 1.46 (*)    Alkaline Phosphatase 34 (*)    GFR, Estimated 36 (*)    All other  components within normal limits  CBC - Abnormal; Notable for the following components:   RBC 3.82 (*)    Hemoglobin 11.6 (*)    RDW 16.3 (*)    Platelets 125 (*)    All other components within normal limits  URINALYSIS, ROUTINE W REFLEX MICROSCOPIC - Abnormal; Notable for the following components:   APPearance HAZY (*)    Hgb urine dipstick SMALL (*)    Protein, ur 100 (*)    Leukocytes,Ua LARGE (*)    Bacteria, UA RARE (*)    All other components within normal limits  PROTIME-INR - Abnormal; Notable for the following components:   Prothrombin Time 19.7 (*)    INR 1.7 (*)    All other components within normal limits  LIPASE, BLOOD    EKG None  Radiology CT Head Wo Contrast  Result Date: 07/27/2022 CLINICAL DATA:  Head trauma, minor.  Fall. EXAM: CT HEAD WITHOUT CONTRAST TECHNIQUE: Contiguous axial images were obtained from the base of the skull through the vertex without intravenous contrast. RADIATION DOSE REDUCTION: This exam was performed according to the departmental dose-optimization program which includes automated exposure control, adjustment of the mA and/or kV according to patient size and/or use of iterative reconstruction technique. COMPARISON:  Head CT 05/16/2022. FINDINGS: Brain: No acute hemorrhage. Unchanged chronic small-vessel disease. Cortical gray-white differentiation is otherwise preserved. Prominence of the ventricles and sulci within normal limits for age. No extra-axial collection. Basilar cisterns are patent. Vascular: No hyperdense vessel or unexpected calcification. Skull: No calvarial fracture or  suspicious bone lesion. Skull base is unremarkable. Sinuses/Orbits: Paranasal sinuses, mastoid air cells, and middle ear cavities are well aerated. Orbits are unremarkable. Other: No scalp hematoma. IMPRESSION: No acute intracranial abnormality. Electronically Signed   By: Emmit Alexanders M.D.   On: 07/27/2022 14:32   CT ABDOMEN PELVIS WO CONTRAST  Result Date: 07/27/2022 CLINICAL DATA:  82 year old with abdominal pain, acute, nonlocalized. Diarrhea and mild rectal bleeding. EXAM: CT ABDOMEN AND PELVIS WITHOUT CONTRAST TECHNIQUE: Multidetector CT imaging of the abdomen and pelvis was performed following the standard protocol without IV contrast. RADIATION DOSE REDUCTION: This exam was performed according to the departmental dose-optimization program which includes automated exposure control, adjustment of the mA and/or kV according to patient size and/or use of iterative reconstruction technique. COMPARISON:  MRI abdomen 05/18/2022 and CT renal stone study 05/15/2022 FINDINGS: Lower chest: Stable cardiac enlargement. Several tiny nodules at the lung bases. Some of these nodules appear to be chronic but others are indeterminate. These nodules measure approximately 2 mm or less. No pleural effusions. Hepatobiliary: Multiple calcified gallstones. No significant gallbladder distension. No inflammatory changes around the gallbladder. Normal appearance of the liver. Pancreas: Unremarkable. No pancreatic ductal dilatation or surrounding inflammatory changes. Spleen: Normal in size without focal abnormality. Adrenals/Urinary Tract: Limited evaluation of the adrenal glands without gross abnormality. Tiny densities in the right kidney may represent tiny nonobstructive stones. Negative for right hydronephrosis. Normal appearance of the urinary bladder. The slightly dense lesion along the inferior left kidney has markedly decreased in size compared to prior CT. Lesion measures 6.0 x 5.2 x 6.2 cm and previously measured 10.0  x 8.8 x 9.6 cm. Negative for left hydronephrosis. Stomach/Bowel: Rectum is distended with high-density stool. Increased presacral and perirectal stranding. No evidence for bowel obstruction. Normal appearance of the stomach. Vascular/Lymphatic: Aortic atherosclerosis. No enlarged abdominal or pelvic lymph nodes. Reproductive: Status post hysterectomy. No adnexal masses. Other: No free fluid.  Negative for free air. Musculoskeletal: Stable anterolisthesis  of L4 on L5 that appears to be secondary to facet arthropathy. No acute bone abnormality. Mild subcutaneous edema in the abdomen and pelvis. IMPRESSION: 1. Rectum is distended with high-density stool and there is increased perirectal and presacral stranding. Findings could be associated with fecal impaction. 2. Cholelithiasis without acute gallbladder inflammation. 3. Decreased size of the left renal lesion. This lesion is indeterminate on this examination but compatible with a resolving hemorrhagic lesion. Cannot exclude an underlying neoplasm and recommend follow-up MRI of the abdomen, with and without contrast, in 3-6 months. 4. Several tiny nodules at the lung bases. Some of these nodules appear to be chronic but others are indeterminate. No follow-up needed if patient is low-risk. Non-contrast chest CT can be considered in 12 months if patient is high-risk. This recommendation follows the consensus statement: Guidelines for Management of Incidental Pulmonary Nodules Detected on CT Images: From the Fleischner Society 2017; Radiology 2017; 284:228-243. 5.  Aortic Atherosclerosis (ICD10-I70.0). 6. Tiny right renal calculi may represent tiny nonobstructive stones. Electronically Signed   By: Markus Daft M.D.   On: 07/27/2022 12:24    Procedures Fecal disimpaction  Date/Time: 07/27/2022 3:32 PM  Performed by: Godfrey Pick, MD Authorized by: Godfrey Pick, MD  Consent: Verbal consent obtained. Risks and benefits: risks, benefits and alternatives were  discussed Consent given by: patient Patient consent: the patient's understanding of the procedure matches consent given Patient identity confirmed: verbally with patient Local anesthesia used: no  Anesthesia: Local anesthesia used: no  Sedation: Patient sedated: no  Patient tolerance: patient tolerated the procedure well with no immediate complications       Medications Ordered in ED Medications  sorbitol, milk of mag, mineral oil, glycerin (SMOG) enema (has no administration in time range)    ED Course/ Medical Decision Making/ A&P                             Medical Decision Making Amount and/or Complexity of Data Reviewed Labs: ordered. Radiology: ordered.   This patient presents to the ED for concern of diarrhea and rectal discomfort, this involves an extensive number of treatment options, and is a complaint that carries with it a high risk of complications and morbidity.  The differential diagnosis includes hemorrhoids, constipation with encopresis, neoplasm, diverticulitis   Co morbidities that complicate the patient evaluation  thrombocytopenia, pulmonary hypertension, A-fib, CHF, arthritis   Additional history obtained:  Additional history obtained from patient's daughter External records from outside source obtained and reviewed including EMR   Lab Tests:  I Ordered, and personally interpreted labs.  The pertinent results include: Baseline creatinine, normal electrolytes, anemia slightly improved from baseline, no leukocytosis, equivocal urinalysis   Imaging Studies ordered:  I ordered imaging studies including CT of head, abdomen, pelvis I independently visualized and interpreted imaging which showed no acute findings on CT head.  On CT scan of abdomen pelvis, there is a large stool burden with distention of the rectum I agree with the radiologist interpretation   Cardiac Monitoring: / EKG:  The patient was maintained on a cardiac monitor.  I  personally viewed and interpreted the cardiac monitored which showed an underlying rhythm of: Sinus rhythm  Problem List / ED Course / Critical interventions / Medication management  Patient presents for diarrhea.  Prior to being bedded in the ED, diagnostic workup was initiated.  CBC shows anemia improved from baseline, no leukocytosis, mild thrombocytopenia, baseline creatinine, normal electrolytes.  Urinalysis is  equivocal with squamous epithelial cells present.  She denies any recent urinary symptoms.  On assessment, patient is well-appearing.  Vital signs are normal.  She has no abdominal distention or tenderness.  Rectal exam was performed with nurse chaperone present.  There was a brown diarrhea only.  There was no gross blood or melena.  External hemorrhoid was present with associated tenderness.  I suspect the discomfort and bleeding are secondary to hemorrhoids.  Patient currently is doing sitz bath's at home.  She was advised to continue this in addition to adding a fiber supplement and hydration to her home routine to maintain daily soft stools, minimize diarrhea, and to minimize future episodes of hemorrhoids.  On CT scan, there was distention of rectum concerning for fecal impaction.  While in the ED, patient attempted to get up to use the bathroom.  She had a fall due to loss of balance.  She fell backwards and struck the back of her head.  CT of head was ordered.  CT of head showed no acute findings.  Patient continued to have normal mentation in the ED.  She denies any new headache pain.  Patient underwent disimpaction.  There was firm stool but it was at the level of the fingertip and minimal stool is able to be manually disimpacted.  She subsequently underwent a soapsuds enema and did have a softball sized bowel movement.  This is close to the size of the impacted stool that was present on CT scan.  Patient does feel comfortable with discharge home with ongoing oral treatments for her  constipation.  She was advised to follow-up with her primary care doctor soon as possible and to return to the emergency department for any worsening of symptoms.  She was discharged in stable condition. I ordered medication including enema for constipation Reevaluation of the patient after these medicines showed that the patient improved I have reviewed the patients home medicines and have made adjustments as needed   Social Determinants of Health:  Has access to outpatient care         Final Clinical Impression(s) / ED Diagnoses Final diagnoses:  Constipation, unspecified constipation type    Rx / DC Orders ED Discharge Orders          Ordered    polyethylene glycol (MIRALAX) 17 g packet  Daily        07/27/22 1704    psyllium (METAMUCIL) 58.6 % packet  Daily        07/27/22 1704              Godfrey Pick, MD 07/27/22 1705

## 2022-07-27 NOTE — Discharge Instructions (Addendum)
A prescription for MiraLAX was sent to your pharmacy.  This is a stool softener that can also be purchased over-the-counter.  For an initial bowel cleanout, take 6-8 doses.  This should be reduced after passage of a large amount of stool to minimize subsequent diarrhea.  You should add on a daily fiber supplement as well.  Metamucil is a common over-the-counter fiber supplement that you can take to maintain daily soft stools.  It will also help with diarrhea.  A prescription of this was also sent to your pharmacy.  Please follow-up with your primary care doctor soon as possible.  Also, please return to the ED if you have any new or worsening symptoms.

## 2022-07-27 NOTE — ED Notes (Signed)
Pt disimpacted by EDP and soap suds enema given after. Pt on bedside commode now

## 2022-07-29 ENCOUNTER — Ambulatory Visit: Payer: Self-pay | Admitting: Neurology

## 2022-07-29 DIAGNOSIS — G3184 Mild cognitive impairment, so stated: Secondary | ICD-10-CM | POA: Diagnosis not present

## 2022-07-29 DIAGNOSIS — Z7901 Long term (current) use of anticoagulants: Secondary | ICD-10-CM | POA: Diagnosis not present

## 2022-07-29 DIAGNOSIS — Z8249 Family history of ischemic heart disease and other diseases of the circulatory system: Secondary | ICD-10-CM | POA: Diagnosis not present

## 2022-07-29 DIAGNOSIS — N189 Chronic kidney disease, unspecified: Secondary | ICD-10-CM | POA: Diagnosis not present

## 2022-07-29 DIAGNOSIS — K5900 Constipation, unspecified: Secondary | ICD-10-CM | POA: Diagnosis not present

## 2022-07-29 DIAGNOSIS — E785 Hyperlipidemia, unspecified: Secondary | ICD-10-CM | POA: Diagnosis not present

## 2022-07-29 DIAGNOSIS — I509 Heart failure, unspecified: Secondary | ICD-10-CM | POA: Diagnosis not present

## 2022-07-29 DIAGNOSIS — I130 Hypertensive heart and chronic kidney disease with heart failure and stage 1 through stage 4 chronic kidney disease, or unspecified chronic kidney disease: Secondary | ICD-10-CM | POA: Diagnosis not present

## 2022-07-29 DIAGNOSIS — J309 Allergic rhinitis, unspecified: Secondary | ICD-10-CM | POA: Diagnosis not present

## 2022-07-29 DIAGNOSIS — D6869 Other thrombophilia: Secondary | ICD-10-CM | POA: Diagnosis not present

## 2022-07-29 DIAGNOSIS — R69 Illness, unspecified: Secondary | ICD-10-CM | POA: Diagnosis not present

## 2022-07-29 DIAGNOSIS — I272 Pulmonary hypertension, unspecified: Secondary | ICD-10-CM | POA: Diagnosis not present

## 2022-07-30 DIAGNOSIS — K644 Residual hemorrhoidal skin tags: Secondary | ICD-10-CM | POA: Diagnosis not present

## 2022-07-30 DIAGNOSIS — N281 Cyst of kidney, acquired: Secondary | ICD-10-CM | POA: Diagnosis not present

## 2022-07-30 DIAGNOSIS — R911 Solitary pulmonary nodule: Secondary | ICD-10-CM | POA: Diagnosis not present

## 2022-07-30 DIAGNOSIS — Z09 Encounter for follow-up examination after completed treatment for conditions other than malignant neoplasm: Secondary | ICD-10-CM | POA: Diagnosis not present

## 2022-07-30 DIAGNOSIS — K5909 Other constipation: Secondary | ICD-10-CM | POA: Diagnosis not present

## 2022-08-04 ENCOUNTER — Other Ambulatory Visit (HOSPITAL_COMMUNITY): Payer: Self-pay

## 2022-08-04 ENCOUNTER — Telehealth: Payer: Self-pay

## 2022-08-04 NOTE — Telephone Encounter (Signed)
Advanced Heart Failure Patient Advocate Encounter   Patient was approved to receive Tyvaso from [facility]. The medication will be dispensed through [facility].  Effective dates: 08/03/22 through 06/29/23  Document scanned to chart.   Charlann Boxer, CPhT

## 2022-08-04 NOTE — Telephone Encounter (Signed)
        Patient  visited [facility] on 1/29     Telephone encounter attempt :  1st  A HIPAA compliant voice message was left requesting a return call.  Instructed patient to call back    [facility] [phone] [address], [place], [place] [address] Phone: [phone] Email: [email or web address]

## 2022-08-05 ENCOUNTER — Telehealth: Payer: Self-pay

## 2022-08-05 NOTE — Telephone Encounter (Signed)
        Patient  visited [facility] on 1/29    Telephone encounter attempt :   2nd A HIPAA compliant voice message was left requesting a return call.  Instructed patient to call back     [facility] [phone] [address], [place], [place] [address] Phone: [phone] Email: [email or web address]

## 2022-08-07 ENCOUNTER — Other Ambulatory Visit (HOSPITAL_COMMUNITY): Payer: Self-pay

## 2022-08-10 ENCOUNTER — Other Ambulatory Visit: Payer: Self-pay

## 2022-08-12 ENCOUNTER — Ambulatory Visit: Payer: Medicare HMO | Admitting: Neurology

## 2022-08-17 ENCOUNTER — Telehealth (HOSPITAL_COMMUNITY): Payer: Self-pay | Admitting: Pharmacy Technician

## 2022-08-17 NOTE — Telephone Encounter (Signed)
Advanced Heart Failure Patient Advocate Encounter  Received notification from [facility] that they needed to know the patient's therapy status of Opsumit. Called and spoke to representative. Patient is still suppose to be taking Opsumit. Called and spoke with the patient's daughter. She left me know that the patient had excess on hand from when she wasn't taking her medications properly and is now low on Opsumit. Provided phone number to [facility].   Charlann Boxer, CPhT

## 2022-08-21 ENCOUNTER — Other Ambulatory Visit: Payer: Self-pay | Admitting: Urology

## 2022-08-21 DIAGNOSIS — D4102 Neoplasm of uncertain behavior of left kidney: Secondary | ICD-10-CM

## 2022-08-25 ENCOUNTER — Encounter (HOSPITAL_COMMUNITY): Payer: Medicare HMO | Admitting: Cardiology

## 2022-08-26 DIAGNOSIS — I509 Heart failure, unspecified: Secondary | ICD-10-CM | POA: Diagnosis not present

## 2022-08-27 DIAGNOSIS — I482 Chronic atrial fibrillation, unspecified: Secondary | ICD-10-CM | POA: Diagnosis not present

## 2022-08-27 DIAGNOSIS — I5032 Chronic diastolic (congestive) heart failure: Secondary | ICD-10-CM | POA: Diagnosis not present

## 2022-08-27 DIAGNOSIS — N1831 Chronic kidney disease, stage 3a: Secondary | ICD-10-CM | POA: Diagnosis not present

## 2022-08-27 DIAGNOSIS — I130 Hypertensive heart and chronic kidney disease with heart failure and stage 1 through stage 4 chronic kidney disease, or unspecified chronic kidney disease: Secondary | ICD-10-CM | POA: Diagnosis not present

## 2022-09-01 ENCOUNTER — Other Ambulatory Visit (HOSPITAL_COMMUNITY): Payer: Self-pay | Admitting: Cardiology

## 2022-09-01 ENCOUNTER — Ambulatory Visit: Payer: Medicare HMO | Admitting: Neurology

## 2022-09-01 ENCOUNTER — Other Ambulatory Visit (HOSPITAL_COMMUNITY): Payer: Self-pay

## 2022-09-01 ENCOUNTER — Telehealth: Payer: Self-pay | Admitting: Neurology

## 2022-09-01 ENCOUNTER — Encounter: Payer: Self-pay | Admitting: Neurology

## 2022-09-01 VITALS — BP 118/56 | HR 55 | Ht 62.0 in | Wt 135.0 lb

## 2022-09-01 DIAGNOSIS — Z820 Family history of epilepsy and other diseases of the nervous system: Secondary | ICD-10-CM

## 2022-09-01 DIAGNOSIS — Z9981 Dependence on supplemental oxygen: Secondary | ICD-10-CM

## 2022-09-01 DIAGNOSIS — R443 Hallucinations, unspecified: Secondary | ICD-10-CM | POA: Diagnosis not present

## 2022-09-01 DIAGNOSIS — R413 Other amnesia: Secondary | ICD-10-CM

## 2022-09-01 DIAGNOSIS — Z9289 Personal history of other medical treatment: Secondary | ICD-10-CM | POA: Diagnosis not present

## 2022-09-01 NOTE — Patient Instructions (Addendum)
You have complaints of memory loss: memory loss or changes in cognitive function can have many reasons and does not always mean you have dementia. Conditions that can contribute to subjective or objective memory loss include: depression, stress, poor sleep from insomnia or sleep apnea, dehydration, fluctuation in blood sugar values, thyroid or electrolyte dysfunction and certain vitamin deficiencies. Dementia can be caused by stroke, brain atherosclerosis or brain vascular disease due to vascular risk factors (smoking, high blood pressure, high cholesterol, obesity and uncontrolled diabetes), certain degenerative brain disorders (including Parkinson's disease and Multiple sclerosis) and by Alzheimer's disease or other, more rare and sometimes hereditary causes.  You had blood work through the hospital recently and also through primary care, I would like to make sure that you B12 level and thyroid functions are okay, please double check with your primary care. We will do a brain scan, called MRI and call you with the test results. We will have to schedule you for this on a separate date. This test requires authorization from your insurance, and we will take care of the insurance process. Please continue with your memory medication called donepezil which was recently started by your primary care physician. Please try to hydrate better with water, we recommend 6 to 8 cups of water per day on average.  You can double check with your cardiologist if you are limited to less than that.  Please use your oxygen as directed at night.  You have vascular risk factors but your mom also had Alzheimer's dementia, it is possible that you have a mixed type of picture.

## 2022-09-01 NOTE — Progress Notes (Signed)
Subjective:    Patient ID: Vicki [name] is a 82 y.o. female.  HPI    Star Age, MD, PhD [facility] [address] [place], [place] [address]  I saw patient, Vicki [name], as a referral from the emergency room/inpatient hospital for evaluation of her memory loss.  The patient is accompanied by her daughter today.  Ms. Greever is an 82 year old female with an underlying complex medical history of chronic atrial fibrillation on Coumadin, congestive heart failure, pulmonary hypertension, on home oxygen therapy, history of thrombocytopenia, arthritis, allergies, and history of sepsis with hospitalization in November 2023, who reports being forgetful for the past several months.  Daughter reports that she has had some recent auditory and visual hallucinations but this was since her hospitalization in November and she went back to the hospital in January 2024.  She had a recent follow-up with primary care and was started on donepezil 5 mg once daily at bedtime in January 2024.  She takes vitamin B12 by mouth.  She was staying with her daughter for about a month prior to her acute illness right before Thanksgiving.  When she came home from the hospital she was staying with her daughter but then eventually moved back into her home.  She has been living by herself since 2021.  She lost her husband in 2014 but someone stayed with her more or less consistently until 2021.  She quit driving in [id number] or [id number] as she was more confused while driving.  She reports that her mom had memory loss, daughter reports that patient's mom had Alzheimer's dementia and lived to be in her early 3s.  Patient's father died in his early 73s from heart attack.  Patient is the second oldest of a total of 9 siblings, all brothers have passed away.  She has 3 sisters alive including her oldest sister who is 44.  None of her siblings have or had memory loss.  Patient worked for [employer] [place] for  over 40 years.  She has 3 children.  Her daughter Vicki [name] lives in [place], daughter Vicki [name] who is here today lives locally and her son, [place], lives locally as well and is getting ready with his family to move into patient's home so she does not stay by herself.  [place], the patient is aware of her hallucinations recently and has good insight. Of note, she does not drink a whole lot of water, maybe 3 to 4 cups/day.  She does drink a little bit of soda, usually 1 bottle of Sprite, she drinks alcohol very rarely, maybe on special occasions.  She is a non-smoker.  She has not fallen recently but had to catch herself recently.  She does not use a walking aid typically.  She was hospitalized from 05/14/2022 through 05/19/2022 with influenza A with pneumonia and sepsis, she had complication with acute kidney injury.  She has a history of chronic respiratory failure with hypoxia with oxygen requirement.  She was found to have hypokalemia and chronic A-fib, has a history of cardiac amyloidosis and is followed by cardiology.  Hospitalization was complicated by obstipation.  She had elevated troponin which was felt to be related to demand ischemia.  A referral to neurology was placed but not mentioned in the discharge summary.  I reviewed the hospital records, including testing and blood test results.  Vitamin B12 level on 05/17/2022 was 269.  She had a head CT without contrast on 05/16/2022 and I reviewed the  results, indication was altered mental status: Impression: Aging brain without acute or reversible finding. She had presented to the emergency room on 05/14/2022 with increasing cough and back pain as well as generalized weakness.   Of note, she presented to the emergency room on 07/26/2022 with diarrhea, rectal bleeding and rectal irritation.    She is scheduled for an MRI of the abdomen as she was found to have a lesion on the left kidney.  Of note, she was also found to have lung nodules on her abdominal CT scan  from January 2024.  She reports that she has not been using her nighttime oxygen but would be willing to go back on it.  Her Past Medical History Is Significant For: Past Medical History:  Diagnosis Date   Arthritis    elbow   Atrial fibrillation ([place])    Cancer ([place])    Mylenoma- top of head   CHF (congestive heart failure) ([place])    Dysrhythmia    Heart murmur    On home oxygen therapy    4 bliters   Pulmonary hypertension ([facility])    Seasonal allergies    Thrombocytopenia ([place]) 7/16    Her Past Surgical History Is Significant For: Past Surgical History:  Procedure Laterality Date   BREAST LUMPECTOMY Right    benign   CARDIAC CATHETERIZATION  08/12/2006   no significant disease   CARDIAC CATHETERIZATION  10/28/2012   right heart cath done   DOPPLER ECHOCARDIOGRAPHY  Aug 09 2012   severe LVH  with mild cavity obliteration. EF 55-60% without normal relaxationbut difficult to really tell total diastolic function due to a fib; sclerotic aortic valve with no stenosis. mod mitral regurg. mod to severely dilated left atrium; mod dilated rgt right ventricle w/elevated pressures, estimated peak PA presures '@86mmHG'$  TR jet calculation. RGT ATRIUM MOD to SEVERE,     NM MYOCAR PERF WALL MOTION  2004   EXERCISE  ,no ischemia   PARS PLANA VITRECTOMY Right 04/03/2015   Procedure: PARS PLANA VITRECTOMY WITH 25 GAUGE FOR VETREOUS HEMORRHAGE, ENDO LASER;  Surgeon: Jalene Mullet, MD;  Location: [facility];  Service: Ophthalmology;  Laterality: Right;   RIGHT HEART CATHETERIZATION N/A 09/07/2013   Procedure: RIGHT HEART CATH;  Surgeon: Larey Dresser, MD;  Location: [facility] CATH LAB;  Service: Cardiovascular;  Laterality: N/A;   VAGINAL HYSTERECTOMY      Her Family History Is Significant For: Family History  Problem Relation Age of Onset   Heart Problems Brother    Hypertension Brother    Alzheimer's disease Mother    Heart attack Father    Thyroid disease Maternal Aunt    Pulmonary Hypertension Cousin     Heart Problems Maternal Aunt    Thyroid disease Daughter    Thyroid disease Daughter     Her Social History Is Significant For: Social History   Socioeconomic History   Marital status: Widowed    Spouse name: Not on file   Number of children: Not on file   Years of education: Not on file   Highest education level: Not on file  Occupational History   Occupation: retired  Tobacco Use   Smoking status: Never   Smokeless tobacco: Never  Substance and Sexual Activity   Alcohol use: Not Currently    Comment: occ   Drug use: No   Sexual activity: Not on file  Other Topics Concern   Not on file  Social History Narrative   Not on file   Social  Determinants of Health   Financial Resource Strain: Low Risk  (05/19/2022)   Overall Financial Resource Strain (CARDIA)    Difficulty of Paying Living Expenses: Not hard at all  Food Insecurity: No Food Insecurity (05/19/2022)   Hunger Vital Sign    Worried About Running Out of Food in the Last Year: Never true    Ran Out of Food in the Last Year: Never true  Transportation Needs: No Transportation Needs (05/25/2022)   PRAPARE - Hydrologist (Medical): No    Lack of Transportation (Non-Medical): No  Physical Activity: Not on file  Stress: Not on file  Social Connections: Not on file    Her Allergies Are:  No Known Allergies:   Her Current Medications Are:  Outpatient Encounter Medications as of 09/01/2022  Medication Sig   Cholecalciferol (VITAMIN D3) 2000 units TABS Take 1 tablet by mouth daily.    cyanocobalamin (VITAMIN B12) 1000 MCG tablet Take 1 tablet (1,000 mcg total) by mouth daily.   donepezil (ARICEPT) 5 MG tablet Take 5 mg by mouth daily.   KLOR-CON M20 20 MEQ tablet TAKE TWO TABLETS BY MOUTH IN THE MORNING AND ONE IN THE EVENING (Patient taking differently: Take 40 mEq by mouth every evening.)   macitentan (OPSUMIT) 10 MG tablet TAKE 1 TABLET (10 MG TOTAL) BY MOUTH DAILY WITH BREAKFAST.  (Patient taking differently: Take 10 mg by mouth daily.)   polyethylene glycol (MIRALAX) 17 g packet Take 17 g by mouth daily.   psyllium (METAMUCIL) 58.6 % packet Take 1 packet by mouth daily.   rosuvastatin (CRESTOR) 20 MG tablet Take 20 mg by mouth at bedtime.    SENNA PO Take by mouth.   spironolactone (ALDACTONE) 25 MG tablet Take 0.5 tablets (12.5 mg total) by mouth daily.   tadalafil, PAH, (ADCIRCA) 20 MG tablet TAKE 2 TABLETS DAILY GENERIC FOR ADCIRCA (Patient taking differently: Take 40 mg by mouth every evening.)   Tafamidis (VYNDAMAX) 61 MG CAPS Take 1 capsule by mouth daily. (Patient taking differently: Take 61 mg by mouth daily.)   torsemide (DEMADEX) 20 MG tablet Take 1.5 tablets (30 mg total) by mouth 2 (two) times daily.   Treprostinil (TYVASO) 0.6 MG/ML SOLN Inhale 54 mcg into the lungs 4 (four) times daily. (Patient taking differently: Inhale into the lungs See admin instructions. 4 puffs by mouth 3 times daily.)   warfarin (COUMADIN) 5 MG tablet TAKE ONE TABLET BY MOUTH EVERY EVENING EXCEPT 1/2 TABLET ON MONDAYS OR AS DIRECTED   acetaminophen (TYLENOL) 500 MG tablet Take 500 mg by mouth every 6 (six) hours as needed for mild pain or headache.    busPIRone (BUSPAR) 5 MG tablet Take 5 mg by mouth every morning.   levocetirizine (XYZAL) 5 MG tablet Take 2.5 mg by mouth daily in the afternoon.   No facility-administered encounter medications on file as of 09/01/2022.  :   Review of Systems:  Out of a complete 14 point review of systems, all are reviewed and negative with the exception of these symptoms as listed below:   Review of Systems  Neurological:         Pt here for memory loss Pt states short and long term memory is worse Pt states she has hallucinations     Objective:  Neurological Exam  Physical Exam Physical Examination:   Vitals:   09/01/22 1354  BP: (!) 118/56  Pulse: (!) 55    General Examination: The patient is a very  pleasant 82 y.o. female in no  acute distress. She appears well-developed and well-nourished and well groomed.   HEENT: Normocephalic, atraumatic, pupils are equal, round and reactive to light, extraocular tracking is good without limitation to gaze excursion or nystagmus noted.  No facial masking noted, speech is clear without dysarthria, hypophonia or voice tremor.  Airway examination reveals mild to moderate mouth dryness, adequate dental hygiene.  Tongue protrudes centrally and palate elevates symmetrically.  Hearing is grossly intact.  No carotid bruits.    Chest: Clear to auscultation without wheezing, rhonchi or crackles noted.  Heart: S1+S2+0, irregularly irregular, she has a systolic murmur.    Abdomen: Soft, non-tender and non-distended.  Extremities: There is no pitting edema in the distal lower extremities bilaterally.   Skin: Warm and dry without trophic changes noted.   Musculoskeletal: exam reveals no obvious joint deformities.   Neurologically:  Mental status: The patient is awake, alert and oriented in all 4 spheres. Her immediate and remote memory, attention, language skills and fund of knowledge are fair.  Her daughter provides details of her history.   There is no evidence of aphasia, agnosia, apraxia or anomia. Speech is clear with normal prosody and enunciation. Thought process is linear. Mood is normal and affect is normal.      09/01/2022    1:58 PM  MMSE - Mini Mental State Exam  Orientation to time 4  Orientation to Place 4  Registration 3  Attention/ Calculation 1  Recall 1  Language- name 2 objects 2  Language- repeat 1  Language- follow 3 step command 3  Language- read & follow direction 1  Write a sentence 1  Copy design 0  Total score 21    On 09/01/2022: CDT: 1/4, AFT: 4/min.  Cranial nerves II - XII are as described above under HEENT exam.  Motor exam: Normal bulk, strength and tone is noted. There is no obvious action or resting tremor.  Fine motor skills and coordination:  grossly intact.  Cerebellar testing: No dysmetria or intention tremor. There is no truncal or gait ataxia.  Reflexes are 1+ throughout. Sensory exam: intact to light touch in the upper and lower extremities.  Gait, station and balance: She stands without difficulty, pushes herself up and requires no assistance, posture is age-appropriate.  She walks slightly cautiously and slowly, no walking aids, no shuffling, has preserved arm swing.   Assessment and Plan:   In summary, CAZANDRA TURO is a very pleasant 82 year old female with an underlying complex medical history of chronic atrial fibrillation on Coumadin, congestive heart failure, pulmonary hypertension, on home oxygen therapy, history of thrombocytopenia, arthritis, allergies, and history of sepsis with hospitalization in November 2023, who presents for evaluation of her memory loss of several months duration.  Memory loss was confounded by her acute illness in November 2023.  She does not have a very strong family history of Alzheimer's dementia, mom did have a diagnosis of ADD.  She has vascular risk factors and may have a combination type of memory loss, memory scores today are in the moderately abnormal range.  She had recent hallucinations but these may be in the context of recuperating from a acute serious illness.  She is encouraged to stay better hydrated with water and also restart her oxygen supplementation at night.  She was found to have low normal vitamin B12 level during the hospital stay and is advised to follow-up with primary care for recheck.  No history of thyroid abnormality.  She  was started on donepezil low-dose in January 2024 by primary care and she is encouraged to continue with it for now.  I would like to proceed with a brain MRI without contrast, her kidney function was impaired with acute kidney injury but creatinine level has not normalized yet.  She is scheduled to have an abdominal MRI because of a kidney lesion and this is  scheduled for later this month, we will try to coordinate with the brain MRI if possible.  We talked about the importance of maintaining a healthy lifestyle, good nutrition, good hydration.  We talked about the importance of physical activity and fall prevention as well.  This was an extended visit of over 60 minutes with extended chart review and counseling involved.  The patient has lost quite a bit of weight within the past year in the realm of 30 pounds.  She is supplementing with Ensure once a day.  I do believe it is important that she not stay by herself any longer, her son and his family are planning to move in with her this month.  She stayed with her daughter [name] since before her hospitalization in November.  Her other daughter lives further away in [place].  We will plan to follow-up routinely in about 3 to 4 months.  We will keep her posted as to her MRI results by phone call.  I answered all the questions today and the patient and her daughter were in agreement.   Star Age, MD, PhD

## 2022-09-01 NOTE — Telephone Encounter (Signed)
[employer] medicare sent to [facility] they obtain auth [id number] 

## 2022-09-02 ENCOUNTER — Other Ambulatory Visit (HOSPITAL_COMMUNITY): Payer: Self-pay

## 2022-09-04 ENCOUNTER — Other Ambulatory Visit (HOSPITAL_COMMUNITY): Payer: Self-pay

## 2022-09-06 ENCOUNTER — Other Ambulatory Visit: Payer: Self-pay | Admitting: Cardiology

## 2022-09-07 ENCOUNTER — Other Ambulatory Visit (HOSPITAL_COMMUNITY): Payer: Self-pay

## 2022-09-07 NOTE — Telephone Encounter (Signed)
Prescription refill request received for warfarin [place]: 03/31/2022 [facility] Next INR check: 10/2 Warfarin tablet strength: '5mg'$    Pt overdue for INR check. Pt has not had INR checked since 03/09/2022.  Called pt's daughter and [facility]

## 2022-09-09 ENCOUNTER — Telehealth: Payer: Self-pay | Admitting: *Deleted

## 2022-09-09 NOTE — Telephone Encounter (Addendum)
Pt's dtr returned call & stated the pt has an appt with Dr. Aundra Dubin tomorrow and will ask if they can draw the INR while there and send over. Advised that if they do we will need to know so we can follow up with the INR in a timely manner. Advised that we will need to discuss results and set up next follow up appointment and she verbalized understanding.  She states pt does have enough warfarin so refill is not needed.  Will follow up with pt/dtr tomorrow.

## 2022-09-10 ENCOUNTER — Encounter (HOSPITAL_COMMUNITY): Payer: Self-pay

## 2022-09-10 ENCOUNTER — Ambulatory Visit (HOSPITAL_COMMUNITY)
Admission: RE | Admit: 2022-09-10 | Discharge: 2022-09-10 | Disposition: A | Discharge status: Home / Self Care | Payer: Medicare HMO | Source: Ambulatory Visit | Attending: Adult Health | Admitting: Adult Health

## 2022-09-10 VITALS — BP 132/72 | HR 55 | Wt 131.8 lb

## 2022-09-10 DIAGNOSIS — I3139 Other pericardial effusion (noninflammatory): Secondary | ICD-10-CM | POA: Diagnosis not present

## 2022-09-10 DIAGNOSIS — I272 Pulmonary hypertension, unspecified: Secondary | ICD-10-CM

## 2022-09-10 DIAGNOSIS — I5032 Chronic diastolic (congestive) heart failure: Secondary | ICD-10-CM | POA: Insufficient documentation

## 2022-09-10 DIAGNOSIS — I2721 Secondary pulmonary arterial hypertension: Secondary | ICD-10-CM | POA: Diagnosis not present

## 2022-09-10 DIAGNOSIS — R0609 Other forms of dyspnea: Secondary | ICD-10-CM | POA: Insufficient documentation

## 2022-09-10 DIAGNOSIS — Z79899 Other long term (current) drug therapy: Secondary | ICD-10-CM | POA: Diagnosis not present

## 2022-09-10 DIAGNOSIS — I482 Chronic atrial fibrillation, unspecified: Secondary | ICD-10-CM | POA: Insufficient documentation

## 2022-09-10 DIAGNOSIS — E785 Hyperlipidemia, unspecified: Secondary | ICD-10-CM | POA: Diagnosis not present

## 2022-09-10 DIAGNOSIS — R0602 Shortness of breath: Secondary | ICD-10-CM | POA: Insufficient documentation

## 2022-09-10 DIAGNOSIS — I110 Hypertensive heart disease with heart failure: Secondary | ICD-10-CM | POA: Insufficient documentation

## 2022-09-10 DIAGNOSIS — Z7901 Long term (current) use of anticoagulants: Secondary | ICD-10-CM | POA: Insufficient documentation

## 2022-09-10 NOTE — Progress Notes (Signed)
6 Min Walk Test Completed  Pt ambulated 1200 ft to 365.7 meters O2 Sat ranged 86-98 on None : L oxygen HR ranged 79-92

## 2022-09-10 NOTE — Progress Notes (Signed)
Date:  09/10/2022   ID:  Vicki [name], DOB Sep 16, 1940, MRN [id number]   Provider location: [facility] Type of Visit: Established patient   PCP:  Vicki Pretty, MD  HF Cardiologist:  Dr. Aundra Dubin   HPI: Vicki [name] is a 82 y.o. female who has a history of chronic diastolic CHF, pulmonary hypertension and chronic atrial fibrillation.  Patient was followed by Dr. Glade Lloyd in the past for chronic atrial fibrillation.  She has been on coumadin.  She reports progressive exertional dyspnea since 2011.  This gradually worsened and became quite significant over the last few months.  She used to have significant HTN, but more recently her BP has been on the lower side.  Echo was done in 2/14, showing severe concentric LVH with EF 55-60%, moderately dilated RV, moderate to severe TR, and PA systolic pressure 86 mmHg.  I did a right heart cath in 5/14.  This showed PA pressure 104/36 with PCWP 20, suggesting pulmonary arterial HTN well out of proportion to the mildly elevate wedge pressure.  She was already on amlodipine so I did not do vasodilator testing.  V/Q scan was done, showing no evidence for chronic PEs.  PFTs showed a restrictive defect. Cardiac MRI did not show definite evidence for amyloid.  I started her on macitentan 10 mg daily.  Initially, she felt better on macitentan.  However, she was admitted in 5/14 from her sleep study due to orthopnea and dyspnea.  She was diuresed for several days and diuretic was switched over to torsemide.  I next started her on tadalafil 20 mg daily and titrated up to 40 mg daily.  She thinks that this helped.  She saw pulmonary after chest CT (showed mosaic attenuation in lungs).  This was thought to be due to air-trapping rather than ILD.  She was started on Spiriva. She wears oxygen at home.    She had an echocardiogram in 2/15 that showed severe LVH, EF 75%, small pericardial effusion, mildly dilated RV with mildly decreased systolic  function, PA systolic pressure 84 mmHg.  She is not totally sure if she was taking macitentan and tadalafil at that time.  She has had a hard month.  She ran out of her macitentan and tadalafil and her insurance company refused to refill them.  After this, she took a decided turn for the worse.  [place] her [place] in the fall.  She became much more short of breath, just with walking around the house. She developed lightheaded spells, especially with micturation.  Given lightheadedness and presyncope, she was admitted last week.  She was restarted on her medications and she finally got back on her meds at home.  In the hospital, she was noted to be bradycardic with HR to 30s so metoprolol was stopped.  Of note, her abdominal fat pad biopsy did not suggest amyloidosis. Repeat RHC in 3/15 still with moderate to severe PAH and low CI.  Holter off beta blocker in 3/15 showed average HR 73.    Echo in 9/17 showed EF 65-70% with moderate to severe LVH, moderate MR, normal RV size with mildly decreased systolic function, PASP 56 mmHg, small pericardial effusion.    Echo in 10/18 showed EF 60-65% with moderate LVH, moderate MR, severe biatrial enlargement, PASP 62 mmHg, RV normal.  PYP scan in 10/18 was not suggestive of TTR amyloidosis.   Echo in  10/19 showed EF 65-70%, moderate MR, normal RV size and systolic function, PASP 43 mmHg.   Echo in 12/20 showed EF 65-70%, moderate LVH, severe biatrial enlargement, mild-moderate MR, small pericardial effusion, unable to estimate PA systolic pressure.   PYP scan in 12/20 was visually grade 2 with H/CL 1.96 but activity appeared localized to the atria. Inv[employer]ae gene testing for TTR amyloidosis in 3/21 was negative.   Echo in 2/22 showed EF 60-65%, moderate LVH, normal RV, PASP 37 mmHg, moderate central MR.  PYP scan repeated 9/22 was grade 2 with H/CL 1.28 => isolated atrial uptake again.  Echo in 5/23  showed EF 60-65%, severe concentric LVH, normal RV, PASP 49 mmHg, moderate MR, small pericardial effusion.   Admitted 05/14/22 with sepsis in the setting of flu and pneumonia. Placed on Tamiflu and antibiotics. AHF consulted to help with HF/PH meds due to AKI. Discharged home, weight 137 lbs.  Today she returns for HF follow up with her son. Overall feeling fine. SOB with moderate exertion but resolves when she slows down. Denies PND/Orthopnea. Denies syncope/presyncope. Appetite ok. No fever or chills. Weight at home  pounds. Taking all medications.   6 minute walk (5/14): 122 m.   6 minute walk (7/14): 152 m 6 minute walk (10/14): 183 m 6 minute walk (4/15): 317 m 6 minute walk (12/15): 259 m 6 minute walk (4/16): 293 m 6 minute walk (8/16): 341 m 6 minute walk (12/16): 274 m 6 minute walk (6/17): 314 m 6 minute walk (9/17): 307 m 6 minute walk (4/18): 366 m 6 minute walk (10/18): 320 m 6 minute walk (3/19): 381 m 6 minute walk (9/19): 366 m 6 minute walk (9/20): 305 m 6 minute walk (12/20): 243 m 6 minute walk (6/21): 304 m 6 minute walk (11/21): 323 m 6 minute walk (2/22): 305 m 6 minute walk (5/22): 366 m 6 minute walk (1/23): 366 m 6 minute walk (5/23): 314 m   Labs (12/13): HCT 45.1, plts 153, K 3.5, creatinine 1.0 Labs (1/14): BNP 849 Labs (4/14): K 3.1, creatinine 1.0, ANA and anti-SCL-70 antibody negative.  Rheumatoid factor negative.  Serum immunofixation did not show monoclonal light chains.  Labs (5/14): K 3.3, creatinine 0.79, proBNP 5147 Labs (6/14): K 4, creatinine 0.9, BNP 1001 Labs (10/14): LDL 53, LDL-P 975, K 3.7, creatinine 1.2 Labs (11/14): K 3.7, creatinine 0.9, BNP 832 Labs (2/15): K 3.5, creatinine 1.10, HCT 42.6, UPEP negative, SPEP negative.  Labs (3/15): K 3.8, creatinine 0.88 Labs (4/15): K 3.7, creatinine 0.99, proBNP 1653 Labs (7/15): K 4, creatinine 0.93 Labs (12/15): LDL 77, HDL 58, K 3.9, creatinine 1.03, LFTs normal Labs (4/16): K 3.8,  creatinine 0.93, BNP 475, plts 105, HCT 42.3 Labs (11/16): K 3.6, creatinine 1.1, HCT 42.2 Labs (12/16): LDL 54, HDL 50, BNP 628 Labs (6/17): K 3.7, creatinine 0.97, HCT 41.3, BNP 489 Labs (9/17): K 3.8, creatinine 0.97, LDL 65, HDL 54 Labs (9/18): K 4.1, creatinine 1.05, hgb 14.6, BNP 646 Labs (10/18): BNP 588, TSH normal, LDL 73, HDL 48, K 3.5, creatinine 0.97, hgb 12.5, plts 91, LFTs normal Labs (3/19): K 4.1, creatinine 1.07, hgb 13.2 Labs (9/19): K 3.8, creatinine 1.1 Labs (7/20): K 3.9, creatinine 1.3, LDL 63 Labs (9/20): K 4.2, creatinine 1.05, hgb 12.6, BNP 632  Labs (12/20): Myeloma panel negative, urine immunofixation negative.  Labs (1/21): K 4.3, creatinine 1.12, LDL 63 Labs (3/21): K 3.8, creatinine 1.13 Labs (6/21): K 3.9, creatinine 1.16 Labs (11/21): LDL 63,  K 4.1, creatinine 1.19 Labs (2/22): K 4.3, creatinine 1.14, hgb 11.5, BNP 915 Labs (5/22): LDL 65 Labs (6/22): K 4, creatinine 1.16 Labs (8/22): K 3.6, creatinine 1.05, BNP 720 Labs (2/23): K 3.8, creatinine 1.26, LDL 62, BNP 623 Labs (5/23): K 3.5, creatinine 1.18, BNP 477 Labs (11/23): K 4.3, creatinine 1.22 Labs (07/26/22): K 3.6 Creatinine 1.46   PMH: 1. Chronic diastolic CHF: Echo (A999333) with EF 55-60%, severe LVH (no SAM, no asymmetric hypertrophy, no LVOT gradient), moderate-severe LAE, moderately dilated RV with mildly decreased systolic function, moderate to severe RAE, PA systolic pressure 86 mmHg, moderate-severe TR, moderate MR, trivial pericardial effusion.  Cardiac MRI (5/14): EF 65%, severe LVH, no definite evidence for amyloidosis (no delayed enhancement, myocardium not difficult to null).  Echo (2/15) with EF 75%, severe LVH, grade II diastolic dysfunction, moderate MR, RV mildly dilated with mildly decreased systolic function, moderate TR, PA systolic pressure 84 mmHg, small pericardial effusion. Abdominal fat pad biopsy (2/15) showed no evidence for amyloidosis.  SPEP/UPEP negative 2/15.  - Echo (4/16)  with EF 60-65%, severe LVH, RV mildly dilated with mildly decreased systolic function, PA systolic pressure 40 mmHg. - Echo (9/17) with EF 65-70%, severe LVH, moderate MR, normal RV size with mildly decreased systolic function, PASP 56 mmHg, small pericardial effusion.  - Echo (10/18): EF 60-65% with moderate LVH, moderate MR, severe biatrial enlargement, PASP 62 mmHg, RV normal. - PYP scan (10/18) was not suggestive of TTR amyloidosis.  - Echo (10/19): EF 65-70%, moderate MR, normal RV size and systolic function, PASP 43 mmHg. - Echo (12/20): EF 65-70%, moderate LVH, severe biatrial enlargement, mild-moderate MR, small pericardial effusion, unable to estimate PA systolic pressure.  - PYP scan (12/20): grade 2 with H/CL 1.96 but activity localized to the atria.  - Invitae gene testing for TTR amyloidosis in 3/21 was negative.  - Echo (2/22): EF 60-65%, moderate LVH, normal RV, PASP 37 mmHg, moderate central MR. - PYP scan (9/22): grade 2, H/CL 1.28 => isolated atrial uptake.  - Echo (5/23): EF 60-65%, severe concentric LVH, normal RV, PASP 49 mmHg, moderate MR, small pericardial effusion.  2. Chronic atrial fibrillation since around 2004.  Developed bradycardia and metoprolol stopped 2/15. Holter (3/15) with average HR 73, atrial fibrillation, 3.8 sec pause x 1 while asleep, PVCs.  3. HTN: For decades.  4. LHC (2/08) with no significant disease.  5. Chronic thrombocytopenia: ITP 6. Pulmonary arterial HTN: RHC (5/14) with mean RA 13, PA 104/36 (mean 63), mean PCWP 20 on right and 23 on left, CI 2.3 (Fick) and 1.6 (thermo), PVR 10.4 WU (Fick) and 15 WU (thermo).  Vasodilator testing not done as patient was already on amlodipine.  V/Q scan (5/14) with no evidence for chronic PE.  ANA, RF, and anti-SCL70 antibody negative.  PFTs (5/14) with FEV1 60%, FVC 54%, ratio 112%, TLC 61%, DLCO 43% => restrictive defect.  Sleep study (7/14) with no OSA.  CT chest with areas of mosaic attenuation in lungs (saw  pulmonary, thought air trapping and not ILD). RHC (3/15) with RA mean 5, PA 66/31 mean 43, PCWP mean 11, Cardiac Index (Fick) 1.97, PVR 9.2 WU.  Patient was started on Tyvaso in 3/15.  Echo (4/16) with mildly dilated and mildly dysfunctional RV, PA systolic pressure 40 mmHg.  7. Chest pain: Cardiolite 11/21 with no ischemia.   Current Outpatient Medications  Medication Sig Dispense Refill   acetaminophen (TYLENOL) 500 MG tablet Take 500 mg by mouth every 6 (six)  hours as needed for mild pain or headache.      busPIRone (BUSPAR) 5 MG tablet Take 5 mg by mouth every morning.     Cholecalciferol (VITAMIN D3) 2000 units TABS Take 1 tablet by mouth daily.      cyanocobalamin (VITAMIN B12) 1000 MCG tablet Take 1 tablet (1,000 mcg total) by mouth daily. 30 tablet 0   donepezil (ARICEPT) 5 MG tablet Take 5 mg by mouth daily.     KLOR-CON M20 20 MEQ tablet TAKE TWO TABLETS BY MOUTH IN THE MORNING AND ONE IN THE EVENING (Patient taking differently: Take 40 mEq by mouth every evening.) 90 tablet 0   levocetirizine (XYZAL) 5 MG tablet Take 2.5 mg by mouth daily in the afternoon.     macitentan (OPSUMIT) 10 MG tablet TAKE 1 TABLET (10 MG TOTAL) BY MOUTH DAILY WITH BREAKFAST. 90 tablet 3   polyethylene glycol (MIRALAX) 17 g packet Take 17 g by mouth daily. 30 each 0   psyllium (METAMUCIL) 58.6 % packet Take 1 packet by mouth daily. (Patient taking differently: Take 1 packet by mouth as needed.) 30 each 12   rosuvastatin (CRESTOR) 20 MG tablet Take 20 mg by mouth at bedtime.      SENNA PO Take by mouth as needed.     spironolactone (ALDACTONE) 25 MG tablet Take 0.5 tablets (12.5 mg total) by mouth daily. 30 tablet 6   tadalafil, PAH, (ADCIRCA) 20 MG tablet TAKE 2 TABLETS DAILY GENERIC FOR ADCIRCA (Patient taking differently: Take 40 mg by mouth every evening.) 60 tablet 11   Tafamidis (VYNDAMAX) 61 MG CAPS Take 1 capsule by mouth daily. (Patient taking differently: Take 61 mg by mouth daily.) 90 capsule 3    torsemide (DEMADEX) 20 MG tablet Take 1.5 tablets (30 mg total) by mouth 2 (two) times daily. 90 tablet 6   Treprostinil (TYVASO) 0.6 MG/ML SOLN Inhale 54 mcg into the lungs 4 (four) times daily. 30 mL 5   warfarin (COUMADIN) 5 MG tablet TAKE ONE TABLET BY MOUTH EVERY EVENING EXCEPT 1/2 TABLET ON MONDAYS OR AS DIRECTED 90 tablet 1   No current facility-administered medications for this encounter.    Allergies:   Patient has no known allergies.   Social History:  The patient  reports that she has never smoked. She has never used smokeless tobacco. She reports that she does not currently use alcohol. She reports that she does not use drugs.   Family History:  The patient's family history includes Alzheimer's disease in her mother; Heart Problems in her brother and maternal aunt; Heart attack in her father; Hypertension in her brother; Pulmonary Hypertension in her cousin; Thyroid disease in her daughter, daughter, and maternal aunt.   ROS:  Please see the history of present illness.   All other systems are personally reviewed and negative.   Recent Labs: 05/16/2022: TSH 0.814 05/19/2022: Magnesium 1.7 05/29/2022: B Natriuretic Peptide 828.0 07/26/2022: ALT 12; BUN 18; Creatinine, Ser 1.46; Hemoglobin 11.6; Platelets 125; Potassium 3.6; Sodium 139  Personally reviewed   BP 132/72   Pulse (!) 55   Wt 59.8 kg (131 lb 12.8 oz)   SpO2 93%   BMI 24.11 kg/m   Wt Readings from Last 3 Encounters:  09/10/22 59.8 kg (131 lb 12.8 oz)  09/01/22 61.2 kg (135 lb)  05/29/22 60.6 kg (133 lb 9.6 oz)   Physical Exam General:  Well appearing. No resp difficulty. Walked in the clinic HEENT: normal Neck: supple. no JVD. Carotids 2+  bilat; no bruits. No lymphadenopathy or thryomegaly appreciated. Cor: PMI nondisplaced. Irregular rate & rhythm. No rubs, gallops or murmurs. Lungs: clear Abdomen: soft, nontender, nondistended. No hepatosplenomegaly. No bruits or masses. Good bowel sounds. Extremities: no  cyanosis, clubbing, rash, edema Neuro: alert & orientedx3, cranial nerves grossly intact. moves all 4 extremities w/o difficulty. Affect pleasant   Assessment & Plan 1. Pulmonary HTN: Patient has severe pulmonary arterial HTN.  RHC in 3/15 showed CI 1.97, PVR 9. 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 Collagen vascular disease workup was negative (negative RF, ANA and negative anti-SCL-70).  V/Q scan was not suggestive of chronic PEs.  PFTs were suggestive of restrictive lung disease but CT chest and evaluation by pulmonary did not suggest interstitial lung disease.  Sleep study did not show OSA.  Echo in 3/22 showed normal RV size and systolic function, PASP estimation 37 mmHg.  Echo 5/23 showed EF 60-65%, severe concentric LVH, normal RV, PASP 49 mmHg, moderate MR, small pericardial effusion.  - Continue macitentan, Adcirca, and Tyvaso.  - Obtain 6MW today.  2. Chronic diastolic CHF: EF preserved on echo with moderate concentric LVH and a small pericardial effusion.  It is possible that the LVH is due to years of HTN.  The cardiac MRI was not definitively suggestive of cardiac amyloidosis.  I was still concerned for amyloidosis given the appearance of the LV myocardium on echoes.  Abdominal fat pad biopsy in 2015 showed no evidence for amyloidosis. She has some symptoms suggestive of mild peripheral neuropathy.  PYP scan in 10/18 was not suggestive of TTR amyloidosis. I assessed her again for cardiac amyloidosis in 2020: myeloma panel and urine immunofixation negative, PYP scan in 12/20 was grade 2 with H/CL 1.97 but activity was localized to the atria.  Inv[employer]ae gene testing for TTR amyloidosis was negative.  PYP scan repeated 9/22 was grade 2 with H/CL 1.28 => isolated atrial uptake again.  Echo in 5/23 was consistent with cardiac amyloidosis, EF 60-65%, severe concentric LVH, normal RV, PASP 49 mmHg, moderate MR, small pericardial effusion. She does not look volume overloaded on exam.  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 This may be an earlier manifestation before LV involvement.  This can be associated with atrial fibrillation. She will continue tafamidis.   - NYHA II-III. Volume status stable. Continue torsemide 30 mg bid. - Continue spiro 12.5 mg daily. 3. HTN: Stable   - Previously on amlodipine. Can add back later for BP control if needed. 4. Chronic atrial fibrillation: Continue coumadin.  She is off metoprolol due to bradycardia. Holter off metoprolol did not show any high rate episodes.  5. Hyperlipidemia: On Crestor. Good lipids 2/23. 6. Left Renal Mass: MR of abdomen with findings suspicious for hemorrhagic cyst. - She has follow up with Urology, Dr. Cain Sieve soon.  6MW  Follow up in 3 months with Dr Aundra Dubin and an ECHO.    Archie Atilano NP-C  09/10/2022  [facility] [place] [address] [place] [address] [phone] (office) [phone] (fax)

## 2022-09-10 NOTE — Telephone Encounter (Signed)
Pt and son showed up in the waiting room. Spoke with the son (pt was in the bathroom at the beginning but later joined) and he states that the lab at the hospital was closed so they were unable to draw the INR and they figured they would just drive by here and take a chance. Advised we have appointments and only available opening is at 230pm and the lab does not reopen until 2pm post lunch. He stated to contact his sister to set up an appointment. Called pt's dtr in reference to the pt having INR drawn versus POC INR after the pt and son walked in to the clinic without an appt. Left message for the dtr to call back. Son & pt states she has enough warfarin at home.

## 2022-09-10 NOTE — Telephone Encounter (Signed)
Pt's daughter returned call and scheduled anticoagulation clinic appt for 2:00pm on 09/21/22. Made of her aware of the importance of pt coming to this appt. Verbalized understanding.

## 2022-09-10 NOTE — Patient Instructions (Signed)
It was great to see you today! No medication changes are needed at this time.  Your physician wants you to follow-up in: 3 months with Dr Aundra Dubin and echo. You will receive a reminder letter in the mail two months in advance. If you don't receive a letter, please call our office to schedule the follow-up appointment.  Your physician has requested that you have an echocardiogram. Echocardiography is a painless test that uses sound waves to create images of your heart. It provides your doctor with information about the size and shape of your heart and how well your heart's chambers and valves are working. This procedure takes approximately one hour. There are no restrictions for this procedure. Please do NOT wear cologne, perfume, aftershave, or lotions (deodorant is allowed). Please arrive 15 minutes prior to your appointment time.   Do the following things EVERYDAY: Weigh yourself in the morning before breakfast. Write it down and keep it in a log. Take your medicines as prescribed Eat low salt foods--Limit salt (sodium) to 2000 mg per day.  Stay as active as you can everyday Limit all fluids for the day to less than 2 liters  At the [facility], you and your health needs are our priority. As part of our continuing mission to provide you with exceptional heart care, we have created designated Provider Care Teams. These Care Teams include your primary Cardiologist (physician) and Advanced Practice Providers (APPs- Physician Assistants and Nurse Practitioners) who all work together to provide you with the care you need, when you need it.   You may see any of the following providers on your designated Care Team at your next follow up: Dr Glori Bickers Dr [name] Dr. Roxana Hires, NP Lyda Jester, [place] [facility] [place], [place] Forestine Na, NP [name], PharmD   Please be sure to bring in all your medications bottles to every  appointment.    Thank you for choosing [facility]

## 2022-09-12 ENCOUNTER — Other Ambulatory Visit (HOSPITAL_COMMUNITY): Payer: Self-pay | Admitting: Cardiology

## 2022-09-12 DIAGNOSIS — I4891 Unspecified atrial fibrillation: Secondary | ICD-10-CM

## 2022-09-12 DIAGNOSIS — Z9861 Coronary angioplasty status: Secondary | ICD-10-CM

## 2022-09-14 ENCOUNTER — Encounter: Payer: Self-pay | Admitting: Neurology

## 2022-09-15 DIAGNOSIS — R413 Other amnesia: Secondary | ICD-10-CM | POA: Diagnosis not present

## 2022-09-15 DIAGNOSIS — Z23 Encounter for immunization: Secondary | ICD-10-CM | POA: Diagnosis not present

## 2022-09-15 DIAGNOSIS — I4891 Unspecified atrial fibrillation: Secondary | ICD-10-CM | POA: Diagnosis not present

## 2022-09-17 ENCOUNTER — Ambulatory Visit
Admission: RE | Admit: 2022-09-17 | Discharge: 2022-09-17 | Disposition: A | Discharge status: Home / Self Care | Payer: Medicare HMO | Source: Ambulatory Visit | Attending: Urology | Admitting: Urology

## 2022-09-17 DIAGNOSIS — D4102 Neoplasm of uncertain behavior of left kidney: Secondary | ICD-10-CM

## 2022-09-17 DIAGNOSIS — N2889 Other specified disorders of kidney and ureter: Secondary | ICD-10-CM | POA: Diagnosis not present

## 2022-09-17 MED ORDER — GADOPICLENOL 0.5 MMOL/ML IV SOLN
6.0000 mL | Freq: Once | INTRAVENOUS | Status: AC | PRN
  Administered 2022-09-17: 6 mL via INTRAVENOUS

## 2022-09-21 ENCOUNTER — Ambulatory Visit: Payer: Medicare HMO | Attending: Cardiovascular Disease

## 2022-09-21 DIAGNOSIS — H35071 Retinal telangiectasis, right eye: Secondary | ICD-10-CM | POA: Diagnosis not present

## 2022-09-21 DIAGNOSIS — H25812 Combined forms of age-related cataract, left eye: Secondary | ICD-10-CM | POA: Diagnosis not present

## 2022-09-21 DIAGNOSIS — I4891 Unspecified atrial fibrillation: Secondary | ICD-10-CM

## 2022-09-21 DIAGNOSIS — Z961 Presence of intraocular lens: Secondary | ICD-10-CM | POA: Diagnosis not present

## 2022-09-21 DIAGNOSIS — Z5181 Encounter for therapeutic drug level monitoring: Secondary | ICD-10-CM | POA: Diagnosis not present

## 2022-09-21 DIAGNOSIS — H35341 Macular cyst, hole, or pseudohole, right eye: Secondary | ICD-10-CM | POA: Diagnosis not present

## 2022-09-21 DIAGNOSIS — H40013 Open angle with borderline findings, low risk, bilateral: Secondary | ICD-10-CM | POA: Diagnosis not present

## 2022-09-21 DIAGNOSIS — H35051 Retinal neovascularization, unspecified, right eye: Secondary | ICD-10-CM | POA: Diagnosis not present

## 2022-09-21 LAB — POCT INR: INR: 1.5 — AB (ref 2.0–3.0)

## 2022-09-21 NOTE — Patient Instructions (Signed)
Description   START taking warfarin 1 tablet daily   Recheck INR in 2 weeks.  Coumadin Clinic [phone]

## 2022-09-22 ENCOUNTER — Other Ambulatory Visit (HOSPITAL_COMMUNITY): Payer: Self-pay

## 2022-09-23 ENCOUNTER — Ambulatory Visit
Admission: RE | Admit: 2022-09-23 | Discharge: 2022-09-23 | Disposition: A | Discharge status: Home / Self Care | Payer: Medicare HMO | Source: Ambulatory Visit | Attending: Neurology | Admitting: Neurology

## 2022-09-23 DIAGNOSIS — R443 Hallucinations, unspecified: Secondary | ICD-10-CM | POA: Diagnosis not present

## 2022-09-23 DIAGNOSIS — Z9981 Dependence on supplemental oxygen: Secondary | ICD-10-CM | POA: Diagnosis not present

## 2022-09-23 DIAGNOSIS — R413 Other amnesia: Secondary | ICD-10-CM | POA: Diagnosis not present

## 2022-09-23 DIAGNOSIS — Z820 Family history of epilepsy and other diseases of the nervous system: Secondary | ICD-10-CM | POA: Diagnosis not present

## 2022-09-23 DIAGNOSIS — Z9289 Personal history of other medical treatment: Secondary | ICD-10-CM | POA: Diagnosis not present

## 2022-09-24 DIAGNOSIS — I509 Heart failure, unspecified: Secondary | ICD-10-CM | POA: Diagnosis not present

## 2022-09-24 DIAGNOSIS — N281 Cyst of kidney, acquired: Secondary | ICD-10-CM | POA: Diagnosis not present

## 2022-09-27 DIAGNOSIS — I130 Hypertensive heart and chronic kidney disease with heart failure and stage 1 through stage 4 chronic kidney disease, or unspecified chronic kidney disease: Secondary | ICD-10-CM | POA: Diagnosis not present

## 2022-09-27 DIAGNOSIS — I5032 Chronic diastolic (congestive) heart failure: Secondary | ICD-10-CM | POA: Diagnosis not present

## 2022-09-27 DIAGNOSIS — I482 Chronic atrial fibrillation, unspecified: Secondary | ICD-10-CM | POA: Diagnosis not present

## 2022-09-27 DIAGNOSIS — N1831 Chronic kidney disease, stage 3a: Secondary | ICD-10-CM | POA: Diagnosis not present

## 2022-10-01 ENCOUNTER — Telehealth: Payer: Self-pay | Admitting: *Deleted

## 2022-10-01 NOTE — Telephone Encounter (Signed)
Called daughter [name] and LVM (ok per [facility]) advising her of pt's MRI brain results as noted below by Dr Rexene Alberts.  I left the office number and hours in case she would like to call back with questions.

## 2022-10-01 NOTE — Telephone Encounter (Signed)
-----   Message from Star Age, MD sent at 09/28/2022  8:20 AM EDT ----- Please call patient or family member on DPR about her brain MRI results: No acute findings were seen, mild atrophy was noted, which is volume loss, she also has chronic changes that occur over time such as hardening of the arteries.

## 2022-10-05 ENCOUNTER — Ambulatory Visit: Payer: Medicare HMO | Attending: Internal Medicine

## 2022-10-05 ENCOUNTER — Other Ambulatory Visit: Payer: Self-pay | Admitting: Cardiology

## 2022-10-05 DIAGNOSIS — Z5181 Encounter for therapeutic drug level monitoring: Secondary | ICD-10-CM | POA: Diagnosis not present

## 2022-10-05 DIAGNOSIS — I4891 Unspecified atrial fibrillation: Secondary | ICD-10-CM | POA: Diagnosis not present

## 2022-10-05 LAB — POCT INR: INR: 1.7 — AB (ref 2.0–3.0)

## 2022-10-05 NOTE — Patient Instructions (Signed)
Description   Take 1.5 tablets today and then START taking warfarin 1 tablet daily EXCEPT 1.5 tablets on Sundays.  Recheck INR in 2 weeks.  Coumadin Clinic [phone]

## 2022-10-05 NOTE — Telephone Encounter (Signed)
Prescription refill request received for warfarin Lov: Mclean, 03/31/2022 Next INR check: 4/8 Warfarin tablet strength: 5mg 

## 2022-10-07 DIAGNOSIS — R031 Nonspecific low blood-pressure reading: Secondary | ICD-10-CM | POA: Diagnosis not present

## 2022-10-07 DIAGNOSIS — R531 Weakness: Secondary | ICD-10-CM | POA: Diagnosis not present

## 2022-10-07 DIAGNOSIS — R42 Dizziness and giddiness: Secondary | ICD-10-CM | POA: Diagnosis not present

## 2022-10-07 DIAGNOSIS — M549 Dorsalgia, unspecified: Secondary | ICD-10-CM | POA: Diagnosis not present

## 2022-10-15 DIAGNOSIS — I110 Hypertensive heart disease with heart failure: Secondary | ICD-10-CM | POA: Diagnosis not present

## 2022-10-15 DIAGNOSIS — I272 Pulmonary hypertension, unspecified: Secondary | ICD-10-CM | POA: Diagnosis not present

## 2022-10-15 DIAGNOSIS — E860 Dehydration: Secondary | ICD-10-CM | POA: Diagnosis not present

## 2022-10-15 DIAGNOSIS — I50812 Chronic right heart failure: Secondary | ICD-10-CM | POA: Diagnosis not present

## 2022-10-15 DIAGNOSIS — N289 Disorder of kidney and ureter, unspecified: Secondary | ICD-10-CM | POA: Diagnosis not present

## 2022-10-19 ENCOUNTER — Ambulatory Visit: Payer: Medicare HMO | Attending: Cardiovascular Disease | Admitting: Pharmacist

## 2022-10-19 DIAGNOSIS — I4891 Unspecified atrial fibrillation: Secondary | ICD-10-CM

## 2022-10-19 DIAGNOSIS — Z5181 Encounter for therapeutic drug level monitoring: Secondary | ICD-10-CM

## 2022-10-19 LAB — POCT INR: INR: 3.5 — AB (ref 2.0–3.0)

## 2022-10-19 NOTE — Patient Instructions (Addendum)
Description   Skip your warfarin today, then go back to taking 1 tablet every day of the week. Recheck INR in 3 weeks Cou[facility] [phone]

## 2022-10-22 DIAGNOSIS — I10 Essential (primary) hypertension: Secondary | ICD-10-CM | POA: Diagnosis not present

## 2022-10-25 DIAGNOSIS — I509 Heart failure, unspecified: Secondary | ICD-10-CM | POA: Diagnosis not present

## 2022-10-26 DIAGNOSIS — I10 Essential (primary) hypertension: Secondary | ICD-10-CM | POA: Diagnosis not present

## 2022-10-26 DIAGNOSIS — I110 Hypertensive heart disease with heart failure: Secondary | ICD-10-CM | POA: Diagnosis not present

## 2022-10-26 DIAGNOSIS — N289 Disorder of kidney and ureter, unspecified: Secondary | ICD-10-CM | POA: Diagnosis not present

## 2022-11-09 ENCOUNTER — Other Ambulatory Visit (HOSPITAL_COMMUNITY): Payer: Self-pay | Admitting: Cardiology

## 2022-11-09 ENCOUNTER — Other Ambulatory Visit: Payer: Self-pay

## 2022-11-09 ENCOUNTER — Ambulatory Visit: Payer: Medicare HMO | Attending: Internal Medicine | Admitting: *Deleted

## 2022-11-09 ENCOUNTER — Other Ambulatory Visit (HOSPITAL_COMMUNITY): Payer: Self-pay

## 2022-11-09 DIAGNOSIS — I4891 Unspecified atrial fibrillation: Secondary | ICD-10-CM

## 2022-11-09 DIAGNOSIS — Z5181 Encounter for therapeutic drug level monitoring: Secondary | ICD-10-CM

## 2022-11-09 LAB — POCT INR: INR: 4.6 — AB (ref 2.0–3.0)

## 2022-11-09 MED ORDER — VYNDAMAX 61 MG PO CAPS
1.0000 | ORAL_CAPSULE | Freq: Every day | ORAL | 3 refills | Status: DC
  Filled 2022-11-09 – 2022-11-16 (×2): qty 90, 90d supply, fill #0
  Filled 2023-02-22: qty 90, 90d supply, fill #1
  Filled 2023-05-20: qty 90, 90d supply, fill #2
  Filled 2023-08-06: qty 90, 90d supply, fill #3

## 2022-11-09 NOTE — Patient Instructions (Signed)
Description   Do not take any warfarin today and no warfarin tomorrow then START taking 1 tablet daily except 1/2 tablet on Mondays. Recheck INR in 10 days. Add one serving of broccoli one day a week. Coumadin Clinic [phone]

## 2022-11-12 DIAGNOSIS — Z0289 Encounter for other administrative examinations: Secondary | ICD-10-CM | POA: Diagnosis not present

## 2022-11-12 DIAGNOSIS — R4586 Emotional lability: Secondary | ICD-10-CM | POA: Diagnosis not present

## 2022-11-12 DIAGNOSIS — R630 Anorexia: Secondary | ICD-10-CM | POA: Diagnosis not present

## 2022-11-16 ENCOUNTER — Other Ambulatory Visit: Payer: Self-pay

## 2022-11-16 ENCOUNTER — Other Ambulatory Visit (HOSPITAL_COMMUNITY): Payer: Self-pay

## 2022-11-16 ENCOUNTER — Telehealth (HOSPITAL_COMMUNITY): Payer: Self-pay | Admitting: Pharmacy Technician

## 2022-11-16 NOTE — Telephone Encounter (Signed)
Advanced Heart Failure Patient Advocate Encounter  Received a message from the specialty pharmacy - they have been attempting contact with the patient for a refill of Vyndamax. She has not filled since her 30 day supply in Feb. Called and spoke with the patient. She forgot that she was taking this medication. She is aware that the pharmacy will send her a 90 day supply and we are restarting the medication.   Called and left Jacki Cones a message with this update as well.  Archer Asa, CPhT

## 2022-11-17 ENCOUNTER — Other Ambulatory Visit (HOSPITAL_COMMUNITY): Payer: Self-pay

## 2022-11-19 ENCOUNTER — Ambulatory Visit: Payer: Medicare HMO | Attending: Cardiology | Admitting: *Deleted

## 2022-11-19 DIAGNOSIS — Z5181 Encounter for therapeutic drug level monitoring: Secondary | ICD-10-CM | POA: Diagnosis not present

## 2022-11-19 DIAGNOSIS — I4891 Unspecified atrial fibrillation: Secondary | ICD-10-CM | POA: Diagnosis not present

## 2022-11-19 LAB — POCT INR: POC INR: 1.6

## 2022-11-19 NOTE — Patient Instructions (Signed)
Description   Take 1.5 tablets of warfarin today and tomorrow then continue to take warfarin 1 tablet daily except for 1/2 a tablet on Mondays. Recheck INR in 2 weeks. Continue eating 1 serving of broccoli per week.  Coumadin Clinic [phone]

## 2022-11-24 DIAGNOSIS — I509 Heart failure, unspecified: Secondary | ICD-10-CM | POA: Diagnosis not present

## 2022-12-03 ENCOUNTER — Ambulatory Visit: Payer: Medicare HMO | Attending: Cardiology

## 2022-12-03 DIAGNOSIS — Z5181 Encounter for therapeutic drug level monitoring: Secondary | ICD-10-CM

## 2022-12-03 DIAGNOSIS — I4891 Unspecified atrial fibrillation: Secondary | ICD-10-CM | POA: Diagnosis not present

## 2022-12-03 LAB — POCT INR: INR: 4.3 — AB (ref 2.0–3.0)

## 2022-12-03 NOTE — Patient Instructions (Signed)
Description   HOLD today's dose and then START taking warfarin 1 tablet daily except for 1/2 a tablet on Mondays and Fridays.  Stay consistent with greens and boost each week:  Boost 1 time per week  & Greens 1-2 per week  Recheck INR in 2 weeks.  Coumadin Clinic [phone]

## 2022-12-10 ENCOUNTER — Telehealth: Payer: Self-pay | Admitting: *Deleted

## 2022-12-10 NOTE — Progress Notes (Signed)
  Care Coordination  Outreach Note  12/10/2022 Name: Vicki [name] MRN: [id number] DOB: 29-Aug-1940   Care Coordination Outreach Attempts: An unsuccessful telephone outreach was attempted today to offer the patient information about available care coordination services.  Follow Up Plan:  Additional outreach attempts will be made to offer the patient care coordination information and services.   Encounter Outcome:  Pt. Request to Call [facility] will call back to schedule   [facility] Coordination Care Guide  Direct Dial: [phone]

## 2022-12-14 ENCOUNTER — Other Ambulatory Visit: Payer: Self-pay | Admitting: Cardiology

## 2022-12-14 NOTE — Telephone Encounter (Signed)
Refill request for warfarin:  Last INR was 4.3 on 12/03/22 Next INR due 12/14/22 LOV was 05/29/22  Refill approved.

## 2022-12-15 ENCOUNTER — Encounter: Payer: Self-pay | Admitting: Neurology

## 2022-12-15 ENCOUNTER — Ambulatory Visit: Payer: Medicare HMO

## 2022-12-15 ENCOUNTER — Ambulatory Visit: Payer: Medicare HMO | Admitting: Neurology

## 2022-12-15 VITALS — BP 113/54 | HR 46 | Ht 60.0 in | Wt 127.0 lb

## 2022-12-15 DIAGNOSIS — R413 Other amnesia: Secondary | ICD-10-CM

## 2022-12-15 NOTE — Patient Instructions (Addendum)
It was nice to see you again today.  I am glad to hear that you feel stable.  I recommend you continue with your current medication, including the donepezil 5 mg daily. I would be cautious with increasing the donepezil, as it can cause low heart rate (what we call bradycardia), and your pulse rate today was 46.

## 2022-12-15 NOTE — Progress Notes (Signed)
Subjective:    Patient ID: Vicki [name] is a 82 y.o. female.  HPI    Interim history:   Vicki [name] is an 82 year old female with an underlying complex medical history of chronic atrial fibrillation on coumadin, congestive heart failure, pulmonary hypertension, on home oxygen therapy, history of thrombocytopenia, arthritis, allergies, and history of sepsis with hospitalization in November 2023, who presents for follow-up consultation on her memory loss.  The patient is accompanied by her son today.  I first met her as a referral from the emergency room for memory loss on 09/01/2022.  Her MMSE was 21 at the time.  She was on donepezil per PCP.  We talked about the importance of good hydration and good nutrition, she was advised to follow-up with PCP regarding her vitamin B12 deficiency.  I suggested we proceed with a brain MRI.  She had a brain MRI without contrast on 09/23/2022 and I reviewed the results; in addition, I personally reviewed the images through the PACS system/[employer] [facility]:   IMPRESSION:    MRI brain without contrast demonstrating: -Mild chronic small vessel ischemic disease. -Mild generalized atrophy. -No acute findings.  Patient's daughter was notified by phone call.     Today, 12/15/2022 (all dictated new, as well as above notes, some dictation done in note pad or Word, outside of chart, may appear as copied):   She reports feeling stable, her son also reports that for the most part things are stable, he and his sister are in the process of enrolling her in a day program, [facility] in [place].  Her son has moved in with her.  She has had 1 episode of mood irritability around Mother's Day but overall is doing well, tolerates her medications, still takes donepezil 5 mg daily per PCP.  She hydrates well with water.  They had to stop giving her Ensure because it interferes with the  The patient's allergies, current medications, family history, past medical  history, past social history, past surgical history and problem list were reviewed and updated as appropriate.    Previously:   09/01/22: (She) reports being forgetful for the past several months.  Daughter reports that she has had some recent auditory and visual hallucinations but this was since her hospitalization in November and she went back to the hospital in January 2024.  She had a recent follow-up with primary care and was started on donepezil 5 mg once daily at bedtime in January 2024.  She takes vitamin B12 by mouth.  She was staying with her daughter for about a month prior to her acute illness right before Thanksgiving.  When she came home from the hospital she was staying with her daughter but then eventually moved back into her home.  She has been living by herself since 2021.  She lost her husband in 2014 but someone stayed with her more or less consistently until 2021.  She quit driving in 0981 or 1914 as she was more confused while driving.  She reports that her mom had memory loss, daughter reports that patient's mom had Alzheimer's dementia and lived to be in her early 49s.  Patient's father died in his early 19s from heart attack.  Patient is the second oldest of a total of 9 siblings, all brothers have passed away.  She has 3 sisters alive including her oldest sister who is 2.  None of her siblings have or had memory loss.  Patient worked for [employer] of [place] for  over 40 years.  She has 3 children.  Her daughter Misty Stanley lives in [place], daughter Jacki Cones, who is here today lives locally and her son, AL, lives locally as well and is getting ready with his family to move into patient's home so she does not stay by herself. The patient is aware of her hallucinations recently and has good insight. Of note, she does not drink a whole lot of water, maybe 3 to 4 cups/day.  She does drink a little bit of soda, usually 1 bottle of Sprite, she drinks alcohol very rarely, maybe on special occasions.  She  is a non-smoker.  She has not fallen recently but had to catch herself recently.  She does not use a walking aid typically.   She was hospitalized from 05/14/2022 through 05/19/2022 with influenza A with pneumonia and sepsis, she had complication with acute kidney injury.  She has a history of chronic respiratory failure with hypoxia with oxygen requirement.  She was found to have hypokalemia and chronic A-fib, has a history of cardiac amyloidosis and is followed by cardiology.  Hospitalization was complicated by obstipation.  She had elevated troponin which was felt to be related to demand ischemia.  A referral to neurology was placed but not mentioned in the discharge summary.  I reviewed the hospital records, including testing and blood test results.  Vitamin B12 level on 05/17/2022 was 269.  She had a head CT without contrast on 05/16/2022 and I reviewed the results, indication was altered mental status: Impression: Aging brain without acute or reversible finding. She had presented to the emergency room on 05/14/2022 with increasing cough and back pain as well as generalized weakness.    Of note, she presented to the emergency room on 07/26/2022 with diarrhea, rectal bleeding and rectal irritation.     She is scheduled for an MRI of the abdomen as she was found to have a lesion on the left kidney.  Of note, she was also found to have lung nodules on her abdominal CT scan from January 2024.  She reports that she has not been using her nighttime oxygen but would be willing to go back on it.  Her Past Medical History Is Significant For: Past Medical History:  Diagnosis Date   Arthritis    elbow   Atrial fibrillation (HCC)    Cancer (HCC)    Mylenoma- top of head   CHF (congestive heart failure) (HCC)    Dysrhythmia    Heart murmur    On home oxygen therapy    4 bliters   Pulmonary hypertension (HCC)    Seasonal allergies    Thrombocytopenia (HCC) 7/16    Her Past Surgical History Is  Significant For: Past Surgical History:  Procedure Laterality Date   BREAST LUMPECTOMY Right    benign   CARDIAC CATHETERIZATION  08/12/2006   no significant disease   CARDIAC CATHETERIZATION  10/28/2012   right heart cath done   DOPPLER ECHOCARDIOGRAPHY  Aug 09 2012   severe LVH  with mild cavity obliteration. EF 55-60% without normal relaxationbut difficult to really tell total diastolic function due to a fib; sclerotic aortic valve with no stenosis. mod mitral regurg. mod to severely dilated left atrium; mod dilated rgt right ventricle w/elevated pressures, estimated peak PA presures @86mmHG  TR jet calculation. RGT ATRIUM MOD to SEVERE,     NM MYOCAR PERF WALL MOTION  2004   EXERCISE  ,no ischemia   PARS PLANA VITRECTOMY Right 04/03/2015   Procedure:  PARS PLANA VITRECTOMY WITH 25 GAUGE FOR VETREOUS HEMORRHAGE, ENDO LASER;  Surgeon: Carmela Rima, MD;  Location: [facility] OR;  Service: Ophthalmology;  Laterality: Right;   RIGHT HEART CATHETERIZATION N/A 09/07/2013   Procedure: RIGHT HEART CATH;  Surgeon: Laurey Morale, MD;  Location: [facility] CATH LAB;  Service: Cardiovascular;  Laterality: N/A;   VAGINAL HYSTERECTOMY      Her Family History Is Significant For: Family History  Problem Relation Age of Onset   Heart Problems Brother    Hypertension Brother    Alzheimer's disease Mother    Heart attack Father    Thyroid disease Maternal Aunt    Pulmonary Hypertension Cousin    Heart Problems Maternal Aunt    Thyroid disease Daughter    Thyroid disease Daughter     Her Social History Is Significant For: Social History   Socioeconomic History   Marital status: Widowed    Spouse name: Not on file   Number of children: Not on file   Years of education: Not on file   Highest education level: Not on file  Occupational History   Occupation: retired  Tobacco Use   Smoking status: Never   Smokeless tobacco: Never  Substance and Sexual Activity   Alcohol use: Not Currently    Comment: occ    Drug use: No   Sexual activity: Not on file  Other Topics Concern   Not on file  Social History Narrative   Not on file   Social Determinants of Health   Financial Resource Strain: Low Risk  (05/19/2022)   Overall Financial Resource Strain (CARDIA)    Difficulty of Paying Living Expenses: Not hard at all  Food Insecurity: No Food Insecurity (05/19/2022)   Hunger Vital Sign    Worried About Running Out of Food in the Last Year: Never true    Ran Out of Food in the Last Year: Never true  Transportation Needs: No Transportation Needs (05/25/2022)   PRAPARE - Administrator, Civil Service (Medical): No    Lack of Transportation (Non-Medical): No  Physical Activity: Not on file  Stress: Not on file  Social Connections: Not on file    Her Allergies Are:  No Known Allergies:   Her Current Medications Are:  Outpatient Encounter Medications as of 12/15/2022  Medication Sig   busPIRone (BUSPAR) 5 MG tablet Take 5 mg by mouth every morning.   Cholecalciferol (VITAMIN D3) 2000 units TABS Take 1 tablet by mouth daily.    cyanocobalamin (VITAMIN B12) 1000 MCG tablet Take 1 tablet (1,000 mcg total) by mouth daily.   donepezil (ARICEPT) 5 MG tablet Take 5 mg by mouth daily.   KLOR-CON M20 20 MEQ tablet TAKE TWO TABLETS BY MOUTH IN THE MORNING AND ONE IN THE EVENING (Patient taking differently: Take 40 mEq by mouth every evening.)   levocetirizine (XYZAL) 5 MG tablet Take 2.5 mg by mouth daily in the afternoon.   macitentan (OPSUMIT) 10 MG tablet TAKE 1 TABLET (10MG ) BY MOUTH DAILY WITH BREAKFAST   polyethylene glycol (MIRALAX) 17 g packet Take 17 g by mouth daily.   psyllium (METAMUCIL) 58.6 % packet Take 1 packet by mouth daily. (Patient taking differently: Take 1 packet by mouth as needed.)   rosuvastatin (CRESTOR) 20 MG tablet Take 20 mg by mouth at bedtime.    SENNA PO Take by mouth as needed.   spironolactone (ALDACTONE) 25 MG tablet Take 0.5 tablets (12.5 mg total) by mouth  daily.   tadalafil,  PAH, (ADCIRCA) 20 MG tablet TAKE 2 TABLETS DAILY GENERIC FOR ADCIRCA (Patient taking differently: Take 40 mg by mouth every evening.)   Tafamidis (VYNDAMAX) 61 MG CAPS Take 1 capsule by mouth daily.   torsemide (DEMADEX) 20 MG tablet Take 1.5 tablets (30 mg total) by mouth 2 (two) times daily.   Treprostinil (TYVASO) 0.6 MG/ML SOLN Inhale 54 mcg into the lungs 4 (four) times daily.   warfarin (COUMADIN) 5 MG tablet TAKE 1/2 TO 1 TABLET BY MOUTH daily AS DIRECTED by THE coumadin clinic   acetaminophen (TYLENOL) 500 MG tablet Take 500 mg by mouth every 6 (six) hours as needed for mild pain or headache.    No facility-administered encounter medications on file as of 12/15/2022.  :  Review of Systems:  Out of a complete 14 point review of systems, all are reviewed and negative with the exception of these symptoms as listed below:   Review of Systems  Neurological:        Pt here for memory f/u  Pt and son states same since last visit     Objective:  Neurological Exam  Physical Exam Physical Examination:   Vitals:   12/15/22 1308  BP: (!) 113/54  Pulse: (!) 46    General Examination: The patient is a very pleasant 82 y.o. female in no acute distress. She appears well-developed and well-nourished and well groomed.   HEENT: Normocephalic, atraumatic, pupils are equal, round and reactive to light, extraocular tracking is good without limitation to gaze excursion or nystagmus noted.  No facial masking noted, speech is clear without dysarthria, hypophonia or voice tremor.  Airway examination reveals mild to moderate mouth dryness, adequate dental hygiene.  Tongue protrudes centrally and palate elevates symmetrically.  Hearing is grossly intact.  No carotid bruits.     Chest: Clear to auscultation without wheezing, rhonchi or crackles noted.   Heart: S1+S2+0, irregularly irregular, she has a prominent systolic murmur.     Abdomen: Soft, non-tender and non-distended.    Extremities: There is no pitting edema in the distal lower extremities bilaterally.    Skin: Warm and dry without trophic changes noted.    Musculoskeletal: exam reveals no obvious joint deformities.    Neurologically:  Mental status: The patient is awake, alert and pays good attention.  She is able to answer questions quite appropriately, her son provides details of her history.   There is no evidence of aphasia, agnosia, apraxia or anomia. Speech is clear with normal prosody and enunciation. Thought process is linear. Mood is normal and affect is normal.        09/01/2022    1:58 PM  MMSE - Mini Mental State Exam  Orientation to time 4  Orientation to Place 4  Registration 3  Attention/ Calculation 1  Recall 1  Language- name 2 objects 2  Language- repeat 1  Language- follow 3 step command 3  Language- read & follow direction 1  Write a sentence 1  Copy design 0  Total score 21      On 09/01/2022: CDT: 1/4, AFT: 4/min.   Cranial nerves II - XII are as described above under HEENT exam.  Motor exam: Normal bulk, strength and tone is noted. There is no obvious action or resting tremor.  Fine motor skills and coordination: grossly intact.  Cerebellar testing: No dysmetria or intention tremor. There is no truncal or gait ataxia.  Sensory exam: intact to light touch in the upper and lower extremities.  Gait, station  and balance: She stands without difficulty, pushes herself up and requires no assistance, posture is age-appropriate.  She walks slightly cautiously and slowly, no walking aid, no shuffling, has preserved arm swing.    Assessment and Plan:    In summary, KERENA PIZZOLA is a very pleasant 82 year old female with an underlying complex medical history of chronic atrial fibrillation on coumadin, congestive heart failure, pulmonary hypertension, on home oxygen therapy, history of thrombocytopenia, arthritis, allergies, and history of sepsis with hospitalization in November 2023,  who presents for follow-up consultation on her memory loss. Her MMSE on 09/01/2022 was 21.  She was on donepezil per PCP and continues to take it.  Her heart rate is low, I suggested that we continue with donepezil 5 mg daily, she has a valid prescription through her PCP currently.  Her family is looking into getting her enrolled at a senior enrichment center in [facility], she would be able to go from 9-5 even daily, once she is enrolled today.  She has not had any significant changes in her medical history.  She has had some recent mood irritability around Mother's Day per son, but overall is doing quite well.  We talked about the importance of maintaining a healthy lifestyle, good nutrition and good hydration and maintaining physical activity and cognitive stimulation. She had a brain MRI without contrast on 09/23/2022 which showed mild chronic small vessel ischemic disease and mild generalized atrophy, no acute findings. Her mom had Alzheimer's dementia, patient has significant vascular risk factors.  She may have a mixed type of memory loss.  She is advised to follow-up routinely in this clinic to see one of our nurse practitioners in 6 months, sooner if needed.  I would be cautious with increasing the donepezil which is currently 5 mg daily due to risk of bradycardia and her heart rate today was 46.   I answered all the questions today and the patient and her son were in agreement.

## 2022-12-17 DIAGNOSIS — I10 Essential (primary) hypertension: Secondary | ICD-10-CM | POA: Diagnosis not present

## 2022-12-22 NOTE — Progress Notes (Signed)
  Care Coordination  Outreach Note  12/22/2022 Name: LILLIAM CHAMBLEE MRN: [id number] DOB: [birth year]   Care Coordination Outreach Attempts: A second unsuccessful outreach was attempted today to offer the patient with information about available care coordination services.  Follow Up Plan:  Additional outreach attempts will be made to offer the patient care coordination information and services.   Encounter Outcome:  No Answer  Christie Nottingham  Care Coordination Care Guide  Direct Dial: [phone]

## 2022-12-24 DIAGNOSIS — Z831 Family history of other infectious and parasitic diseases: Secondary | ICD-10-CM | POA: Diagnosis not present

## 2022-12-24 DIAGNOSIS — I272 Pulmonary hypertension, unspecified: Secondary | ICD-10-CM | POA: Diagnosis not present

## 2022-12-24 DIAGNOSIS — N1832 Chronic kidney disease, stage 3b: Secondary | ICD-10-CM | POA: Diagnosis not present

## 2022-12-24 DIAGNOSIS — I4891 Unspecified atrial fibrillation: Secondary | ICD-10-CM | POA: Diagnosis not present

## 2022-12-24 DIAGNOSIS — I509 Heart failure, unspecified: Secondary | ICD-10-CM | POA: Diagnosis not present

## 2022-12-24 DIAGNOSIS — D696 Thrombocytopenia, unspecified: Secondary | ICD-10-CM | POA: Diagnosis not present

## 2022-12-24 DIAGNOSIS — E7800 Pure hypercholesterolemia, unspecified: Secondary | ICD-10-CM | POA: Diagnosis not present

## 2022-12-24 DIAGNOSIS — I50812 Chronic right heart failure: Secondary | ICD-10-CM | POA: Diagnosis not present

## 2022-12-24 DIAGNOSIS — N281 Cyst of kidney, acquired: Secondary | ICD-10-CM | POA: Diagnosis not present

## 2022-12-24 DIAGNOSIS — I10 Essential (primary) hypertension: Secondary | ICD-10-CM | POA: Diagnosis not present

## 2022-12-24 DIAGNOSIS — Z0000 Encounter for general adult medical examination without abnormal findings: Secondary | ICD-10-CM | POA: Diagnosis not present

## 2022-12-25 DIAGNOSIS — I509 Heart failure, unspecified: Secondary | ICD-10-CM | POA: Diagnosis not present

## 2022-12-29 NOTE — Progress Notes (Signed)
  Care Coordination  Outreach Note  12/29/2022 Name: Vicki [name] MRN: [id number] DOB: 27-Jan-1941   Care Coordination Outreach Attempts: A third unsuccessful outreach was attempted today to offer the patient with information about available care coordination services.  Follow Up Plan:  No further outreach attempts will be made at this time. We have been unable to contact the patient to offer or enroll patient in care coordination services  Encounter Outcome:  No Answer  Christie Nottingham  Care Coordination Care Guide  Direct Dial: [phone]

## 2023-01-08 ENCOUNTER — Other Ambulatory Visit (HOSPITAL_COMMUNITY): Payer: Self-pay | Admitting: Cardiology

## 2023-01-08 DIAGNOSIS — I272 Pulmonary hypertension, unspecified: Secondary | ICD-10-CM

## 2023-01-18 ENCOUNTER — Encounter (HOSPITAL_COMMUNITY): Payer: Medicare HMO | Admitting: Cardiology

## 2023-01-18 ENCOUNTER — Ambulatory Visit (HOSPITAL_COMMUNITY): Payer: Medicare HMO

## 2023-01-22 ENCOUNTER — Encounter (HOSPITAL_COMMUNITY): Payer: Self-pay | Admitting: Cardiology

## 2023-01-22 ENCOUNTER — Ambulatory Visit (HOSPITAL_COMMUNITY)
Admission: RE | Admit: 2023-01-22 | Discharge: 2023-01-22 | Disposition: A | Discharge status: Home / Self Care | Payer: Medicare HMO | Source: Ambulatory Visit | Attending: Cardiology | Admitting: Cardiology

## 2023-01-22 VITALS — BP 120/60 | HR 44 | Wt 127.4 lb

## 2023-01-22 DIAGNOSIS — I482 Chronic atrial fibrillation, unspecified: Secondary | ICD-10-CM | POA: Insufficient documentation

## 2023-01-22 DIAGNOSIS — I272 Pulmonary hypertension, unspecified: Secondary | ICD-10-CM

## 2023-01-22 DIAGNOSIS — Z7901 Long term (current) use of anticoagulants: Secondary | ICD-10-CM | POA: Insufficient documentation

## 2023-01-22 DIAGNOSIS — I110 Hypertensive heart disease with heart failure: Secondary | ICD-10-CM | POA: Diagnosis not present

## 2023-01-22 DIAGNOSIS — Z79899 Other long term (current) drug therapy: Secondary | ICD-10-CM | POA: Insufficient documentation

## 2023-01-22 DIAGNOSIS — I2721 Secondary pulmonary arterial hypertension: Secondary | ICD-10-CM | POA: Diagnosis not present

## 2023-01-22 DIAGNOSIS — I4821 Permanent atrial fibrillation: Secondary | ICD-10-CM

## 2023-01-22 DIAGNOSIS — E785 Hyperlipidemia, unspecified: Secondary | ICD-10-CM | POA: Diagnosis not present

## 2023-01-22 DIAGNOSIS — I5032 Chronic diastolic (congestive) heart failure: Secondary | ICD-10-CM | POA: Diagnosis not present

## 2023-01-22 LAB — BASIC METABOLIC PANEL
Anion gap: 7 (ref 5–15)
BUN: 23 mg/dL (ref 8–23)
CO2: 29 mmol/L (ref 22–32)
Calcium: 9.7 mg/dL (ref 8.9–10.3)
Chloride: 103 mmol/L (ref 98–111)
Creatinine, Ser: 1.53 mg/dL — ABNORMAL HIGH (ref 0.44–1.00)
GFR, Estimated: 34 mL/min — ABNORMAL LOW (ref >60)
Glucose, Bld: 91 mg/dL (ref 70–99)
Potassium: 4.5 mmol/L (ref 3.5–5.1)
Sodium: 139 mmol/L (ref 135–145)

## 2023-01-22 LAB — CBC
HCT: 39.8 % (ref 36.0–46.0)
Hemoglobin: 12.3 g/dL (ref 12.0–15.0)
MCH: 31.4 pg (ref 26.0–34.0)
MCHC: 30.9 g/dL (ref 30.0–36.0)
MCV: 101.5 fL — ABNORMAL HIGH (ref 80.0–100.0)
Platelets: 102 10*3/uL — ABNORMAL LOW (ref 150–400)
RBC: 3.92 MIL/uL (ref 3.87–5.11)
RDW: 13.6 % (ref 11.5–15.5)
WBC: 4 10*3/uL (ref 4.0–10.5)
nRBC: 0 % (ref 0.0–0.2)

## 2023-01-22 LAB — BRAIN NATRIURETIC PEPTIDE: B Natriuretic Peptide: 943.2 pg/mL — ABNORMAL HIGH (ref 0.0–100.0)

## 2023-01-22 LAB — LIPID PANEL
Cholesterol: 141 mg/dL (ref 0–200)
HDL: 65 mg/dL (ref >40)
LDL Cholesterol: 63 mg/dL (ref 0–99)
Total CHOL/HDL Ratio: 2.2 RATIO
Triglycerides: 64 mg/dL (ref <150)
VLDL: 13 mg/dL (ref 0–40)

## 2023-01-22 NOTE — Progress Notes (Signed)
Date:  01/22/2023   ID:  Earleen Reaper, DOB 10/03/1940, MRN [id number]   Provider location: [facility] Type of Visit: Established patient   PCP:  Merri Brunette, MD  HF Cardiologist:  Dr. Shirlee Latch   HPI: JEFFIFER ONEIL is a 82 y.o. female who has a history of chronic diastolic CHF, pulmonary hypertension and chronic atrial fibrillation.  Patient was followed by Dr. Aleen Campi in the past for chronic atrial fibrillation.  She has been on coumadin.  She reports progressive exertional dyspnea since 2011.  This gradually worsened and became quite significant over the last few months.  She used to have significant HTN, but more recently her BP has been on the lower side.  Echo was done in 2/14, showing severe concentric LVH with EF 55-60%, moderately dilated RV, moderate to severe TR, and PA systolic pressure 86 mmHg.  I did a right heart cath in 5/14.  This showed PA pressure 104/36 with PCWP 20, suggesting pulmonary arterial HTN well out of proportion to the mildly elevate wedge pressure.  She was already on amlodipine so I did not do vasodilator testing.  V/Q scan was done, showing no evidence for chronic PEs.  PFTs showed a restrictive defect. Cardiac MRI did not show definite evidence for amyloid.  I started her on macitentan 10 mg daily.  Initially, she felt better on macitentan.  However, she was admitted in 5/14 from her sleep study due to orthopnea and dyspnea.  She was diuresed for several days and diuretic was switched over to torsemide.  I next started her on tadalafil 20 mg daily and titrated up to 40 mg daily.  She thinks that this helped.  She saw pulmonary after chest CT (showed mosaic attenuation in lungs).  This was thought to be due to air-trapping rather than ILD.  She was started on Spiriva. She wears oxygen at home.    She had an echocardiogram in 2/15 that showed severe LVH, EF 75%, small pericardial effusion, mildly dilated RV with mildly decreased systolic  function, PA systolic pressure 84 mmHg.  She is not totally sure if she was taking macitentan and tadalafil at that time.  She has had a hard month.  She ran out of her macitentan and tadalafil and her insurance company refused to refill them.  After this, she took a decided turn for the worse.  [place] her [place] in the fall.  She became much more short of breath, just with walking around the house. She developed lightheaded spells, especially with micturation.  Given lightheadedness and presyncope, she was admitted last week.  She was restarted on her medications and she finally got back on her meds at home.  In the hospital, she was noted to be bradycardic with HR to 30s so metoprolol was stopped.  Of note, her abdominal fat pad biopsy did not suggest amyloidosis. Repeat RHC in 3/15 still with moderate to severe PAH and low CI.  Holter off beta blocker in 3/15 showed average HR 73.    Echo in 9/17 showed EF 65-70% with moderate to severe LVH, moderate MR, normal RV size with mildly decreased systolic function, PASP 56 mmHg, small pericardial effusion.    Echo in 10/18 showed EF 60-65% with moderate LVH, moderate MR, severe biatrial enlargement, PASP 62 mmHg, RV normal.  PYP scan in 10/18 was not suggestive of TTR amyloidosis.   Echo in  10/19 showed EF 65-70%, moderate MR, normal RV size and systolic function, PASP 43 mmHg.   Echo in 12/20 showed EF 65-70%, moderate LVH, severe biatrial enlargement, mild-moderate MR, small pericardial effusion, unable to estimate PA systolic pressure.   PYP scan in 12/20 was visually grade 2 with H/CL 1.96 but activity appeared localized to the atria. Invitae gene testing for TTR amyloidosis in 3/21 was negative.   Echo in 2/22 showed EF 60-65%, moderate LVH, normal RV, PASP 37 mmHg, moderate central MR.  PYP scan repeated 9/22 was grade 2 with H/CL 1.28 => isolated atrial uptake again.  Echo in 5/23  showed EF 60-65%, severe concentric LVH, normal RV, PASP 49 mmHg, moderate MR, small pericardial effusion.   Admitted 05/14/22 with sepsis in the setting of flu and pneumonia. Placed on Tamiflu and antibiotics. AHF consulted to help with HF/PH meds due to AKI. Discharged home, weight 137 lbs.  Today she returns for HF follow up with her son. She has had memory trouble and is now on Aricept, had MRI head with neurology that showed no CVA, chronic ischemic changes present.  Weight down 4 lbs. No lightheadedness or falls.  She has trouble using the Tyvaso inhaler, interested in switching to DPI.  No dyspnea walking on flat ground.  No orthopnea/PND.  No chest pain.   ECG (personally reviewed): Atrial fibrillation, LVH with repolarization abnormalities.   6 minute walk (5/14): 122 m.   6 minute walk (7/14): 152 m 6 minute walk (10/14): 183 m 6 minute walk (4/15): 317 m 6 minute walk (12/15): 259 m 6 minute walk (4/16): 293 m 6 minute walk (8/16): 341 m 6 minute walk (12/16): 274 m 6 minute walk (6/17): 314 m 6 minute walk (9/17): 307 m 6 minute walk (4/18): 366 m 6 minute walk (10/18): 320 m 6 minute walk (3/19): 381 m 6 minute walk (9/19): 366 m 6 minute walk (9/20): 305 m 6 minute walk (12/20): 243 m 6 minute walk (6/21): 304 m 6 minute walk (11/21): 323 m 6 minute walk (2/22): 305 m 6 minute walk (5/22): 366 m 6 minute walk (1/23): 366 m 6 minute walk (5/23): 314 m   Labs (12/13): HCT 45.1, plts 153, K 3.5, creatinine 1.0 Labs (1/14): BNP 849 Labs (4/14): K 3.1, creatinine 1.0, ANA and anti-SCL-70 antibody negative.  Rheumatoid factor negative.  Serum immunofixation did not show monoclonal light chains.  Labs (5/14): K 3.3, creatinine 0.79, proBNP 5147 Labs (6/14): K 4, creatinine 0.9, BNP 1001 Labs (10/14): LDL 53, LDL-P 975, K 3.7, creatinine 1.2 Labs (11/14): K 3.7, creatinine 0.9, BNP 832 Labs (2/15): K 3.5, creatinine 1.10, HCT 42.6, UPEP negative, SPEP negative.  Labs  (3/15): K 3.8, creatinine 0.88 Labs (4/15): K 3.7, creatinine 0.99, proBNP 1653 Labs (7/15): K 4, creatinine 0.93 Labs (12/15): LDL 77, HDL 58, K 3.9, creatinine 6.06, LFTs normal Labs (4/16): K 3.8, creatinine 0.93, BNP 475, plts 105, HCT 42.3 Labs (11/16): K 3.6, creatinine 1.1, HCT 42.2 Labs (12/16): LDL 54, HDL 50, BNP 628 Labs (6/17): K 3.7, creatinine 0.97, HCT 41.3, BNP 489 Labs (9/17): K 3.8, creatinine 0.97, LDL 65, HDL 54 Labs (9/18): K 4.1, creatinine 1.05, hgb 14.6, BNP 646 Labs (10/18): BNP 588, TSH normal, LDL 73, HDL 48, K 3.5, creatinine 3.01, hgb 12.5, plts 91, LFTs normal Labs (3/19): K 4.1, creatinine 1.07, hgb 13.2 Labs (9/19): K 3.8, creatinine 1.1 Labs (7/20): K 3.9, creatinine 1.3, LDL 63 Labs (9/20): K 4.2, creatinine  1.05, hgb 12.6, BNP 632  Labs (12/20): Myeloma panel negative, urine immunofixation negative.  Labs (1/21): K 4.3, creatinine 1.12, LDL 63 Labs (3/21): K 3.8, creatinine 1.13 Labs (6/21): K 3.9, creatinine 1.16 Labs (11/21): LDL 63, K 4.1, creatinine 8.29 Labs (2/22): K 4.3, creatinine 1.14, hgb 11.5, BNP 915 Labs (5/22): LDL 65 Labs (6/22): K 4, creatinine 1.16 Labs (8/22): K 3.6, creatinine 1.05, BNP 720 Labs (2/23): K 3.8, creatinine 1.26, LDL 62, BNP 623 Labs (5/23): K 3.5, creatinine 1.18, BNP 477 Labs (11/23): K 4.3, creatinine 1.22 Labs (07/26/22): K 3.6 Creatinine 1.46, hgb 11.6   PMH: 1. Chronic diastolic CHF: Echo (2/14) with EF 55-60%, severe LVH (no SAM, no asymmetric hypertrophy, no LVOT gradient), moderate-severe LAE, moderately dilated RV with mildly decreased systolic function, moderate to severe RAE, PA systolic pressure 86 mmHg, moderate-severe TR, moderate MR, trivial pericardial effusion.  Cardiac MRI (5/14): EF 65%, severe LVH, no definite evidence for amyloidosis (no delayed enhancement, myocardium not difficult to null).  Echo (2/15) with EF 75%, severe LVH, grade II diastolic dysfunction, moderate MR, RV mildly dilated with  mildly decreased systolic function, moderate TR, PA systolic pressure 84 mmHg, small pericardial effusion. Abdominal fat pad biopsy (2/15) showed no evidence for amyloidosis.  SPEP/UPEP negative 2/15.  - Echo (4/16) with EF 60-65%, severe LVH, RV mildly dilated with mildly decreased systolic function, PA systolic pressure 40 mmHg. - Echo (9/17) with EF 65-70%, severe LVH, moderate MR, normal RV size with mildly decreased systolic function, PASP 56 mmHg, small pericardial effusion.  - Echo (10/18): EF 60-65% with moderate LVH, moderate MR, severe biatrial enlargement, PASP 62 mmHg, RV normal. - PYP scan (10/18) was not suggestive of TTR amyloidosis.  - Echo (10/19): EF 65-70%, moderate MR, normal RV size and systolic function, PASP 43 mmHg. - Echo (12/20): EF 65-70%, moderate LVH, severe biatrial enlargement, mild-moderate MR, small pericardial effusion, unable to estimate PA systolic pressure.  - PYP scan (12/20): grade 2 with H/CL 1.96 but activity localized to the atria.  - Invitae gene testing for TTR amyloidosis in 3/21 was negative.  - Echo (2/22): EF 60-65%, moderate LVH, normal RV, PASP 37 mmHg, moderate central MR. - PYP scan (9/22): grade 2, H/CL 1.28 => isolated atrial uptake.  - Echo (5/23): EF 60-65%, severe concentric LVH, normal RV, PASP 49 mmHg, moderate MR, small pericardial effusion.  2. Chronic atrial fibrillation since around 2004.  Developed bradycardia and metoprolol stopped 2/15. Holter (3/15) with average HR 73, atrial fibrillation, 3.8 sec pause x 1 while asleep, PVCs.  3. HTN: For decades.  4. LHC (2/08) with no significant disease.  5. Chronic thrombocytopenia: ITP 6. Pulmonary arterial HTN: RHC (5/14) with mean RA 13, PA 104/36 (mean 63), mean PCWP 20 on right and 23 on left, CI 2.3 (Fick) and 1.6 (thermo), PVR 10.4 WU (Fick) and 15 WU (thermo).  Vasodilator testing not done as patient was already on amlodipine.  V/Q scan (5/14) with no evidence for chronic PE.  ANA, RF,  and anti-SCL70 antibody negative.  PFTs (5/14) with FEV1 60%, FVC 54%, ratio 112%, TLC 61%, DLCO 43% => restrictive defect.  Sleep study (7/14) with no OSA.  CT chest with areas of mosaic attenuation in lungs (saw pulmonary, thought air trapping and not ILD). RHC (3/15) with RA mean 5, PA 66/31 mean 43, PCWP mean 11, Cardiac Index (Fick) 1.97, PVR 9.2 WU.  Patient was started on Tyvaso in 3/15.  Echo (4/16) with mildly dilated and mildly  dysfunctional RV, PA systolic pressure 40 mmHg.  7. Chest pain: Cardiolite 11/21 with no ischemia.  8. Early dementia: MRI brain 3/24 with no CVA, chronic ischemic changes.   Current Outpatient Medications  Medication Sig Dispense Refill   acetaminophen (TYLENOL) 500 MG tablet Take 500 mg by mouth every 6 (six) hours as needed for mild pain or headache.      busPIRone (BUSPAR) 5 MG tablet Take 5 mg by mouth every morning.     Cholecalciferol (VITAMIN D3) 2000 units TABS Take 1 tablet by mouth daily.      cyanocobalamin (VITAMIN B12) 1000 MCG tablet Take 1 tablet (1,000 mcg total) by mouth daily. 30 tablet 0   donepezil (ARICEPT) 5 MG tablet Take 5 mg by mouth daily.     KLOR-CON M20 20 MEQ tablet TAKE TWO TABLETS BY MOUTH IN THE MORNING AND ONE IN THE EVENING 90 tablet 0   levocetirizine (XYZAL) 5 MG tablet Take 2.5 mg by mouth daily in the afternoon.     macitentan (OPSUMIT) 10 MG tablet TAKE 1 TABLET (10MG ) BY MOUTH DAILY WITH BREAKFAST 30 tablet 6   polyethylene glycol (MIRALAX) 17 g packet Take 17 g by mouth daily. 30 each 0   psyllium (HYDROCIL/METAMUCIL) 95 % PACK Take 1 packet by mouth as needed for mild constipation.     rosuvastatin (CRESTOR) 20 MG tablet Take 20 mg by mouth at bedtime.      SENNA PO Take by mouth as needed.     spironolactone (ALDACTONE) 25 MG tablet Take 0.5 tablets (12.5 mg total) by mouth daily. 30 tablet 6   tadalafil, PAH, (ADCIRCA) 20 MG tablet TAKE 2 TABLETS DAILY (Generic for Adcirca) 60 tablet 11   Tafamidis (VYNDAMAX) 61 MG  CAPS Take 1 capsule by mouth daily. 90 capsule 3   torsemide (DEMADEX) 20 MG tablet Take 1.5 tablets (30 mg total) by mouth 2 (two) times daily. 90 tablet 6   Treprostinil (TYVASO) 0.6 MG/ML SOLN Inhale 54 mcg into the lungs 4 (four) times daily. 30 mL 5   warfarin (COUMADIN) 5 MG tablet TAKE 1/2 TO 1 TABLET BY MOUTH daily AS DIRECTED by THE coumadin clinic 100 tablet 1   No current facility-administered medications for this encounter.    Allergies:   Patient has no known allergies.   Social History:  The patient  reports that she has never smoked. She has never used smokeless tobacco. She reports that she does not currently use alcohol. She reports that she does not use drugs.   Family History:  The patient's family history includes Alzheimer's disease in her mother; Heart Problems in her brother and maternal aunt; Heart attack in her father; Hypertension in her brother; Pulmonary Hypertension in her cousin; Thyroid disease in her daughter, daughter, and maternal aunt.   ROS:  Please see the history of present illness.   All other systems are personally reviewed and negative.   Recent Labs: 05/16/2022: TSH 0.814 05/19/2022: Magnesium 1.7 07/26/2022: ALT 12 01/22/2023: B Natriuretic Peptide 943.2; BUN 23; Creatinine, Ser 1.53; Hemoglobin 12.3; Platelets 102; Potassium 4.5; Sodium 139  Personally reviewed   BP 120/60   Pulse (!) 44   Wt 57.8 kg (127 lb 6.4 oz)   SpO2 95%   BMI 24.88 kg/m   Wt Readings from Last 3 Encounters:  01/22/23 57.8 kg (127 lb 6.4 oz)  12/15/22 57.6 kg (127 lb)  09/10/22 59.8 kg (131 lb 12.8 oz)   Physical Exam General: NAD Neck: No  JVD, no thyromegaly or thyroid nodule.  Lungs: Clear to auscultation bilaterally with normal respiratory effort. CV: Nondisplaced PMI.  Heart regular S1/S2, no S3/S4, no murmur.  No peripheral edema.  No carotid bruit.  Normal pedal pulses.  Abdomen: Soft, nontender, no hepatosplenomegaly, no distention.  Skin: Intact without  lesions or rashes.  Neurologic: Alert and oriented x 3.  Psych: Normal affect. Extremities: No clubbing or cyanosis.  HEENT: Normal.   Assessment & Plan 1. Pulmonary HTN: Patient has severe pulmonary arterial HTN.  RHC in 3/15 showed CI 1.97, PVR 9. 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 Collagen vascular disease workup was negative (negative RF, ANA and negative anti-SCL-70).  V/Q scan was not suggestive of chronic PEs.  PFTs were suggestive of restrictive lung disease but CT chest and evaluation by pulmonary did not suggest interstitial lung disease.  Sleep study did not show OSA.  Echo in 3/22 showed normal RV size and systolic function, PASP estimation 37 mmHg.  Echo 5/23 showed EF 60-65%, severe concentric LVH, normal RV, PASP 49 mmHg, moderate MR, small pericardial effusion.  - Continue macitentan and Adcirca. - She has trouble with the Tyvaso inhaler, will see if we can transition her to DPI.   - Defer 6 minute walk today as she arrived late, will do after next appt.  - I will arrange for repeat echo.  2. Chronic diastolic CHF: EF preserved on echo with moderate concentric LVH and a small pericardial effusion.  It is possible that the LVH is due to years of HTN.  The cardiac MRI was not definitively suggestive of cardiac amyloidosis.  I was still concerned for amyloidosis given the appearance of the LV myocardium on echoes.  Abdominal fat pad biopsy in 2015 showed no evidence for amyloidosis. She has some symptoms suggestive of mild peripheral neuropathy.  PYP scan in 10/18 was not suggestive of TTR amyloidosis. I assessed her again for cardiac amyloidosis in 2020: myeloma panel and urine immunofixation negative, PYP scan in 12/20 was grade 2 with H/CL 1.97 but activity was localized to the atria.  Invitae gene testing for TTR amyloidosis was negative.  PYP scan repeated 9/22 was grade 2 with H/CL 1.28 => isolated atrial uptake again.  Echo in 5/23 was consistent with cardiac  amyloidosis, EF 60-65%, severe concentric LVH, normal RV, PASP 49 mmHg, moderate MR, small pericardial effusion. She does not look volume overloaded on exam, NYHA class II-III chronically.  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 This may be an earlier manifestation before LV involvement. This can be associated with atrial fibrillation. She will continue tafamidis.   - Continue torsemide 30 mg bid. BMET/BNP.  - Continue spironolactone 12.5 mg daily. 3. HTN: Stable off amlodipine.  4. Chronic atrial fibrillation: Continue coumadin.  She is off metoprolol due to bradycardia. Holter off metoprolol did not show any high rate episodes.  5. Hyperlipidemia: On Crestor. Check lipids today.   Followup 3 months with APP  Marca Ancona  01/22/2023  [facility] [address] [facility] [place] [address] [phone] (office) [phone] (fax)

## 2023-01-22 NOTE — Patient Instructions (Signed)
  Labs done today, your results will be available in MyChart, we will contact you for abnormal readings.  Your physician has requested that you have an echocardiogram. Echocardiography is a painless test that uses sound waves to create images of your heart. It provides your doctor with information about the size and shape of your heart and how well your heart's chambers and valves are working. This procedure takes approximately one hour. There are no restrictions for this procedure. Please do NOT wear cologne, perfume, aftershave, or lotions (deodorant is allowed). Please arrive 15 minutes prior to your appointment time.   Your physician recommends that you schedule a follow-up appointment in: 3 months with an echocardiogram (OCTOBER) **PLEASE CALL THE OFFICE IN MID AUGUST TO ARRANGE YOUR FOLLOW UP APPOINTMENT. **  If you have any questions or concerns before your next appointment please send [place] a message through [place] or call our office at [phone].    TO LEAVE A MESSAGE FOR THE NURSE SELECT OPTION 2, PLEASE LEAVE A MESSAGE INCLUDING: YOUR NAME DATE OF BIRTH CALL BACK NUMBER REASON FOR CALL**this is important as we prioritize the call backs  YOU WILL RECEIVE A CALL BACK THE SAME DAY AS LONG AS YOU CALL BEFORE 4:00 PM  At the [facility], you and your health needs are our priority. As part of our continuing mission to provide you with exceptional heart care, we have created designated Provider Care Teams. These Care Teams include your primary Cardiologist (physician) and Advanced Practice Providers (APPs- Physician Assistants and Nurse Practitioners) who all work together to provide you with the care you need, when you need it.   You may see any of the following providers on your designated Care Team at your next follow up: Dr Arvilla Meres Dr Marca Ancona Dr. Marcos Eke, NP Robbie Lis, [place] [facility] [place], [place] Brynda Peon,  NP Karle Plumber, PharmD   Please be sure to bring in all your medications bottles to every appointment.    Thank you for choosing [facility]

## 2023-01-24 DIAGNOSIS — I509 Heart failure, unspecified: Secondary | ICD-10-CM | POA: Diagnosis not present

## 2023-01-27 ENCOUNTER — Other Ambulatory Visit: Payer: Self-pay

## 2023-01-27 ENCOUNTER — Telehealth (HOSPITAL_COMMUNITY): Payer: Self-pay | Admitting: Pharmacist

## 2023-01-27 ENCOUNTER — Other Ambulatory Visit (HOSPITAL_COMMUNITY): Payer: Self-pay

## 2023-01-27 ENCOUNTER — Telehealth (HOSPITAL_COMMUNITY): Payer: Self-pay | Admitting: Pharmacy Technician

## 2023-01-27 ENCOUNTER — Telehealth (HOSPITAL_COMMUNITY): Payer: Self-pay

## 2023-01-27 NOTE — Telephone Encounter (Signed)
Advanced Heart Failure Patient Advocate Encounter  Sent Tyvaso referral via docusign for signature.  Will follow up.

## 2023-01-27 NOTE — Telephone Encounter (Signed)
Patients daughter called- returning a call to Lauren in the pharmacy. Advised I would forward message over.

## 2023-01-27 NOTE — Telephone Encounter (Signed)
Patient Advocate Encounter   Received notification from [facility] that prior authorization for Tyvaso DPI is required.    PA submitted on CoverMyMeds Key [id number] Status is pending   Will continue to follow.  Karle Plumber, PharmD, BCPS, BCCP, CPP Heart Failure Clinic Pharmacist [phone]

## 2023-01-28 ENCOUNTER — Ambulatory Visit (HOSPITAL_COMMUNITY)
Admission: RE | Admit: 2023-01-28 | Discharge: 2023-01-28 | Disposition: A | Discharge status: Home / Self Care | Payer: Medicare HMO | Source: Ambulatory Visit | Attending: Cardiology | Admitting: Cardiology

## 2023-01-28 DIAGNOSIS — I517 Cardiomegaly: Secondary | ICD-10-CM | POA: Insufficient documentation

## 2023-01-28 DIAGNOSIS — I43 Cardiomyopathy in diseases classified elsewhere: Secondary | ICD-10-CM | POA: Insufficient documentation

## 2023-01-28 DIAGNOSIS — I3139 Other pericardial effusion (noninflammatory): Secondary | ICD-10-CM | POA: Insufficient documentation

## 2023-01-28 DIAGNOSIS — E785 Hyperlipidemia, unspecified: Secondary | ICD-10-CM | POA: Insufficient documentation

## 2023-01-28 DIAGNOSIS — I4891 Unspecified atrial fibrillation: Secondary | ICD-10-CM | POA: Diagnosis not present

## 2023-01-28 DIAGNOSIS — E854 Organ-limited amyloidosis: Secondary | ICD-10-CM | POA: Diagnosis not present

## 2023-01-28 DIAGNOSIS — I340 Nonrheumatic mitral (valve) insufficiency: Secondary | ICD-10-CM | POA: Insufficient documentation

## 2023-01-28 DIAGNOSIS — I5032 Chronic diastolic (congestive) heart failure: Secondary | ICD-10-CM | POA: Diagnosis not present

## 2023-01-28 DIAGNOSIS — I272 Pulmonary hypertension, unspecified: Secondary | ICD-10-CM | POA: Diagnosis not present

## 2023-01-28 LAB — ECHOCARDIOGRAM COMPLETE
MV M vel: 5.85 m/s
MV Peak grad: 136.9 mmHg
Radius: 0.5 cm
S' Lateral: 2.5 cm

## 2023-01-28 NOTE — Progress Notes (Signed)
  Echocardiogram 2D Echocardiogram has been performed.  Delcie Roch 01/28/2023, 3:06 PM

## 2023-01-29 ENCOUNTER — Other Ambulatory Visit: Payer: Self-pay

## 2023-02-01 ENCOUNTER — Telehealth (HOSPITAL_COMMUNITY): Payer: Self-pay

## 2023-02-01 DIAGNOSIS — E854 Organ-limited amyloidosis: Secondary | ICD-10-CM

## 2023-02-01 DIAGNOSIS — I5032 Chronic diastolic (congestive) heart failure: Secondary | ICD-10-CM

## 2023-02-01 NOTE — Telephone Encounter (Signed)
Patients daughter Jacki Cones advised and verbalized understanding. Echo scheduled, order entered  Orders Placed This Encounter  Procedures   ECHOCARDIOGRAM LIMITED    Standing Status:   Future    Standing Expiration Date:   02/01/2024    Order Specific Question:   Where should this test be performed    Answer:   [facility]    Order Specific Question:   Perflutren DEFINITY (image enhancing agent) should be administered unless hypersensitivity or allergy exist    Answer:   Administer Perflutren    Order Specific Question:   Does this study need to be read by the Structural team/Level 3 readers?    Answer:   No    Order Specific Question:   Reason for exam-Echo    Answer:   Congestive Heart Failure  I50.9    Order Specific Question:   Release to patient    Answer:   Immediate

## 2023-02-01 NOTE — Telephone Encounter (Signed)
-----   Message from Marca Ancona sent at 01/29/2023  7:37 AM EDT ----- Normal LV EF with LVH consistent with known diagnosis of amyloidosis.  Pericardial effusion was small 1 year, ago, now rated moderate.  No tamponade.  If symptoms remain stable, repeat limited echo for pericardial effusion in 2 months to make sure no progressive growth.

## 2023-02-02 ENCOUNTER — Other Ambulatory Visit: Payer: Self-pay

## 2023-02-02 NOTE — Telephone Encounter (Signed)
Advanced Heart Failure Patient Advocate Encounter  Prior Authorization for Tyvaso DPI has been approved.    Effective dates: 06/29/22 through 06/29/23  Karle Plumber, PharmD, BCPS, BCCP, CPP Heart [facility] Clinic Pharmacist [phone]

## 2023-02-02 NOTE — Telephone Encounter (Signed)
Advanced Heart Failure Patient Advocate Encounter  Called and spoke with Jacki Cones. She stated she has not seen the docusign link, sent again.   States she will call back if she does not receive the information.

## 2023-02-02 NOTE — Telephone Encounter (Signed)
Sent in enrollment application to [facility] for Tyvaso DPI.    Application pending, will continue to follow.   Karle Plumber, PharmD, BCPS, BCCP, CPP Heart [facility] Clinic Pharmacist [phone]

## 2023-02-17 ENCOUNTER — Other Ambulatory Visit: Payer: Self-pay

## 2023-02-19 ENCOUNTER — Ambulatory Visit: Payer: Medicare HMO

## 2023-02-19 ENCOUNTER — Telehealth (HOSPITAL_COMMUNITY): Payer: Self-pay | Admitting: Cardiology

## 2023-02-19 DIAGNOSIS — I4891 Unspecified atrial fibrillation: Secondary | ICD-10-CM | POA: Diagnosis not present

## 2023-02-19 DIAGNOSIS — Z5181 Encounter for therapeutic drug level monitoring: Secondary | ICD-10-CM | POA: Diagnosis not present

## 2023-02-19 LAB — POCT INR: INR: 1.3 — AB (ref 2.0–3.0)

## 2023-02-19 NOTE — Telephone Encounter (Signed)
Is she using the DPI?  Lauren, could you talk with them.

## 2023-02-19 NOTE — Patient Instructions (Signed)
TAKE 1 TABLET TODAY and 1.5 TABLETS SATURDAY then continue taking warfarin 1 tablet daily except for 1/2 a tablet on Mondays and Fridays.  Stay consistent with greens and boost each week:  Boost 1 time per week  & Greens 1-2 per week  Recheck INR in 2 weeks.  Coumadin Clinic [phone]

## 2023-02-19 NOTE — Telephone Encounter (Signed)
DAUGHTER called after initialization visit with specialty med visit. (TYVASSO)  Concerns with pt unable to manage at this time. Ease of use for device  Request pill form or something easier to use without a device.  Please advise

## 2023-02-22 ENCOUNTER — Other Ambulatory Visit (HOSPITAL_COMMUNITY): Payer: Self-pay

## 2023-02-22 NOTE — Telephone Encounter (Signed)
Spoke with Vicki [name], her daughter. Vicki [name] has been switched to the DPI form, but is still having issues with the device. She says that they are having issues with her dementia, and anything with a device is very confusing. At this point, she is not using the Tyvaso at all because she can't get it to work. Vicki [name] states that they would be interested in an oral prostacyclin to replace the Tyvaso. Vicki [name] is currently in a day program, so medications that are taken BID (rather than throughout the day) work best. She has never tried [place] before.   Would you like me to start the process for Uptravi (or another oral prostacyclin) or hold off for now?

## 2023-02-23 ENCOUNTER — Telehealth (HOSPITAL_COMMUNITY): Payer: Self-pay

## 2023-02-23 ENCOUNTER — Telehealth (HOSPITAL_COMMUNITY): Payer: Self-pay | Admitting: Pharmacy Technician

## 2023-02-23 ENCOUNTER — Other Ambulatory Visit (HOSPITAL_COMMUNITY): Payer: Self-pay

## 2023-02-23 ENCOUNTER — Other Ambulatory Visit (HOSPITAL_COMMUNITY): Payer: Self-pay | Admitting: Pharmacist

## 2023-02-23 NOTE — Telephone Encounter (Signed)
Advanced Heart Failure Patient Advocate Encounter  Patient did not tolerate Tyvaso DPI. Per provider, d/c Tyvaso and start Uptravi. Referral and PA approval sent via fax.

## 2023-02-23 NOTE — Telephone Encounter (Addendum)
Patient Advocate Encounter  Prior authorization for Cordie Grice has been submitted and approved.  Key: [id number] Effective: 06/29/2022 to 06/29/2023  Burnell Blanks, CPhT Rx Patient Advocate Phone: [phone]

## 2023-02-24 DIAGNOSIS — I509 Heart failure, unspecified: Secondary | ICD-10-CM | POA: Diagnosis not present

## 2023-03-04 ENCOUNTER — Other Ambulatory Visit (HOSPITAL_COMMUNITY): Payer: Self-pay | Admitting: Pharmacist

## 2023-03-04 DIAGNOSIS — M810 Age-related osteoporosis without current pathological fracture: Secondary | ICD-10-CM | POA: Diagnosis not present

## 2023-03-04 DIAGNOSIS — M85859 Other specified disorders of bone density and structure, unspecified thigh: Secondary | ICD-10-CM | POA: Diagnosis not present

## 2023-03-04 MED ORDER — UPTRAVI TITRATION 200 & 800 MCG PO TBPK: 200.0000 ug | ORAL_TABLET | Freq: Two times a day (BID) | ORAL | Status: DC

## 2023-03-04 NOTE — Progress Notes (Signed)
Received message from [facility]:   This patient has been cleared for the [facility] referral. Nursing and pharmacy will be working with the patient to set up the start of care.   Will add Uptravi to medication list.   Karle Plumber, PharmD, BCPS, BCCP, CPP Heart Failure Clinic Pharmacist [phone]

## 2023-03-05 ENCOUNTER — Ambulatory Visit: Payer: Medicare HMO

## 2023-03-05 NOTE — Telephone Encounter (Signed)
Advanced Heart Failure Patient Advocate Encounter  Per [facility], "This patient has been cleared for the [facility] referral. Nursing and pharmacy will be working with the patient to set up the start of care."  Hopefully will have update next week.

## 2023-03-10 ENCOUNTER — Ambulatory Visit: Payer: Medicare HMO

## 2023-03-12 ENCOUNTER — Ambulatory Visit: Payer: Medicare HMO | Attending: Cardiology

## 2023-03-12 ENCOUNTER — Telehealth: Payer: Self-pay

## 2023-03-12 NOTE — Telephone Encounter (Signed)
Lpmtcb to reschedule INR appt

## 2023-03-20 ENCOUNTER — Encounter (HOSPITAL_COMMUNITY): Payer: Self-pay

## 2023-03-25 NOTE — Telephone Encounter (Signed)
Advanced Heart Failure Patient Advocate Encounter  Confirmed with [facility] that medication was shipped 09/11.  Archer Asa, CPhT

## 2023-03-27 DIAGNOSIS — I509 Heart failure, unspecified: Secondary | ICD-10-CM | POA: Diagnosis not present

## 2023-04-02 ENCOUNTER — Ambulatory Visit (HOSPITAL_COMMUNITY)
Admission: RE | Admit: 2023-04-02 | Discharge: 2023-04-02 | Disposition: A | Discharge status: Home / Self Care | Payer: Medicare HMO | Source: Ambulatory Visit | Attending: Internal Medicine | Admitting: Internal Medicine

## 2023-04-02 DIAGNOSIS — R55 Syncope and collapse: Secondary | ICD-10-CM | POA: Diagnosis not present

## 2023-04-02 DIAGNOSIS — D696 Thrombocytopenia, unspecified: Secondary | ICD-10-CM | POA: Diagnosis not present

## 2023-04-02 DIAGNOSIS — E854 Organ-limited amyloidosis: Secondary | ICD-10-CM

## 2023-04-02 DIAGNOSIS — I5032 Chronic diastolic (congestive) heart failure: Secondary | ICD-10-CM | POA: Diagnosis not present

## 2023-04-02 DIAGNOSIS — I509 Heart failure, unspecified: Secondary | ICD-10-CM | POA: Diagnosis not present

## 2023-04-02 DIAGNOSIS — I272 Pulmonary hypertension, unspecified: Secondary | ICD-10-CM | POA: Insufficient documentation

## 2023-04-02 DIAGNOSIS — I43 Cardiomyopathy in diseases classified elsewhere: Secondary | ICD-10-CM

## 2023-04-02 DIAGNOSIS — E785 Hyperlipidemia, unspecified: Secondary | ICD-10-CM | POA: Insufficient documentation

## 2023-04-02 LAB — ECHOCARDIOGRAM LIMITED
AV Mean grad: 7 mm[Hg]
AV Peak grad: 13.5 mm[Hg]
Ao pk vel: 1.84 m/s
Area-P 1/2: 3.34 cm2
S' Lateral: 2.1 cm

## 2023-04-06 ENCOUNTER — Other Ambulatory Visit (HOSPITAL_COMMUNITY): Payer: Self-pay | Admitting: Cardiology

## 2023-04-06 ENCOUNTER — Other Ambulatory Visit: Payer: Self-pay

## 2023-04-06 DIAGNOSIS — Z9861 Coronary angioplasty status: Secondary | ICD-10-CM

## 2023-04-06 DIAGNOSIS — I4891 Unspecified atrial fibrillation: Secondary | ICD-10-CM

## 2023-04-22 ENCOUNTER — Other Ambulatory Visit: Payer: Self-pay | Admitting: Cardiology

## 2023-04-26 DIAGNOSIS — I509 Heart failure, unspecified: Secondary | ICD-10-CM | POA: Diagnosis not present

## 2023-05-13 ENCOUNTER — Other Ambulatory Visit: Payer: Self-pay

## 2023-05-17 ENCOUNTER — Other Ambulatory Visit: Payer: Self-pay

## 2023-05-19 ENCOUNTER — Other Ambulatory Visit: Payer: Self-pay

## 2023-05-20 ENCOUNTER — Other Ambulatory Visit: Payer: Self-pay

## 2023-05-20 NOTE — Progress Notes (Signed)
Specialty Pharmacy Refill Coordination Note  SHARALYN MIKELS is a 82 y.o. female contacted today regarding refills of specialty medication(s) Tafamidis   Patient requested Delivery   Delivery date: 05/26/23   Verified address: [address] [place] [address]   Medication will be filled on 05/25/23.

## 2023-05-21 ENCOUNTER — Other Ambulatory Visit: Payer: Self-pay | Admitting: Cardiology

## 2023-05-25 ENCOUNTER — Other Ambulatory Visit: Payer: Self-pay

## 2023-05-27 DIAGNOSIS — I509 Heart failure, unspecified: Secondary | ICD-10-CM | POA: Diagnosis not present

## 2023-06-08 ENCOUNTER — Other Ambulatory Visit (HOSPITAL_COMMUNITY): Payer: Self-pay

## 2023-06-22 ENCOUNTER — Other Ambulatory Visit: Payer: Self-pay | Admitting: Cardiology

## 2023-06-24 NOTE — Telephone Encounter (Signed)
LMOM to schedule overdue INR

## 2023-06-26 ENCOUNTER — Other Ambulatory Visit (HOSPITAL_COMMUNITY): Payer: Self-pay | Admitting: Family Medicine

## 2023-06-26 DIAGNOSIS — I509 Heart failure, unspecified: Secondary | ICD-10-CM | POA: Diagnosis not present

## 2023-07-04 NOTE — Progress Notes (Signed)
 PATIENT: Vicki [name] DOB: [birth year]  REASON FOR VISIT: follow up HISTORY FROM: patient PRIMARY NEUROLOGIST: Dr. Buck  Chief Complaint  Patient presents with   Follow-up    Pt in 19, here with son Vicki [name] Pt is here for follow up on memory loss. Pt's son states her memory has been stable, still forgetting some names and mixing up things. Pt's son states that aricept was discontinued by her PCP because it was making pt agitated.  MMSE 15/30       HISTORY OF PRESENT ILLNESS: Today 07/04/23  Vicki [name] is a 83 y.o. female who has been followed in this office for memory disturbance. Returns today for follow-up.  She is here today with her son.  She continues to live with her son.  She is no longer driving.  Able to complete all ADLs independently.  Family does assist her with laundry and put away her close.  Her son does all the cooking.  Her kids handle her finances.  No change in mood or behavior.  Denies hallucinations.  Son reports that PCP took her off of the Aricept because it was contributing to agitation.  He does state that agitation improved once the medication was discontinued.  She does go to a day center daily.  Returns today for an evaluation.    HISTORY 12/15/2022 (all dictated new, as well as above notes, some dictation done in note pad or Word, outside of chart, may appear as copied):    She reports feeling stable, her son also reports that for the most part things are stable, he and his sister are in the process of enrolling her in a day program, [facility] in [place].  Her son has moved in with her.  She has had 1 episode of mood irritability around Mother's Day but overall is doing well, tolerates her medications, still takes donepezil 5 mg daily per PCP.  She hydrates well with water .  They had to stop giving her Ensure because it interferes with the   The patient's allergies, current medications, family history, past medical history, past  social history, past surgical history and problem list were reviewed and updated as appropriate.      Previously:    09/01/22: (She) reports being forgetful for the past several months.  Daughter reports that she has had some recent auditory and visual hallucinations but this was since her hospitalization in November and she went back to the hospital in January 2024.  She had a recent follow-up with primary care and was started on donepezil 5 mg once daily at bedtime in January 2024.  She takes vitamin B12 by mouth.  She was staying with her daughter for about a month prior to her acute illness right before Thanksgiving.  When she came home from the hospital she was staying with her daughter but then eventually moved back into her home.  She has been living by herself since 2021.  She lost her husband in 2014 but someone stayed with her more or less consistently until 2021.  She quit driving in 7981 or 7980 as she was more confused while driving.  She reports that her mom had memory loss, daughter reports that patient's mom had Alzheimer's dementia and lived to be in her early 34s.  Patient's father died in his early 81s from heart attack.  Patient is the second oldest of a total of 9 siblings, all brothers have passed away.  She has 3  sisters alive including her oldest sister who is 52.  None of her siblings have or had memory loss.  Patient worked for [employer] for over 40 years.  She has 3 children.  Her daughter Olam lives in [place], daughter Mitzie, who is here today lives locally and her son, AL, lives locally as well and is getting ready with his family to move into patient's home so she does not stay by herself. The patient is aware of her hallucinations recently and has good insight. Of note, she does not drink a whole lot of water , maybe 3 to 4 cups/day.  She does drink a little bit of soda, usually 1 bottle of Sprite, she drinks alcohol very rarely, maybe on special occasions.  She is a  non-smoker.  She has not fallen recently but had to catch herself recently.  She does not use a walking aid typically.  REVIEW OF SYSTEMS: Out of a complete 14 system review of symptoms, the patient complains only of the following symptoms, and all other reviewed systems are negative.  ALLERGIES: No Known Allergies  HOME MEDICATIONS: Outpatient Medications Prior to Visit  Medication Sig Dispense Refill   warfarin (COUMADIN ) 5 MG tablet TAKE 1/2 TO 1 TABLET BY MOUTH DAILY AS DIRECTED BY COUMADIN  CLINIC *NEEDS INR CHECK FOR REFILLS CALL OFFICE [phone]* 10 tablet 0   acetaminophen  (TYLENOL ) 500 MG tablet Take 500 mg by mouth every 6 (six) hours as needed for mild pain or headache.      busPIRone  (BUSPAR ) 5 MG tablet Take 5 mg by mouth every morning.     Cholecalciferol  (VITAMIN D3) 2000 units TABS Take 1 tablet by mouth daily.      cyanocobalamin  (VITAMIN B12) 1000 MCG tablet Take 1 tablet (1,000 mcg total) by mouth daily. 30 tablet 0   donepezil (ARICEPT) 5 MG tablet Take 5 mg by mouth daily.     KLOR-CON  M20 20 MEQ tablet TAKE TWO TABLETS BY MOUTH IN THE MORNING AND ONE IN THE EVENING 90 tablet 0   levocetirizine (XYZAL) 5 MG tablet Take 2.5 mg by mouth daily in the afternoon.     OPSUMIT  10 MG tablet TAKE 1 TABLET (10MG ) BY MOUTH DAILY WITH BREAKFAST 30 tablet 6   polyethylene glycol (MIRALAX ) 17 g packet Take 17 g by mouth daily. 30 each 0   psyllium (HYDROCIL/METAMUCIL) 95 % PACK Take 1 packet by mouth as needed for mild constipation.     rosuvastatin  (CRESTOR ) 20 MG tablet Take 20 mg by mouth at bedtime.      Selexipag  (UPTRAVI  TITRATION) 200 & 800 MCG TBPK Take 200 mcg by mouth 2 (two) times daily. Uptitrate by 200 mcg BID weekly to target dose of 1600 mcg BID.     SENNA PO Take by mouth as needed.     spironolactone  (ALDACTONE ) 25 MG tablet TAKE 1/2 TABLET BY MOUTH ONCE DAILY 15 tablet 10   tadalafil , PAH, (ADCIRCA ) 20 MG tablet TAKE 2 TABLETS DAILY (Generic for Adcirca ) 60 tablet  11   Tafamidis  (VYNDAMAX ) 61 MG CAPS Take 1 capsule by mouth daily. 90 capsule 3   torsemide  (DEMADEX ) 20 MG tablet Take 1.5 tablets (30 mg total) by mouth 2 (two) times daily. 90 tablet 6   No facility-administered medications prior to visit.    PAST MEDICAL HISTORY: Past Medical History:  Diagnosis Date   Arthritis    elbow   Atrial fibrillation (HCC)    Cancer (HCC)    Mylenoma- top of  head   CHF (congestive heart failure) (HCC)    Dysrhythmia    Heart murmur    On home oxygen  therapy    4 bliters   Pulmonary hypertension (HCC)    Seasonal allergies    Thrombocytopenia (HCC) 7/16    PAST SURGICAL HISTORY: Past Surgical History:  Procedure Laterality Date   BREAST LUMPECTOMY Right    benign   CARDIAC CATHETERIZATION  08/12/2006   no significant disease   CARDIAC CATHETERIZATION  10/28/2012   right heart cath done   DOPPLER ECHOCARDIOGRAPHY  Aug 09 2012   severe LVH  with mild cavity obliteration. EF 55-60% without normal relaxationbut difficult to really tell total diastolic function due to a fib; sclerotic aortic valve with no stenosis. mod mitral regurg. mod to severely dilated left atrium; mod dilated rgt right ventricle w/elevated pressures, estimated peak PA presures @86mmHG  TR jet calculation. RGT ATRIUM MOD to SEVERE,     NM MYOCAR PERF WALL MOTION  2004   EXERCISE  ,no ischemia   PARS PLANA VITRECTOMY Right 04/03/2015   Procedure: PARS PLANA VITRECTOMY WITH 25 GAUGE FOR VETREOUS HEMORRHAGE, ENDO LASER;  Surgeon: Onesimo Blanch, MD;  Location: [facility] OR;  Service: Ophthalmology;  Laterality: Right;   RIGHT HEART CATHETERIZATION N/A 09/07/2013   Procedure: RIGHT HEART CATH;  Surgeon: Ezra GORMAN Shuck, MD;  Location: [facility] CATH LAB;  Service: Cardiovascular;  Laterality: N/A;   VAGINAL HYSTERECTOMY      FAMILY HISTORY: Family History  Problem Relation Age of Onset   Heart Problems Brother    Hypertension Brother    Alzheimer's disease Mother    Heart attack Father     Thyroid  disease Maternal Aunt    Pulmonary Hypertension Cousin    Heart Problems Maternal Aunt    Thyroid  disease Daughter    Thyroid  disease Daughter     SOCIAL HISTORY: Social History   Socioeconomic History   Marital status: Widowed    Spouse name: Not on file   Number of children: Not on file   Years of education: Not on file   Highest education level: Not on file  Occupational History   Occupation: retired  Tobacco Use   Smoking status: Never   Smokeless tobacco: Never  Substance and Sexual Activity   Alcohol use: Not Currently    Comment: occ   Drug use: No   Sexual activity: Not on file  Other Topics Concern   Not on file  Social History Narrative   Not on file   Social Drivers of Health   Financial Resource Strain: Low Risk  (05/19/2022)   Overall Financial Resource Strain (CARDIA)    Difficulty of Paying Living Expenses: Not hard at all  Food Insecurity: No Food Insecurity (05/19/2022)   Hunger Vital Sign    Worried About Running Out of Food in the Last Year: Never true    Ran Out of Food in the Last Year: Never true  Transportation Needs: No Transportation Needs (05/25/2022)   PRAPARE - Administrator, Civil Service (Medical): No    Lack of Transportation (Non-Medical): No  Physical Activity: Not on file  Stress: Not on file  Social Connections: Not on file  Intimate Partner Violence: Not At Risk (05/19/2022)   Humiliation, Afraid, Rape, and Kick questionnaire    Fear of Current or Ex-Partner: No    Emotionally Abused: No    Physically Abused: No    Sexually Abused: No      PHYSICAL EXAM  Vitals:   07/05/23 1429  BP: 120/76  Pulse: 82  Weight: 132 lb (59.9 kg)  Height: 5' 3 (1.6 m)   Body mass index is 23.38 kg/m.     07/05/2023    2:39 PM 09/01/2022    1:58 PM  MMSE - Mini Mental State Exam  Orientation to time 0 4  Orientation to Place 2 4  Registration 3 3  Attention/ Calculation 2 1  Recall 0 1  Language- name 2  objects 2 2  Language- repeat 1 1  Language- follow 3 step command 3 3  Language- read & follow direction 1 1  Write a sentence 1 1  Copy design 0 0  Total score 15 21     Generalized: Well developed, in no acute distress   Neurological examination  Mentation: Alert oriented to time, place, history taking. Follows all commands speech and language fluent Cranial nerve II-XII: Pupils were equal round reactive to light. Extraocular movements were full, visual field were full on confrontational test. Facial sensation and strength were normal. Uvula tongue midline. Head turning and shoulder shrug  were normal and symmetric. Motor: The motor testing reveals 5 over 5 strength of all 4 extremities. Good symmetric motor tone is noted throughout.  Sensory: Sensory testing is intact to soft touch on all 4 extremities. No evidence of extinction is noted.  Coordination: Cerebellar testing reveals good finger-nose-finger and heel-to-shin bilaterally.  Gait and station: Gait is normal.  Reflexes: Deep tendon reflexes are symmetric and normal bilaterally.   DIAGNOSTIC DATA (LABS, IMAGING, TESTING) - I reviewed patient records, labs, notes, testing and imaging myself where available.  Lab Results  Component Value Date   WBC 4.0 01/22/2023   HGB 12.3 01/22/2023   HCT 39.8 01/22/2023   MCV 101.5 (H) 01/22/2023   PLT 102 (L) 01/22/2023      Component Value Date/Time   NA 139 01/22/2023 1242   NA 141 05/08/2015 1255   K 4.5 01/22/2023 1242   K 3.6 05/08/2015 1255   CL 103 01/22/2023 1242   CL 105 06/13/2012 1345   CO2 29 01/22/2023 1242   CO2 28 05/08/2015 1255   GLUCOSE 91 01/22/2023 1242   GLUCOSE 109 05/08/2015 1255   GLUCOSE 139 (H) 06/13/2012 1345   BUN 23 01/22/2023 1242   BUN 16.2 05/08/2015 1255   CREATININE 1.53 (H) 01/22/2023 1242   CREATININE 1.1 05/08/2015 1255   CALCIUM  9.7 01/22/2023 1242   CALCIUM  9.5 05/08/2015 1255   PROT 7.7 07/26/2022 2150   PROT 7.4 05/08/2015 1255    ALBUMIN  4.0 07/26/2022 2150   ALBUMIN  3.8 05/08/2015 1255   AST 28 07/26/2022 2150   AST 27 05/08/2015 1255   ALT 12 07/26/2022 2150   ALT 11 05/08/2015 1255   ALKPHOS 34 (L) 07/26/2022 2150   ALKPHOS 38 (L) 05/08/2015 1255   BILITOT 0.7 07/26/2022 2150   BILITOT 0.72 05/08/2015 1255   GFRNONAA 34 (L) 01/22/2023 1242   GFRAA 52 (L) 12/19/2019 1456   Lab Results  Component Value Date   CHOL 141 01/22/2023   HDL 65 01/22/2023   LDLCALC 63 01/22/2023   TRIG 64 01/22/2023   CHOLHDL 2.2 01/22/2023   No results found for: HGBA1C Lab Results  Component Value Date   VITAMINB12 269 05/17/2022   Lab Results  Component Value Date   TSH 0.814 05/16/2022      ASSESSMENT AND PLAN 83 y.o. year old female  has a past medical history of  Arthritis, Atrial fibrillation (HCC), Cancer (HCC), CHF (congestive heart failure) (HCC), Dysrhythmia, Heart murmur, On home oxygen  therapy, Pulmonary hypertension (HCC), Seasonal allergies, and Thrombocytopenia (HCC) (7/16). here with:  1.  Dementia  - MMSE 15/30 previously 21/30 -Unable to tolerate Aricept -Discussed starting Namenda .  Patient and her son is amenable.  She will start  by taking 5 mg at bedtime for 1 week then increase to 5 mg twice a day thereafter. -Reviewed potential side effects, contraindications with the patient and her son.  Also provided them information on her after visit summary. -Follow-up in 6 to 8 months or sooner if needed       Duwaine Russell, MSN, NP-C 07/04/2023, 6:41 PM [facility] [address], [address] [place], [place] [address] [phone]

## 2023-07-05 ENCOUNTER — Ambulatory Visit (INDEPENDENT_AMBULATORY_CARE_PROVIDER_SITE_OTHER): Payer: Medicare HMO | Admitting: Adult Health

## 2023-07-05 ENCOUNTER — Encounter: Payer: Self-pay | Admitting: Adult Health

## 2023-07-05 ENCOUNTER — Ambulatory Visit: Payer: Medicare HMO | Attending: Cardiology

## 2023-07-05 VITALS — BP 120/76 | HR 82 | Ht 63.0 in | Wt 132.0 lb

## 2023-07-05 DIAGNOSIS — Z5181 Encounter for therapeutic drug level monitoring: Secondary | ICD-10-CM

## 2023-07-05 DIAGNOSIS — F02B0 Dementia in other diseases classified elsewhere, moderate, without behavioral disturbance, psychotic disturbance, mood disturbance, and anxiety: Secondary | ICD-10-CM | POA: Diagnosis not present

## 2023-07-05 DIAGNOSIS — G301 Alzheimer's disease with late onset: Secondary | ICD-10-CM

## 2023-07-05 LAB — POCT INR: INR: 1.2 — AB (ref 2.0–3.0)

## 2023-07-05 MED ORDER — MEMANTINE HCL 5 MG PO TABS
ORAL_TABLET | ORAL | 5 refills | Status: DC
Start: 2023-07-05 — End: 2023-08-27

## 2023-07-05 NOTE — Patient Instructions (Signed)
 TAKE 1.5 TABLETS TODAY and Tuesday then continue taking warfarin 1 tablet daily except for 1/2 a tablet on Mondays and Fridays.  Stay consistent with greens and boost each week:  Boost 1 time per week  & Greens 1-2 per week  Recheck INR in 2 weeks.  Coumadin  Clinic [phone]

## 2023-07-05 NOTE — Patient Instructions (Addendum)
 Your Plan:  Start namenda  5 mg at bedtime for 1 week, then increase to 5 mg twice a day  If tolerating well, after 3-4 week call and we will increase dose further If your symptoms worsen or you develop new symptoms please let us  know.    Thank you for coming to see us  at [facility]. I hope we have been able to provide you high quality care today.  You may receive a patient satisfaction survey over the next few weeks. We would appreciate your feedback and comments so that we may continue to improve ourselves and the health of our patients.

## 2023-07-16 ENCOUNTER — Telehealth (HOSPITAL_COMMUNITY): Payer: Self-pay | Admitting: Pharmacy Technician

## 2023-07-16 NOTE — Telephone Encounter (Signed)
Advanced Heart Failure Patient Advocate Encounter  Prior Authorization for Vicki [name] has been approved.    Effective dates: 06/30/23 through 06/28/24  Archer Asa, CPhT

## 2023-07-16 NOTE — Telephone Encounter (Signed)
Patient Advocate Encounter   Received notification from [employer] that prior authorization for Uptravi is required.   PA submitted on CoverMyMeds Key [facility] Status is pending   Will continue to follow.

## 2023-07-19 ENCOUNTER — Ambulatory Visit: Payer: Medicare HMO | Attending: Cardiovascular Disease

## 2023-07-19 ENCOUNTER — Other Ambulatory Visit: Payer: Self-pay | Admitting: Cardiology

## 2023-07-19 DIAGNOSIS — Z5181 Encounter for therapeutic drug level monitoring: Secondary | ICD-10-CM | POA: Diagnosis not present

## 2023-07-19 LAB — POCT INR: INR: 1.8 — AB (ref 2.0–3.0)

## 2023-07-19 NOTE — Patient Instructions (Signed)
Take 1 tablet today only then Increase to 1 tablet daily except for 1/2 a tablet on Mondays.  Stay consistent with greens and boost each week:  Boost 2 time per week  & Greens 1-2 per week  Recheck INR in 3 weeks.  Coumadin Clinic [phone]

## 2023-07-26 ENCOUNTER — Telehealth: Payer: Self-pay

## 2023-07-26 NOTE — Telephone Encounter (Signed)
Pt's daughter wants to if ok to take [employer] brand cold and flu since she is on coumadin. Symptoms are Runny nose sneezing and slight cough

## 2023-07-26 NOTE — Telephone Encounter (Signed)
Called and spoke with pt's daughter. Confirmed this medication should be safe and educated on medication she should avoid.

## 2023-07-27 DIAGNOSIS — I509 Heart failure, unspecified: Secondary | ICD-10-CM | POA: Diagnosis not present

## 2023-07-28 ENCOUNTER — Other Ambulatory Visit: Payer: Self-pay | Admitting: Cardiology

## 2023-07-28 DIAGNOSIS — I4891 Unspecified atrial fibrillation: Secondary | ICD-10-CM

## 2023-07-29 NOTE — Telephone Encounter (Signed)
Warfarin 5mg  refill Dx-Afib Last INR 07/19/23 Last OV 01/22/23

## 2023-08-06 ENCOUNTER — Other Ambulatory Visit (HOSPITAL_COMMUNITY): Payer: Self-pay

## 2023-08-06 ENCOUNTER — Other Ambulatory Visit: Payer: Self-pay

## 2023-08-06 ENCOUNTER — Telehealth (HOSPITAL_COMMUNITY): Payer: Self-pay | Admitting: Pharmacy Technician

## 2023-08-06 ENCOUNTER — Encounter (HOSPITAL_COMMUNITY): Payer: Self-pay

## 2023-08-06 NOTE — Progress Notes (Signed)
 Specialty Pharmacy Refill Coordination Note  Vicki [name] is a 83 y.o. female contacted today regarding refills of specialty medication(s) Tafamidis  (Vyndamax )   Patient requested Delivery   Delivery date: 08/13/23   Verified address: [address] [name] [address]   Medication will be filled on 08/12/23. Spoke to daughter Mitzie.   Messaged [place] about updated PA.

## 2023-08-06 NOTE — Progress Notes (Signed)
 Specialty Pharmacy Ongoing Clinical Assessment Note  Vicki [name] is a 83 y.o. female who is being followed by the specialty pharmacy service for RxSp Cardiology   Patient's specialty medication(s) reviewed today: Tafamidis  (Vyndamax )   Missed doses in the last 4 weeks: 0   Patient/Caregiver did not have any additional questions or concerns.   Therapeutic benefit summary: Patient is achieving benefit   Adverse events/side effects summary: No adverse events/side effects   Patient's therapy is appropriate to: Continue    Goals Addressed             This Visit's Progress    Stabilization of disease       Patient is on track. Patient will maintain adherence         Follow up:  6 months  Vicki [name] Specialty Pharmacist

## 2023-08-06 NOTE — Telephone Encounter (Signed)
 Advanced Heart Failure Patient Advocate Encounter  Prior Authorization for Vyndamax  has been approved.    PA# [id number] Effective dates: 06/30/23 through 06/28/24  Patients co-pay is $0  Correne Dillon, CPhT

## 2023-08-06 NOTE — Telephone Encounter (Signed)
 Patient Advocate Encounter   Received notification from [employer] that prior authorization for Vyndamax  is required.   PA submitted on CoverMyMeds Key [id number] Status is pending   Will continue to follow.

## 2023-08-10 ENCOUNTER — Other Ambulatory Visit (HOSPITAL_COMMUNITY): Payer: Self-pay

## 2023-08-13 ENCOUNTER — Ambulatory Visit: Payer: Medicare HMO

## 2023-08-25 ENCOUNTER — Other Ambulatory Visit (HOSPITAL_COMMUNITY): Payer: Self-pay

## 2023-08-27 ENCOUNTER — Encounter (HOSPITAL_COMMUNITY): Payer: Self-pay | Admitting: Cardiology

## 2023-08-27 ENCOUNTER — Ambulatory Visit (HOSPITAL_COMMUNITY)
Admission: RE | Admit: 2023-08-27 | Discharge: 2023-08-27 | Disposition: A | Discharge status: Home / Self Care | Payer: Medicare HMO | Source: Ambulatory Visit | Attending: Cardiology | Admitting: Cardiology

## 2023-08-27 VITALS — BP 90/50 | HR 45 | Wt 128.0 lb

## 2023-08-27 DIAGNOSIS — I2721 Secondary pulmonary arterial hypertension: Secondary | ICD-10-CM | POA: Insufficient documentation

## 2023-08-27 DIAGNOSIS — Z7901 Long term (current) use of anticoagulants: Secondary | ICD-10-CM | POA: Diagnosis not present

## 2023-08-27 DIAGNOSIS — I482 Chronic atrial fibrillation, unspecified: Secondary | ICD-10-CM | POA: Diagnosis not present

## 2023-08-27 DIAGNOSIS — Z9981 Dependence on supplemental oxygen: Secondary | ICD-10-CM | POA: Diagnosis not present

## 2023-08-27 DIAGNOSIS — I509 Heart failure, unspecified: Secondary | ICD-10-CM | POA: Diagnosis not present

## 2023-08-27 DIAGNOSIS — F0390 Unspecified dementia without behavioral disturbance: Secondary | ICD-10-CM | POA: Diagnosis not present

## 2023-08-27 DIAGNOSIS — L03031 Cellulitis of right toe: Secondary | ICD-10-CM | POA: Diagnosis not present

## 2023-08-27 DIAGNOSIS — Z79899 Other long term (current) drug therapy: Secondary | ICD-10-CM | POA: Insufficient documentation

## 2023-08-27 DIAGNOSIS — I5032 Chronic diastolic (congestive) heart failure: Secondary | ICD-10-CM | POA: Insufficient documentation

## 2023-08-27 DIAGNOSIS — E785 Hyperlipidemia, unspecified: Secondary | ICD-10-CM | POA: Diagnosis not present

## 2023-08-27 DIAGNOSIS — I110 Hypertensive heart disease with heart failure: Secondary | ICD-10-CM | POA: Diagnosis not present

## 2023-08-27 LAB — CBC
HCT: 38.9 % (ref 36.0–46.0)
Hemoglobin: 12.4 g/dL (ref 12.0–15.0)
MCH: 31.7 pg (ref 26.0–34.0)
MCHC: 31.9 g/dL (ref 30.0–36.0)
MCV: 99.5 fL (ref 80.0–100.0)
Platelets: 95 10*3/uL — ABNORMAL LOW (ref 150–400)
RBC: 3.91 MIL/uL (ref 3.87–5.11)
RDW: 13.5 % (ref 11.5–15.5)
WBC: 3.7 10*3/uL — ABNORMAL LOW (ref 4.0–10.5)
nRBC: 0 % (ref 0.0–0.2)

## 2023-08-27 LAB — BASIC METABOLIC PANEL
Anion gap: 9 (ref 5–15)
BUN: 30 mg/dL — ABNORMAL HIGH (ref 8–23)
CO2: 27 mmol/L (ref 22–32)
Calcium: 9.3 mg/dL (ref 8.9–10.3)
Chloride: 104 mmol/L (ref 98–111)
Creatinine, Ser: 1.66 mg/dL — ABNORMAL HIGH (ref 0.44–1.00)
GFR, Estimated: 31 mL/min — ABNORMAL LOW (ref >60)
Glucose, Bld: 86 mg/dL (ref 70–99)
Potassium: 4.2 mmol/L (ref 3.5–5.1)
Sodium: 140 mmol/L (ref 135–145)

## 2023-08-27 LAB — BRAIN NATRIURETIC PEPTIDE: B Natriuretic Peptide: 717.7 pg/mL — ABNORMAL HIGH (ref 0.0–100.0)

## 2023-08-27 MED ORDER — TORSEMIDE 20 MG PO TABS: 40.0000 mg | ORAL_TABLET | Freq: Every day | ORAL | 3 refills | Status: DC

## 2023-08-27 NOTE — Patient Instructions (Signed)
 CHANGE Torsemide to 40 mg daily.  Labs done today, your results will be available in MyChart, we will contact you for abnormal readings.  Your physician recommends that you schedule a follow-up appointment in: 3 months.  If you have any questions or concerns before your next appointment please send [place] a message through [place] or call our office at [phone].    TO LEAVE A MESSAGE FOR THE NURSE SELECT OPTION 2, PLEASE LEAVE A MESSAGE INCLUDING: YOUR NAME DATE OF BIRTH CALL BACK NUMBER REASON FOR CALL**this is important as we prioritize the call backs  YOU WILL RECEIVE A CALL BACK THE SAME DAY AS LONG AS YOU CALL BEFORE 4:00 PM  At the [facility], you and your health needs are our priority. As part of our continuing mission to provide you with exceptional heart care, we have created designated Provider Care Teams. These Care Teams include your primary Cardiologist (physician) and Advanced Practice Providers (APPs- Physician Assistants and Nurse Practitioners) who all work together to provide you with the care you need, when you need it.   You may see any of the following providers on your designated Care Team at your next follow up: Dr Arvilla Meres Dr Marca Ancona Dr. Dorthula Nettles Dr. Clearnce Hasten Amy Filbert Schilder, NP Robbie Lis, [place] [facility] [place], [place] Brynda Peon, NP [place] Lee, NP Clarisa Kindred, NP Karle Plumber, PharmD Enos Fling, PharmD   Please be sure to bring in all your medications bottles to every appointment.    Thank you for choosing [facility]

## 2023-08-29 NOTE — Progress Notes (Signed)
 Date:  08/29/2023   ID:  Vicki [name], DOB September 02, 1940, MRN [id number]   Provider location: [facility] Type of Visit: Established patient   PCP:  Vicki Brunette, MD  HF Cardiologist:  Dr. Shirlee Latch  Chief complaint: CHF   HPI: Vicki [name] is a 83 y.o. female who has a history of chronic diastolic CHF, pulmonary hypertension and chronic atrial fibrillation.  Patient was followed by Dr. Aleen Campi in the past for chronic atrial fibrillation.  She has been on coumadin.  She reports progressive exertional dyspnea since 2011.  This gradually worsened and became quite significant over the last few months.  She used to have significant HTN, but more recently her BP has been on the lower side.  Echo was done in 2/14, showing severe concentric LVH with EF 55-60%, moderately dilated RV, moderate to severe TR, and PA systolic pressure 86 mmHg.  I did a right heart cath in 5/14.  This showed PA pressure 104/36 with PCWP 20, suggesting pulmonary arterial HTN well out of proportion to the mildly elevate wedge pressure.  She was already on amlodipine so I did not do vasodilator testing.  V/Q scan was done, showing no evidence for chronic PEs.  PFTs showed a restrictive defect. Cardiac MRI did not show definite evidence for amyloid.  I started her on macitentan 10 mg daily.  Initially, she felt better on macitentan.  However, she was admitted in 5/14 from her sleep study due to orthopnea and dyspnea.  She was diuresed for several days and diuretic was switched over to torsemide.  I next started her on tadalafil 20 mg daily and titrated up to 40 mg daily.  She thinks that this helped.  She saw pulmonary after chest CT (showed mosaic attenuation in lungs).  This was thought to be due to air-trapping rather than ILD.  She was started on Spiriva. She wears oxygen at home.    She had an echocardiogram in 2/15 that showed severe LVH, EF 75%, small pericardial effusion, mildly dilated RV with mildly  decreased systolic function, PA systolic pressure 84 mmHg.  She is not totally sure if she was taking macitentan and tadalafil at that time.  She has had a hard month.  She ran out of her macitentan and tadalafil and her insurance company refused to refill them.  After this, she took a decided turn for the worse.  [place] in the fall.  She became much more short of breath, just with walking around the house. She developed lightheaded spells, especially with micturation.  Given lightheadedness and presyncope, she was admitted last week.  She was restarted on her medications and she finally got back on her meds at home.  In the hospital, she was noted to be bradycardic with HR to 30s so metoprolol was stopped.  Of note, her abdominal fat pad biopsy did not suggest amyloidosis. Repeat RHC in 3/15 still with moderate to severe PAH and low CI.  Holter off beta blocker in 3/15 showed average HR 73.    Echo in 9/17 showed EF 65-70% with moderate to severe LVH, moderate MR, normal RV size with mildly decreased systolic function, PASP 56 mmHg, small pericardial effusion.    Echo in 10/18 showed EF 60-65% with moderate LVH, moderate MR, severe biatrial enlargement, PASP 62 mmHg, RV normal.  PYP scan in 10/18 was not suggestive of TTR amyloidosis.  Echo in 10/19 showed EF 65-70%, moderate MR, normal RV size and systolic function, PASP 43 mmHg.   Echo in 12/20 showed EF 65-70%, moderate LVH, severe biatrial enlargement, mild-moderate MR, small pericardial effusion, unable to estimate PA systolic pressure.   PYP scan in 12/20 was visually grade 2 with H/CL 1.96 but activity appeared localized to the atria. Invitae gene testing for TTR amyloidosis in 3/21 was negative.   Echo in 2/22 showed EF 60-65%, moderate LVH, normal RV, PASP 37 mmHg, moderate central MR.  PYP scan repeated 9/22 was grade 2 with H/CL 1.28 => isolated atrial uptake  again.  Echo in 5/23 showed EF 60-65%, severe concentric LVH, normal RV, PASP 49 mmHg, moderate MR, small pericardial effusion.   Admitted 05/14/22 with sepsis in the setting of flu and pneumonia. Placed on Tamiflu and antibiotics. AHF consulted to help with HF/PH meds due to AKI. Discharged home, weight 137 lbs.  Echo in 10/24 showed EF 60-65%, moderate LVH, RV normal, severe biatrial enlargement, small-moderate pericardial effusion, moderate MR, normal IVC, PASP 45 mmHg.   Today she returns for HF follow up with her daughter. Her memory has gradually worsened.  She cannot use Tyvaso DPI and is now on Uptravi.  She increased dose to 600 mcg bid but had limiting diarrhea at higher dose.  She is sleeping more during the day.  She does get out to go to an adult daycare program.  Breathing is stable, no dyspnea walking around the house. No chest pain.  No orthopnea/PND.  No cough/wheezing. No lightheadedness. Weight is stable.   ECG (personally reviewed): atrial fibrillation rate 47, inferolateral TWIs  6 minute walk (5/14): 122 m.   6 minute walk (7/14): 152 m 6 minute walk (10/14): 183 m 6 minute walk (4/15): 317 m 6 minute walk (12/15): 259 m 6 minute walk (4/16): 293 m 6 minute walk (8/16): 341 m 6 minute walk (12/16): 274 m 6 minute walk (6/17): 314 m 6 minute walk (9/17): 307 m 6 minute walk (4/18): 366 m 6 minute walk (10/18): 320 m 6 minute walk (3/19): 381 m 6 minute walk (9/19): 366 m 6 minute walk (9/20): 305 m 6 minute walk (12/20): 243 m 6 minute walk (6/21): 304 m 6 minute walk (11/21): 323 m 6 minute walk (2/22): 305 m 6 minute walk (5/22): 366 m 6 minute walk (1/23): 366 m 6 minute walk (5/23): 314 m   Labs (12/13): HCT 45.1, plts 153, K 3.5, creatinine 1.0 Labs (1/14): BNP 849 Labs (4/14): K 3.1, creatinine 1.0, ANA and anti-SCL-70 antibody negative.  Rheumatoid factor negative.  Serum immunofixation did not show monoclonal light chains.  Labs (5/14): K 3.3,  creatinine 0.79, proBNP 5147 Labs (6/14): K 4, creatinine 0.9, BNP 1001 Labs (10/14): LDL 53, LDL-P 975, K 3.7, creatinine 1.2 Labs (11/14): K 3.7, creatinine 0.9, BNP 832 Labs (2/15): K 3.5, creatinine 1.10, HCT 42.6, UPEP negative, SPEP negative.  Labs (3/15): K 3.8, creatinine 0.88 Labs (4/15): K 3.7, creatinine 0.99, proBNP 1653 Labs (7/15): K 4, creatinine 0.93 Labs (12/15): LDL 77, HDL 58, K 3.9, creatinine 4.09, LFTs normal Labs (4/16): K 3.8, creatinine 0.93, BNP 475, plts 105, HCT 42.3 Labs (11/16): K 3.6, creatinine 1.1, HCT 42.2 Labs (12/16): LDL 54, HDL 50, BNP 628 Labs (6/17): K 3.7, creatinine 0.97, HCT 41.3, BNP 489 Labs (9/17): K 3.8, creatinine 0.97, LDL 65, HDL 54 Labs (9/18): K 4.1, creatinine 1.05, hgb 14.6, BNP 646 Labs (10/18): BNP 588, TSH  normal, LDL 73, HDL 48, K 3.5, creatinine 0.45, hgb 12.5, plts 91, LFTs normal Labs (3/19): K 4.1, creatinine 1.07, hgb 13.2 Labs (9/19): K 3.8, creatinine 1.1 Labs (7/20): K 3.9, creatinine 1.3, LDL 63 Labs (9/20): K 4.2, creatinine 1.05, hgb 12.6, BNP 632  Labs (12/20): Myeloma panel negative, urine immunofixation negative.  Labs (1/21): K 4.3, creatinine 1.12, LDL 63 Labs (3/21): K 3.8, creatinine 1.13 Labs (6/21): K 3.9, creatinine 1.16 Labs (11/21): LDL 63, K 4.1, creatinine 4.09 Labs (2/22): K 4.3, creatinine 1.14, hgb 11.5, BNP 915 Labs (5/22): LDL 65 Labs (6/22): K 4, creatinine 1.16 Labs (8/22): K 3.6, creatinine 1.05, BNP 720 Labs (2/23): K 3.8, creatinine 1.26, LDL 62, BNP 623 Labs (5/23): K 3.5, creatinine 1.18, BNP 477 Labs (11/23): K 4.3, creatinine 1.22 Labs (07/26/22): K 3.6 Creatinine 1.46, hgb 11.6 Labs (7/24): K 4.5, creatinine 1.53, hgb 12.3   PMH: 1. Chronic diastolic CHF: Echo (2/14) with EF 55-60%, severe LVH (no SAM, no asymmetric hypertrophy, no LVOT gradient), moderate-severe LAE, moderately dilated RV with mildly decreased systolic function, moderate to severe RAE, PA systolic pressure 86 mmHg,  moderate-severe TR, moderate MR, trivial pericardial effusion.  Cardiac MRI (5/14): EF 65%, severe LVH, no definite evidence for amyloidosis (no delayed enhancement, myocardium not difficult to null).  Echo (2/15) with EF 75%, severe LVH, grade II diastolic dysfunction, moderate MR, RV mildly dilated with mildly decreased systolic function, moderate TR, PA systolic pressure 84 mmHg, small pericardial effusion. Abdominal fat pad biopsy (2/15) showed no evidence for amyloidosis.  SPEP/UPEP negative 2/15.  - Echo (4/16) with EF 60-65%, severe LVH, RV mildly dilated with mildly decreased systolic function, PA systolic pressure 40 mmHg. - Echo (9/17) with EF 65-70%, severe LVH, moderate MR, normal RV size with mildly decreased systolic function, PASP 56 mmHg, small pericardial effusion.  - Echo (10/18): EF 60-65% with moderate LVH, moderate MR, severe biatrial enlargement, PASP 62 mmHg, RV normal. - PYP scan (10/18) was not suggestive of TTR amyloidosis.  - Echo (10/19): EF 65-70%, moderate MR, normal RV size and systolic function, PASP 43 mmHg. - Echo (12/20): EF 65-70%, moderate LVH, severe biatrial enlargement, mild-moderate MR, small pericardial effusion, unable to estimate PA systolic pressure.  - PYP scan (12/20): grade 2 with H/CL 1.96 but activity localized to the atria.  - Invitae gene testing for TTR amyloidosis in 3/21 was negative.  - Echo (2/22): EF 60-65%, moderate LVH, normal RV, PASP 37 mmHg, moderate central MR. - PYP scan (9/22): grade 2, H/CL 1.28 => isolated atrial uptake.  - Echo (5/23): EF 60-65%, severe concentric LVH, normal RV, PASP 49 mmHg, moderate MR, small pericardial effusion.  - Echo (10/24): EF 60-65%, moderate LVH, RV normal, severe biatrial enlargement, small-moderate pericardial effusion, moderate MR, normal IVC, PASP 45 mmHg.  2. Chronic atrial fibrillation since around 2004.  Developed bradycardia and metoprolol stopped 2/15. Holter (3/15) with average HR 73, atrial  fibrillation, 3.8 sec pause x 1 while asleep, PVCs.  3. HTN: For decades.  4. LHC (2/08) with no significant disease.  5. Chronic thrombocytopenia: ITP 6. Pulmonary arterial HTN: RHC (5/14) with mean RA 13, PA 104/36 (mean 63), mean PCWP 20 on right and 23 on left, CI 2.3 (Fick) and 1.6 (thermo), PVR 10.4 WU (Fick) and 15 WU (thermo).  Vasodilator testing not done as patient was already on amlodipine.  V/Q scan (5/14) with no evidence for chronic PE.  ANA, RF, and anti-SCL70 antibody negative.  PFTs (5/14) with FEV1  60%, FVC 54%, ratio 112%, TLC 61%, DLCO 43% => restrictive defect.  Sleep study (7/14) with no OSA.  CT chest with areas of mosaic attenuation in lungs (saw pulmonary, thought air trapping and not ILD). RHC (3/15) with RA mean 5, PA 66/31 mean 43, PCWP mean 11, Cardiac Index (Fick) 1.97, PVR 9.2 WU.  Patient was started on Tyvaso in 3/15.  Echo (4/16) with mildly dilated and mildly dysfunctional RV, PA systolic pressure 40 mmHg.  7. Chest pain: Cardiolite 11/21 with no ischemia.  8. Dementia: MRI brain 3/24 with no CVA, chronic ischemic changes.   Current Outpatient Medications  Medication Sig Dispense Refill   acetaminophen (TYLENOL) 500 MG tablet Take 500 mg by mouth every 6 (six) hours as needed for mild pain or headache.      Cholecalciferol (VITAMIN D3) 2000 units TABS Take 1 tablet by mouth daily.      cyanocobalamin (VITAMIN B12) 1000 MCG tablet Take 1 tablet (1,000 mcg total) by mouth daily. 30 tablet 0   KLOR-CON M20 20 MEQ tablet TAKE TWO TABLETS BY MOUTH IN THE MORNING AND ONE IN THE EVENING 90 tablet 0   OPSUMIT 10 MG tablet TAKE 1 TABLET (10MG ) BY MOUTH DAILY WITH BREAKFAST 30 tablet 6   polyethylene glycol (MIRALAX) 17 g packet Take 17 g by mouth daily. 30 each 0   rosuvastatin (CRESTOR) 20 MG tablet Take 20 mg by mouth at bedtime.      Selexipag (UPTRAVI) 200 MCG TABS Take 600 mcg by mouth 2 (two) times daily.     SENNA PO Take by mouth as needed.     spironolactone  (ALDACTONE) 25 MG tablet TAKE 1/2 TABLET BY MOUTH ONCE DAILY 15 tablet 10   tadalafil, PAH, (ADCIRCA) 20 MG tablet TAKE 2 TABLETS DAILY (Generic for Adcirca) 60 tablet 11   Tafamidis (VYNDAMAX) 61 MG CAPS Take 1 capsule by mouth daily. 90 capsule 3   warfarin (COUMADIN) 5 MG tablet TAKE 1/2 TO 1 TABLET BY MOUTH BY MOUTH DAILY AS DIRECTED BY COUMADIN CLINIC 30 tablet 2   torsemide (DEMADEX) 20 MG tablet Take 2 tablets (40 mg total) by mouth daily. 90 tablet 3   No current facility-administered medications for this encounter.    Allergies:   Patient has no known allergies.   Social History:  The patient  reports that she has never smoked. She has never used smokeless tobacco. She reports that she does not currently use alcohol. She reports that she does not use drugs.   Family History:  The patient's family history includes Alzheimer's disease in her mother; Heart Problems in her brother and maternal aunt; Heart attack in her father; Hypertension in her brother; Pulmonary Hypertension in her cousin; Thyroid disease in her daughter, daughter, and maternal aunt.   ROS:  Please see the history of present illness.   All other systems are personally reviewed and negative.   Recent Labs: 08/27/2023: B Natriuretic Peptide 717.7; BUN 30; Creatinine, Ser 1.66; Hemoglobin 12.4; Platelets 95; Potassium 4.2; Sodium 140  Personally reviewed   BP (!) 90/50   Pulse (!) 45   Wt 58.1 kg (128 lb)   SpO2 95%   BMI 22.67 kg/m   Wt Readings from Last 3 Encounters:  08/27/23 58.1 kg (128 lb)  07/05/23 59.9 kg (132 lb)  01/22/23 57.8 kg (127 lb 6.4 oz)   Physical Exam General: NAD Neck: No JVD, no thyromegaly or thyroid nodule.  Lungs: Clear to auscultation bilaterally with normal respiratory  effort. CV: Nondisplaced PMI.  Heart irregular S1/S2, no S3/S4, no murmur.  No peripheral edema.  No carotid bruit.  Normal pedal pulses.  Abdomen: Soft, nontender, no hepatosplenomegaly, no distention.  Skin:  Intact without lesions or rashes.  Neurologic: Alert and oriented x 3.  Psych: Normal affect. Extremities: No clubbing or cyanosis.  HEENT: Normal.   Assessment & Plan 1. Pulmonary HTN: Patient has severe pulmonary arterial HTN.  RHC in 3/15 showed CI 1.97, PVR 9. 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 Collagen vascular disease workup was negative (negative RF, ANA and negative anti-SCL-70).  V/Q scan was not suggestive of chronic PEs.  PFTs were suggestive of restrictive lung disease but CT chest and evaluation by pulmonary did not suggest interstitial lung disease.  Sleep study did not show OSA.  Echo in 3/22 showed normal RV size and systolic function, PASP estimation 37 mmHg.  Echo 5/23 showed EF 60-65%, severe concentric LVH, normal RV, PASP 49 mmHg, moderate MR, small pericardial effusion. Echo in 10/24 showed EF 60-65%, moderate LVH, RV normal, severe biatrial enlargement, small-moderate pericardial effusion, moderate MR, normal IVC, PASP 45 mmHg.  - Continue macitentan and Adcirca. - Continue Uptravi 600 mcg bid, she cannot increase further due to side effects.  2. Chronic diastolic CHF: EF preserved on echo with moderate concentric LVH and a small pericardial effusion.  It is possible that the LVH is due to years of HTN.  The cardiac MRI was not definitively suggestive of cardiac amyloidosis.  I was still concerned for amyloidosis given the appearance of the LV myocardium on echoes.  Abdominal fat pad biopsy in 2015 showed no evidence for amyloidosis. She has some symptoms suggestive of mild peripheral neuropathy.  PYP scan in 10/18 was not suggestive of TTR amyloidosis. I assessed her again for cardiac amyloidosis in 2020: myeloma panel and urine immunofixation negative, PYP scan in 12/20 was grade 2 with H/CL 1.97 but activity was localized to the atria.  Invitae gene testing for TTR amyloidosis was negative.  PYP scan repeated 9/22 was grade 2 with H/CL 1.28 => isolated atrial  uptake again.  Echo in 5/23 was consistent with cardiac amyloidosis, EF 60-65%, severe concentric LVH, normal RV, PASP 49 mmHg, moderate MR, small pericardial effusion. Echo in 10/24 showed EF 60-65%, moderate LVH, RV normal, severe biatrial enlargement, small-moderate pericardial effusion, moderate MR, normal IVC, PASP 45 mmHg.  She does not look volume overloaded on exam, NYHA class II-III symptoms.  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 This may be an earlier manifestation before LV involvement. This can be associated with atrial fibrillation. She will continue tafamidis.   - She will transition from torsemide 30 qam/10 qpm to torsemide 40 mg daily. BMET/BNP today.  - Continue spironolactone 12.5 mg daily. 3. HTN: BP is on the lower side now.  4. Chronic atrial fibrillation: Continue coumadin.  She is off metoprolol due to bradycardia. HR in upper 40s today but no lightheadedness or syncope.  Follow closely, suspect tachy-brady syndrome.  5. Hyperlipidemia: On Crestor.   Followup 3 months with APP  I spent 31 minutes reviewing records, interviewing/examining patient, and managing orders.   Marca Ancona  08/29/2023  [facility] [address] [facility] [place] [address] [phone] (office) [phone] (fax)

## 2023-09-03 DIAGNOSIS — L600 Ingrowing nail: Secondary | ICD-10-CM | POA: Diagnosis not present

## 2023-09-14 ENCOUNTER — Telehealth (HOSPITAL_COMMUNITY): Payer: Self-pay

## 2023-09-14 MED ORDER — UPTRAVI 200 MCG PO TABS: ORAL_TABLET | ORAL | 5 refills | Status: DC

## 2023-09-16 ENCOUNTER — Telehealth (HOSPITAL_COMMUNITY): Payer: Self-pay | Admitting: Pharmacist

## 2023-09-16 ENCOUNTER — Other Ambulatory Visit (HOSPITAL_COMMUNITY): Payer: Self-pay

## 2023-09-16 NOTE — Telephone Encounter (Signed)
 error

## 2023-09-16 NOTE — Telephone Encounter (Signed)
 Patient Advocate Encounter   Received notification from [employer] that quantity limit exception for Vicki [name] is required.   PA submitted on CoverMyMeds Key [id number] Status is pending   Will continue to follow.   Karle Plumber, PharmD, BCPS, BCCP, CPP [facility] Clinic Pharmacist [phone]

## 2023-09-21 ENCOUNTER — Other Ambulatory Visit (HOSPITAL_COMMUNITY): Payer: Self-pay | Admitting: Cardiology

## 2023-09-21 MED ORDER — UPTRAVI 600 MCG PO TABS: 600.0000 ug | ORAL_TABLET | Freq: Two times a day (BID) | ORAL | 3 refills | Status: AC

## 2023-09-21 NOTE — Telephone Encounter (Signed)
 [facility] Patient Advocate Encounter  Prior Authorization for Uptravi QL exception has been approved.    Effective dates: 06/30/23 through 21/31/25  Karle Plumber, PharmD, BCPS, BCCP, CPP [facility] Clinic Pharmacist [phone]

## 2023-09-21 NOTE — Telephone Encounter (Signed)
 [facility] pharmacy called to request maintence rx for uptravi -maintained dose reached at BID  Script sent

## 2023-09-24 DIAGNOSIS — I509 Heart failure, unspecified: Secondary | ICD-10-CM | POA: Diagnosis not present

## 2023-10-20 ENCOUNTER — Other Ambulatory Visit: Payer: Self-pay | Admitting: Cardiology

## 2023-10-20 DIAGNOSIS — I4891 Unspecified atrial fibrillation: Secondary | ICD-10-CM

## 2023-10-22 NOTE — Telephone Encounter (Addendum)
 Warfarin 5mg  refill Afib Last INR 07/19/23 & was due 08/13/23 PT NEEDS AN APPT Last OV 08/27/23  Called pt/dtr and there was no answer so left a to call back at [phone] or [phone] to set an appt.  Spoke with pt's dtr and set up appointment for 5/9 at 345pm, gave new location address. Also, states she has enough medication until appointment.

## 2023-10-25 DIAGNOSIS — I509 Heart failure, unspecified: Secondary | ICD-10-CM | POA: Diagnosis not present

## 2023-10-29 ENCOUNTER — Other Ambulatory Visit: Payer: Self-pay

## 2023-11-01 ENCOUNTER — Other Ambulatory Visit: Payer: Self-pay

## 2023-11-01 ENCOUNTER — Other Ambulatory Visit (HOSPITAL_COMMUNITY): Payer: Self-pay | Admitting: Cardiology

## 2023-11-01 NOTE — Progress Notes (Signed)
 Specialty Pharmacy Refill Coordination Note  [name] is a 83 y.o. female, patients daughter Vicki [name] was contacted today regarding refills of specialty medication(s) Tafamidis  (Vyndamax )   Patient requested Delivery   Delivery date: 11/04/23   Verified address: [address]  [place] [address]   Medication will be filled on 11/03/23.   This fill date is pending response to refill request from provider. Patient is aware and if they have not received fill by intended date they must follow up with pharmacy.

## 2023-11-02 ENCOUNTER — Other Ambulatory Visit (HOSPITAL_COMMUNITY): Payer: Self-pay | Admitting: Cardiology

## 2023-11-02 ENCOUNTER — Other Ambulatory Visit: Payer: Self-pay

## 2023-11-03 ENCOUNTER — Other Ambulatory Visit: Payer: Self-pay

## 2023-11-03 ENCOUNTER — Other Ambulatory Visit (HOSPITAL_COMMUNITY): Payer: Self-pay

## 2023-11-03 MED ORDER — VYNDAMAX 61 MG PO CAPS
1.0000 | ORAL_CAPSULE | Freq: Every day | ORAL | 3 refills | Status: AC
  Filled 2023-11-03: qty 90, 90d supply, fill #0
  Filled 2024-01-26: qty 90, 90d supply, fill #1
  Filled 2024-04-19 – 2024-05-05 (×4): qty 90, 90d supply, fill #2
  Filled 2024-07-28 – 2024-08-02 (×2): qty 90, 90d supply, fill #3

## 2023-11-05 ENCOUNTER — Ambulatory Visit: Attending: Cardiology

## 2023-11-05 DIAGNOSIS — Z5181 Encounter for therapeutic drug level monitoring: Secondary | ICD-10-CM | POA: Diagnosis not present

## 2023-11-05 LAB — POCT INR: INR: 1.4 — AB (ref 2.0–3.0)

## 2023-11-05 NOTE — Patient Instructions (Signed)
 Take another 1 tablet today only then Increase to 1 tablet daily.    Recheck INR in 1 week.  Coumadin  Clinic [phone]

## 2023-11-12 ENCOUNTER — Ambulatory Visit: Attending: Cardiology

## 2023-11-12 DIAGNOSIS — Z5181 Encounter for therapeutic drug level monitoring: Secondary | ICD-10-CM | POA: Diagnosis not present

## 2023-11-12 LAB — POCT INR: INR: 1.9 — AB (ref 2.0–3.0)

## 2023-11-12 NOTE — Patient Instructions (Signed)
 START Taking 1 tablet daily, except 1.5 tablets every Wednesday.    Recheck INR in 3 weeks  Coumadin  Clinic [phone]

## 2023-11-24 DIAGNOSIS — I509 Heart failure, unspecified: Secondary | ICD-10-CM | POA: Diagnosis not present

## 2023-11-25 ENCOUNTER — Other Ambulatory Visit: Payer: Self-pay

## 2023-11-25 DIAGNOSIS — I4891 Unspecified atrial fibrillation: Secondary | ICD-10-CM

## 2023-11-25 MED ORDER — WARFARIN SODIUM 5 MG PO TABS
ORAL_TABLET | ORAL | 0 refills | Status: DC
Start: 2023-11-25 — End: 2023-12-27

## 2023-11-25 NOTE — Telephone Encounter (Signed)
 Prescription refill request received for warfarin Lov: 08/27/23 Mitzie Anda)  Next INR check: 12/03/23 Warfarin tablet strength: 5mg   Appropriate dose. Refill sent.

## 2023-11-26 ENCOUNTER — Ambulatory Visit (HOSPITAL_COMMUNITY): Payer: Self-pay | Admitting: Cardiology

## 2023-11-26 ENCOUNTER — Encounter (HOSPITAL_COMMUNITY): Payer: Self-pay

## 2023-11-26 ENCOUNTER — Ambulatory Visit (HOSPITAL_COMMUNITY)
Admission: RE | Admit: 2023-11-26 | Discharge: 2023-11-26 | Disposition: A | Discharge status: Home / Self Care | Payer: Medicare HMO | Source: Ambulatory Visit | Attending: Cardiology | Admitting: Cardiology

## 2023-11-26 VITALS — BP 104/60 | HR 63 | Wt 130.2 lb

## 2023-11-26 DIAGNOSIS — R0609 Other forms of dyspnea: Secondary | ICD-10-CM | POA: Insufficient documentation

## 2023-11-26 DIAGNOSIS — Z9981 Dependence on supplemental oxygen: Secondary | ICD-10-CM | POA: Insufficient documentation

## 2023-11-26 DIAGNOSIS — I340 Nonrheumatic mitral (valve) insufficiency: Secondary | ICD-10-CM | POA: Diagnosis not present

## 2023-11-26 DIAGNOSIS — I5032 Chronic diastolic (congestive) heart failure: Secondary | ICD-10-CM | POA: Insufficient documentation

## 2023-11-26 DIAGNOSIS — E785 Hyperlipidemia, unspecified: Secondary | ICD-10-CM | POA: Insufficient documentation

## 2023-11-26 DIAGNOSIS — I110 Hypertensive heart disease with heart failure: Secondary | ICD-10-CM | POA: Diagnosis not present

## 2023-11-26 DIAGNOSIS — R350 Frequency of micturition: Secondary | ICD-10-CM | POA: Insufficient documentation

## 2023-11-26 DIAGNOSIS — I272 Pulmonary hypertension, unspecified: Secondary | ICD-10-CM | POA: Diagnosis not present

## 2023-11-26 DIAGNOSIS — Z79899 Other long term (current) drug therapy: Secondary | ICD-10-CM | POA: Diagnosis not present

## 2023-11-26 DIAGNOSIS — I482 Chronic atrial fibrillation, unspecified: Secondary | ICD-10-CM | POA: Diagnosis not present

## 2023-11-26 DIAGNOSIS — R32 Unspecified urinary incontinence: Secondary | ICD-10-CM | POA: Insufficient documentation

## 2023-11-26 DIAGNOSIS — Z7901 Long term (current) use of anticoagulants: Secondary | ICD-10-CM | POA: Diagnosis not present

## 2023-11-26 LAB — CBC
HCT: 42.1 % (ref 36.0–46.0)
Hemoglobin: 13.1 g/dL (ref 12.0–15.0)
MCH: 31.8 pg (ref 26.0–34.0)
MCHC: 31.1 g/dL (ref 30.0–36.0)
MCV: 102.2 fL — ABNORMAL HIGH (ref 80.0–100.0)
Platelets: 120 10*3/uL — ABNORMAL LOW (ref 150–400)
RBC: 4.12 MIL/uL (ref 3.87–5.11)
RDW: 13.2 % (ref 11.5–15.5)
WBC: 4.6 10*3/uL (ref 4.0–10.5)
nRBC: 0 % (ref 0.0–0.2)

## 2023-11-26 LAB — COMPREHENSIVE METABOLIC PANEL WITH GFR
ALT: 11 U/L (ref 0–44)
AST: 26 U/L (ref 15–41)
Albumin: 3.9 g/dL (ref 3.5–5.0)
Alkaline Phosphatase: 30 U/L — ABNORMAL LOW (ref 38–126)
Anion gap: 8 (ref 5–15)
BUN: 30 mg/dL — ABNORMAL HIGH (ref 8–23)
CO2: 30 mmol/L (ref 22–32)
Calcium: 9.3 mg/dL (ref 8.9–10.3)
Chloride: 102 mmol/L (ref 98–111)
Creatinine, Ser: 1.56 mg/dL — ABNORMAL HIGH (ref 0.44–1.00)
GFR, Estimated: 33 mL/min — ABNORMAL LOW (ref >60)
Glucose, Bld: 116 mg/dL — ABNORMAL HIGH (ref 70–99)
Potassium: 4 mmol/L (ref 3.5–5.1)
Sodium: 140 mmol/L (ref 135–145)
Total Bilirubin: 0.9 mg/dL (ref 0.0–1.2)
Total Protein: 7.3 g/dL (ref 6.5–8.1)

## 2023-11-26 LAB — BRAIN NATRIURETIC PEPTIDE: B Natriuretic Peptide: 607.1 pg/mL — ABNORMAL HIGH (ref 0.0–100.0)

## 2023-11-26 MED ORDER — TORSEMIDE 20 MG PO TABS: 20.0000 mg | ORAL_TABLET | Freq: Every day | ORAL | Status: DC

## 2023-11-26 MED ORDER — POTASSIUM CHLORIDE CRYS ER 20 MEQ PO TBCR: 20.0000 meq | EXTENDED_RELEASE_TABLET | Freq: Two times a day (BID) | ORAL | Status: AC

## 2023-11-26 NOTE — Patient Instructions (Signed)
 DECREASE  Torsemide  to 20 mg daily. You may take an extra 20 mg if needed for weight gain of 3 lb in 24 hours or 5 lbs in a week.  Labs done today, your results will be available in MyChart, we will contact you for abnormal readings.  Your physician recommends that you schedule a follow-up appointment in: 3 months ( August) ** PLEASE CALL THE OFFICE IN MID JUNE TO ARRANGE YOUR FOLLOW UP APPOINTMENT.**  If you have any questions or concerns before your next appointment please send us  a message through [place] or call our office at [phone].    TO LEAVE A MESSAGE FOR THE NURSE SELECT OPTION 2, PLEASE LEAVE A MESSAGE INCLUDING: YOUR NAME DATE OF BIRTH CALL BACK NUMBER REASON FOR CALL**this is important as we prioritize the call backs  YOU WILL RECEIVE A CALL BACK THE SAME DAY AS LONG AS YOU CALL BEFORE 4:00 PM  At the [facility], you and your health needs are our priority. As part of our continuing mission to provide you with exceptional heart care, we have created designated Provider Care Teams. These Care Teams include your primary Cardiologist (physician) and Advanced Practice Providers (APPs- Physician Assistants and Nurse Practitioners) who all work together to provide you with the care you need, when you need it.   You may see any of the following providers on your designated Care Team at your next follow up: Dr Jules Oar Dr Peder Bourdon Dr. Alwin Baars Dr. Arta Lark Amy Marijane Shoulders, NP Ruddy Corral, [place] [facility] [place], [place] Dennise Fitz, NP [place] Lee, NP Shawnee Dellen, NP Luster Salters, PharmD Bevely Brush, PharmD   Please be sure to bring in all your medications bottles to every appointment.    Thank you for choosing [facility]

## 2023-11-26 NOTE — Progress Notes (Signed)
 Date:  11/26/2023   ID:  Vicki [name], DOB 1940-11-04, MRN [id number]   Provider location: [facility] Type of Visit: Established patient   PCP:  Imelda Man, MD  HF Cardiologist:  Dr. Mitzie Anda  Chief complaint: f/u for HFpEF and PH    HPI: Vicki [name] is a 83 y.o. female who has a history of chronic diastolic CHF, pulmonary hypertension and chronic atrial fibrillation.  Patient was followed by Dr. Azalea Lento in the past for chronic atrial fibrillation.  She has been on coumadin .  She reports progressive exertional dyspnea since 2011.  This gradually worsened and became quite significant over the last few months.  She used to have significant HTN, but more recently her BP has been on the lower side.  Echo was done in 2/14, showing severe concentric LVH with EF 55-60%, moderately dilated RV, moderate to severe TR, and PA systolic pressure 86 mmHg.  I did a right heart cath in 5/14.  This showed PA pressure 104/36 with PCWP 20, suggesting pulmonary arterial HTN well out of proportion to the mildly elevate wedge pressure.  She was already on amlodipine  so I did not do vasodilator testing.  V/Q scan was done, showing no evidence for chronic PEs.  PFTs showed a restrictive defect. Cardiac MRI did not show definite evidence for amyloid.  I started her on macitentan  10 mg daily.  Initially, she felt better on macitentan .  However, she was admitted in 5/14 from her sleep study due to orthopnea and dyspnea.  She was diuresed for several days and diuretic was switched over to torsemide .  I next started her on tadalafil  20 mg daily and titrated up to 40 mg daily.  She thinks that this helped.  She saw pulmonary after chest CT (showed mosaic attenuation in lungs).  This was thought to be due to air-trapping rather than ILD.  She was started on Spiriva . She wears oxygen  at home.    She had an echocardiogram in 2/15 that showed severe LVH, EF 75%, small pericardial effusion, mildly dilated  RV with mildly decreased systolic function, PA systolic pressure 84 mmHg.  She is not totally sure if she was taking macitentan  and tadalafil  at that time.  She has had a hard month.  She ran out of her macitentan  and tadalafil  and her insurance company refused to refill them.  After this, she took a decided turn for the worse.  [place] in the fall.  She became much more short of breath, just with walking around the house. She developed lightheaded spells, especially with micturation.  Given lightheadedness and presyncope, she was admitted. She was restarted on her medications and she finally got back on her meds at home.  In the hospital, she was noted to be bradycardic with HR to 30s so metoprolol  was stopped.  Of note, her abdominal fat pad biopsy did not suggest amyloidosis. Repeat RHC in 3/15 still with moderate to severe PAH and low CI.  Holter off beta blocker in 3/15 showed average HR 73.    Echo in 9/17 showed EF 65-70% with moderate to severe LVH, moderate MR, normal RV size with mildly decreased systolic function, PASP 56 mmHg, small pericardial effusion.    Echo in 10/18 showed EF 60-65% with moderate LVH, moderate MR, severe biatrial enlargement, PASP 62 mmHg, RV normal.  PYP scan in 10/18 was not suggestive of  TTR amyloidosis.   Echo in 10/19 showed EF 65-70%, moderate MR, normal RV size and systolic function, PASP 43 mmHg.   Echo in 12/20 showed EF 65-70%, moderate LVH, severe biatrial enlargement, mild-moderate MR, small pericardial effusion, unable to estimate PA systolic pressure.   PYP scan in 12/20 was visually grade 2 with H/CL 1.96 but activity appeared localized to the atria. Inv[employer]ae gene testing for TTR amyloidosis in 3/21 was negative.   Echo in 2/22 showed EF 60-65%, moderate LVH, normal RV, PASP 37 mmHg, moderate central MR.  PYP scan repeated 9/22 was grade 2 with H/CL 1.28 => isolated atrial uptake  again.  Echo in 5/23 showed EF 60-65%, severe concentric LVH, normal RV, PASP 49 mmHg, moderate MR, small pericardial effusion.   Admitted 05/14/22 with sepsis in the setting of flu and pneumonia. Placed on Tamiflu  and antibiotics. AHF consulted to help with HF/PH meds due to AKI. Discharged home, weight 137 lbs.  Echo in 10/24 showed EF 60-65%, moderate LVH, RV normal, severe biatrial enlargement, small-moderate pericardial effusion, moderate MR, normal IVC, PASP 45 mmHg.   She presents to clinic today for f/u. Here w/ her daughter. Has mild dementia. Daughter helps w/ history. She has been getting around fairly well. Able to ambulate w/o assistive devices. No exertional dyspnea w/ ALDs. Also lives in 2 story home. Denies exertional dyspnea ambulating up the stairs. Daughter also confirms that she appears to be getting around w/o appearing extremely winded.   BP well controlled, 104/60. No orthostatic symptoms. Denies gross bleeding. INRs followed by coumadin  clinic.   She goes to adult daycare during the day. Lives w/ her son. He cooks for her. He is vegan. Cooks healthy meals/low sodium.   Main complaint/concern is frequent urination w/ current dose of torsemide  and urinary incontinence. Has to wear depends. This is bothersome for her. Requires frequent changing.   She has scale at home and weighs daily. Home wts have been stable.   ECG: not performed   6 minute walk (5/14): 122 m.   6 minute walk (7/14): 152 m 6 minute walk (10/14): 183 m 6 minute walk (4/15): 317 m 6 minute walk (12/15): 259 m 6 minute walk (4/16): 293 m 6 minute walk (8/16): 341 m 6 minute walk (12/16): 274 m 6 minute walk (6/17): 314 m 6 minute walk (9/17): 307 m 6 minute walk (4/18): 366 m 6 minute walk (10/18): 320 m 6 minute walk (3/19): 381 m 6 minute walk (9/19): 366 m 6 minute walk (9/20): 305 m 6 minute walk (12/20): 243 m 6 minute walk (6/21): 304 m 6 minute walk (11/21): 323 m 6 minute walk  (2/22): 305 m 6 minute walk (5/22): 366 m 6 minute walk (1/23): 366 m 6 minute walk (5/23): 314 m   Labs (12/13): HCT 45.1, plts 153, K 3.5, creatinine 1.0 Labs (1/14): BNP 849 Labs (4/14): K 3.1, creatinine 1.0, ANA and anti-SCL-70 antibody negative.  Rheumatoid factor negative.  Serum immunofixation did not show monoclonal light chains.  Labs (5/14): K 3.3, creatinine 0.79, proBNP 5147 Labs (6/14): K 4, creatinine 0.9, BNP 1001 Labs (10/14): LDL 53, LDL-P 975, K 3.7, creatinine 1.2 Labs (11/14): K 3.7, creatinine 0.9, BNP 832 Labs (2/15): K 3.5, creatinine 1.10, HCT 42.6, UPEP negative, SPEP negative.  Labs (3/15): K 3.8, creatinine 0.88 Labs (4/15): K 3.7, creatinine 0.99, proBNP 1653 Labs (7/15): K 4, creatinine 0.93 Labs (12/15): LDL 77, HDL 58, K 3.9, creatinine 8.29, LFTs normal Labs (4/16):  K 3.8, creatinine 0.93, BNP 475, plts 105, HCT 42.3 Labs (11/16): K 3.6, creatinine 1.1, HCT 42.2 Labs (12/16): LDL 54, HDL 50, BNP 628 Labs (6/17): K 3.7, creatinine 0.97, HCT 41.3, BNP 489 Labs (9/17): K 3.8, creatinine 0.97, LDL 65, HDL 54 Labs (9/18): K 4.1, creatinine 1.05, hgb 14.6, BNP 646 Labs (10/18): BNP 588, TSH normal, LDL 73, HDL 48, K 3.5, creatinine 2.13, hgb 12.5, plts 91, LFTs normal Labs (3/19): K 4.1, creatinine 1.07, hgb 13.2 Labs (9/19): K 3.8, creatinine 1.1 Labs (7/20): K 3.9, creatinine 1.3, LDL 63 Labs (9/20): K 4.2, creatinine 1.05, hgb 12.6, BNP 632  Labs (12/20): Myeloma panel negative, urine immunofixation negative.  Labs (1/21): K 4.3, creatinine 1.12, LDL 63 Labs (3/21): K 3.8, creatinine 1.13 Labs (6/21): K 3.9, creatinine 1.16 Labs (11/21): LDL 63, K 4.1, creatinine 0.86 Labs (2/22): K 4.3, creatinine 1.14, hgb 11.5, BNP 915 Labs (5/22): LDL 65 Labs (6/22): K 4, creatinine 1.16 Labs (8/22): K 3.6, creatinine 1.05, BNP 720 Labs (2/23): K 3.8, creatinine 1.26, LDL 62, BNP 623 Labs (5/23): K 3.5, creatinine 1.18, BNP 477 Labs (11/23): K 4.3, creatinine  1.22 Labs (07/26/22): K 3.6 Creatinine 1.46, hgb 11.6 Labs (7/24): K 4.5, creatinine 1.53, hgb 12.3   PMH: 1. Chronic diastolic CHF: Echo (2/14) with EF 55-60%, severe LVH (no SAM, no asymmetric hypertrophy, no LVOT gradient), moderate-severe LAE, moderately dilated RV with mildly decreased systolic function, moderate to severe RAE, PA systolic pressure 86 mmHg, moderate-severe TR, moderate MR, trivial pericardial effusion.  Cardiac MRI (5/14): EF 65%, severe LVH, no definite evidence for amyloidosis (no delayed enhancement, myocardium not difficult to null).  Echo (2/15) with EF 75%, severe LVH, grade II diastolic dysfunction, moderate MR, RV mildly dilated with mildly decreased systolic function, moderate TR, PA systolic pressure 84 mmHg, small pericardial effusion. Abdominal fat pad biopsy (2/15) showed no evidence for amyloidosis.  SPEP/UPEP negative 2/15.  - Echo (4/16) with EF 60-65%, severe LVH, RV mildly dilated with mildly decreased systolic function, PA systolic pressure 40 mmHg. - Echo (9/17) with EF 65-70%, severe LVH, moderate MR, normal RV size with mildly decreased systolic function, PASP 56 mmHg, small pericardial effusion.  - Echo (10/18): EF 60-65% with moderate LVH, moderate MR, severe biatrial enlargement, PASP 62 mmHg, RV normal. - PYP scan (10/18) was not suggestive of TTR amyloidosis.  - Echo (10/19): EF 65-70%, moderate MR, normal RV size and systolic function, PASP 43 mmHg. - Echo (12/20): EF 65-70%, moderate LVH, severe biatrial enlargement, mild-moderate MR, small pericardial effusion, unable to estimate PA systolic pressure.  - PYP scan (12/20): grade 2 with H/CL 1.96 but activity localized to the atria.  - [employer]ae gene testing for TTR amyloidosis in 3/21 was negative.  - Echo (2/22): EF 60-65%, moderate LVH, normal RV, PASP 37 mmHg, moderate central MR. - PYP scan (9/22): grade 2, H/CL 1.28 => isolated atrial uptake.  - Echo (5/23): EF 60-65%, severe concentric LVH, normal  RV, PASP 49 mmHg, moderate MR, small pericardial effusion.  - Echo (10/24): EF 60-65%, moderate LVH, RV normal, severe biatrial enlargement, small-moderate pericardial effusion, moderate MR, normal IVC, PASP 45 mmHg.  2. Chronic atrial fibrillation since around 2004.  Developed bradycardia and metoprolol  stopped 2/15. Holter (3/15) with average HR 73, atrial fibrillation, 3.8 sec pause x 1 while asleep, PVCs.  3. HTN: For decades.  4. LHC (2/08) with no significant disease.  5. Chronic thrombocytopenia: ITP 6. Pulmonary arterial HTN: RHC (5/14) with mean RA  13, PA 104/36 (mean 63), mean PCWP 20 on right and 23 on left, CI 2.3 (Fick) and 1.6 (thermo), PVR 10.4 WU (Fick) and 15 WU (thermo).  Vasodilator testing not done as patient was already on amlodipine .  V/Q scan (5/14) with no evidence for chronic PE.  ANA, RF, and anti-SCL70 antibody negative.  PFTs (5/14) with FEV1 60%, FVC 54%, ratio 112%, TLC 61%, DLCO 43% => restrictive defect.  Sleep study (7/14) with no OSA.  CT chest with areas of mosaic attenuation in lungs (saw pulmonary, thought air trapping and not ILD). RHC (3/15) with RA mean 5, PA 66/31 mean 43, PCWP mean 11, Cardiac Index (Fick) 1.97, PVR 9.2 WU.  Patient was started on Tyvaso  in 3/15.  Echo (4/16) with mildly dilated and mildly dysfunctional RV, PA systolic pressure 40 mmHg.  7. Chest pain: Cardiolite 11/21 with no ischemia.  8. Dementia: MRI brain 3/24 with no CVA, chronic ischemic changes.   Current Outpatient Medications  Medication Sig Dispense Refill   acetaminophen  (TYLENOL ) 500 MG tablet Take 500 mg by mouth every 6 (six) hours as needed for mild pain or headache.      Cholecalciferol  (VITAMIN D3) 2000 units TABS Take 1 tablet by mouth daily.      cyanocobalamin  (VITAMIN B12) 1000 MCG tablet Take 1 tablet (1,000 mcg total) by mouth daily. 30 tablet 0   OPSUMIT  10 MG tablet TAKE 1 TABLET (10MG ) BY MOUTH DAILY WITH BREAKFAST 30 tablet 6   rosuvastatin  (CRESTOR ) 20 MG tablet  Take 20 mg by mouth at bedtime.      Selexipag  (UPTRAVI ) 600 MCG TABS Take 600 mcg by mouth in the morning and at bedtime. 180 tablet 3   SENNA PO Take by mouth as needed.     spironolactone  (ALDACTONE ) 25 MG tablet TAKE 1/2 TABLET BY MOUTH ONCE DAILY 15 tablet 10   tadalafil , PAH, (ADCIRCA ) 20 MG tablet TAKE 2 TABLETS DAILY (Generic for Adcirca ) 60 tablet 11   Tafamidis  (VYNDAMAX ) 61 MG CAPS Take 1 capsule by mouth daily. 90 capsule 3   warfarin (COUMADIN ) 5 MG tablet TAKE 1 TO 1 1/2 TABLETS BY MOUTH BY MOUTH DAILY AS DIRECTED BY COUMADIN  CLINIC 105 tablet 0   potassium chloride  SA (KLOR-CON  M20) 20 MEQ tablet Take 1 tablet (20 mEq total) by mouth 2 (two) times daily.     torsemide  (DEMADEX ) 20 MG tablet Take 1 tablet (20 mg total) by mouth daily. May take an extra 20 mg if needed     No current facility-administered medications for this encounter.    Allergies:   Patient has no known allergies.   Social History:  The patient  reports that she has never smoked. She has never used smokeless tobacco. She reports that she does not currently use alcohol. She reports that she does not use drugs.   Family History:  The patient's family history includes Alzheimer's disease in her mother; Heart Problems in her brother and maternal aunt; Heart attack in her father; Hypertension in her brother; Pulmonary Hypertension in her cousin; Thyroid  disease in her daughter, daughter, and maternal aunt.   ROS:  Please see the history of present illness.   All other systems are personally reviewed and negative.   Recent Labs: 08/27/2023: B Natriuretic Peptide 717.7; BUN 30; Creatinine, Ser 1.66; Hemoglobin 12.4; Platelets 95; Potassium 4.2; Sodium 140  Personally reviewed   BP 104/60   Pulse 63   Wt 59.1 kg (130 lb 3.2 oz)   SpO2 98%  BMI 23.06 kg/m   Wt Readings from Last 3 Encounters:  11/26/23 59.1 kg (130 lb 3.2 oz)  08/27/23 58.1 kg (128 lb)  07/05/23 59.9 kg (132 lb)   PHYSICAL EXAM: General:   Well appearing, elderly. No respiratory difficulty HEENT: normal Neck: supple. No JVD. Carotids 2+ bilat; no bruits. No lymphadenopathy or thyromegaly appreciated. Cor:  Irregularly irregular rhythm and rate. 3/6 MR murmur  Lungs: clear Abdomen: soft, nontender, nondistended. No hepatosplenomegaly. No bruits or masses. Good bowel sounds. Extremities: no cyanosis, clubbing, rash, edema Neuro: alert & oriented x 3, cranial nerves grossly intact. moves all 4 extremities w/o difficulty. Affect pleasant.   Assessment & Plan 1. Pulmonary HTN: Patient has severe pulmonary arterial HTN.  RHC in 3/15 showed CI 1.97, PVR 9. 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 Collagen vascular disease workup was negative (negative RF, ANA and negative anti-SCL-70).  V/Q scan was not suggestive of chronic PEs.  PFTs were suggestive of restrictive lung disease but CT chest and evaluation by pulmonary did not suggest interstitial lung disease.  Sleep study did not show OSA.  Echo in 3/22 showed normal RV size and systolic function, PASP estimation 37 mmHg.  Echo 5/23 showed EF 60-65%, severe concentric LVH, normal RV, PASP 49 mmHg, moderate MR, small pericardial effusion. Echo in 10/24 showed EF 60-65%, moderate LVH, RV normal, severe biatrial enlargement, small-moderate pericardial effusion, moderate MR, normal IVC, PASP 45 mmHg.  - Continue macitentan  and Adcirca . - Continue Uptravi  600 mcg bid, she cannot increase further due to side effects.  2. Chronic diastolic CHF: EF preserved on echo with moderate concentric LVH and a small pericardial effusion.  It is possible that the LVH is due to years of HTN.  The cardiac MRI was not definitively suggestive of cardiac amyloidosis.  I was still concerned for amyloidosis given the appearance of the LV myocardium on echoes.  Abdominal fat pad biopsy in 2015 showed no evidence for amyloidosis. She has some symptoms suggestive of mild peripheral neuropathy.  PYP scan in  10/18 was not suggestive of TTR amyloidosis. I assessed her again for cardiac amyloidosis in 2020: myeloma panel and urine immunofixation negative, PYP scan in 12/20 was grade 2 with H/CL 1.97 but activity was localized to the atria.  [employer]ae gene testing for TTR amyloidosis was negative.  PYP scan repeated 9/22 was grade 2 with H/CL 1.28 => isolated atrial uptake again.  Echo in 5/23 was consistent with cardiac amyloidosis, EF 60-65%, severe concentric LVH, normal RV, PASP 49 mmHg, moderate MR, small pericardial effusion. Echo in 10/24 showed EF 60-65%, moderate LVH, RV normal, severe biatrial enlargement, small-moderate pericardial effusion, moderate MR, normal IVC, PASP 45 mmHg.  She appears euvolemic on exam w/ stable NYHA Class II symptoms.  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 This may be an earlier manifestation before LV involvement. This can be associated with atrial fibrillation. She will continue tafamidis .   - given issues w/ incontinence/ frequent urination, will try scaling down on torsemide  to 20 mg daily and instructed to take an extra 20 mg PRN for wt gain > 3 lb in 24 hr or > 5 lb in 1 wk. Also reduce KCL to 20 mEq bid  - Continue Spironolactone  12.5 mg daily  - Check CMP and BNP today  3. HTN: controlled. Soft but no orthostatic symptoms - continue current regimen  4. Chronic atrial fibrillation:  Rate controlled. She is off metoprolol  due to bradycardia. Pulse rate  in the 60s. Denies syncope/ near syncope. On Coumadin . INR followed in coumadin  clinic. Denies abnormal bleeding. Check CBC today  5. Hyperlipidemia: on Crestor  20 mg daily  - check LP and HFTs today   W/u in 3 months w/ Dr. Jannette Mend, PA-C   11/26/2023  [facility] [address] [facility] [place] [address] [phone] (office) [phone] (fax)

## 2023-12-03 ENCOUNTER — Ambulatory Visit: Attending: Cardiology | Admitting: *Deleted

## 2023-12-03 DIAGNOSIS — Z5181 Encounter for therapeutic drug level monitoring: Secondary | ICD-10-CM | POA: Diagnosis not present

## 2023-12-03 DIAGNOSIS — I4891 Unspecified atrial fibrillation: Secondary | ICD-10-CM

## 2023-12-03 LAB — POCT INR: INR: 3.3 — AB (ref 2.0–3.0)

## 2023-12-03 NOTE — Patient Instructions (Signed)
 Description   Do not take any warfarin today if you have already taken today's dose then do not take any warfarin tomorrow, then continue taking 1 tablet daily, except 1.5 tablets every Wednesday.   Recheck INR in 3 weeks  Coumadin  Clinic [phone]

## 2023-12-08 ENCOUNTER — Telehealth (HOSPITAL_COMMUNITY): Payer: Self-pay | Admitting: Cardiology

## 2023-12-08 NOTE — Telephone Encounter (Signed)
 Patients daughter called to report pt went back to previous dose of torsemide   Reports after change, noticed BLE Restarted torsemide  30mg  am 10mg  PM   Just notifying provider as instructed

## 2023-12-08 NOTE — Telephone Encounter (Signed)
 Noted. If easier, could just try taking 40 mg once a day.

## 2023-12-09 NOTE — Telephone Encounter (Signed)
 Daughter aware.

## 2023-12-14 ENCOUNTER — Other Ambulatory Visit (HOSPITAL_COMMUNITY): Payer: Self-pay | Admitting: Cardiology

## 2023-12-14 DIAGNOSIS — I272 Pulmonary hypertension, unspecified: Secondary | ICD-10-CM

## 2023-12-21 ENCOUNTER — Telehealth (HOSPITAL_COMMUNITY): Payer: Self-pay | Admitting: Pharmacy Technician

## 2023-12-21 NOTE — Telephone Encounter (Signed)
 [facility] Patient Advocate Encounter  [employer] requested a diagnosis verification form for the patients Pulmonary Hypertension grant. This document was uploaded to the patients portal account.  Vicki [name], CPhT

## 2023-12-24 ENCOUNTER — Ambulatory Visit: Attending: Cardiology

## 2023-12-24 DIAGNOSIS — Z5181 Encounter for therapeutic drug level monitoring: Secondary | ICD-10-CM

## 2023-12-24 LAB — POCT INR: INR: 2.6 (ref 2.0–3.0)

## 2023-12-24 NOTE — Progress Notes (Signed)
Please see anticoagulation encounter.

## 2023-12-24 NOTE — Patient Instructions (Signed)
 Description   Continue taking 1 tablet daily, except 1.5 tablets every Wednesday.  Recheck INR in 4 weeks  Coumadin  Clinic [phone]

## 2023-12-25 DIAGNOSIS — I509 Heart failure, unspecified: Secondary | ICD-10-CM | POA: Diagnosis not present

## 2023-12-26 ENCOUNTER — Other Ambulatory Visit: Payer: Self-pay | Admitting: Cardiology

## 2023-12-26 DIAGNOSIS — I4891 Unspecified atrial fibrillation: Secondary | ICD-10-CM

## 2023-12-27 ENCOUNTER — Other Ambulatory Visit (HOSPITAL_COMMUNITY): Payer: Self-pay | Admitting: Pharmacist

## 2023-12-27 DIAGNOSIS — Z9861 Coronary angioplasty status: Secondary | ICD-10-CM

## 2023-12-27 DIAGNOSIS — I4891 Unspecified atrial fibrillation: Secondary | ICD-10-CM

## 2023-12-27 MED ORDER — OPSUMIT 10 MG PO TABS: ORAL_TABLET | ORAL | 11 refills | Status: AC

## 2023-12-27 NOTE — Telephone Encounter (Signed)
 Refill request for warfarin:  Last INR was 2.6 on 12/24/23 Next INR due 01/21/24 LOV was 11/26/23  Refill approved.

## 2024-01-03 DIAGNOSIS — E559 Vitamin D deficiency, unspecified: Secondary | ICD-10-CM | POA: Diagnosis not present

## 2024-01-03 DIAGNOSIS — I10 Essential (primary) hypertension: Secondary | ICD-10-CM | POA: Diagnosis not present

## 2024-01-06 DIAGNOSIS — Z0000 Encounter for general adult medical examination without abnormal findings: Secondary | ICD-10-CM | POA: Diagnosis not present

## 2024-01-06 DIAGNOSIS — D696 Thrombocytopenia, unspecified: Secondary | ICD-10-CM | POA: Diagnosis not present

## 2024-01-06 DIAGNOSIS — I50812 Chronic right heart failure: Secondary | ICD-10-CM | POA: Diagnosis not present

## 2024-01-06 DIAGNOSIS — I272 Pulmonary hypertension, unspecified: Secondary | ICD-10-CM | POA: Diagnosis not present

## 2024-01-06 DIAGNOSIS — I482 Chronic atrial fibrillation, unspecified: Secondary | ICD-10-CM | POA: Diagnosis not present

## 2024-01-06 DIAGNOSIS — I10 Essential (primary) hypertension: Secondary | ICD-10-CM | POA: Diagnosis not present

## 2024-01-06 DIAGNOSIS — Z9981 Dependence on supplemental oxygen: Secondary | ICD-10-CM | POA: Diagnosis not present

## 2024-01-06 DIAGNOSIS — N1832 Chronic kidney disease, stage 3b: Secondary | ICD-10-CM | POA: Diagnosis not present

## 2024-01-06 DIAGNOSIS — F02B0 Dementia in other diseases classified elsewhere, moderate, without behavioral disturbance, psychotic disturbance, mood disturbance, and anxiety: Secondary | ICD-10-CM | POA: Diagnosis not present

## 2024-01-06 DIAGNOSIS — D6869 Other thrombophilia: Secondary | ICD-10-CM | POA: Diagnosis not present

## 2024-01-06 DIAGNOSIS — I110 Hypertensive heart disease with heart failure: Secondary | ICD-10-CM | POA: Diagnosis not present

## 2024-01-06 DIAGNOSIS — G301 Alzheimer's disease with late onset: Secondary | ICD-10-CM | POA: Diagnosis not present

## 2024-01-17 ENCOUNTER — Other Ambulatory Visit (HOSPITAL_COMMUNITY): Payer: Self-pay | Admitting: Cardiology

## 2024-01-21 ENCOUNTER — Other Ambulatory Visit: Payer: Self-pay

## 2024-01-21 ENCOUNTER — Ambulatory Visit: Attending: Cardiology

## 2024-01-21 DIAGNOSIS — Z5181 Encounter for therapeutic drug level monitoring: Secondary | ICD-10-CM | POA: Diagnosis not present

## 2024-01-21 LAB — POCT INR: INR: 1.2 — AB (ref 2.0–3.0)

## 2024-01-21 NOTE — Progress Notes (Signed)
 INR 1.2; Please see anticoagulation encounter

## 2024-01-21 NOTE — Patient Instructions (Signed)
 Description   Take an extra tablet today and take 1.5 tablets tomorrow and then continue taking 1 tablet daily, except 1.5 tablets every Wednesday.  Recheck INR in 1 week.   Coumadin  Clinic [phone]

## 2024-01-24 DIAGNOSIS — I509 Heart failure, unspecified: Secondary | ICD-10-CM | POA: Diagnosis not present

## 2024-01-25 ENCOUNTER — Other Ambulatory Visit: Payer: Self-pay

## 2024-01-26 ENCOUNTER — Other Ambulatory Visit: Payer: Self-pay

## 2024-01-26 NOTE — Progress Notes (Signed)
 Specialty Pharmacy Refill Coordination Note  Vicki [name] is a 83 y.o. female contacted today regarding refills of specialty medication(s) Tafamidis  (Vyndamax )   Patient requested Delivery   Delivery date: 01/28/24   Verified address: [address] [place] [address]   Medication will be filled on 07.31.25.

## 2024-01-27 ENCOUNTER — Other Ambulatory Visit: Payer: Self-pay

## 2024-01-28 ENCOUNTER — Ambulatory Visit: Attending: Cardiology

## 2024-01-28 DIAGNOSIS — Z5181 Encounter for therapeutic drug level monitoring: Secondary | ICD-10-CM

## 2024-01-28 LAB — POCT INR: INR: 1.3 — AB (ref 2.0–3.0)

## 2024-01-28 NOTE — Progress Notes (Signed)
 INR 1.3; Please see anticoagulation encounter.

## 2024-01-28 NOTE — Patient Instructions (Signed)
 Increase to 1 tablet daily, except 1.5 tablets every Monday,  Wednesday and Friday. Recheck INR in 3 weeks.   Coumadin  Clinic [phone]

## 2024-02-17 ENCOUNTER — Other Ambulatory Visit (HOSPITAL_COMMUNITY): Payer: Self-pay | Admitting: Cardiology

## 2024-02-18 ENCOUNTER — Ambulatory Visit: Attending: Cardiology

## 2024-02-18 DIAGNOSIS — Z5181 Encounter for therapeutic drug level monitoring: Secondary | ICD-10-CM | POA: Diagnosis not present

## 2024-02-18 LAB — POCT INR: INR: 1.9 — AB (ref 2.0–3.0)

## 2024-02-18 NOTE — Progress Notes (Signed)
 Description   INR 1.9:Take 2 tablets today and then START 1 tablet daily, except 1.5 tablets every Sunday, Monday, Wednesday and Friday. Recheck INR in 3 weeks.   Coumadin  Clinic [phone]

## 2024-02-18 NOTE — Patient Instructions (Signed)
 Description   INR 1.9:Take 2 tablets today and then START 1 tablet daily, except 1.5 tablets every Sunday, Monday, Wednesday and Friday. Recheck INR in 3 weeks.   Coumadin  Clinic [phone]

## 2024-02-23 DIAGNOSIS — Z7901 Long term (current) use of anticoagulants: Secondary | ICD-10-CM | POA: Diagnosis not present

## 2024-02-23 DIAGNOSIS — G301 Alzheimer's disease with late onset: Secondary | ICD-10-CM | POA: Diagnosis not present

## 2024-02-23 DIAGNOSIS — I482 Chronic atrial fibrillation, unspecified: Secondary | ICD-10-CM | POA: Diagnosis not present

## 2024-02-24 DIAGNOSIS — I509 Heart failure, unspecified: Secondary | ICD-10-CM | POA: Diagnosis not present

## 2024-03-01 ENCOUNTER — Telehealth: Payer: Self-pay | Admitting: Adult Health

## 2024-03-01 NOTE — Telephone Encounter (Signed)
Patient called to verify appointment time and date. 

## 2024-03-06 ENCOUNTER — Encounter: Payer: Self-pay | Admitting: Adult Health

## 2024-03-06 ENCOUNTER — Ambulatory Visit: Payer: Medicare HMO | Admitting: Adult Health

## 2024-03-06 VITALS — BP 107/54 | HR 64 | Ht 64.0 in | Wt 131.4 lb

## 2024-03-06 DIAGNOSIS — F02C0 Dementia in other diseases classified elsewhere, severe, without behavioral disturbance, psychotic disturbance, mood disturbance, and anxiety: Secondary | ICD-10-CM

## 2024-03-06 DIAGNOSIS — G301 Alzheimer's disease with late onset: Secondary | ICD-10-CM

## 2024-03-06 DIAGNOSIS — F514 Sleep terrors [night terrors]: Secondary | ICD-10-CM | POA: Diagnosis not present

## 2024-03-06 NOTE — Patient Instructions (Signed)
 Your Plan:  Continue to monitor symptoms. Continue Namenda  as instructed by PCP Try melatonin 1 to 3 mg 1 hour before bedtime for night terrors if not beneficial please let us  know     Thank you for coming to see us  at [facility]. I hope we have been able to provide you high quality care today.  You may receive a patient satisfaction survey over the next few weeks. We would appreciate your feedback and comments so that we may continue to improve ourselves and the health of our patients.

## 2024-03-06 NOTE — Progress Notes (Signed)
 PATIENT: Vicki [name] DOB: 1941/01/02  REASON FOR VISIT: follow up HISTORY FROM: patient PRIMARY NEUROLOGIST: Dr. Buck  Chief Complaint  Patient presents with   Follow-up    Pt in 20 with daughter Pt states night terrors and daughter states short term memory is worse      HISTORY OF PRESENT ILLNESS: Today 03/06/24:  Vicki [name] is a 83 y.o. female with a history of memory disturbance. Returns today for follow-up.  She is here today with her daughter.  She lives with her son.  Daughter feels that her short-term memory is worse.  At home she is able to complete all ADLs independently.  Daughter helps her manage her medications, appointments and finances.  She does not do any household chores.  [facility] does [facility].  At the last visit we started Namenda .  Daughter reports that they did not start it but Dr. Clarice her PCP recently prescribed.  Daughter reports that she tends to have night terrors.  This started prior to her starting Namenda .    07/05/23: Vicki [name] is a 83 y.o. female who has been followed in this office for memory disturbance. Returns today for follow-up.  She is here today with her son.  She continues to live with her son.  She is no longer driving.  Able to complete all ADLs independently.  Family does assist her with laundry and put away her close.  Her son does all the cooking.  Her kids handle her finances.  No change in mood or behavior.  Denies hallucinations.  Son reports that PCP took her off of the Aricept because it was contributing to agitation.  He does state that agitation improved once the medication was discontinued.  She does go to a day center daily.  Returns today for an evaluation.    HISTORY 12/15/2022 (all dictated new, as well as above notes, some dictation done in note pad or Word, outside of chart, may appear as copied):    She reports feeling stable, her son also reports that for the most part things are stable, he and his  sister are in the process of enrolling her in a day program, [facility] in [place].  Her son has moved in with her.  She has had 1 episode of mood irritability around Mother's Day but overall is doing well, tolerates her medications, still takes donepezil 5 mg daily per PCP.  She hydrates well with water .  They had to stop giving her Ensure because it interferes with the   The patient's allergies, current medications, family history, past medical history, past social history, past surgical history and problem list were reviewed and updated as appropriate.      Previously:    09/01/22: (She) reports being forgetful for the past several months.  Daughter reports that she has had some recent auditory and visual hallucinations but this was since her hospitalization in November and she went back to the hospital in January 2024.  She had a recent follow-up with primary care and was started on donepezil 5 mg once daily at bedtime in January 2024.  She takes vitamin B12 by mouth.  She was staying with her daughter for about a month prior to her acute illness right before Thanksgiving.  When she came home from the hospital she was staying with her daughter but then eventually moved back into her home.  She has been living by herself since 2021.  She lost her husband in 2014 but someone stayed with her more or less consistently until 2021.  She quit driving in 7981 or 7980 as she was more confused while driving.  She reports that her mom had memory loss, daughter reports that patient's mom had Alzheimer's dementia and lived to be in her early 32s.  Patient's father died in his early 31s from heart attack.  Patient is the second oldest of a total of 9 siblings, all brothers have passed away.  She has 3 sisters alive including her oldest sister who is 40.  None of her siblings have or had memory loss.  Patient worked for [employer] of [place] for over 40 years.  She has 3 children.  Her daughter Olam  lives in [place], daughter Mitzie, who is here today lives locally and her son, AL, lives locally as well and is getting ready with his family to move into patient's home so she does not stay by herself. The patient is aware of her hallucinations recently and has good insight. Of note, she does not drink a whole lot of water , maybe 3 to 4 cups/day.  She does drink a little bit of soda, usually 1 bottle of Sprite, she drinks alcohol very rarely, maybe on special occasions.  She is a non-smoker.  She has not fallen recently but had to catch herself recently.  She does not use a walking aid typically.  REVIEW OF SYSTEMS: Out of a complete 14 system review of symptoms, the patient complains only of the following symptoms, and all other reviewed systems are negative.  ALLERGIES: No Known Allergies  HOME MEDICATIONS: Outpatient Medications Prior to Visit  Medication Sig Dispense Refill   acetaminophen  (TYLENOL ) 500 MG tablet Take 500 mg by mouth every 6 (six) hours as needed for mild pain or headache.      Cholecalciferol  (VITAMIN D3) 2000 units TABS Take 1 tablet by mouth daily.      cyanocobalamin  (VITAMIN B12) 1000 MCG tablet Take 1 tablet (1,000 mcg total) by mouth daily. 30 tablet 0   macitentan  (OPSUMIT ) 10 MG tablet TAKE 1 TABLET (10MG ) BY MOUTH DAILY WITH BREAKFAST 30 tablet 11   memantine  (NAMENDA ) 5 MG tablet Take 5 mg by mouth 2 (two) times daily.     potassium chloride  SA (KLOR-CON  M20) 20 MEQ tablet Take 1 tablet (20 mEq total) by mouth 2 (two) times daily.     rosuvastatin  (CRESTOR ) 20 MG tablet Take 20 mg by mouth at bedtime.      Selexipag  (UPTRAVI ) 600 MCG TABS Take 600 mcg by mouth in the morning and at bedtime. 180 tablet 3   SENNA PO Take by mouth as needed.     spironolactone  (ALDACTONE ) 25 MG tablet TAKE 1/2 TABLET BY MOUTH ONCE DAILY 15 tablet 10   tadalafil , PAH, (ADCIRCA ) 20 MG tablet TAKE 2 TABLETS DAILY (GENERIC FOR ADCIRCA ) 60 tablet 2   Tafamidis  (VYNDAMAX ) 61 MG CAPS  Take 1 capsule by mouth daily. 90 capsule 3   torsemide  (DEMADEX ) 20 MG tablet Take 1 tablet (20 mg total) by mouth daily. 90 tablet 11   warfarin (COUMADIN ) 5 MG tablet TAKE 1 TO 1 & 1/2 TABLETS BY MOUTH DAILY AS DIRECTED BY COUMADIN  CLINIC 105 tablet 1   No facility-administered medications prior to visit.    PAST MEDICAL HISTORY: Past Medical History:  Diagnosis Date   Arthritis    elbow   Atrial fibrillation (HCC)    Cancer (HCC)    Mylenoma-  top of head   CHF (congestive heart failure) (HCC)    Dysrhythmia    Heart murmur    On home oxygen  therapy    4 bliters   Pulmonary hypertension (HCC)    Seasonal allergies    Thrombocytopenia (HCC) 7/16    PAST SURGICAL HISTORY: Past Surgical History:  Procedure Laterality Date   BREAST LUMPECTOMY Right    benign   CARDIAC CATHETERIZATION  08/12/2006   no significant disease   CARDIAC CATHETERIZATION  10/28/2012   right heart cath done   DOPPLER ECHOCARDIOGRAPHY  Aug 09 2012   severe LVH  with mild cavity obliteration. EF 55-60% without normal relaxationbut difficult to really tell total diastolic function due to a fib; sclerotic aortic valve with no stenosis. mod mitral regurg. mod to severely dilated left atrium; mod dilated rgt right ventricle w/elevated pressures, estimated peak PA presures @86mmHG  TR jet calculation. RGT ATRIUM MOD to SEVERE,     NM MYOCAR PERF WALL MOTION  2004   EXERCISE  ,no ischemia   PARS PLANA VITRECTOMY Right 04/03/2015   Procedure: PARS PLANA VITRECTOMY WITH 25 GAUGE FOR VETREOUS HEMORRHAGE, ENDO LASER;  Surgeon: Onesimo Blanch, MD;  Location: [facility] OR;  Service: Ophthalmology;  Laterality: Right;   RIGHT HEART CATHETERIZATION N/A 09/07/2013   Procedure: RIGHT HEART CATH;  Surgeon: Ezra GORMAN Shuck, MD;  Location: [facility] CATH LAB;  Service: Cardiovascular;  Laterality: N/A;   VAGINAL HYSTERECTOMY      FAMILY HISTORY: Family History  Problem Relation Age of Onset   Heart Problems Brother    Hypertension  Brother    Alzheimer's disease Mother    Heart attack Father    Thyroid  disease Maternal Aunt    Pulmonary Hypertension Cousin    Heart Problems Maternal Aunt    Thyroid  disease Daughter    Thyroid  disease Daughter     SOCIAL HISTORY: Social History   Socioeconomic History   Marital status: Widowed    Spouse name: Not on file   Number of children: Not on file   Years of education: Not on file   Highest education level: Not on file  Occupational History   Occupation: retired  Tobacco Use   Smoking status: Never   Smokeless tobacco: Never  Substance and Sexual Activity   Alcohol use: Not Currently    Comment: occ   Drug use: No   Sexual activity: Not on file  Other Topics Concern   Not on file  Social History Narrative   Pt lives with son    Retired    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 Corporate investment banker Strain: Low Risk  (05/19/2022)   Overall Financial Resource Strain (CARDIA)    Difficulty of Paying Living Expenses: Not hard at all  Food Insecurity: No Food Insecurity (05/19/2022)   Hunger Vital Sign    Worried About Running Out of Food in the Last Year: Never true    Ran Out of Food in the Last Year: Never true  Transportation Needs: No Transportation Needs (05/25/2022)   PRAPARE - Administrator, Civil Service (Medical): No    Lack of Transportation (Non-Medical): No  Physical Activity: Not on file  Stress: Not on file  Social Connections: Not on file  Intimate Partner Violence: Not At Risk (05/19/2022)   Humiliation, Afraid, Rape, and Kick questionnaire    Fear of Current or Ex-Partner: No    Emotionally Abused: No    Physically Abused: No    Sexually Abused: No  PHYSICAL EXAM  Vitals:   03/06/24 1423  BP: (!) 107/54  Pulse: 64  Weight: 131 lb 6.4 oz (59.6 kg)  Height: 5' 4 (1.626 m)   Body mass index is 22.55 kg/m.     03/06/2024    2:25 PM 07/05/2023    2:39 PM 09/01/2022    1:58 PM  MMSE - Mini Mental State Exam  Orientation to  time 2 0 4  Orientation to Place 0 2 4  Registration 3 3 3   Attention/ Calculation 0 2 1  Recall 0 0 1  Language- name 2 objects 2 2 2   Language- repeat 0 1 1  Language- follow 3 step command 2 3 3   Language- read & follow direction 0 1 1  Write a sentence 0 1 1  Copy design 0 0 0  Total score 9 15 21      Generalized: Well developed, in no acute distress   Neurological examination  Mentation: Alert  Follows all commands speech and language fluent Cranial nerve II-XII: Pupils were equal round reactive to light. Extraocular movements were full, visual field were full on confrontational test. Facial sensation and strength were normal. Uvula tongue midline. Head turning and shoulder shrug  were normal and symmetric. Motor: The motor testing reveals 5 over 5 strength of all 4 extremities. Good symmetric motor tone is noted throughout.  Sensory: Sensory testing is intact to soft touch on all 4 extremities. No evidence of extinction is noted.  Coordination: Cerebellar testing reveals good finger-nose-finger and heel-to-shin bilaterally.  Gait and station: Gait is normal.  Reflexes: Deep tendon reflexes are symmetric and normal bilaterally.   DIAGNOSTIC DATA (LABS, IMAGING, TESTING) - I reviewed patient records, labs, notes, testing and imaging myself where available.  Lab Results  Component Value Date   WBC 4.6 11/26/2023   HGB 13.1 11/26/2023   HCT 42.1 11/26/2023   MCV 102.2 (H) 11/26/2023   PLT 120 (L) 11/26/2023      Component Value Date/Time   NA 140 11/26/2023 1541   NA 141 05/08/2015 1255   K 4.0 11/26/2023 1541   K 3.6 05/08/2015 1255   CL 102 11/26/2023 1541   CL 105 06/13/2012 1345   CO2 30 11/26/2023 1541   CO2 28 05/08/2015 1255   GLUCOSE 116 (H) 11/26/2023 1541   GLUCOSE 109 05/08/2015 1255   GLUCOSE 139 (H) 06/13/2012 1345   BUN 30 (H) 11/26/2023 1541   BUN 16.2 05/08/2015 1255   CREATININE 1.56 (H) 11/26/2023 1541   CREATININE 1.1 05/08/2015 1255    CALCIUM  9.3 11/26/2023 1541   CALCIUM  9.5 05/08/2015 1255   PROT 7.3 11/26/2023 1541   PROT 7.4 05/08/2015 1255   ALBUMIN  3.9 11/26/2023 1541   ALBUMIN  3.8 05/08/2015 1255   AST 26 11/26/2023 1541   AST 27 05/08/2015 1255   ALT 11 11/26/2023 1541   ALT 11 05/08/2015 1255   ALKPHOS 30 (L) 11/26/2023 1541   ALKPHOS 38 (L) 05/08/2015 1255   BILITOT 0.9 11/26/2023 1541   BILITOT 0.72 05/08/2015 1255   GFRNONAA 33 (L) 11/26/2023 1541   GFRAA 52 (L) 12/19/2019 1456   Lab Results  Component Value Date   CHOL 141 01/22/2023   HDL 65 01/22/2023   LDLCALC 63 01/22/2023   TRIG 64 01/22/2023   CHOLHDL 2.2 01/22/2023   No results found for: HGBA1C Lab Results  Component Value Date   VITAMINB12 269 05/17/2022   Lab Results  Component Value Date   TSH 0.814  05/16/2022      ASSESSMENT AND PLAN 83 y.o. year old female  has a past medical history of Arthritis, Atrial fibrillation (HCC), Cancer (HCC), CHF (congestive heart failure) (HCC), Dysrhythmia, Heart murmur, On home oxygen  therapy, Pulmonary hypertension (HCC), Seasonal allergies, and Thrombocytopenia (HCC) (7/16). here with:  1.  Dementia 2.  Night terrors/REM sleep behavior disorder  - MMSE 9/30 previously 15/30  - PCP recently started Namenda  -Advised that she could try melatonin 1 to 3 mg 1 hour before bedtime to see if that helps with night terrors/REM sleep behavior disorder -Follow-up in 8-10 months or sooner if needed       Duwaine Russell, MSN, NP-C 03/06/2024, 2:56 PM [facility] [address] [place], [place] [address] [phone]  The patient's condition requires frequent monitoring and adjustments in the treatment plan, reflecting the ongoing complexity of care.  This provider is the continuing focal point for all needed services for this condition.

## 2024-03-10 ENCOUNTER — Ambulatory Visit

## 2024-03-15 ENCOUNTER — Ambulatory Visit: Attending: Cardiology

## 2024-03-15 DIAGNOSIS — I4891 Unspecified atrial fibrillation: Secondary | ICD-10-CM

## 2024-03-15 DIAGNOSIS — Z5181 Encounter for therapeutic drug level monitoring: Secondary | ICD-10-CM | POA: Diagnosis not present

## 2024-03-15 LAB — POCT INR: INR: 3.8 — AB (ref 2.0–3.0)

## 2024-03-15 NOTE — Progress Notes (Signed)
 Description   INR 3.8, Skip tomorrow's dosage of Warfarin, then START taking Warfarin 1 tablet daily, except 1.5 tablets every  Monday, Wednesday and Friday. Recheck INR in 3 weeks.   Coumadin  Clinic [phone]

## 2024-03-15 NOTE — Patient Instructions (Signed)
 Description   INR 3.8, Skip tomorrow's dosage of Warfarin, then START taking Warfarin 1 tablet daily, except 1.5 tablets every  Monday, Wednesday and Friday. Recheck INR in 3 weeks.   Coumadin  Clinic [phone]

## 2024-03-26 DIAGNOSIS — I509 Heart failure, unspecified: Secondary | ICD-10-CM | POA: Diagnosis not present

## 2024-03-28 ENCOUNTER — Other Ambulatory Visit (HOSPITAL_COMMUNITY): Payer: Self-pay | Admitting: Cardiology

## 2024-03-28 DIAGNOSIS — I272 Pulmonary hypertension, unspecified: Secondary | ICD-10-CM

## 2024-03-31 DIAGNOSIS — H35341 Macular cyst, hole, or pseudohole, right eye: Secondary | ICD-10-CM | POA: Diagnosis not present

## 2024-03-31 DIAGNOSIS — H40013 Open angle with borderline findings, low risk, bilateral: Secondary | ICD-10-CM | POA: Diagnosis not present

## 2024-03-31 DIAGNOSIS — H35051 Retinal neovascularization, unspecified, right eye: Secondary | ICD-10-CM | POA: Diagnosis not present

## 2024-03-31 DIAGNOSIS — H35071 Retinal telangiectasis, right eye: Secondary | ICD-10-CM | POA: Diagnosis not present

## 2024-03-31 DIAGNOSIS — H2512 Age-related nuclear cataract, left eye: Secondary | ICD-10-CM | POA: Diagnosis not present

## 2024-04-03 ENCOUNTER — Encounter (HOSPITAL_COMMUNITY): Payer: Self-pay | Admitting: Cardiology

## 2024-04-03 ENCOUNTER — Ambulatory Visit (HOSPITAL_COMMUNITY): Payer: Self-pay | Admitting: Cardiology

## 2024-04-03 ENCOUNTER — Ambulatory Visit (HOSPITAL_COMMUNITY)
Admission: RE | Admit: 2024-04-03 | Discharge: 2024-04-03 | Disposition: A | Discharge status: Home / Self Care | Source: Ambulatory Visit | Attending: Cardiology | Admitting: Cardiology

## 2024-04-03 ENCOUNTER — Telehealth (HOSPITAL_COMMUNITY): Payer: Self-pay

## 2024-04-03 VITALS — BP 110/60 | HR 74 | Wt 135.2 lb

## 2024-04-03 DIAGNOSIS — I272 Pulmonary hypertension, unspecified: Secondary | ICD-10-CM | POA: Insufficient documentation

## 2024-04-03 DIAGNOSIS — I110 Hypertensive heart disease with heart failure: Secondary | ICD-10-CM | POA: Diagnosis not present

## 2024-04-03 DIAGNOSIS — I482 Chronic atrial fibrillation, unspecified: Secondary | ICD-10-CM | POA: Insufficient documentation

## 2024-04-03 DIAGNOSIS — Z7901 Long term (current) use of anticoagulants: Secondary | ICD-10-CM | POA: Diagnosis not present

## 2024-04-03 DIAGNOSIS — Z79899 Other long term (current) drug therapy: Secondary | ICD-10-CM | POA: Diagnosis not present

## 2024-04-03 DIAGNOSIS — E785 Hyperlipidemia, unspecified: Secondary | ICD-10-CM | POA: Diagnosis not present

## 2024-04-03 DIAGNOSIS — I5032 Chronic diastolic (congestive) heart failure: Secondary | ICD-10-CM | POA: Diagnosis not present

## 2024-04-03 DIAGNOSIS — F03B0 Unspecified dementia, moderate, without behavioral disturbance, psychotic disturbance, mood disturbance, and anxiety: Secondary | ICD-10-CM | POA: Insufficient documentation

## 2024-04-03 DIAGNOSIS — Z9981 Dependence on supplemental oxygen: Secondary | ICD-10-CM | POA: Insufficient documentation

## 2024-04-03 DIAGNOSIS — I2721 Secondary pulmonary arterial hypertension: Secondary | ICD-10-CM | POA: Diagnosis not present

## 2024-04-03 DIAGNOSIS — Z8249 Family history of ischemic heart disease and other diseases of the circulatory system: Secondary | ICD-10-CM | POA: Diagnosis not present

## 2024-04-03 LAB — CBC
HCT: 41.8 % (ref 36.0–46.0)
Hemoglobin: 12.6 g/dL (ref 12.0–15.0)
MCH: 31.3 pg (ref 26.0–34.0)
MCHC: 30.1 g/dL (ref 30.0–36.0)
MCV: 104 fL — ABNORMAL HIGH (ref 80.0–100.0)
Platelets: 102 K/uL — ABNORMAL LOW (ref 150–400)
RBC: 4.02 MIL/uL (ref 3.87–5.11)
RDW: 13.6 % (ref 11.5–15.5)
WBC: 4.6 K/uL (ref 4.0–10.5)
nRBC: 0 % (ref 0.0–0.2)

## 2024-04-03 LAB — BASIC METABOLIC PANEL WITH GFR
Anion gap: 6 (ref 5–15)
BUN: 26 mg/dL — ABNORMAL HIGH (ref 8–23)
CO2: 29 mmol/L (ref 22–32)
Calcium: 9.5 mg/dL (ref 8.9–10.3)
Chloride: 107 mmol/L (ref 98–111)
Creatinine, Ser: 1.47 mg/dL — ABNORMAL HIGH (ref 0.44–1.00)
GFR, Estimated: 35 mL/min — ABNORMAL LOW (ref >60)
Glucose, Bld: 78 mg/dL (ref 70–99)
Potassium: 4.7 mmol/L (ref 3.5–5.1)
Sodium: 142 mmol/L (ref 135–145)

## 2024-04-03 LAB — PROTIME-INR
INR: 3.8 — ABNORMAL HIGH (ref 0.8–1.2)
Prothrombin Time: 38.8 s — ABNORMAL HIGH (ref 11.4–15.2)

## 2024-04-03 LAB — BRAIN NATRIURETIC PEPTIDE: B Natriuretic Peptide: 973.7 pg/mL — ABNORMAL HIGH (ref 0.0–100.0)

## 2024-04-03 MED ORDER — APIXABAN 2.5 MG PO TABS: 2.5000 mg | ORAL_TABLET | Freq: Two times a day (BID) | ORAL | 0 refills | Status: DC

## 2024-04-03 MED ORDER — APIXABAN 2.5 MG PO TABS: 2.5000 mg | ORAL_TABLET | Freq: Two times a day (BID) | ORAL | 11 refills | Status: DC

## 2024-04-03 NOTE — Telephone Encounter (Signed)
 Called daughter Mitzie, to inform her that her mother can stop warfarin and she will start her Eliquis 2.5 mg Twice daily on Thursday 04/06/2024 as per her INR results and Tinnie Redman Pharmacist.

## 2024-04-03 NOTE — Progress Notes (Signed)
 Date:  04/03/2024   ID:  Vicki [name], DOB [birth year], MRN [id number]   Provider location:  [facility] Type of Visit: Established patient   PCP:  Clarice Nottingham, MD  HF Cardiologist:  Dr. Rolan  Chief complaint: Pulmonary hypertension   HPI: Vicki [name] is a 83 y.o. female who has a history of chronic diastolic CHF, pulmonary hypertension and chronic atrial fibrillation.  Patient was followed by Dr. Bulah in the past for chronic atrial fibrillation.  She has been on coumadin .  She reports progressive exertional dyspnea since 2011.  This gradually worsened and became quite significant over the last few months.  She used to have significant HTN, but more recently her BP has been on the lower side.  Echo was done in 2/14, showing severe concentric LVH with EF 55-60%, moderately dilated RV, moderate to severe TR, and PA systolic pressure 86 mmHg.  I did a right heart cath in 5/14.  This showed PA pressure 104/36 with PCWP 20, suggesting pulmonary arterial HTN well out of proportion to the mildly elevate wedge pressure.  She was already on amlodipine  so I did not do vasodilator testing.  V/Q scan was done, showing no evidence for chronic PEs.  PFTs showed a restrictive defect. Cardiac MRI did not show definite evidence for amyloid.  I started her on macitentan  10 mg daily.  Initially, she felt better on macitentan .  However, she was admitted in 5/14 from her sleep study due to orthopnea and dyspnea.  She was diuresed for several days and diuretic was switched over to torsemide .  I next started her on tadalafil  20 mg daily and titrated up to 40 mg daily.  She thinks that this helped.  She saw pulmonary after chest CT (showed mosaic attenuation in lungs).  This was thought to be due to air-trapping rather than ILD.  She was started on Spiriva . She wears oxygen  at home.    She had an echocardiogram in 2/15 that showed severe LVH, EF 75%, small pericardial effusion, mildly dilated  RV with mildly decreased systolic function, PA systolic pressure 84 mmHg.  She is not totally sure if she was taking macitentan  and tadalafil  at that time.  She has had a hard month.  She ran out of her macitentan  and tadalafil  and her insurance company refused to refill them.  After this, she took a decided turn for the worse.  [place] her [place] in the fall.  She became much more short of breath, just with walking around the house. She developed lightheaded spells, especially with micturation.  Given lightheadedness and presyncope, she was admitted. She was restarted on her medications and she finally got back on her meds at home.  In the hospital, she was noted to be bradycardic with HR to 30s so metoprolol  was stopped.  Of note, her abdominal fat pad biopsy did not suggest amyloidosis. Repeat RHC in 3/15 still with moderate to severe PAH and low CI.  Holter off beta blocker in 3/15 showed average HR 73.    Echo in 9/17 showed EF 65-70% with moderate to severe LVH, moderate MR, normal RV size with mildly decreased systolic function, PASP 56 mmHg, small pericardial effusion.    Echo in 10/18 showed EF 60-65% with moderate LVH, moderate MR, severe biatrial enlargement, PASP 62 mmHg, RV normal.  PYP scan in 10/18 was not suggestive of TTR amyloidosis.  Echo in 10/19 showed EF 65-70%, moderate MR, normal RV size and systolic function, PASP 43 mmHg.   Echo in 12/20 showed EF 65-70%, moderate LVH, severe biatrial enlargement, mild-moderate MR, small pericardial effusion, unable to estimate PA systolic pressure.   PYP scan in 12/20 was visually grade 2 with H/CL 1.96 but activity appeared localized to the atria. [facility] gene testing for TTR amyloidosis in 3/21 was negative.   Echo in 2/22 showed EF 60-65%, moderate LVH, normal RV, PASP 37 mmHg, moderate central MR.  PYP scan repeated 9/22 was grade 2 with H/CL 1.28 => isolated atrial uptake  again.  Echo in 5/23 showed EF 60-65%, severe concentric LVH, normal RV, PASP 49 mmHg, moderate MR, small pericardial effusion.   Admitted 05/14/22 with sepsis in the setting of flu and pneumonia. Placed on Tamiflu  and antibiotics. AHF consulted to help with HF/PH meds due to AKI. Discharged home, weight 137 lbs.  Echo in 10/24 showed EF 60-65%, moderate LVH, RV normal, severe biatrial enlargement, small-moderate pericardial effusion, moderate MR, normal IVC, PASP 45 mmHg.   She presents to clinic today for f/u. Here w/ her daughter. Has moderate dementia. Daughter helps w/ history.  Stable symptomatically, no dyspnea walking into the office.  No lightheadedness.  No chest pain.  No neuropathic symptoms in her feet. She is going to an adult day program 4 days/week. Weight up 5 lbs.   ECG: Atrial fibrillation, lateral TWIs  6 minute walk (5/14): 122 m.   6 minute walk (7/14): 152 m 6 minute walk (10/14): 183 m 6 minute walk (4/15): 317 m 6 minute walk (12/15): 259 m 6 minute walk (4/16): 293 m 6 minute walk (8/16): 341 m 6 minute walk (12/16): 274 m 6 minute walk (6/17): 314 m 6 minute walk (9/17): 307 m 6 minute walk (4/18): 366 m 6 minute walk (10/18): 320 m 6 minute walk (3/19): 381 m 6 minute walk (9/19): 366 m 6 minute walk (9/20): 305 m 6 minute walk (12/20): 243 m 6 minute walk (6/21): 304 m 6 minute walk (11/21): 323 m 6 minute walk (2/22): 305 m 6 minute walk (5/22): 366 m 6 minute walk (1/23): 366 m 6 minute walk (5/23): 314 m   Labs (07/26/22): K 3.6 Creatinine 1.46, hgb 11.6 Labs (7/24): K 4.5, creatinine 1.53, hgb 12.3 Labs (5/25):  K 4, creatinine 1.56, BNP 607, hgb 13.1   PMH: 1. Chronic diastolic CHF: Echo (2/14) with EF 55-60%, severe LVH (no SAM, no asymmetric hypertrophy, no LVOT gradient), moderate-severe LAE, moderately dilated RV with mildly decreased systolic function, moderate to severe RAE, PA systolic pressure 86 mmHg, moderate-severe TR, moderate MR,  trivial pericardial effusion.  Cardiac MRI (5/14): EF 65%, severe LVH, no definite evidence for amyloidosis (no delayed enhancement, myocardium not difficult to null).  Echo (2/15) with EF 75%, severe LVH, grade II diastolic dysfunction, moderate MR, RV mildly dilated with mildly decreased systolic function, moderate TR, PA systolic pressure 84 mmHg, small pericardial effusion. Abdominal fat pad biopsy (2/15) showed no evidence for amyloidosis.  SPEP/UPEP negative 2/15.  - Echo (4/16) with EF 60-65%, severe LVH, RV mildly dilated with mildly decreased systolic function, PA systolic pressure 40 mmHg. - Echo (9/17) with EF 65-70%, severe LVH, moderate MR, normal RV size with mildly decreased systolic function, PASP 56 mmHg, small pericardial effusion.  - Echo (10/18): EF 60-65% with moderate LVH, moderate MR, severe biatrial enlargement, PASP 62 mmHg, RV normal. - PYP scan (10/18) was not suggestive of  TTR amyloidosis.  - Echo (10/19): EF 65-70%, moderate MR, normal RV size and systolic function, PASP 43 mmHg. - Echo (12/20): EF 65-70%, moderate LVH, severe biatrial enlargement, mild-moderate MR, small pericardial effusion, unable to estimate PA systolic pressure.  - PYP scan (12/20): grade 2 with H/CL 1.96 but activity localized to the atria.  - [facility] gene testing for TTR amyloidosis in 3/21 was negative.  - Echo (2/22): EF 60-65%, moderate LVH, normal RV, PASP 37 mmHg, moderate central MR. - PYP scan (9/22): grade 2, H/CL 1.28 => isolated atrial uptake.  - Echo (5/23): EF 60-65%, severe concentric LVH, normal RV, PASP 49 mmHg, moderate MR, small pericardial effusion.  - Echo (10/24): EF 60-65%, moderate LVH, RV normal, severe biatrial enlargement, small-moderate pericardial effusion, moderate MR, normal IVC, PASP 45 mmHg.  2. Chronic atrial fibrillation since around 2004.  Developed bradycardia and metoprolol  stopped 2/15. Holter (3/15) with average HR 73, atrial fibrillation, 3.8 sec pause x 1 while  asleep, PVCs.  3. HTN: For decades.  4. LHC (2/08) with no significant disease.  5. Chronic thrombocytopenia: ITP 6. Pulmonary arterial HTN: RHC (5/14) with mean RA 13, PA 104/36 (mean 63), mean PCWP 20 on right and 23 on left, CI 2.3 (Fick) and 1.6 (thermo), PVR 10.4 WU (Fick) and 15 WU (thermo).  Vasodilator testing not done as patient was already on amlodipine .  V/Q scan (5/14) with no evidence for chronic PE.  ANA, RF, and anti-SCL70 antibody negative.  PFTs (5/14) with FEV1 60%, FVC 54%, ratio 112%, TLC 61%, DLCO 43% => restrictive defect.  Sleep study (7/14) with no OSA.  CT chest with areas of mosaic attenuation in lungs (saw pulmonary, thought air trapping and not ILD). RHC (3/15) with RA mean 5, PA 66/31 mean 43, PCWP mean 11, Cardiac Index (Fick) 1.97, PVR 9.2 WU.  Patient was started on Tyvaso  in 3/15.  Echo (4/16) with mildly dilated and mildly dysfunctional RV, PA systolic pressure 40 mmHg.  7. Chest pain: Cardiolite 11/21 with no ischemia.  8. Dementia: MRI brain 3/24 with no CVA, chronic ischemic changes.   Current Outpatient Medications  Medication Sig Dispense Refill   acetaminophen  (TYLENOL ) 500 MG tablet Take 500 mg by mouth every 6 (six) hours as needed for mild pain or headache.      Cholecalciferol  (VITAMIN D3) 2000 units TABS Take 1 tablet by mouth daily.      cyanocobalamin  (VITAMIN B12) 1000 MCG tablet Take 1 tablet (1,000 mcg total) by mouth daily. 30 tablet 0   macitentan  (OPSUMIT ) 10 MG tablet TAKE 1 TABLET (10MG ) BY MOUTH DAILY WITH BREAKFAST 30 tablet 11   memantine  (NAMENDA ) 5 MG tablet Take 10 mg by mouth daily. 1 tablet in the evening     potassium chloride  SA (KLOR-CON  M20) 20 MEQ tablet Take 1 tablet (20 mEq total) by mouth 2 (two) times daily.     rosuvastatin  (CRESTOR ) 20 MG tablet Take 20 mg by mouth at bedtime.      Selexipag  (UPTRAVI ) 600 MCG TABS Take 600 mcg by mouth in the morning and at bedtime. 180 tablet 3   SENNA PO Take by mouth as needed.      spironolactone  (ALDACTONE ) 25 MG tablet TAKE 1/2 TABLET BY MOUTH ONCE DAILY 15 tablet 10   tadalafil , PAH, (ADCIRCA ) 20 MG tablet TAKE 2 TABLETS DAILY (GENERIC FOR ADCIRCA ) 60 tablet 2   Tafamidis  (VYNDAMAX ) 61 MG CAPS Take 1 capsule by mouth daily. 90 capsule 3   torsemide  (DEMADEX )  20 MG tablet Take 1 tablet (20 mg total) by mouth daily. 90 tablet 11   apixaban (ELIQUIS) 2.5 MG TABS tablet Take 1 tablet (2.5 mg total) by mouth 2 (two) times daily. 60 tablet 0   No current facility-administered medications for this encounter.    Allergies:   Patient has no known allergies.   Social History:  The patient  reports that she has never smoked. She has never used smokeless tobacco. She reports that she does not currently use alcohol. She reports that she does not use drugs.   Family History:  The patient's family history includes Alzheimer's disease in her mother; Heart Problems in her brother and maternal aunt; Heart attack in her father; Hypertension in her brother; Pulmonary Hypertension in her cousin; Thyroid  disease in her daughter, daughter, and maternal aunt.   ROS:  Please see the history of present illness.   All other systems are personally reviewed and negative.   Recent Labs: 11/26/2023: ALT 11 04/03/2024: B Natriuretic Peptide 973.7; BUN 26; Creatinine, Ser 1.47; Hemoglobin 12.6; Platelets 102; Potassium 4.7; Sodium 142  Personally reviewed   BP 110/60   Pulse 74   Wt 61.3 kg (135 lb 3.2 oz)   SpO2 93%   BMI 23.21 kg/m   Wt Readings from Last 3 Encounters:  04/03/24 61.3 kg (135 lb 3.2 oz)  03/06/24 59.6 kg (131 lb 6.4 oz)  11/26/23 59.1 kg (130 lb 3.2 oz)   PHYSICAL EXAM: General: NAD Neck: No JVD, no thyromegaly or thyroid  nodule.  Lungs: Clear to auscultation bilaterally with normal respiratory effort. CV: Nondisplaced PMI.  Heart irregular S1/S2, no S3/S4, no murmur.  No peripheral edema.  No carotid bruit.  Normal pedal pulses.  Abdomen: Soft, nontender, no  hepatosplenomegaly, no distention.  Skin: Intact without lesions or rashes.  Neurologic: Alert and oriented x 3.  Psych: Normal affect. Extremities: No clubbing or cyanosis.  HEENT: Normal.   Assessment & Plan 1. Pulmonary HTN: Patient has severe pulmonary arterial HTN.  RHC in 3/15 showed CI 1.97, PVR 9. 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 Collagen vascular disease workup was negative (negative RF, ANA and negative anti-SCL-70).  V/Q scan was not suggestive of chronic PEs.  PFTs were suggestive of restrictive lung disease but CT chest and evaluation by pulmonary did not suggest interstitial lung disease.  Sleep study did not show OSA.  Echo in 3/22 showed normal RV size and systolic function, PASP estimation 37 mmHg.  Echo 5/23 showed EF 60-65%, severe concentric LVH, normal RV, PASP 49 mmHg, moderate MR, small pericardial effusion. Echo in 10/24 showed EF 60-65%, moderate LVH, RV normal, severe biatrial enlargement, small-moderate pericardial effusion, moderate MR, normal IVC, PASP 45 mmHg.  - Continue macitentan  and Adcirca . - Continue Uptravi  600 mcg bid, she cannot increase further due to side effects.  - I will arrange for repeat echo.  2. Chronic diastolic CHF: EF preserved on echo with moderate concentric LVH and a small pericardial effusion.  It is possible that the LVH is due to years of HTN.  The cardiac MRI was not definitively suggestive of cardiac amyloidosis.  I was still concerned for amyloidosis given the appearance of the LV myocardium on echoes.  Abdominal fat pad biopsy in 2015 showed no evidence for amyloidosis. She has some symptoms suggestive of mild peripheral neuropathy.  PYP scan in 10/18 was not suggestive of TTR amyloidosis. I assessed her again for cardiac amyloidosis in 2020: myeloma panel and urine immunofixation negative, PYP  scan in 12/20 was grade 2 with H/CL 1.97 but activity was localized to the atria.  [facility] gene testing for TTR amyloidosis was  negative.  PYP scan repeated 9/22 was grade 2 with H/CL 1.28 => isolated atrial uptake again.  Echo in 5/23 was consistent with cardiac amyloidosis, EF 60-65%, severe concentric LVH, normal RV, PASP 49 mmHg, moderate MR, small pericardial effusion. Echo in 10/24 showed EF 60-65%, moderate LVH, RV normal, severe biatrial enlargement, small-moderate pericardial effusion, moderate MR, normal IVC, PASP 45 mmHg.  Weight is up but she does not appear volume overloaded.  NYHA class II.  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 This may be an earlier manifestation before LV involvement. This can be associated with atrial fibrillation. She will continue tafamidis .   - Continue torsemide  30 qam/10 qpm.  She tried to cut back on this but developed peripheral edema and dyspnea.  - Continue Spironolactone  12.5 mg daily  - Check BMET/BNP today.  3. HTN: controlled. Soft but no orthostatic symptoms - continue current regimen  4. Chronic atrial fibrillation:  Rate controlled. She is off metoprolol  due to bradycardia. Denies syncope/ near syncope. She is on warfarin.  - Daughter says that it is hard to get her to INR checks.  I will have her stop warfarin and start apixaban 2.5 mg bid (age, creatinine > 1.5), will check INR today.  5. Hyperlipidemia: on Crestor  20 mg daily  6. Dementia: On Namenda    Followup in 3 months w/ APP   I spent 31 minutes reviewing records, interviewing/examining patient, and managing orders.   Ezra Shuck,    04/03/2024  [facility] [address] [facility] [place] [address] [phone] (office) [phone] (fax)

## 2024-04-03 NOTE — Patient Instructions (Addendum)
 STOP Warfarin ONLY AFTER WE CALL YOU WITH AND TELL YOU TO STOP  START Eliquis 2.5 mg Twice daily ONLY START AFTER WE CALL YOU AND TELL YOU TO START   Labs done today, your results will be available in MyChart, we will contact you for abnormal readings.  Your physician has requested that you have an echocardiogram. Echocardiography is a painless test that uses sound waves to create images of your heart. It provides your doctor with information about the size and shape of your heart and how well your heart's chambers and valves are working. This procedure takes approximately one hour. There are no restrictions for this procedure. Please do NOT wear cologne, perfume, aftershave, or lotions (deodorant is allowed). Please arrive 15 minutes prior to your appointment time.  Please note: We ask at that you not bring children with you during ultrasound (echo/ vascular) testing. Due to room size and safety concerns, children are not allowed in the ultrasound rooms during exams. Our front office staff cannot provide observation of children in our lobby area while testing is being conducted. An adult accompanying a patient to their appointment will only be allowed in the ultrasound room at the discretion of the ultrasound technician under special circumstances. We apologize for any inconvenience.  Your physician recommends that you schedule a follow-up appointment in: 3 months.  If you have any questions or concerns before your next appointment please send us  a message through [facility] or call our office at [phone].    TO LEAVE A MESSAGE FOR THE NURSE SELECT OPTION 2, PLEASE LEAVE A MESSAGE INCLUDING: YOUR NAME DATE OF BIRTH CALL BACK NUMBER REASON FOR CALL**this is important as we prioritize the call backs  YOU WILL RECEIVE A CALL BACK THE SAME DAY AS LONG AS YOU CALL BEFORE 4:00 PM  At the [facility], you and your health needs are our priority. As part of our continuing mission  to provide you with exceptional heart care, we have created designated Provider Care Teams. These Care Teams include your primary Cardiologist (physician) and Advanced Practice Providers (APPs- Physician Assistants and Nurse Practitioners) who all work together to provide you with the care you need, when you need it.   You may see any of the following providers on your designated Care Team at your next follow up: Dr Toribio Fuel Dr Ezra Shuck Dr. Ria Commander Dr. Morene Brownie Amy Lenetta, NP Caffie Shed, [place] [facility] [place], [place] Beckey Coe, NP [place] Lee, NP Ellouise Class, NP Tinnie Redman, PharmD Jaun Bash, PharmD   Please be sure to bring in all your medications bottles to every appointment.    Thank you for choosing [facility]

## 2024-04-07 ENCOUNTER — Ambulatory Visit

## 2024-04-07 ENCOUNTER — Other Ambulatory Visit (HOSPITAL_COMMUNITY): Payer: Self-pay

## 2024-04-07 MED ORDER — APIXABAN 2.5 MG PO TABS: 2.5000 mg | ORAL_TABLET | Freq: Two times a day (BID) | ORAL | 0 refills | Status: AC

## 2024-04-10 DIAGNOSIS — H401121 Primary open-angle glaucoma, left eye, mild stage: Secondary | ICD-10-CM | POA: Diagnosis not present

## 2024-04-10 DIAGNOSIS — Z961 Presence of intraocular lens: Secondary | ICD-10-CM | POA: Diagnosis not present

## 2024-04-10 DIAGNOSIS — H35071 Retinal telangiectasis, right eye: Secondary | ICD-10-CM | POA: Diagnosis not present

## 2024-04-10 DIAGNOSIS — H401112 Primary open-angle glaucoma, right eye, moderate stage: Secondary | ICD-10-CM | POA: Diagnosis not present

## 2024-04-10 DIAGNOSIS — H25812 Combined forms of age-related cataract, left eye: Secondary | ICD-10-CM | POA: Diagnosis not present

## 2024-04-13 DIAGNOSIS — G301 Alzheimer's disease with late onset: Secondary | ICD-10-CM | POA: Diagnosis not present

## 2024-04-13 DIAGNOSIS — F514 Sleep terrors [night terrors]: Secondary | ICD-10-CM | POA: Diagnosis not present

## 2024-04-13 DIAGNOSIS — R350 Frequency of micturition: Secondary | ICD-10-CM | POA: Diagnosis not present

## 2024-04-13 DIAGNOSIS — I50812 Chronic right heart failure: Secondary | ICD-10-CM | POA: Diagnosis not present

## 2024-04-18 ENCOUNTER — Other Ambulatory Visit (HOSPITAL_COMMUNITY): Payer: Self-pay | Admitting: Internal Medicine

## 2024-04-18 ENCOUNTER — Other Ambulatory Visit (HOSPITAL_COMMUNITY): Payer: Self-pay | Admitting: Cardiology

## 2024-04-19 ENCOUNTER — Ambulatory Visit (HOSPITAL_COMMUNITY)
Admission: RE | Admit: 2024-04-19 | Discharge: 2024-04-19 | Disposition: A | Discharge status: Home / Self Care | Source: Ambulatory Visit | Attending: Internal Medicine | Admitting: Internal Medicine

## 2024-04-19 ENCOUNTER — Other Ambulatory Visit (HOSPITAL_COMMUNITY): Payer: Self-pay

## 2024-04-19 DIAGNOSIS — I272 Pulmonary hypertension, unspecified: Secondary | ICD-10-CM | POA: Insufficient documentation

## 2024-04-19 DIAGNOSIS — I4891 Unspecified atrial fibrillation: Secondary | ICD-10-CM | POA: Diagnosis not present

## 2024-04-19 DIAGNOSIS — I358 Other nonrheumatic aortic valve disorders: Secondary | ICD-10-CM | POA: Diagnosis not present

## 2024-04-19 DIAGNOSIS — I517 Cardiomegaly: Secondary | ICD-10-CM | POA: Diagnosis not present

## 2024-04-19 DIAGNOSIS — E785 Hyperlipidemia, unspecified: Secondary | ICD-10-CM | POA: Insufficient documentation

## 2024-04-19 DIAGNOSIS — I5032 Chronic diastolic (congestive) heart failure: Secondary | ICD-10-CM | POA: Diagnosis not present

## 2024-04-19 DIAGNOSIS — I499 Cardiac arrhythmia, unspecified: Secondary | ICD-10-CM | POA: Insufficient documentation

## 2024-04-19 DIAGNOSIS — I340 Nonrheumatic mitral (valve) insufficiency: Secondary | ICD-10-CM | POA: Insufficient documentation

## 2024-04-19 DIAGNOSIS — I3139 Other pericardial effusion (noninflammatory): Secondary | ICD-10-CM | POA: Diagnosis not present

## 2024-04-19 LAB — ECHOCARDIOGRAM COMPLETE
AR max vel: 1.49 cm2
AV Area VTI: 1.4 cm2
AV Area mean vel: 1.44 cm2
AV Mean grad: 6 mmHg
AV Peak grad: 12.7 mmHg
Ao pk vel: 1.78 m/s
Area-P 1/2: 3.99 cm2
Est EF: >75
S' Lateral: 2.69 cm

## 2024-04-21 ENCOUNTER — Other Ambulatory Visit (HOSPITAL_COMMUNITY): Payer: Self-pay

## 2024-04-25 DIAGNOSIS — I509 Heart failure, unspecified: Secondary | ICD-10-CM | POA: Diagnosis not present

## 2024-04-28 ENCOUNTER — Other Ambulatory Visit: Payer: Self-pay

## 2024-05-02 ENCOUNTER — Other Ambulatory Visit (HOSPITAL_COMMUNITY): Payer: Self-pay

## 2024-05-04 ENCOUNTER — Other Ambulatory Visit: Payer: Self-pay

## 2024-05-05 ENCOUNTER — Other Ambulatory Visit: Payer: Self-pay | Admitting: Pharmacy Technician

## 2024-05-05 ENCOUNTER — Other Ambulatory Visit: Payer: Self-pay

## 2024-05-05 NOTE — Progress Notes (Signed)
 Specialty Pharmacy Refill Coordination Note  LILAC HOFF is a 83 y.o. female contacted today regarding refills of specialty medication(s) Tafamidis  (Vyndamax )  Spoke with Daughter  Patient requested Delivery   Delivery date: 05/09/24   Verified address: [address] [name]   Medication will be filled on: 05/08/24

## 2024-05-05 NOTE — Progress Notes (Signed)
 Clinical Intervention Note  Clinical Intervention Notes: Patient's daughter reported that patient had changed from warfarin to Eliquis. No DDIs identified with Vyndamax    Clinical Intervention Outcomes: Prevention of an adverse drug event   [employer]vertising Account Planner

## 2024-05-05 NOTE — Progress Notes (Signed)
 Specialty Pharmacy Ongoing Clinical Assessment Note  Vicki [name] is a 83 y.o. female who is being followed by the specialty pharmacy service for RxSp Cardiology   Patient's specialty medication(s) reviewed today: Tafamidis  (Vyndamax )   Missed doses in the last 4 weeks: 0   Patient/Caregiver did not have any additional questions or concerns.   Therapeutic benefit summary: Patient is achieving benefit   Adverse events/side effects summary: No adverse events/side effects   Patient's therapy is appropriate to: Continue    Goals Addressed             This Visit's Progress    Stabilization of disease   On track    Patient is on track. Patient will maintain adherence         Follow up: 12 months  [facility]

## 2024-05-08 ENCOUNTER — Other Ambulatory Visit: Payer: Self-pay

## 2024-05-17 ENCOUNTER — Other Ambulatory Visit (HOSPITAL_COMMUNITY): Payer: Self-pay | Admitting: Cardiology

## 2024-05-26 DIAGNOSIS — I509 Heart failure, unspecified: Secondary | ICD-10-CM | POA: Diagnosis not present

## 2024-06-16 ENCOUNTER — Other Ambulatory Visit (HOSPITAL_COMMUNITY): Payer: Self-pay | Admitting: Cardiology

## 2024-06-19 NOTE — Telephone Encounter (Signed)
 Attempted to call, overdue for follow-up last seen 03/15/24. LMOM TCB for appt on Laurie daughter's phone (on [place]).

## 2024-06-20 NOTE — Telephone Encounter (Signed)
 [facility] has eliquis  2.5mg  on it; per 04/03/24 OV note it states:  Chronic atrial fibrillation:  Rate controlled. She is off metoprolol  due to bradycardia. Denies syncope/ near syncope. She is on warfarin.  - Daughter says that it is hard to get her to INR checks.  I will have her stop warfarin and start apixaban  2.5 mg bid (age, creatinine > 1.5), will check INR today.  04/03/24 Telephone note states: Called daughter Mitzie, to inform her that her mother can stop warfarin and she will start her Eliquis  2.5 mg Twice daily on Thursday 04/06/2024 as per her INR results and Tinnie Redman Pharmacist.   Will deny refill at this time.

## 2024-06-20 NOTE — Telephone Encounter (Signed)
 Left message for them to call back regarding an appointment.

## 2024-06-25 DIAGNOSIS — I509 Heart failure, unspecified: Secondary | ICD-10-CM | POA: Diagnosis not present

## 2024-06-30 ENCOUNTER — Other Ambulatory Visit (HOSPITAL_COMMUNITY): Payer: Self-pay | Admitting: Cardiology

## 2024-06-30 DIAGNOSIS — I272 Pulmonary hypertension, unspecified: Secondary | ICD-10-CM

## 2024-07-03 ENCOUNTER — Telehealth (HOSPITAL_COMMUNITY): Payer: Self-pay

## 2024-07-03 NOTE — Progress Notes (Signed)
 "    [facility]  PCP:  Clarice Nottingham, MD  HF Cardiologist:  Dr. Rolan   HPI: Vicki [name] is a 84 y.o. female who has a history of chronic diastolic CHF, pulmonary hypertension and chronic atrial fibrillation.  Patient was followed by Dr. Bulah in the past for chronic atrial fibrillation.  She has been on coumadin .  She reports progressive exertional dyspnea since 2011.  This gradually worsened and became quite significant over the last few months.  She used to have significant HTN, but more recently her BP has been on the lower side.  Echo was done in 2/14, showing severe concentric LVH with EF 55-60%, moderately dilated RV, moderate to severe TR, and PA systolic pressure 86 mmHg.  I did a right heart cath in 5/14.  This showed PA pressure 104/36 with PCWP 20, suggesting pulmonary arterial HTN well out of proportion to the mildly elevate wedge pressure.  She was already on amlodipine  so I did not do vasodilator testing.  V/Q scan was done, showing no evidence for chronic PEs.  PFTs showed a restrictive defect. Cardiac MRI did not show definite evidence for amyloid.  I started her on macitentan  10 mg daily.  Initially, she felt better on macitentan .  However, she was admitted in 5/14 from her sleep study due to orthopnea and dyspnea.  She was diuresed for several days and diuretic was switched over to torsemide .  I next started her on tadalafil  20 mg daily and titrated up to 40 mg daily.  She thinks that this helped.  She saw pulmonary after chest CT (showed mosaic attenuation in lungs).  This was thought to be due to air-trapping rather than ILD.  She was started on Spiriva . She wears oxygen  at home.    She had an echocardiogram in 2/15 that showed severe LVH, EF 75%, small pericardial effusion, mildly dilated RV with mildly decreased systolic function, PA systolic pressure 84 mmHg.  She is not totally sure if she was taking macitentan  and tadalafil  at that time.  She has had a hard  month.  She ran out of her macitentan  and tadalafil  and her insurance company refused to refill them.  After this, she took a decided turn for the worse.  [place] in the fall.  She became much more short of breath, just with walking around the house. She developed lightheaded spells, especially with micturation.  Given lightheadedness and presyncope, she was admitted. She was restarted on her medications and she finally got back on her meds at home.  In the hospital, she was noted to be bradycardic with HR to 30s so metoprolol  was stopped.  Of note, her abdominal fat pad biopsy did not suggest amyloidosis. Repeat RHC in 3/15 still with moderate to severe PAH and low CI.  Holter off beta blocker in 3/15 showed average HR 73.    Echo in 9/17 showed EF 65-70% with moderate to severe LVH, moderate MR, normal RV size with mildly decreased systolic function, PASP 56 mmHg, small pericardial effusion.    Echo in 10/18 showed EF 60-65% with moderate LVH, moderate MR, severe biatrial enlargement, PASP 62 mmHg, RV normal.  PYP scan in 10/18 was not suggestive of TTR amyloidosis.   Echo in 10/19 showed EF 65-70%, moderate MR, normal RV size and systolic function, PASP 43 mmHg.   Echo in 12/20 showed EF 65-70%, moderate LVH, severe biatrial  enlargement, mild-moderate MR, small pericardial effusion, unable to estimate PA systolic pressure.   PYP scan in 12/20 was visually grade 2 with H/CL 1.96 but activity appeared localized to the atria. [employer] gene testing for TTR amyloidosis in 3/21 was negative.   Echo in 2/22 showed EF 60-65%, moderate LVH, normal RV, PASP 37 mmHg, moderate central MR.  PYP scan repeated 9/22 was grade 2 with H/CL 1.28 => isolated atrial uptake again.  Echo in 5/23 showed EF 60-65%, severe concentric LVH, normal RV, PASP 49 mmHg, moderate MR, small pericardial effusion.   Admitted 05/14/22 with sepsis in the setting  of flu and pneumonia. Placed on Tamiflu  and antibiotics. AHF consulted to help with HF/PH meds due to AKI. Discharged home, weight 137 lbs.  Echo in 10/24 showed EF 60-65%, moderate LVH, RV normal, severe biatrial enlargement, small-moderate pericardial effusion, moderate MR, normal IVC, PASP 45 mmHg.   Echo 10/25 EF >75%, severe LVH with hyperdynamic LV function, RV mildly reduced, moderate pericardial effusion, mild MR, normal IVC.  Today she returns for HF follow up. She has moderate dementia and her daughter helps w/ history. Overall feeling fine. She is able to walk on flat ground and do ADLs without dyspnea. Denies palpitations, abnormal bleeding, CP, dizziness, edema, or PND/Orthopnea. Appetite ok. Weight at home stable. Taking all medications. She is going to an adult day program at [employer]. She lives with her son.  ECG (personally reviewed): none ordered today.  6 minute walk (5/14): 122 m.   6 minute walk (7/14): 152 m 6 minute walk (10/14): 183 m 6 minute walk (4/15): 317 m 6 minute walk (12/15): 259 m 6 minute walk (4/16): 293 m 6 minute walk (8/16): 341 m 6 minute walk (12/16): 274 m 6 minute walk (6/17): 314 m 6 minute walk (9/17): 307 m 6 minute walk (4/18): 366 m 6 minute walk (10/18): 320 m 6 minute walk (3/19): 381 m 6 minute walk (9/19): 366 m 6 minute walk (9/20): 305 m 6 minute walk (12/20): 243 m 6 minute walk (6/21): 304 m 6 minute walk (11/21): 323 m 6 minute walk (2/22): 305 m 6 minute walk (5/22): 366 m 6 minute walk (1/23): 366 m 6 minute walk (5/23): 314 m   Labs (07/26/22): K 3.6 Creatinine 1.46, hgb 11.6 Labs (7/24): K 4.5, creatinine 1.53, hgb 12.3 Labs (5/25):  K 4, creatinine 1.56, BNP 607, hgb 13.1 Labs (10/25): K 4.7, creatinine 1.47   PMH: 1. Chronic diastolic CHF: Echo (2/14) with EF 55-60%, severe LVH (no SAM, no asymmetric hypertrophy, no LVOT gradient), moderate-severe LAE, moderately dilated RV with mildly decreased systolic function,  moderate to severe RAE, PA systolic pressure 86 mmHg, moderate-severe TR, moderate MR, trivial pericardial effusion.  Cardiac MRI (5/14): EF 65%, severe LVH, no definite evidence for amyloidosis (no delayed enhancement, myocardium not difficult to null).  Echo (2/15) with EF 75%, severe LVH, grade II diastolic dysfunction, moderate MR, RV mildly dilated with mildly decreased systolic function, moderate TR, PA systolic pressure 84 mmHg, small pericardial effusion. Abdominal fat pad biopsy (2/15) showed no evidence for amyloidosis.  SPEP/UPEP negative 2/15.  - Echo (4/16) with EF 60-65%, severe LVH, RV mildly dilated with mildly decreased systolic function, PA systolic pressure 40 mmHg. - Echo (9/17) with EF 65-70%, severe LVH, moderate MR, normal RV size with mildly decreased systolic function, PASP 56 mmHg, small pericardial effusion.  - Echo (10/18): EF 60-65% with moderate LVH, moderate MR, severe biatrial enlargement, PASP 62 mmHg, RV  normal. - PYP scan (10/18) was not suggestive of TTR amyloidosis.  - Echo (10/19): EF 65-70%, moderate MR, normal RV size and systolic function, PASP 43 mmHg. - Echo (12/20): EF 65-70%, moderate LVH, severe biatrial enlargement, mild-moderate MR, small pericardial effusion, unable to estimate PA systolic pressure.  - PYP scan (12/20): grade 2 with H/CL 1.96 but activity localized to the atria.  - Invitae gene testing for TTR amyloidosis in 3/21 was negative.  - Echo (2/22): EF 60-65%, moderate LVH, normal RV, PASP 37 mmHg, moderate central MR. - PYP scan (9/22): grade 2, H/CL 1.28 => isolated atrial uptake.  - Echo (5/23): EF 60-65%, severe concentric LVH, normal RV, PASP 49 mmHg, moderate MR, small pericardial effusion.  - Echo (10/24): EF 60-65%, moderate LVH, RV normal, severe biatrial enlargement, small-moderate pericardial effusion, moderate MR, normal IVC, PASP 45 mmHg.  - Echo (10/25): EF >75%, severe LVH with hyperdynamic LV function, RV mildly reduced, moderate  pericardial effusion, mild MR, normal IVC. 2. Chronic atrial fibrillation since around 2004.  Developed bradycardia and metoprolol  stopped 2/15. Holter (3/15) with average HR 73, atrial fibrillation, 3.8 sec pause x 1 while asleep, PVCs.  3. HTN: For decades.  4. LHC (2/08) with no significant disease.  5. Chronic thrombocytopenia: ITP 6. Pulmonary arterial HTN: RHC (5/14) with mean RA 13, PA 104/36 (mean 63), mean PCWP 20 on right and 23 on left, CI 2.3 (Fick) and 1.6 (thermo), PVR 10.4 WU (Fick) and 15 WU (thermo).  Vasodilator testing not done as patient was already on amlodipine .  V/Q scan (5/14) with no evidence for chronic PE.  ANA, RF, and anti-SCL70 antibody negative.  PFTs (5/14) with FEV1 60%, FVC 54%, ratio 112%, TLC 61%, DLCO 43% => restrictive defect.  Sleep study (7/14) with no OSA.  CT chest with areas of mosaic attenuation in lungs (saw pulmonary, thought air trapping and not ILD). RHC (3/15) with RA mean 5, PA 66/31 mean 43, PCWP mean 11, Cardiac Index (Fick) 1.97, PVR 9.2 WU.  Patient was started on Tyvaso  in 3/15.  Echo (4/16) with mildly dilated and mildly dysfunctional RV, PA systolic pressure 40 mmHg.  7. Chest pain: Cardiolite 11/21 with no ischemia.  8. Dementia: MRI brain 3/24 with no CVA, chronic ischemic changes.   Current Outpatient Medications  Medication Sig Dispense Refill   acetaminophen  (TYLENOL ) 500 MG tablet Take 500 mg by mouth every 6 (six) hours as needed for mild pain or headache.      apixaban  (ELIQUIS ) 2.5 MG TABS tablet Take 1 tablet (2.5 mg total) by mouth 2 (two) times daily. 28 tablet 0   Cholecalciferol  (VITAMIN D3) 2000 units TABS Take 1 tablet by mouth daily.      cyanocobalamin  (VITAMIN B12) 1000 MCG tablet Take 1 tablet (1,000 mcg total) by mouth daily. 30 tablet 0   macitentan  (OPSUMIT ) 10 MG tablet TAKE 1 TABLET (10MG ) BY MOUTH DAILY WITH BREAKFAST 30 tablet 11   potassium chloride  SA (KLOR-CON  M20) 20 MEQ tablet Take 1 tablet (20 mEq total) by  mouth 2 (two) times daily.     rosuvastatin  (CRESTOR ) 20 MG tablet Take 20 mg by mouth at bedtime.      Selexipag  (UPTRAVI ) 600 MCG TABS Take 600 mcg by mouth in the morning and at bedtime. 180 tablet 3   SENNA PO Take by mouth as needed.     spironolactone  (ALDACTONE ) 25 MG tablet TAKE 1/2 TABLET BY MOUTH ONCE DAILY 15 tablet 11   tadalafil , PAH, (ADCIRCA )  20 MG tablet TAKE 2 TABLETS DAILY (GENERIC FOR ADCIRCA ) 60 tablet 2   Tafamidis  (VYNDAMAX ) 61 MG CAPS Take 1 capsule by mouth daily. 90 capsule 3   torsemide  (DEMADEX ) 20 MG tablet Take 1 tablet (20 mg total) by mouth daily. (Patient taking differently: Take 40 mg by mouth daily. 1.5 tablets in the morning and 0.5 tablets in the afternoon due to access fluid) 90 tablet 11   memantine  (NAMENDA ) 5 MG tablet Take 10 mg by mouth daily. 1 tablet in the evening (Patient not taking: Reported on 07/04/2024)     No current facility-administered medications for this encounter.    Allergies:   Patient has no known allergies.   Social History:  The patient  reports that she has never smoked. She has never used smokeless tobacco. She reports that she does not currently use alcohol. She reports that she does not use drugs.   Family History:  The patient's family history includes Alzheimer's disease in her mother; Heart Problems in her brother and maternal aunt; Heart attack in her father; Hypertension in her brother; Pulmonary Hypertension in her cousin; Thyroid  disease in her daughter, daughter, and maternal aunt.   ROS:  Please see the history of present illness.   All other systems are personally reviewed and negative.   Wt Readings from Last 3 Encounters:  07/04/24 59.9 kg (132 lb)  04/03/24 61.3 kg (135 lb 3.2 oz)  03/06/24 59.6 kg (131 lb 6.4 oz)   BP 120/78   Pulse (!) 52   Wt 59.9 kg (132 lb)   SpO2 97%   BMI 22.66 kg/m   PHYSICAL EXAM: General:  NAD. No resp difficulty, walked into clinic, elderly HEENT: Normal Neck: Supple. No  JVD. Cor: Irregular rate & rhythm. No rubs, gallops or murmurs. Lungs: Clear Abdomen: Soft, nontender, nondistended.  Extremities: No cyanosis, clubbing, rash, edema Neuro: Alert & oriented x 3, moves all 4 extremities w/o difficulty. Affect pleasant.  Assessment & Plan 1. Pulmonary HTN: Patient has severe pulmonary arterial HTN.  RHC in 3/15 showed CI 1.97, PVR 9. 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 Collagen vascular disease workup was negative (negative RF, ANA and negative anti-SCL-70).  V/Q scan was not suggestive of chronic PEs.  PFTs were suggestive of restrictive lung disease but CT chest and evaluation by pulmonary did not suggest interstitial lung disease.  Sleep study did not show OSA.  Echo in 3/22 showed normal RV size and systolic function, PASP estimation 37 mmHg.  Echo 5/23 showed EF 60-65%, severe concentric LVH, normal RV, PASP 49 mmHg, moderate MR, small pericardial effusion. Echo in 10/24 showed EF 60-65%, moderate LVH, RV normal, severe biatrial enlargement, small-moderate pericardial effusion, moderate MR, normal IVC, PASP 45 mmHg. Echo 10/25 EF >75%, severe LVH with hyperdynamic LV function, RV mildly reduced, moderate pericardial effusion. - Continue macitentan  and Adcirca . - Continue Uptravi  600 mcg bid, she cannot increase further due to side effects.  2. Chronic diastolic CHF: EF preserved on echo with moderate concentric LVH and a small pericardial effusion.  It is possible that the LVH is due to years of HTN.  The cardiac MRI was not definitively suggestive of cardiac amyloidosis.  Dr. Rolan was still concerned for amyloidosis given the appearance of the LV myocardium on echoes.  Abdominal fat pad biopsy in 2015 showed no evidence for amyloidosis. She has some symptoms suggestive of mild peripheral neuropathy.  PYP scan in 10/18 was not suggestive of TTR amyloidosis. She was assessed again  for cardiac amyloidosis in 2020: myeloma panel and urine immunofixation  negative, PYP scan in 12/20 was grade 2 with H/CL 1.97 but activity was localized to the atria.  Inv[facility]ae gene testing for TTR amyloidosis was negative.  PYP scan repeated 9/22 was grade 2 with H/CL 1.28 => isolated atrial uptake again.  Echo in 5/23 was consistent with cardiac amyloidosis, EF 60-65%, severe concentric LVH, normal RV, PASP 49 mmHg, moderate MR, small pericardial effusion. Echo in 10/24 showed EF 60-65%, moderate LVH, RV normal, severe biatrial enlargement, small-moderate pericardial effusion, moderate MR, normal IVC, PASP 45 mmHg. Echo 10/25 EF >75%, severe LVH with hyperdynamic LV function, RV mildly reduced, moderate pericardial effusion, mild MR, normal IVC. NYHA class II. She is not volume overloaded. 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 This may be an earlier manifestation before LV involvement. This can be associated with atrial fibrillation. She will continue tafamidis .   - Continue torsemide  30 qam/10 qpm.  She tried to cut back on this but developed peripheral edema and dyspnea.  - Continue spironolactone  12.5 mg daily. BMET today.  3. HTN: BP well-controlled.  - Continue current regimen  4. Chronic atrial fibrillation:  Rate controlled. She is off metoprolol  due to bradycardia. Denies syncope/ near syncope.  - Previously on warfarin, now transitioned to Eliquis  2.5 mg bid. No bleeding issues. CBC today. 5. Hyperlipidemia: on Crestor  20 mg daily  6. Dementia: Previously on Namenda    Follow up in 4 months with Dr. Rolan Vicki [name],    07/04/2024  [facility] [address] [facility] [place] [address] [phone] (office) [phone] (fax) "

## 2024-07-03 NOTE — Telephone Encounter (Signed)
 Called and spoke to pt's Daughter Mitzie to confirm/remind patient of their appointment at the [facility] on 07/04/24.   Appointment:   [x] Confirmed  [] Left mess   [] No answer/No voice mail  [] VM Full/unable to leave message  [] Phone not in service  Patient reminded to bring all medications and/or complete list.  Confirmed patient has transportation. Gave directions, instructed to utilize valet parking.

## 2024-07-04 ENCOUNTER — Ambulatory Visit (HOSPITAL_COMMUNITY)
Admission: RE | Admit: 2024-07-04 | Discharge: 2024-07-04 | Disposition: A | Discharge status: Home / Self Care | Source: Ambulatory Visit | Attending: Family Medicine | Admitting: Family Medicine

## 2024-07-04 ENCOUNTER — Other Ambulatory Visit (HOSPITAL_COMMUNITY): Payer: Self-pay

## 2024-07-04 ENCOUNTER — Encounter (HOSPITAL_COMMUNITY): Payer: Self-pay

## 2024-07-04 ENCOUNTER — Ambulatory Visit (HOSPITAL_COMMUNITY): Payer: Self-pay | Admitting: Family Medicine

## 2024-07-04 VITALS — BP 120/78 | HR 52 | Wt 132.0 lb

## 2024-07-04 DIAGNOSIS — I10 Essential (primary) hypertension: Secondary | ICD-10-CM | POA: Diagnosis not present

## 2024-07-04 DIAGNOSIS — I272 Pulmonary hypertension, unspecified: Secondary | ICD-10-CM | POA: Insufficient documentation

## 2024-07-04 DIAGNOSIS — Z7901 Long term (current) use of anticoagulants: Secondary | ICD-10-CM | POA: Diagnosis not present

## 2024-07-04 DIAGNOSIS — I482 Chronic atrial fibrillation, unspecified: Secondary | ICD-10-CM | POA: Insufficient documentation

## 2024-07-04 DIAGNOSIS — Z9981 Dependence on supplemental oxygen: Secondary | ICD-10-CM | POA: Insufficient documentation

## 2024-07-04 DIAGNOSIS — I110 Hypertensive heart disease with heart failure: Secondary | ICD-10-CM | POA: Insufficient documentation

## 2024-07-04 DIAGNOSIS — E785 Hyperlipidemia, unspecified: Secondary | ICD-10-CM | POA: Diagnosis not present

## 2024-07-04 DIAGNOSIS — F03B0 Unspecified dementia, moderate, without behavioral disturbance, psychotic disturbance, mood disturbance, and anxiety: Secondary | ICD-10-CM | POA: Diagnosis not present

## 2024-07-04 DIAGNOSIS — I5032 Chronic diastolic (congestive) heart failure: Secondary | ICD-10-CM | POA: Insufficient documentation

## 2024-07-04 DIAGNOSIS — I3139 Other pericardial effusion (noninflammatory): Secondary | ICD-10-CM | POA: Diagnosis not present

## 2024-07-04 DIAGNOSIS — Z79899 Other long term (current) drug therapy: Secondary | ICD-10-CM | POA: Diagnosis not present

## 2024-07-04 LAB — BASIC METABOLIC PANEL WITH GFR
Anion gap: 9 (ref 5–15)
BUN: 20 mg/dL (ref 8–23)
CO2: 28 mmol/L (ref 22–32)
Calcium: 9.8 mg/dL (ref 8.9–10.3)
Chloride: 105 mmol/L (ref 98–111)
Creatinine, Ser: 1.34 mg/dL — ABNORMAL HIGH (ref 0.44–1.00)
GFR, Estimated: 39 mL/min — ABNORMAL LOW
Glucose, Bld: 76 mg/dL (ref 70–99)
Potassium: 4.2 mmol/L (ref 3.5–5.1)
Sodium: 143 mmol/L (ref 135–145)

## 2024-07-04 LAB — CBC
HCT: 43.9 % (ref 36.0–46.0)
Hemoglobin: 13.8 g/dL (ref 12.0–15.0)
MCH: 32.1 pg (ref 26.0–34.0)
MCHC: 31.4 g/dL (ref 30.0–36.0)
MCV: 102.1 fL — ABNORMAL HIGH (ref 80.0–100.0)
Platelets: 158 K/uL (ref 150–400)
RBC: 4.3 MIL/uL (ref 3.87–5.11)
RDW: 13.4 % (ref 11.5–15.5)
WBC: 3.8 K/uL — ABNORMAL LOW (ref 4.0–10.5)
nRBC: 0 % (ref 0.0–0.2)

## 2024-07-04 NOTE — Patient Instructions (Signed)
 There has been no changes to your medications.  Labs done today, your results will be available in MyChart, we will contact you for abnormal readings.  Your physician recommends that you schedule a follow-up appointment in: 4 months ( May 2026) ** PLEASE CALL THE OFFICE IN MARCH TO ARRANGE YOUR FOLLOW UP APPOINTMENT.**  If you have any questions or concerns before your next appointment please send us  a message through [place] or call our office at [phone].    TO LEAVE A MESSAGE FOR THE NURSE SELECT OPTION 2, PLEASE LEAVE A MESSAGE INCLUDING: YOUR NAME DATE OF BIRTH CALL BACK NUMBER REASON FOR CALL**this is important as we prioritize the call backs  YOU WILL RECEIVE A CALL BACK THE SAME DAY AS LONG AS YOU CALL BEFORE 4:00 PM  At the [facility], you and your health needs are our priority. As part of our continuing mission to provide you with exceptional heart care, we have created designated Provider Care Teams. These Care Teams include your primary Cardiologist (physician) and Advanced Practice Providers (APPs- Physician Assistants and Nurse Practitioners) who all work together to provide you with the care you need, when you need it.   You may see any of the following providers on your designated Care Team at your next follow up: Dr Toribio Fuel Dr Ezra Shuck Dr. Morene Brownie Greig Mosses, NP Caffie Shed, [place] [facility] [place], [place] Beckey Coe, NP Jordan Lee, NP Ellouise Class, NP Tinnie Redman, PharmD Jaun Bash, PharmD   Please be sure to bring in all your medications bottles to every appointment.    Thank you for choosing  [facility]

## 2024-07-07 ENCOUNTER — Encounter: Payer: Self-pay | Admitting: Podiatry

## 2024-07-07 ENCOUNTER — Ambulatory Visit: Admitting: Podiatry

## 2024-07-07 DIAGNOSIS — M79672 Pain in left foot: Secondary | ICD-10-CM | POA: Diagnosis not present

## 2024-07-07 DIAGNOSIS — M79671 Pain in right foot: Secondary | ICD-10-CM | POA: Diagnosis not present

## 2024-07-07 DIAGNOSIS — B351 Tinea unguium: Secondary | ICD-10-CM | POA: Diagnosis not present

## 2024-07-07 NOTE — Progress Notes (Signed)
 Patient presents for evaluation and treatment of tenderness and some redness around nails feet.  Tenderness around toes with walking and wearing shoes.  Physical exam:  General appearance: Alert, pleasant, and in no acute distress.  Vascular: Pedal pulses: DP 2/4 B/L, PT 0/4 B/L. Mild edema lower legs bilaterally.  Capillary refill time immediate bilaterally  Neurologic:  Dermatologic:  Nails thickened, disfigured, discolored 1-5 BL with subungual debris.  Redness and hypertrophic nail folds along nail folds bilaterally but no signs of drainage or infection.  Musculoskeletal:     Diagnosis: 1. Painful onychomycotic nails 1 through 5 bilaterally. 2. Pain toes 1 through 5 bilaterally.  Plan: -Debrided onychomycotic nails 1 through 5 bilaterally.  Sharply debrided nails with nail clipper and reduced with a power bur.  Return 3 months RFC

## 2024-07-10 ENCOUNTER — Other Ambulatory Visit (HOSPITAL_COMMUNITY): Payer: Self-pay

## 2024-07-12 ENCOUNTER — Other Ambulatory Visit (HOSPITAL_COMMUNITY): Payer: Self-pay

## 2024-07-20 ENCOUNTER — Other Ambulatory Visit (HOSPITAL_COMMUNITY): Payer: Self-pay

## 2024-07-20 ENCOUNTER — Telehealth (HOSPITAL_COMMUNITY): Payer: Self-pay

## 2024-07-20 NOTE — Telephone Encounter (Signed)
 Advanced [facility] Failure Patient Advocate Encounter  Prior authorization for Opsumit  has been submitted and approved. Test billing returns $0 for 30 day supply.  KeyBETHA BOAST Effective: 07/20/2024 to 06/28/2025  Rachel DEL, CPhT Rx Patient Advocate Phone: [phone]

## 2024-07-28 ENCOUNTER — Other Ambulatory Visit: Payer: Self-pay

## 2024-07-28 ENCOUNTER — Telehealth (HOSPITAL_COMMUNITY): Payer: Self-pay

## 2024-07-28 ENCOUNTER — Other Ambulatory Visit (HOSPITAL_COMMUNITY): Payer: Self-pay

## 2024-07-31 ENCOUNTER — Telehealth (HOSPITAL_COMMUNITY): Payer: Self-pay | Admitting: Pharmacist

## 2024-07-31 NOTE — Telephone Encounter (Signed)
 Advanced [facility] Failure Patient Advocate Encounter  Prior authorization for Vyndamax  has been submitted and approved. Test billing returns $0 for 30 day supply.  KeyBETHA LOCKWOOD Effective: 07/31/2024 to 06/28/2025  Rachel DEL, CPhT Rx Patient Advocate Phone: [phone]

## 2024-07-31 NOTE — Telephone Encounter (Signed)
 Patient Advocate Encounter   Received notification from [employer] Part [employer] that prior authorization for Vyndamax  is required.   PA submitted on CoverMyMeds Key [id number] Status is pending   Will continue to follow.   Tinnie Redman, PharmD, BCPS, BCCP, CPP Heart Failure Clinic Pharmacist [phone]

## 2024-08-01 ENCOUNTER — Other Ambulatory Visit (HOSPITAL_COMMUNITY): Payer: Self-pay

## 2024-08-01 NOTE — Telephone Encounter (Signed)
 Advanced [facility] Failure Patient Advocate Encounter  Prior Authorization for Vyndamax  has been approved.    Effective dates: 08/01/24 through 06/28/25  Tinnie Redman, PharmD, BCPS, BCCP, CPP [facility] Pharmacist [phone]

## 2024-08-02 ENCOUNTER — Other Ambulatory Visit (HOSPITAL_COMMUNITY): Payer: Self-pay

## 2024-08-04 ENCOUNTER — Other Ambulatory Visit (HOSPITAL_COMMUNITY): Payer: Self-pay

## 2024-10-05 ENCOUNTER — Ambulatory Visit: Admitting: Podiatry

## 2024-12-25 ENCOUNTER — Ambulatory Visit: Admitting: Adult Health
